# Patient Record
Sex: Female | Born: 1960 | Race: White | Hispanic: No | Marital: Married | Smoking: Former smoker
Health system: Southern US, Community
[De-identification: ages and names within clinical notes are randomized; demographics above are authoritative.]

## PROBLEM LIST (undated history)

## (undated) DIAGNOSIS — F419 Anxiety disorder, unspecified: Secondary | ICD-10-CM

## (undated) DIAGNOSIS — I639 Cerebral infarction, unspecified: Secondary | ICD-10-CM

## (undated) DIAGNOSIS — J449 Chronic obstructive pulmonary disease, unspecified: Secondary | ICD-10-CM

## (undated) DIAGNOSIS — C801 Malignant (primary) neoplasm, unspecified: Secondary | ICD-10-CM

## (undated) DIAGNOSIS — F329 Major depressive disorder, single episode, unspecified: Secondary | ICD-10-CM

## (undated) DIAGNOSIS — R112 Nausea with vomiting, unspecified: Secondary | ICD-10-CM

## (undated) DIAGNOSIS — I219 Acute myocardial infarction, unspecified: Secondary | ICD-10-CM

## (undated) DIAGNOSIS — I739 Peripheral vascular disease, unspecified: Secondary | ICD-10-CM

## (undated) DIAGNOSIS — D649 Anemia, unspecified: Secondary | ICD-10-CM

## (undated) DIAGNOSIS — R519 Headache, unspecified: Secondary | ICD-10-CM

## (undated) DIAGNOSIS — F32A Depression, unspecified: Secondary | ICD-10-CM

## (undated) DIAGNOSIS — K559 Vascular disorder of intestine, unspecified: Secondary | ICD-10-CM

## (undated) DIAGNOSIS — I6529 Occlusion and stenosis of unspecified carotid artery: Secondary | ICD-10-CM

## (undated) DIAGNOSIS — I2510 Atherosclerotic heart disease of native coronary artery without angina pectoris: Secondary | ICD-10-CM

## (undated) DIAGNOSIS — I209 Angina pectoris, unspecified: Secondary | ICD-10-CM

## (undated) DIAGNOSIS — K2970 Gastritis, unspecified, without bleeding: Secondary | ICD-10-CM

## (undated) DIAGNOSIS — I25810 Atherosclerosis of coronary artery bypass graft(s) without angina pectoris: Secondary | ICD-10-CM

## (undated) DIAGNOSIS — G939 Disorder of brain, unspecified: Secondary | ICD-10-CM

## (undated) DIAGNOSIS — M359 Systemic involvement of connective tissue, unspecified: Secondary | ICD-10-CM

## (undated) DIAGNOSIS — K219 Gastro-esophageal reflux disease without esophagitis: Secondary | ICD-10-CM

## (undated) DIAGNOSIS — R51 Headache: Secondary | ICD-10-CM

## (undated) DIAGNOSIS — B3781 Candidal esophagitis: Secondary | ICD-10-CM

## (undated) DIAGNOSIS — N189 Chronic kidney disease, unspecified: Secondary | ICD-10-CM

## (undated) DIAGNOSIS — K922 Gastrointestinal hemorrhage, unspecified: Secondary | ICD-10-CM

## (undated) DIAGNOSIS — I10 Essential (primary) hypertension: Secondary | ICD-10-CM

## (undated) DIAGNOSIS — IMO0001 Reserved for inherently not codable concepts without codable children: Secondary | ICD-10-CM

## (undated) DIAGNOSIS — K529 Noninfective gastroenteritis and colitis, unspecified: Secondary | ICD-10-CM

## (undated) HISTORY — DX: Atherosclerosis of coronary artery bypass graft(s) without angina pectoris: I25.810

## (undated) HISTORY — DX: Disorder of brain, unspecified: G93.9

## (undated) HISTORY — DX: Candidal esophagitis: B37.81

## (undated) HISTORY — PX: NOSE SURGERY: SHX723

## (undated) HISTORY — PX: ABDOMINAL HYSTERECTOMY: SHX81

## (undated) HISTORY — DX: Occlusion and stenosis of unspecified carotid artery: I65.29

## (undated) HISTORY — PX: CORONARY ARTERY BYPASS GRAFT: SHX141

## (undated) HISTORY — DX: Peripheral vascular disease, unspecified: I73.9

## (undated) HISTORY — PX: STENT PLACEMENT VASCULAR (ARMC HX): HXRAD1737

## (undated) HISTORY — DX: Vascular disorder of intestine, unspecified: K55.9

## (undated) HISTORY — PX: CORONARY ANGIOPLASTY: SHX604

## (undated) HISTORY — DX: Essential (primary) hypertension: I10

## (undated) HISTORY — DX: Gastritis, unspecified, without bleeding: K29.70

## (undated) HISTORY — DX: Nausea with vomiting, unspecified: R11.2

## (undated) HISTORY — DX: Gastrointestinal hemorrhage, unspecified: K92.2

## (undated) HISTORY — PX: BACK SURGERY: SHX140

## (undated) HISTORY — DX: Noninfective gastroenteritis and colitis, unspecified: K52.9

## (undated) LAB — HM MAMMOGRAPHY: HM Mammogram: NEGATIVE

## (undated) SURGERY — CAROTID PTA/STENT INTERVENTION
Anesthesia: Moderate Sedation | Laterality: Left

---

## 2005-10-10 NOTE — Unmapped (Signed)
Signed by Harless Litten DO on 10/10/2005 at 00:00:00  Urinalysis      Imported By: Evette Doffing 03/31/2009 10:55:29    _____________________________________________________________________    External Attachment:    Please see [employer] EMR for this document.

## 2006-06-15 NOTE — Unmapped (Signed)
Signed by Antonieta Loveless MD on 06/15/2006 at 00:00:00  [facility]      Imported By: Coletta Memos 08/01/2006 10:54:56    _____________________________________________________________________    External Attachment:    Please see [employer] EMR for this document.

## 2006-06-18 NOTE — Unmapped (Signed)
Signed by Harless Litten DO on 06/18/2006 at 00:00:00  EEG LABS AND EPILEPSY MONITORING UNIT       Imported By: Evette Doffing 04/12/2009 10:30:02    _____________________________________________________________________    External Attachment:    Please see [employer] EMR for this document.
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
## 2006-07-18 NOTE — Unmapped (Signed)
Signed by   LinkLogic on 07/25/2006 at 16:46:37  Patient: Tracie Romero  Note: All result statuses are Final unless otherwise noted.    Tests: (1)  (MR)    Order Note:                                      THE [facility]     PATIENT NAME:   Tracie Romero, Tracie Romero                MR #:  [id number]  DATE OF BIRTH:  January 22, 1961                         ACCOUNT #:  [id number]  VISIT DATE:     07/18/2006                         ROOM #:  5430  PRIMARY:        Helane Rima, D.O.            NURSING UNIT:  [id number]  REFERRING:      Jeani Hawking, M.D.               [facility]:  G  DICTATED BY:    Amy B. Renato Gails, M.D.                  ADMIT DATE:  07/17/2006  TECH INITIALS:  JR                                 DISCHARGE DATE:  07/20/2006  READ BY:        Amy B. Renato Gails, M.D.                                  CAROTID DUPLEX STUDY     INDICATIONS:  This is a 45 year old female with a history of CVA.     FINDINGS:  Brachial systolic pressure was 145 mmHg bilaterally.  Color duplex imaging of  the vertebral arteries reveals normal antegrade flow bilaterally.  B-mode  imaging of the carotid bifurcations reveals a moderate amount of plaque  formation bilaterally.  Spectral waveform analysis reveals severe spectral  broadening bilaterally.  Doppler velocimetric data analysis reveals a peak  systolic velocity of 153 cm/sec on the right and 269 cm/sec on the left with  end-diastolic velocities of 80 cm/sec on the right and 145 cm/sec on the left.     IMPRESSION:  1. Normal antegrade flow in the vertebral arteries bilaterally.  2. Low-end severe (50-79%) right internal carotid artery stenosis.  3. Critical (greater than 80-99%) left internal carotid artery stenosis based     on velocity criteria.  However, plaque formation on the left is moderate     in nature when the photos are reviewed.  4. Critical result called to Dr. Dorian Furnace on 07/18/06 at 8:24 p.m., read     back and verified.         ________________________________________  ABR/kbc                               ____  D:  07/24/2006 09:27  Amy B. Renato Gails, M.D.  T:  07/25/2006 16:32  Job #:  [id number]                                     CAROTID DUPLEX STUDY                                        COPY                   PAGE    1 of 1    Note: An exclamation mark (!) indicates a result that was not dispersed into   the flowsheet.  Document Creation Date: 07/25/2006 4:46 PM  _______________________________________________________________________    (1) Order result status: Final  Collection or observation date-time: 07/18/2006 00:00  Requested date-time:   Receipt date-time:   Reported date-time:   Referring Physician: Jeani Hawking  Ordering Physician:  Reviewed In Hospital [facility])  Specimen Source:   Source: DBS  Filler Order Number: [id number]  Lab site:
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
## 2006-07-20 NOTE — Unmapped (Signed)
Signed by Antonieta Loveless MD on 07/20/2006 at 00:00:00  Neurology      Imported By: Coletta Memos 08/01/2006 10:16:10    _____________________________________________________________________    External Attachment:    Please see [employer] EMR for this document.
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
## 2006-07-24 NOTE — Unmapped (Signed)
Signed by Tawanna Sat MA on 07/24/2006 at 16:11:40    Neurology Preload      Preload Clinical Lists   Medications added:   PLAVIX 75 MG TABS (CLOPIDOGREL BISULFATE) 1 qd  ASPIRIN 81 MG TBEC (ASPIRIN) 1 qd  EFFEXOR XR 75 MG CP24 (VENLAFAXINE HCL) 1 qd  PRINIVIL 10 MG TABS (LISINOPRIL) 1 qd  FOLIC ACID 1 MG TABS (FOLIC ACID) 1 qd  NEXIUM 40 MG CPDR (ESOMEPRAZOLE MAGNESIUM) 1 qd  ISOSORBIDE MONONITRATE CR 60 MG TB24 (ISOSORBIDE MONONITRATE) 1 qd  AMLODIPINE BESYLATE 5 MG TABS (AMLODIPINE BESYLATE) 1 qd  ROXICODONE 5 MG TABS (OXYCODONE HCL) prn  XANAX 1 MG TABS (ALPRAZOLAM) 1 qd        Coordinating Care Providers   PCP Name: Ian Bushman, MD    Past History  Social History: Marital Status: married,   Employment Status: disabled,   Patient Lives at: home,   Support System: excellent  Caffeine per Day: 4+  Seatbelt Use: 100 % of the time  Alcohol Use: none  Drug Use: none  Tobacco Usage:smoker  Cigarettes-Packs per Day- .5,             ]

## 2006-07-25 NOTE — Unmapped (Signed)
THE [facility]     PATIENT NAME:   Tracie Romero, Tracie Romero                MR #:  [id number]   DATE OF BIRTH:  07-17-1961                         ACCOUNT #:  [id number]   VISIT DATE:     07/18/2006                         ROOM #:  [id number]   PRIMARY:        Helane Rima, D.O.            NURSING UNIT:  U5NW   REFERRING:      Jeani Hawking, M.D.               [facility]:  G   DICTATED BY:    Dakhari Zuver B. Renato Gails, M.D.                  ADMIT DATE:  07/17/2006   TECH INITIALS:  JR                                 DISCHARGE DATE:    07/20/2006   READ BY:        Mandrell Vangilder B. Renato Gails, M.D.                                  CAROTID DUPLEX STUDY     INDICATIONS:   This is a 45 year old female with a history of CVA.     FINDINGS:   Brachial systolic pressure was 145 mmHg bilaterally.  Color duplex imaging of   the vertebral arteries reveals normal antegrade flow bilaterally.  B-mode   imaging of the carotid bifurcations reveals a moderate amount of plaque   formation bilaterally.  Spectral waveform analysis reveals severe spectral   broadening bilaterally.  Doppler velocimetric data analysis reveals a peak   systolic velocity of 153 cm/sec on the right and 269 cm/sec on the left with   end-diastolic velocities of 80 cm/sec on the right and 145 cm/sec on the   left.     IMPRESSION:   1. Normal antegrade flow in the vertebral arteries bilaterally.   2. Low-end severe (50-79%) right internal carotid artery stenosis.   3. Critical (greater than 80-99%) left internal carotid artery stenosis based      on velocity criteria.  However, plaque formation on the left is moderate      in nature when the photos are reviewed.   4. Critical result called to Dr. Dorian Furnace on 07/18/06 at 8:24 p.m., read      back and verified.                                               ________________________________________   ABR/kbc                               ____   D:  07/24/2006 09:27  Khai Arrona B. Renato Gails, M.D.   T:  07/25/2006  16:32   Job #:  [id number]                                    CAROTID DUPLEX STUDY                                         COPY                   PAGE    1 of 1   D:  07/24/2006 09:27                  Edenilson Austad B. Renato Gails, M.D.   T:  07/25/2006 16:32   Job #:  [id number]                                    CAROTID DUPLEX STUDY                                         COPY                   PAGE    1 of 1
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
## 2006-07-27 ENCOUNTER — Inpatient Hospital Stay: Attending: Surgical Critical Care

## 2006-07-31 NOTE — Unmapped (Signed)
Signed by Antonieta Loveless MD on 07/31/2006 at 00:00:00  Transferred Records       Imported By: Coletta Memos 07/31/2006 14:41:01    _____________________________________________________________________    External Attachment:    Please see [employer] EMR for this document.
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
## 2006-08-10 NOTE — Unmapped (Signed)
Signed by Colbert Ewing MD on 08/10/2006 at 00:00:00  [facility]      Imported By: Maryellen Pile 09/13/2006 11:55:11    _____________________________________________________________________    External Attachment:    Please see [facility] EMR for this document.

## 2006-08-17 ENCOUNTER — Inpatient Hospital Stay: Attending: Vascular Surgery

## 2006-08-17 NOTE — Unmapped (Signed)
Signed by   LinkLogic on 08/18/2006 at 12:30:24  Patient: Fiza Swantek  Note: All result statuses are Final unless otherwise noted.    Tests: (1)  (MR)    Order Note:                                      THE [facility]     PATIENT NAME:   ARDENA, GANGL                MR #:  [id number]  DATE OF BIRTH:  1961-03-09                         ACCOUNT #:  [id number]  VISIT DATE:     08/17/2006                         ROOM #:  PRIMARY:        Helane Rima, D.O.            NURSING UNIT:  REFERRING:      Amy B. Renato Gails, M.D.                  [name]  DICTATED BY:    Denny Peon, M.D.             ADMIT DATE:  08/17/2006  TECH INITIALS:  DA/CP                              DISCHARGE DATE:  READ BY:        Denny Peon, M.D.                                  CAROTID DUPLEX STUDY     HISTORY:  This is a 45 year old woman with bilateral carotid stenosis.     FINDINGS:  Brachial pressure is 122 mmHg on the right and 119 mmHg on the  left.     B-mode ultrasound reveals a large amount of nonechogenic plaque in both  carotid bulbs and proximal internal carotid arteries.  Doppler flow analysis  demonstrates a peak systolic velocity of 103 cm/second with an end-diastolic  velocity of 46 cm/second and severe spectral broadening in the right internal  carotid artery.  In the left internal carotid artery the peak systolic  velocity is 175 cm/second with an end-diastolic velocity of 86 cm/second and  severe spectral broadening. There is antegrade flow in both vertebral  arteries.     IMPRESSION:     1. Moderate right internal carotid artery stenosis in the 16-49% range.  2. Severe left internal carotid artery stenosis in the 50-79% range.                                              ________________________________________  JSG/kll                               ____  D:  08/17/2006 15:32  Denny Peon, M.D.  T:  08/18/2006 12:27  Job #:  [id number]                                     CAROTID DUPLEX  STUDY                                        COPY                   PAGE    1 of 1    Note: An exclamation mark (!) indicates a result that was not dispersed into   the flowsheet.  Document Creation Date: 08/18/2006 12:30 PM  _______________________________________________________________________    (1) Order result status: Final  Collection or observation date-time: 08/17/2006 00:00  Requested date-time:   Receipt date-time:   Reported date-time:   Referring Physician: Colbert Ewing  Ordering Physician:  Reviewed In Hospital [facility])  Specimen Source:   Source: DBS  Filler Order Number: [id number]  Lab site:

## 2006-08-18 NOTE — Unmapped (Signed)
THE [facility]     PATIENT NAME:   Tracie Romero, Tracie Romero                MR #:  [id number]   DATE OF BIRTH:  June 26, 1961                         ACCOUNT #:  [id number]   VISIT DATE:     08/17/2006                         ROOM #:   PRIMARY:        Helane Rima, D.O.            NURSING UNIT:   REFERRING:      Amy B. Renato Gails, M.D.                  [name]   DICTATED BY:    Denny Peon, M.D.             ADMIT DATE:  08/17/2006   TECH INITIALS:  DA/CP                              DISCHARGE DATE:   READ BY:        Denny Peon, M.D.                                  CAROTID DUPLEX STUDY     HISTORY:  This is a 45 year old woman with bilateral carotid stenosis.     FINDINGS:  Brachial pressure is 122 mmHg on the right and 119 mmHg on the   left.     B-mode ultrasound reveals a large amount of nonechogenic plaque in both   carotid bulbs and proximal internal carotid arteries.  Doppler flow analysis   demonstrates a peak systolic velocity of 103 cm/second with an end-diastolic   velocity of 46 cm/second and severe spectral broadening in the right internal   carotid artery.  In the left internal carotid artery the peak systolic   velocity is 175 cm/second with an end-diastolic velocity of 86 cm/second and   severe spectral broadening. There is antegrade flow in both vertebral   arteries.     IMPRESSION:     1. Moderate right internal carotid artery stenosis in the 16-49% range.   2. Severe left internal carotid artery stenosis in the 50-79% range.                                               ________________________________________   JSG/kll                               ____   D:  08/17/2006 15:32                  Denny Peon, M.D.   T:  08/18/2006 12:27   Job #:  [id number]                                    CAROTID  DUPLEX STUDY                                         COPY                   PAGE    1 of 1                                         COPY                   PAGE    1 of  1

## 2006-08-30 NOTE — Unmapped (Signed)
Signed by Wende Bushy LPN on 16/07/9603 at 16:30:48    General Preload      Preload Clinical Lists   Problems added:   HYPERLIPIDEMIA (ICD-272.4)  CAROTID ARTERY DISEASE (ICD-433.10)  HYPERTENSION (ICD-401.9)  DEPRESSION (ICD-311)  CAD (ICD-414.00)  HEADACHE (ICD-784.0)  SPELLS (ICD-780.39)    Medications added:   PLAVIX 75 MG TABS (CLOPIDOGREL BISULFATE) 1 qd  ASPIRIN 81 MG TBEC (ASPIRIN) 1 qd  EFFEXOR XR 75 MG CP24 (VENLAFAXINE HCL) 1 qd  PRINIVIL 10 MG TABS (LISINOPRIL) 1 qd  FOLIC ACID 1 MG TABS (FOLIC ACID) 1 qd  NEXIUM 40 MG CPDR (ESOMEPRAZOLE MAGNESIUM) 1 qd  ISOSORBIDE MONONITRATE CR 60 MG TB24 (ISOSORBIDE MONONITRATE) 1 qd  AMLODIPINE BESYLATE 5 MG TABS (AMLODIPINE BESYLATE) 1 qd  XANAX 1 MG TABS (ALPRAZOLAM) 1 qd  LIPITOR 80 MG TABS (ATORVASTATIN CALCIUM) daily  TRICOR 145 MG TABS (FENOFIBRATE) qd  ZETIA 10 MG TABS (EZETIMIBE) daily  NITROGLYCERIN SUBL (NITROGLYCERIN SUBL) prn      Observations Recorded during this update  Added new observation of SOCIAL HX: Marital Status: married,   Children: 2,   Employment Status: disabled,   Patient Lives at: home,   Support System: excellent  Caffeine per Day: 4+  Seatbelt Use: 100 % of the time  Alcohol Use: none  Drug Use: none  Tobacco Usage:smoker  Cigarettes-Packs per Day- .5,    (08/30/2006 16:21)  Added new observation of # CHILDREN: 2  (08/30/2006 16:21)  Added new observation of PAST SURG HX:  Coronary Stent: x6, 1 stent in left arm      (08/30/2006 16:21)  Added new observation of CARDSRGOTHER: 1 stent in left arm  (08/30/2006 16:21)  Added new observation of INTRCORSTENT: x6  (08/30/2006 16:21)  Added new observation of PAST MED HX:  Coronary Artery Disease, Hyperlipidemia, Hypertension, Depression    (08/30/2006 16:21)  Added new observation of PSYCH HX: Depression  (08/30/2006 16:21)  Added new observation of PMH CARD: Coronary Artery Disease, Hyperlipidemia, Hypertension  (08/30/2006 16:21)  Added new observation of NKA: T  (08/30/2006  16:21)  Past History  Past Medical History:  Coronary Artery Disease, Hyperlipidemia, Hypertension, Depression  Surgical History:  Coronary Stent: x6, 1 stent in left arm    Social History: Marital Status: married,   Children: 2,   Employment Status: disabled,   Patient Lives at: home,   Support System: excellent  Caffeine per Day: 4+  Seatbelt Use: 100 % of the time  Alcohol Use: none  Drug Use: none  Tobacco Usage:smoker  Cigarettes-Packs per Day- .5,       OTHER PHYSICIANS INVOLVED IN PATIENT CARE   PCP Name: Ian Bushman, MD  Cardiologist: Dr. Matthias Hughs      [facility] Rectal Coordinating Care Providers:   PCP: Ian Bushman, MD

## 2006-09-07 NOTE — Unmapped (Signed)
Signed by Colbert Ewing MD on 09/07/2006 at 23:58:03    VASCULAR SURGERY FOLLOW-UP VISIT    Chief Complaint: F/U Carotid Stenosis    History of Present Illness   Stable from traumatic splenic injury. Had CTA and carotid duplex for questionable high grade carotid stenosis on the left. Denies stroke, amaurosis, TIA. Has constant numbness of R arm and headaches. Legs ache at times with sitting long periods of time.      Past History  Social History: Marital Status: married,   Children: 2,   Employment Status: disabled,   Patient Lives at: home,   Support System: excellent  Caffeine per Day: 4+  Seatbelt Use: 100 % of the time  Alcohol Use: none  Drug Use: none  Tobacco Usage:smoker  Cigarettes-Packs per Day- .5,     Review of Systems   General: Complains of daytime somnolence, not resting well, sweats. Denies anorexia, chills, fatigue, fevers, malaise, morning fatigue, weight loss, weight gain. insomnia  Cardiac: Complains of dizziness, dyspnea on exertion, dyspnea at rest, palpitations, presyncope. Denies chest pains, peripheral edema, PND, orthopnea.     Medications   PLAVIX 75 MG TABS (CLOPIDOGREL BISULFATE) 1 qd  ASPIRIN 81 MG TBEC (ASPIRIN) 1 qd  EFFEXOR XR 75 MG CP24 (VENLAFAXINE HCL) 1 qd  PRINIVIL 10 MG TABS (LISINOPRIL) 1 qd  FOLIC ACID 1 MG TABS (FOLIC ACID) 1 qd  NEXIUM 40 MG CPDR (ESOMEPRAZOLE MAGNESIUM) 1 qd  ISOSORBIDE MONONITRATE CR 60 MG TB24 (ISOSORBIDE MONONITRATE) 1 qd  AMLODIPINE BESYLATE 5 MG TABS (AMLODIPINE BESYLATE) 1 qd  XANAX 0.5 MG TABS (ALPRAZOLAM) one by mouth at bedtime  LIPITOR 80 MG TABS (ATORVASTATIN CALCIUM) daily  TRICOR 145 MG TABS (FENOFIBRATE) qd  ZETIA 10 MG TABS (EZETIMIBE) daily  NITROGLYCERIN SUBL (NITROGLYCERIN SUBL) 0.4mg  as needed    Allergies  No Known Allergies    Dominant Hand: Right    Vital Signs   Height: 61 in.  Weight: 164 lbs.   Blood Pressure   BP #1: 122 / 80mm Hg  Cuff Size: Std     Intake recorded by: Earlie Counts LPN  September 07, 2006 11:13 AM      Physical  Examination  Neck: supple, no adenopathy, no masses, normal carotid pulses, left carotid bruit.   Carotid Pulse- Right: normal.   Carotid Pulse- Left: normal.   Radial Pulse- Left: normal.   Brachial Pulse- Right: normal.   Femoral Pulse- Right: diminished.   Femoral Pulse- Left: diminished.   Posterior Tibial Pulse- Right: normal.   Posterior Tibial Pulse- Left: normal.   Dorsalis Pedis Pulse- Right: normal.   Dorsalis Pedis Pulse- Left: normal.   Neuro: normal, cranial nerves II-XII intact, sensation intact, motor intact.   Psych: affect and mood appropriate, normal interaction.    Doppler flow analysis  demonstrates a peak systolic velocity of 103 cm/second with an end-diastolic  velocity of 46 cm/second and severe spectral broadening in the right internal  carotid artery.  In the left internal carotid artery the peak systolic  velocity is 175 cm/second with an end-diastolic velocity of 86 cm/second and  severe spectral broadening. There is antegrade flow in both vertebral  arteries.    Assessment and Plan   45 y/o female with carotid stenosis of 50% on L and 30% on R. Continue ECASA and statin. Given her premature atherosclerosis and strong family history of CAD, I have emphasized complete tobacco cessation. Will check carotid duplex and exercise ABIs in 6 months.  Plan  Orders for today's visit  1. Ordered 99214 - Ofc Vst, Est Level IV [CPT-99214]    Additional Plan  exercise LEA in 6 months  Carotid in 6 months    DISPOSITION:    Return to clinic in 6 month(s)     cc:   Ian Bushman, MD  Jac[facility] Loge  Dr. Marda Stalker

## 2006-09-18 NOTE — Unmapped (Signed)
Signed by Colbert Ewing MD on 09/18/2006 at 00:00:00  Outside Medical Records      Imported By: Maryellen Pile 09/18/2006 09:15:20    _____________________________________________________________________    External Attachment:    Please see [employer] EMR for this document.

## 2006-10-02 NOTE — Unmapped (Addendum)
Signed by Tracie Romero on 10/03/2006 at 13:44:29        [facility]      ,     [phone]  Fax: [phone]             September 07, 2006                RE: Tracie Romero   DOB:  1961-03-09      Dear Tracie Romero, M.D.,    I had the pleasure of seeing your patient, Tracie Romero, at your request for surgical evaluation in our Vascular Surgery office. Please see my Assessment and Plan below as well as the attached office note.    46 y/o female with carotid stenosis of 50% on L and 30% on R. Continue ECASA and statin. Given her premature atherosclerosis and strong family history of CAD, I have emphasized complete tobacco cessation. Will check carotid duplex and exercise ABIs in 6 months.    Thank you for allowing me to see this patient.  I will keep you informed of their progress.    Best personal regards,        Tracie Bath B. Renato Gails, MD, FACS  Assistant Professor of Surgery  Division of Vascular Surgery  [email or web address]        CC  Tracie Bushman, MD  Tracie Romero  Tracie Romero      Signed by Tracie Romero on 10/03/2006 at 13:46:59    Faxed letter to Dr. Boone Master, Dr. Matthias Hughs, and Dr. Orlin Hilding

## 2006-10-02 NOTE — Unmapped (Signed)
Signed by Gershon Crane on 10/03/2006 at 13:44:29        [facility]      ,     [phone]  Fax: [phone]           September 07, 2006                RE: Tracie Romero   DOB:  October 21, 1960      Dear Sherilyn Dacosta, MD,    I had the pleasure of seeing your patient, Tracie Romero, at your request for surgical evaluation in our Vascular Surgery office. Please see my Assessment and Plan below as well as the attached office note.    46 y/o female with carotid stenosis of 50% on L and 30% on R. Continue ECASA and statin. Given her premature atherosclerosis and strong family history of CAD, I have emphasized complete tobacco cessation. Will check carotid duplex and exercise ABIs in 6 months.    Thank you for allowing me to see this patient.  I will keep you informed of their progress.    Best personal regards,        Weronika Birch B. Renato Gails, MD, FACS  Assistant Professor of Surgery  Division of Vascular Surgery  [email or web address]        CC  Ian Bushman, MD  [facility]  Dr. Marda Stalker

## 2006-10-08 NOTE — Unmapped (Signed)
Signed by Colbert Ewing MD on 10/08/2006 at 00:00:00  [facility]      Imported By: Maryellen Pile 11/08/2006 16:37:25    _____________________________________________________________________    External Attachment:    Please see [employer] EMR for this document.

## 2006-10-24 NOTE — Unmapped (Signed)
Signed by Harless Litten DO on 10/24/2006 at 00:00:00  [facility]       Imported By: Evette Doffing 06/11/2009 13:01:22    _____________________________________________________________________    External Attachment:    Please see [employer] EMR for this document.

## 2006-11-07 NOTE — Unmapped (Signed)
Signed by Colbert Ewing MD on 11/07/2006 at 00:00:00  [facility]      Imported By: Coletta Memos 12/26/2006 09:51:33    _____________________________________________________________________    External Attachment:    Please see [employer] EMR for this document.

## 2006-12-24 NOTE — Unmapped (Signed)
Signed by   [employer]Log[employer] on 12/25/2006 at 13:24:10  Patient: Tracie Romero  Note: All result statuses are Final unless otherwise noted.    Tests: (1) CT-HEAD W/O CONTRAST ([id number])    Order NotePricilla Handler Order Number: [id number]    Non-EMR Ordering Provider: Darol Destine     Order Note:     *** VERIFIED ***  [facility]  Reason:  SLURRED SPEECH, CHEST PAIN, BACK PAIN, R/O DISSECTION  Dict.Staff: [address] [place] [id number]    Verified By: Candis Shine D        Ver: 12/25/06   1:23 pm  Exams:  CT-HEAD W/O CONTRAST  CT-CHEST W/WO CONTRAST      Dictated 12/24/06 at 13:30    12/24/06  CT OF THE BRAIN WITHOUT CONTRAST:    HISTORY:  Slurred speech.    COMPARISON:  10/26/06 question intracranial hemorrhage.    Images obtained through the brain without intravenous contrast.  The ventricles and basilar cisterns are within normal limits.  There is no acute intraaxial or extraaxial hemorrhage.    IMPRESSION:    NO ACUTE  INTRAAXIAL OR EXTRAAXIAL HEMORRHAGE.      CT OF THE CHEST:    HISTORY:  Back pain, question dissection.    COMPARISON:  CTPA chest 10/27/06.    Images obtained of the chest without and with intravenous  contrast, 100 mL of Isovue-370 was administered intravenously.    There is a stent within the left subclavian artery.    There is no intramural hematoma on the noncontrasted images.    Post administration of contrast demonstrates no aneurysm of the  thoracic aorta.  There is no evidence for dissection.  There is  some mild calcification of the thoracic aorta.  The left  subclavian artery is grossly patent.    There is severe hilar or mediastinal lymphadenopathy.  There are  no pericardial or pleural effusions.    Thyroid gland is unremarkable.    There is dependent bibasilar atelectasis.    There are some mild emphysematous changes most pronounced in the  upper lungs.    There is no focal consolidation.    There is retained content which is radiodense within the  stomach, indeterminate and is new since the CT  examination  10/27/06.    IMPRESSION:    1.  NO EVIDENCE FOR ACUTE AORTIC SYNDROME.    2.  NO FOCAL CONSOLIDATION.      /pab  **** end of result ****    Note: An exclamation mark (!) indicates a result that was not dispersed into   the flowsheet.  Document Creation Date: 12/25/2006 1:24 PM  _______________________________________________________________________    (1) Order result status: Final  Collection or observation date-time: 12/24/2006 13:18:05  Requested date-time: 12/24/2006 12:49:00  Receipt date-time:   Reported date-time: 12/25/2006 13:23:47  Referring Physician: Sharmon Leyden  Ordering Physician:  Non-EMR Physician [facility])  Specimen Source:   Source: QRS  Filler Order Number: [id number]  Lab site: [facility]

## 2006-12-24 NOTE — Unmapped (Signed)
Signed by   LinkLogic on 12/25/2006 at 07:02:45  Patient: Tracie Romero  Note: All result statuses are Final unless otherwise noted.    Tests: (1) DIAG-CHEST PA OR AP [phone])    Order NotePricilla Handler Order Number: [id number]    Non-EMR Ordering Provider: Darol Destine     Order Note:     *** VERIFIED ***  [facility]  Reason:  CP  Dict.Staff: Tammi Sou [phone]    Verified By: Tammi Sou       Ver: 12/25/06   7:02 am  Exams:  DIAG-CHEST PA OR AP        Dictated 12/24/06 at 11:59    12/24/06  PORTABLE AP CHEST AT 1140 HOURS:    CLINICAL HISTORY:  Shortness of breath and chest pain.    Comparison study is 10/26/06.    No pneumothorax.  Heart, hila, and mediastinum nonremarkable.  Lungs are clear.  No CHF.    IMPRESSION:    NORMAL EXAM.    /ldm  **** end of result ****    Note: An exclamation mark (!) indicates a result that was not dispersed into   the flowsheet.  Document Creation Date: 12/25/2006 7:02 AM  _______________________________________________________________________    (1) Order result status: Final  Collection or observation date-time: 12/24/2006 11:48:00  Requested date-time: 12/24/2006 11:41:00  Receipt date-time:   Reported date-time: 12/25/2006 07:02:33  Referring Physician: Sharmon Leyden  Ordering Physician:  Non-EMR Physician [facility])  Specimen Source:   Source: QRS  Filler Order Number: [id number]  Lab site: [facility]

## 2006-12-24 NOTE — Unmapped (Signed)
Signed by   LinkLogic on 12/24/2006 at 15:07:48  Patient: Tracie Romero  Note: All result statuses are Final unless otherwise noted.    Tests: (1)  (MR)    Order Note:                                     [facility]     PATIENT NAME:   LEZLEE, GILLS               MR #:  [id number]  DATE OF BIRTH:  09-23-1961                        ACCOUNT #:  [id number]  ED PHYSICIAN:   Samuel Bouche, M.D.               ROOM #:  [id number]  PRIMARY:        Helane Rima, D.O.           NURSING UNIT:  [facility]  REFERRING:      Narda Amber, M.D.   [facility]:  C  DICTATED BY:    Samuel Bouche, M.D.               ADMIT DATE:  12/24/2006  VISIT DATE:                                       DISCHARGE DATE:                           EMERGENCY DEPARTMENT ADMISSION NOTE     CHIEF COMPLAINT:  Chest pain, slurred speech.     HISTORY OF PRESENT ILLNESS:  This is a 46 year old female with known history  of cardiac disease as well as known history of carotid stenosis and a history  of TIAs who presents with the above complaints.  The patient reports chest  pain, dull, substernal, radiating to her back, 10/10, only relieved with  morphine, not associated with fevers or chills.  The patient has also had  intermittent and slurred speech.  She reports she is not sure what brought  that on.  She has been off her medications for some period of time.  She has  not followed up with the doctor since last discharge as advised.     PAST MEDICAL HISTORY:     1. History of cardiac disease.  2. TIAs.  3. Carotid stenosis.  4. Hypertension.  5. Seizure.     ALLERGIES:  None.     MEDICATIONS:  Supposed to include:     1. Aspirin.  2. Plavix.  3. Isosorbide.     SOCIAL HISTORY:  The patient denies tobacco, alcohol, or drugs acutely.     REVIEW OF SYSTEMS:  CONSTITUTIONAL:  No fevers.  CARDIOVASCULAR:  Positive  for chest pain.  PULMONARY:  No shortness of breath.  GI:  No abdominal pain.  NEUROLOGIC:  Positive for slurred speech.  All other systems  per HPI and  otherwise negative.     PHYSICAL EXAMINATION:     VITAL SIGNS:  Blood pressure 164/91, pulse 100, respiratory rate 22,  temperature 98.9.  O2 sat 99%.  GENERAL:  The patient appeared in pain but in no distress.  HEENT:  Head:  No  signs of trauma.  Eyes:  Pupils 4:2.  TMs are clear.  Oropharynx clear.  NECK:  Nontender to palpation.  PULMONARY:  Lungs clear to auscultation bilaterally.  CARDIOVASCULAR:  Heart S1, S2, regular rate and rhythm.  ABDOMEN:  Soft, nontender, positive bowel sounds.  No rebound or guarding.  NEUROLOGIC:  Cranial nerves intact.  Motor was intact.  Gait was normal.  SKIN:  No acute lesions.  EXTREMITIES:  No edema.  VASCULAR:  Brisk pulses.  PSYCHIATRIC:  Normal mood and affect.     DIAGNOSTIC TESTING:     EKG:  Indication was chest pain.  Read by me.  Showed sinus tachycardia, rate  of 102, PR interval 135, QRS of 90, QTC of 340, axis of -10, normal R-wave  progression.  No acute ST or T-wave abnormalities appreciated.  As compared  to previous EKG it is unchanged.  Interpretation is nondiagnostic.     LABORATORY DATA:  Significant for negative cardiac biomarkers.     RADIOLOGY:  CT of the head and chest showed no signs of acute stroke and no  signs of dissection.     EMERGENCY DEPARTMENT COURSE:  The patient received morphine for pain, aspirin  for stroke and heart prophylaxis after treatments were back.  Patient  received potassium for low potassium.  The patient had imaging studies done  as indicated by her history to rule out dissection and acute stroke.     MEDICAL DECISION MAKING:  This is a 46 year old female who presents with a  variety of symptoms including slurred speech, chest pain and pain radiating  to the back, all of which could be consistent with an aortic dissection.  As  such, a dissection protocol was initiated.  That has been ruled out and the  patient persists with chest pain and neurologic complaints.  The patient has  a history of cardiac disease and will  need serial rule out and subspecialty  consultation.  She also has a history of stroke and may just need to be  restarted on aspirin, given the fact that she is having these symptoms while  off therapy, but she also has a history of carotid stenosis, so if her  symptoms persist while on therapy endarterectomy should be considered.  At  this time is noncompliant.  The patient presents to the ED with multiple  complaints.  She does not appear to have anything acute that requires  surgical or interventional and can be deferred to inpatient consultation.     IMPRESSION:     1. Chest pain.  2. TIA.     DISPOSITION:  Admit to hospital.     CRITICAL CARE:  Critical care time to rule out dissection and acute stroke in  this patient was 35 minutes.                                                             ______________________________________  AB/trw                                ______  D:  12/24/2006 13:56                  Samuel Bouche, M.D.  T:  12/24/2006 14:30  Job #:  [id number]                                 EMERGENCY DEPARTMENT ADMISSION NOTE                                       COPY                    PAGE    1 of   1    Note: An exclamation mark (!) indicates a result that was not dispersed into   the flowsheet.  Document Creation Date: 12/24/2006 3:07 PM  _______________________________________________________________________    (1) Order result status: Final  Collection or observation date-time: 12/24/2006 00:00  Requested date-time:   Receipt date-time:   Reported date-time:   Referring Physician: Sharmon Leyden  Ordering Physician:  Reviewed In Hospital [facility])  Specimen Source:   Source: DBS  Filler Order Number: [id number]  Lab site:

## 2006-12-24 NOTE — Unmapped (Signed)
Signed by   LinkLogic on 12/25/2006 at 13:24:11  Patient: Tracie Romero  Note: All result statuses are Final unless otherwise noted.    Tests: (1) CT-CHEST W/WO CONTRAST [phone])    Order NotePricilla Handler Order Number: [id number]    Non-EMR Ordering Provider: Darol Destine     Order Note:     *** VERIFIED ***  [facility]  Reason:  SLURRED SPEECH, CHEST PAIN, BACK PAIN, R/O DISSECTION  Dict.Staff: [address], [place] [id number]    Verified By: Candis Shine D        Ver: 12/25/06   1:23 pm  Exams:  CT-HEAD W/O CONTRAST  CT-CHEST W/WO CONTRAST      Dictated 12/24/06 at 13:30    12/24/06  CT OF THE BRAIN WITHOUT CONTRAST:    HISTORY:  Slurred speech.    COMPARISON:  10/26/06 question intracranial hemorrhage.    Images obtained through the brain without intravenous contrast.  The ventricles and basilar cisterns are within normal limits.  There is no acute intraaxial or extraaxial hemorrhage.    IMPRESSION:    NO ACUTE  INTRAAXIAL OR EXTRAAXIAL HEMORRHAGE.      CT OF THE CHEST:    HISTORY:  Back pain, question dissection.    COMPARISON:  CTPA chest 10/27/06.    Images obtained of the chest without and with intravenous  contrast, 100 mL of Isovue-370 was administered intravenously.    There is a stent within the left subclavian artery.    There is no intramural hematoma on the noncontrasted images.    Post administration of contrast demonstrates no aneurysm of the  thoracic aorta.  There is no evidence for dissection.  There is  some mild calcification of the thoracic aorta.  The left  subclavian artery is grossly patent.    There is severe hilar or mediastinal lymphadenopathy.  There are  no pericardial or pleural effusions.    Thyroid gland is unremarkable.    There is dependent bibasilar atelectasis.    There are some mild emphysematous changes most pronounced in the  upper lungs.    There is no focal consolidation.    There is retained content which is radiodense within the  stomach, indeterminate and is new since the  CT examination  10/27/06.    IMPRESSION:    1.  NO EVIDENCE FOR ACUTE AORTIC SYNDROME.    2.  NO FOCAL CONSOLIDATION.      /pab  **** end of result ****    Note: An exclamation mark (!) indicates a result that was not dispersed into   the flowsheet.  Document Creation Date: 12/25/2006 1:24 PM  _______________________________________________________________________    (1) Order result status: Final  Collection or observation date-time: 12/24/2006 13:18:11  Requested date-time: 12/24/2006 12:49:00  Receipt date-time:   Reported date-time: 12/25/2006 13:23:47  Referring Physician: Sharmon Leyden  Ordering Physician:  Non-EMR Physician [facility])  Specimen Source:   Source: QRS  Filler Order Number: [id number]  Lab site: [facility]

## 2006-12-25 NOTE — Unmapped (Signed)
Signed by   LinkLogic on 12/27/2006 at 07:05:23  Patient: Tracie Romero  Note: All result statuses are Final unless otherwise noted.    Tests: (1) NM-MYO PERF WALL MOTION ADD [phone])    Order NotePricilla Handler Order Number: [id number]    Non-EMR Ordering Provider: Ann Held, JEFFREY D     Order Note:     *** VERIFIED ***  [facility]  Reason:  C/P  Dict.Staff: Dierdre Highman [phone]    Verified By: Dierdre Highman      Ver: 12/27/06   7:05 am  Exams:  NM-MYOCARDIAL PERF MULT SPECT  NM-MYO PERF WALL MOTION ADD      Dictated 12/25/06 at 16:23    12/25/06  MYOCARDIAL STRESS AND REST IMAGING:    COMPARISON:  None.    INDICATION:  Chest pain.    Bolus of 42.1 mg of adenosine was administered for stress  imaging.  Maximum heart rate was 109 beats per minute.  Maximum  blood pressure is 140/90.  [employer] included 26.4  mCi of technetium Myoview for stress imaging and 4.12 mCi of  thallium-201 at rest.    Stress and rest images are mildly inhomogeneous.  Mild anterior  soft tissue attenuation is evident.  No definite stress-induced  ischemia is identified.  No wall motion abnormality is evident.  Ejection fraction is estimated at 71%.    IMPRESSION:    NO EVIDENCE OF STRESS-INDUCED REVERSIBLE MYOCARDIAL ISCHEMIA.    /pab  **** end of result ****    Note: An exclamation mark (!) indicates a result that was not dispersed into   the flowsheet.  Document Creation Date: 12/27/2006 7:05 AM  _______________________________________________________________________    (1) Order result status: Final  Collection or observation date-time: 12/25/2006 15:00:00  Requested date-time: 12/25/2006 14:15:00  Receipt date-time:   Reported date-time: 12/27/2006 07:05:08  Referring Physician: Sharmon Leyden  Ordering Physician:  Non-EMR Physician [facility])  Specimen Source:   Source: QRS  Filler Order Number: [id number]  Lab site: [facility]
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
## 2006-12-25 NOTE — Unmapped (Signed)
Signed by   LinkLogic on 12/27/2006 at 07:05:22  Patient: Tracie Romero  Note: All result statuses are Final unless otherwise noted.    Tests: (1) NM-MYOCARDIAL PERF MULT SPECT ([id number])    Order NotePricilla Handler Order Number: [id number]    Non-EMR Ordering Provider: Ann Held, JEFFREY D     Order Note:     *** VERIFIED ***  [facility]  Reason:  C/P  Dict.Staff: Dierdre Highman [phone]    Verified By: Dierdre Highman      Ver: 12/27/06   7:05 am  Exams:  NM-MYOCARDIAL PERF MULT SPECT  NM-MYO PERF WALL MOTION ADD      Dictated 12/25/06 at 16:23    12/25/06  MYOCARDIAL STRESS AND REST IMAGING:    COMPARISON:  None.    INDICATION:  Chest pain.    Bolus of 42.1 mg of adenosine was administered for stress  imaging.  Maximum heart rate was 109 beats per minute.  Maximum  blood pressure is 140/90.  [employer] included 26.4  mCi of technetium Myoview for stress imaging and 4.12 mCi of  thallium-201 at rest.    Stress and rest images are mildly inhomogeneous.  Mild anterior  soft tissue attenuation is evident.  No definite stress-induced  ischemia is identified.  No wall motion abnormality is evident.  Ejection fraction is estimated at 71%.    IMPRESSION:    NO EVIDENCE OF STRESS-INDUCED REVERSIBLE MYOCARDIAL ISCHEMIA.    /pab  **** end of result ****    Note: An exclamation mark (!) indicates a result that was not dispersed into   the flowsheet.  Document Creation Date: 12/27/2006 7:05 AM  _______________________________________________________________________    (1) Order result status: Final  Collection or observation date-time: 12/25/2006 15:00:00  Requested date-time: 12/25/2006 14:15:00  Receipt date-time:   Reported date-time: 12/27/2006 07:05:08  Referring Physician: Sharmon Leyden  Ordering Physician:  Non-EMR Physician [facility])  Specimen Source:   Source: QRS  Filler Order Number: [id number]  Lab site: [facility]

## 2006-12-25 NOTE — Unmapped (Signed)
Signed by   LinkLogic on 12/27/2006 at 07:32:36  Patient: Tracie Romero  Note: All result statuses are Final unless otherwise noted.    Tests: (1) VASCI-CAROTID DUPLEX COMP BIL ([id number])    Order NotePricilla Handler Order Number: [id number]    Order Note:     *** VERIFIED [facility]  Reason:  tia  Dict.Staff: Harley Alto [id number]    Verified By: Harley Alto          Ver: 12/27/06   7:32 am  Exams:  VASCI-CAROTID DUPLEX COMP BIL      Dictated 12/26/06 at 08:23    12/25/06  CAROTID DUPLEX:    INDICATION:  Carotid stenosis, 433.10.    FINDINGS:    1.  Spectral analysis of the right carotid bifurcation reveals  normal velocities in the common, internal and external carotid  arteries.  On the left side, normal velocities are present in  the common and external carotid artery with an elevated velocity  of 152 and an end-diastolic velocity of 55 cm per second noted  in the proximal internal carotid.    2.  B-mode and color-flow duplex imaging of the right carotid  bifurcation reveals the presence of moderate heterogeneous  plaque present in the proximal internal carotid.  On the left  side, again moderate plaque is identified in the proximal  internal carotid.    3.  Both vertebral arteries are patent with antegrade flow.    4.  Both subclavian arteries are patent with multiphasic flow.    IMPRESSION:    1.  ESTIMATED DIAMETER REDUCTION OF THE RIGHT INTERNAL CAROTID  ARTERY IS 20-49%.    2.  ESTIMATED DIAMETER REDUCTION OF THE LEFT INTERNAL CAROTID  ARTERY IS AT THE LOWER LIMITS OF 50-69%.    3.  BOTH VERTEBRAL ARTERIES ARE PATENT WITH ANTEGRADE FLOW.    THIS REPRESENTS NO SIGNIFICANT CHANGE COMPARED TO THE PREVIOUS  STUDY DATED 05/18/06.    Shelle Iron  **** end of result ****    Note: An exclamation mark (!) indicates a result that was not dispersed into   the flowsheet.  Document Creation Date: 12/27/2006 7:32 AM  _______________________________________________________________________    (1) Order result status:  Final  Collection or observation date-time: 12/25/2006 16:33:00  Requested date-time: 12/25/2006 09:20:00  Receipt date-time:   Reported date-time: 12/27/2006 07:32:27  Referring Physician: Cristal Deer BERNHEISEL  Ordering Physician: Esau Grew ([id number])  Specimen Source:   Source: QRS  Filler Order Number: [id number]  Lab site: [facility]
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
## 2006-12-25 NOTE — Unmapped (Signed)
Signed by   LinkLogic on 12/27/2006 at 07:05:24  Patient: Tracie Romero  Note: All result statuses are Final unless otherwise noted.    Tests: (1) NM-MYO PERF EF ADDL ([id number])    Order NotePricilla Handler Order Number: [id number]    Non-EMR Ordering Provider: Ann Held, JEFFREY D     Order Note:     *** VERIFIED ***  [facility]  Reason:  C/P  Dict.Staff: Dierdre Highman [phone]    Verified By: Dierdre Highman      Ver: 12/27/06   7:05 am  Exams:  NM-MYOCARDIAL PERF MULT SPECT  NM-MYO PERF WALL MOTION ADD      Dictated 12/25/06 at 16:23    12/25/06  MYOCARDIAL STRESS AND REST IMAGING:    COMPARISON:  None.    INDICATION:  Chest pain.    Bolus of 42.1 mg of adenosine was administered for stress  imaging.  Maximum heart rate was 109 beats per minute.  Maximum  blood pressure is 140/90.  [employer] included 26.4  mCi of technetium Myoview for stress imaging and 4.12 mCi of  thallium-201 at rest.    Stress and rest images are mildly inhomogeneous.  Mild anterior  soft tissue attenuation is evident.  No definite stress-induced  ischemia is identified.  No wall motion abnormality is evident.  Ejection fraction is estimated at 71%.    IMPRESSION:    NO EVIDENCE OF STRESS-INDUCED REVERSIBLE MYOCARDIAL ISCHEMIA.    /pab  **** end of result ****    Note: An exclamation mark (!) indicates a result that was not dispersed into   the flowsheet.  Document Creation Date: 12/27/2006 7:05 AM  _______________________________________________________________________    (1) Order result status: Final  Collection or observation date-time: 12/25/2006 15:00:00  Requested date-time: 12/25/2006 14:15:00  Receipt date-time:   Reported date-time: 12/27/2006 07:05:08  Referring Physician: Sharmon Leyden  Ordering Physician:  Non-EMR Physician [facility])  Specimen Source:   Source: QRS  Filler Order Number: [id number]  Lab site: [facility]

## 2006-12-26 NOTE — Unmapped (Signed)
Signed by   LinkLogic on 12/26/2006 at 13:15:23  Patient: Tracie Romero  Note: All result statuses are Final unless otherwise noted.    Tests: (1) MRI-HEAD W/WO CONTRAST [phone])    Order NotePricilla Handler Order Number: [id number]    Order Note:     *** VERIFIED [facility]  Reason:  CVA-Possible Bleed  Dict.Staff: Tammi Sou [phone]    Verified By: Tammi Sou       Ver: 12/26/06   1:14 pm  Exams:  MRI-HEAD W/WO CONTRAST  MRI-ANGIO HEAD W/O CONTRAST      Dictated 12/26/06 at 08:56    12/26/06  MRI HEAD WITH AND WITHOUT IV CONTRAST:    CLINICAL HISTORY:  Possible CVA, slurred speech.    Type of contrast used was Magnevist, 20 mL.    The sella turcica region and craniocervical junction regions  appear normal.  Bilateral middle ears and left mastoid clear.  Mild right mastoiditis present.  Visualized portions of  paranasal sinuses appear clear.  In the brain, I see no midline  shift, mass effect, or extraaxial fluid collection.  Ventricular  size normal.  I see no intracranial bleed.  No atrophy.  Good  flow voids are noted in the major vessels at the base the brain.    No abnormal brain signal seen.  After IV contrast  administration, we see no abnormal enhancement.    A diffusion-weighted sequence is performed.  This demonstrates  no restrictive diffusion,  thereby ruling out an ischemic  infarct to the brain less than 56-56 days of age.    IMPRESSION:    1.  MILD RIGHT MASTOIDITIS.    2.  NO MIDLINE SHIFT, BLEED, ACUTE MACROINFARCT, OR MASS EFFECT.    3.  NO ABNORMAL BRAIN SIGNAL OR ABNORMAL CONTRAST ENHANCEMENT.    MRA OF THE BASE OF THE BRAIN WITHOUT IV CONTRAST:    CLINICAL HISTORY:  As above.    Utilized 3-D time-of-flight imaging.  The vertebral arteries are  coequal in size and patent and normal in appearance.  No  evidence for dissection.  Both vertebral arteries contribute to  a normal-appearing basilar artery.  I see no major vessel  stenosis or occlusion.  The anterior circle of Willis is  patent.    No fibromuscular disease is seen.  I see no aneurysm 2 mm or  greater in diameter.  Findings compatible with a normal-variant  junctional dilatation noted in region of origin of left  posterior communicating artery.  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  Therefore, the circle of Willis is intact except for the right  posterior communicating artery.    IMPRESSION:    1.  FINDINGS COMPATIBLE WITH A NORMAL-VARIANT JUNCTIONAL  DILATATION AT THE ORIGIN OF THE LEFT POSTERIOR COMMUNICATING  ARTERY.    2.  NO MAJOR VESSEL OCCLUSION, STENOSIS, OR DISSECTION NOTED.    3.  ANTERIOR CIRCLE OF WILLIS INTACT AND NO ANEURYSM SEEN 2 MM  OR GREATER IN DIAMETER.    /ldm  **** end of result ****    Note: An exclamation mark (!) indicates a result that was not dispersed into   the flowsheet.  Document Creation Date: 12/26/2006 1:15 PM  _______________________________________________________________________    (1) Order result status: Final  Collection or observation date-time: 12/26/2006 08:37:00  Requested date-time: 12/26/2006 07:00:00  Receipt date-time:   Reported date-time: 12/26/2006 13:14:39  Referring Physician: Cristal Deer BERNHEISEL  Ordering Physician: Victorino Dike Maegan Buller (MACHUCJL)  Specimen Source:   Source: QRS  Filler Order Number: [id number]  Lab site: [facility]

## 2006-12-26 NOTE — Unmapped (Signed)
Signed by   LinkLogic on 12/26/2006 at 13:15:24  Patient: Tracie Romero  Note: All result statuses are Final unless otherwise noted.    Tests: (1) MRI-ANGIO HEAD W/O CONTRAST ([id number])    Order NotePricilla Handler Order Number: [id number]    Order Note:     *** VERIFIED [facility]  Reason:  CVA-Possible Bleed  Dict.Staff: Tammi Sou [phone]    Verified By: Tammi Sou       Ver: 12/26/06   1:14 pm  Exams:  MRI-HEAD W/WO CONTRAST  MRI-ANGIO HEAD W/O CONTRAST      Dictated 12/26/06 at 08:56    12/26/06  MRI HEAD WITH AND WITHOUT IV CONTRAST:    CLINICAL HISTORY:  Possible CVA, slurred speech.    Type of contrast used was Magnevist, 20 mL.    The sella turcica region and craniocervical junction regions  appear normal.  Bilateral middle ears and left mastoid clear.  Mild right mastoiditis present.  Visualized portions of  paranasal sinuses appear clear.  In the brain, I see no midline  shift, mass effect, or extraaxial fluid collection.  Ventricular  size normal.  I see no intracranial bleed.  No atrophy.  Good  flow voids are noted in the major vessels at the base the brain.    No abnormal brain signal seen.  After IV contrast  administration, we see no abnormal enhancement.    A diffusion-weighted sequence is performed.  This demonstrates  no restrictive diffusion,  thereby ruling out an ischemic  infarct to the brain less than 49-20 days of age.    IMPRESSION:    1.  MILD RIGHT MASTOIDITIS.    2.  NO MIDLINE SHIFT, BLEED, ACUTE MACROINFARCT, OR MASS EFFECT.    3.  NO ABNORMAL BRAIN SIGNAL OR ABNORMAL CONTRAST ENHANCEMENT.    MRA OF THE BASE OF THE BRAIN WITHOUT IV CONTRAST:    CLINICAL HISTORY:  As above.    Utilized 3-D time-of-flight imaging.  The vertebral arteries are  coequal in size and patent and normal in appearance.  No  evidence for dissection.  Both vertebral arteries contribute to  a normal-appearing basilar artery.  I see no major vessel  stenosis or occlusion.  The anterior circle of Willis  is patent.    No fibromuscular disease is seen.  I see no aneurysm 2 mm or  greater in diameter.  Findings compatible with a normal-variant  junctional dilatation noted in region of origin of left  posterior communicating artery.  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  Therefore, the circle of Willis is intact except for the right  posterior communicating artery.    IMPRESSION:    1.  FINDINGS COMPATIBLE WITH A NORMAL-VARIANT JUNCTIONAL  DILATATION AT THE ORIGIN OF THE LEFT POSTERIOR COMMUNICATING  ARTERY.    2.  NO MAJOR VESSEL OCCLUSION, STENOSIS, OR DISSECTION NOTED.    3.  ANTERIOR CIRCLE OF WILLIS INTACT AND NO ANEURYSM SEEN 2 MM  OR GREATER IN DIAMETER.    /ldm  **** end of result ****    Note: An exclamation mark (!) indicates a result that was not dispersed into   the flowsheet.  Document Creation Date: 12/26/2006 1:15 PM  _______________________________________________________________________    (1) Order result status: Final  Collection or observation date-time: 12/26/2006 08:37:00  Requested date-time: 12/26/2006 07:00:00  Receipt date-time:   Reported date-time: 12/26/2006 13:14:39  Referring Physician: Cristal Deer BERNHEISEL  Ordering Physician: Esau Grew (  MACHUCJL)  Specimen Source:   Source: QRS  Filler Order Number: [id number]  Lab site: [facility]

## 2007-01-22 NOTE — Unmapped (Signed)
Signed by   LinkLogic on 01/23/2007 at 08:58:37  Patient: Tanyla Mcpartland  Note: All result statuses are Final unless otherwise noted.    Tests: (1)  (MR)    Order Note:                                     [facility]     PATIENT NAME:   ARRIONA, PREST               MR #:  [id number]  DATE OF BIRTH:  17-Mar-1961                        ACCOUNT #:  [id number]  ADMITTING:      Arlyss Gandy, M.D.           ROOM #:  [phone]  ATTENDING:      Arlyss Gandy, M.D.           NURSING UNIT:  C4S  CONSULTANT:     Eula Flax, M.D.              [facility]:  C  SERVICE:        Cardiology                        ADMIT DATE:  12/24/2006  PRIMARY:        Helane Rima, D.O.           CONSULT DATE:  REFERRING:      Narda Amber, M.D.   DISCHARGE DATE:  12/26/2006  DICTATED BY:    Eula Flax, M.D.                                      CONSULTATION     **** PLEASE DISREGARD, DO NOT USE FOR PATIENT CARE, CORRECTIONS MADE TO  ORIGINAL DOCUMENT - 01/23/07 - J[facility] ***        ***REVISION TO REPORT TO FOLLOW--01/22/07--nlb***      CHIEF COMPLAINT:  The first one.     HISTORY OF PRESENT ILLNESS:  Pg curious pulseless I was asked by to evaluate  and Brett Canales for complaints of chest pain.  Remainder with this she  will remain     a 46 year old white female bubble block 70 just had a first line knee.  The  remainder of the dictation.  Dr. Eula Listen tachycardic.                                                          _______________________________________  GC/nb                                 _____  D:  01/22/2007 17:29                   Eula Flax, M.D.  T:  01/22/2007 21:26  R:  01/23/2007 08:58 - ths  Job #:  [id number]  CONSULTATION                                       COPY                    PAGE    1 of   1    Note: An exclamation mark (!) indicates a result that was not dispersed into   the flowsheet.  Document Creation Date: 01/23/2007 8:58  AM  _______________________________________________________________________    (1) Order result status: Corrected  Collection or observation date-time: 01/22/2007 00:00  Requested date-time:   Receipt date-time:   Reported date-time:   Referring Physician: Sharmon Leyden  Ordering Physician:  Reviewed [facility])  Specimen Source:   Source: DBS  Filler Order Number: [id number]  Lab site:

## 2007-02-26 NOTE — Unmapped (Signed)
Signed by   LinkLogic on 02/26/2007 at 16:05:31    Appointment status changed to no show by  LinkLogic on 02/26/2007 4:05 PM.    No Show Comments  ----------------  (MOF) FOLLOW UP    Appointment Information  -----------------------  Appt Type:         Date:  Tuesday, Feb 26, 2007       Time:  10:00 AM for 30 min    Urgency:  Routine    Made By:  Leonor Liv   To Visit:  [facility]-VASCULAR ULTRASOUND     Reason:  (MOF) FOLLOW UP    Appt Comments  -------------  -- 02/26/07 16:05: (HALLKE) NO SHOW --  (MOF) FOLLOW UP  6 MO FUP --[facility]  PT HAS APT 03-04-07    -- 09/07/06 12:12: (GRACEAE) BOOKED --  Routine  at 02/26/2007 10:00 AM for 30 min  (MOF) FOLLOW UP  6 MO FUP --[facility]  PT HAS APT 03-04-07

## 2007-02-26 NOTE — Unmapped (Signed)
Signed by   LinkLogic on 02/26/2007 at 16:05:30    Appointment status changed to no show by  LinkLogic on 02/26/2007 4:05 PM.    No Show Comments  ----------------  (MOF) FOLLOW UP    Appointment Information  -----------------------  Appt Type:         Date:  Tuesday, Feb 26, 2007       Time:  9:00 AM for 60 min    Urgency:  Routine    Made By:  Leonor Liv   To Visit:  [facility]-VASCULAR ULTRASOUND     Reason:  (MOF) FOLLOW UP    Appt Comments  -------------  -- 02/26/07 16:05: (HALLKE) NO SHOW --  (MOF) FOLLOW UP  6 MO FUP-EXERCISE---[facility]  PT HAS APT 03-04-07    -- 09/07/06 12:12: (GRACEAE) BOOKED --  Routine  at 02/26/2007 9:00 AM for 60 min  (MOF) FOLLOW UP  6 MO FUP-EXERCISE---[facility]  PT HAS APT 6-2-

## 2007-05-30 NOTE — Unmapped (Signed)
Signed by Earleen Newport on 05/30/2007 at 11:18:14    Phone Note   Other Incoming Call    Call From: [facility]  Caller Phone #: [phone]  Call For: [facility]  Reason for Call: talk with provider, talk with nurse  Summary of call: [facility] CALLING. THEY ARE NEEDING TO SPEAK WITH SOMEONE TO GIVE LAB REPORTS TO AS SOON AS YOU CAN PLEASE GIVE THEM A CALL. THANKS!    [facility]- [phone]    This department is not currently using the EMR system. Please see the paper chart for this patient for the response to this phone message.  Initial call taken by: Earleen Newport,  May 30, 2007 11:17 AM

## 2007-06-06 NOTE — Unmapped (Signed)
Signed by Denny Peon MD, RPVI, FACS on 06/06/2007 at 00:00:00  Vascular Physiological Waveforms      Imported By: Coletta Memos 06/07/2007 09:51:58    _____________________________________________________________________    External Attachment:    Please see [facility] EMR for this document.

## 2007-06-06 NOTE — Unmapped (Signed)
Signed by Denny Peon MD, RPVI, FACS on 06/06/2007 at 00:00:00  Vascular Ultrasound Images      Imported By: Coletta Memos 06/07/2007 09:50:01    _____________________________________________________________________    External Attachment:    Please see [facility] EMR for this document.

## 2007-06-06 NOTE — Unmapped (Signed)
Signed by Sammuel Bailiff on 06/06/2007 at 11:30:43      Spectral Doppler Flow Analysis:     RICA:   Proximal PSV: 105  Proximal EDV: 42  Mid PSV: 98  Mid EDV: 34  Distal PSV: 63  Distal EDV: 28    RCCA:   Proximal PSV: 85  Proximal EDV: 30  Mid-Distal PSV: 64  Mid-Distal EDV: 27    RECA:   PSV: 103  EDV: 21    RVert:   PSV: 67  EDV: 21  Antegrade    RSubclavian:   PSV: 151  EDV: 0    Ratio:   RICA: 105/ RCCA: 64  RICA/RCCA Ratio: 1.64    LICA:   Proximal PSV: 170  Proximal EDV: 75  Mid PSV: 163  Mid EDV: 53  Distal PSV: 87  Distal EDV: 27    LCCA:   Proximal PSV: 124  Proximal EDV: 33  Mid-Distal PSV: 101  Mid-Distal EDV: 39    LECA:   PSV: 140  EDV: 35    LVert:   PSV: 52  EDV: 30    LSubclavian:   PSV: 89  EDV: 0    Ratio:   LICA: 170/ LCCA: 101  LICA/LCCA Ratio: 1.68

## 2007-06-06 NOTE — Unmapped (Signed)
Signed by Denny Peon MD, RPVI, FACS on 06/06/2007 at 14:40:10    [facility]  VASCULAR LAB    CAROTID ARTERY DUPLEX      Carotid Artery Duplex     Ordering Physician: Colbert Ewing, MD  Technologist: Sabino Niemann, RVT    Indications: 37 yof presents with Hx of CAS.  Pt has Hx of Lt Subclavian A stent. Previous exam on 12/25/06 revealed R ICA 20-49% and L ICA 50-69% low end.    History: Carotid Artery Stenosis/Occlusion-w/ stroke.  Right Brachial BP: 141/89 mmHg  Left Brachial BP: 130/90 mmHg    Technical Overview:  Real time gray-scale imaging along with spectral and color Doppler were used to evaluate the bilateral extracranial carotid arteries including the common carotid artery starting at the base of the neck, extending to the bifurcation, the proximal external carotid artery and the internal carotid artery, beginning at the origin and extending as far as it can be visualized.  Imaging was performed in longitudinal and transverse planes.  Spectral Doppler information was obtained in a longitdudinal orientation with a 60 degree or less angle throughout the extracranial carotids allowing analysis of the Doppler velocities to quantify severity of disease.  The proximal subclavian and mid vertebral arteries are routinely included in this evaluation.      SPECTRAL DOPPLER FLOW ANALYSIS  RIGHT PSV EDV LEFT PSV EDV   ICA: P 105 cm/s 42 cm/s ICA: P 170 cm/s 75 cm/s   ICA: M 98 cm/s 34 cm/s ICA: M 163 cm/s 53 cm/s   ICA: D 63 cm/s 28 cm/s ICA: D 87 cm/s 27 cm/s   CCA: P 85 cm/s 30 cm/s CCA: P 124 cm/s 33 cm/s   CCA: M-D 64 cm/s 27 cm/s CCA: M-D 101 cm/s 39 cm/s   ECA: 103 cm/s 21 cm/s ECA: 140 cm/s 35 cm/s   VERT P:  VERT M: 67 cm/s  114 cm/s 21 cm/s  37 cm/s VERT: 52 cm/s 30 cm/s   VERT Antegrade VERT: Bidirectional   SUBCL: 151  cm/s 0 cm/s SUBCL: 89 cm/s 0 cm/s   SUBCL:  SUBCL:    ICA/CCA RATIO: 1.64  ICA/CCA RATIO: 1.68            Right:   Plaque identified in the right bifurcation  appears heterogenous  Plaque identified in the right internal carotid appears heterogenous  Comments: The vertebral artery proximally is 67 cm/s, mid the velocity elevates to 115 cm/s.    Left:   Plaque identified in the left bifurcation appears heterogenous  Plaque identified in the left internal carotid appears heterogenous    Impression:   Right ICA disease category is 20-49%   Left ICA disease category is 50-69%   Right vertebral flow direction is antegrade.  Left vertebral flow direction is bidirectional.      Thank you for referring your patient for diagnostic imaging.  If formal vascular surgery consultation is needed, please call [phone].    r  Test Interpreted by:     Leda Roys, MD, RPVI, FACS     Associate Professor of Surgery              Division of Vascular Surgery

## 2007-06-06 NOTE — Unmapped (Signed)
Signed by Denny Peon MD, RPVI, FACS on 06/06/2007 at 14:40:40    [facility]  VASCULAR LAB    Lower Extremity Arterial Study       Ordering Physician: Colbert Ewing, MD  Technologist: Sabino Niemann, RVT    Indications: 65 yof presents with bilateral calf claudication after walking 1/2 blocks.  Pt rests to relieve pain.    History: Intermittent Claudication.    Technical Overview:  Non-invasive lower extremity arterial examination was performed with the patient in a supine position.  Appropriately sized pneumatic cuffs for the segment were applied.   Multilevel segmental pressures were obtained using continuous wave Doppler and sphygmomanometer to monitor return of arterial flow after cuff occlusion.     Findings - Pressures and Waveforms:     Brachial:                Rt:  147 mmHg  Triphasic Lt:  144 mmHg Triphasic    Upper Thigh:          Triphasic Triphasic    Lower Thigh:          Biphasic Triphasic    Calf:                        Biphasic Triphasic    Posterior Tibial:     Rt:  144 mmHg  Biphasic Lt:  167 mmHg Triphasic    Dorsalis Pedis:      Rt:  142 mmHg  Triphasic Lt:  154 mmHg Triphasic    Toes:            Rt:  105 mmHg  TBI 0.71 Lt:  116 mmHg TBI 0.79    Right Ankle Brachial Index: 0.98  Left Ankle Brachial Index: 1.14    Exercise Protocol:   1.5 mph at 10% grade for five minutes.  Pt began feeling pain in bilateral calves after 2 minutes 39 seconds.    Post Exercise - Pressures and Waveforms:     Brachial:                Rt:  152 mmHg      Upper Thigh:         Triphasic Triphasic    Posterior Tibial:     Rt:  121 mmHg  Lt:  162 mmHg     Dorsalis Pedis:      Rt:  137 mmHg      Right Ankle Brachial Index: 0.90  Left Ankle Brachial Index: 1.07    ABI Comment:   Both left and right ABI normal at rest.  There was a reduction in bilateral ABI's after exercise.  There was an 8% drop in the RT and 7% in the LT.  After exercise the RT ABI falls to the mild claudication  range.      Thank you for referring your patient for diagnostic imaging.  If formal vascular surgery consultation is needed, please call [phone].      Test Interpreted by:   Leda Roys, MD, RPVI, FACS     Associate Professor of Surgery              Division of Vascular Surgery

## 2007-06-13 ENCOUNTER — Inpatient Hospital Stay

## 2007-06-13 NOTE — Unmapped (Signed)
Signed by   LinkLogic on 06/13/2007 at 15:10:47  Patient: Tracie Romero  Note: All result statuses are Final unless otherwise noted.    Tests: (1) CT-ANGIOGRAPHY HEAD W &/OR WO [phone])    Order Note: Pricilla Handler Order Number: [id number]    Order Note:     *** VERIFIED ***  [facility]  Reason:  CTA HEAD AND NECK FOR LEFT ICA STENOSIS AND TIA'S  Dict.Staff: Drake Leach [phone]  Dict.Res: Orma Render [id number]  Verified By: Drake Leach          Ver: 06/13/07   3:10 pm  Exams:  CT-ANGIOGRAPHY HEAD W &/OR WO  CT-ANGIOGRAPHY NECK W &/OR WO    CT angiography of the head and neck dated 06/13/2007 at 10:46  a.m..    Indication: CTA Head and Neck, left ICA stenosis , TIAs.    Comparison: Correlation is made with CTA neck performed on  02/20/2007 at [facility]. Direct images were not available  for comparison.    Technique: A total of 100 cc of Isovue 370 was given  intravenously.  Helically acquired axial images were obtained  from the aortic arch through the frontal sinuses at 2.5 mm slice  thickness. Sagital and coronal reconstructed images were  generated. Subsequent 3 dimensional images were rendered using a  separate 3-D workstation.    CERVICAL CTA FINDINGS:    There is atherosclerotic thickening of the walls of the aortic  arch. There is a proximal left subclavian arterial stent.    On image 23, series 9 of the coronal images there is mild  narrowing and wall thickening at the proximal shared common  origin of the right innominate and left common carotids.    There is a long 1.2 cm segment of approximately 40-50% stenosis  involving the right internal carotid artery beginning at the  origin/right carotid bulb with associated atherosclerotic  calcification.    There is a long 8-9 mm segment of approximately 40-50% stenosis  beginning at the origin of the left internal carotid artery/  left carotid bulb also with associated atherosclerotic  calcification.    These findings are similar to the prior report  dated 02/20/2007.    On image 9., Series 11 there is minimal irregularity of the left  distal vertebral artery at the level of the C1 arch with no  significant stenosis. The remainder of the cervical portions of  the vertebral arteries are normal in course, contour, and  caliber.    CTA NECK IMPRESSION:    1. Long segments of approximately 40-50% stenosis involving the  bilateral proximal internal carotid arteries as detailed above.  Although the prior images are not available for direct  comparison, these findings were reported on 02/20/2007  examination.  2. Minimal irregularity the distal left vertebral artery at the  level of the C1 arch with no significant stenosis.      CTA HEAD FINDINGS:    There is atherosclerotic calcification of the parasellar carotid  arteries.    The distal vertebral arteries and the basilar trunk demonstrate  normal course, contour, and caliber.  The posterior cerebral  arteries demonstrate no flow limitation.      The intracranial right internal carotid artery is normal in  course, contour, and caliber.    The intracranial left internal carotid artery is normal in  course, contour, and caliber.    There is normal flow signal in the A1 segments bilaterally.  The  M1 and M2 segments also have normal  flow signal bilaterally.  There is no focal stenosis, occlusion, or aneurysm demonstrated.    CTA HEAD IMPRESSION:    1. Minimal atherosclerotic calcification the parasellar carotid  arteries.  2. Otherwise normal CTA Head.  **** end of result ****    Note: An exclamation mark (!) indicates a result that was not dispersed into   the flowsheet.  Document Creation Date: 06/13/2007 3:10 PM  _______________________________________________________________________    (1) Order result status: Final  Collection or observation date-time: 06/13/2007 10:30:00  Requested date-time: 06/13/2007 10:30:00  Receipt date-time:   Reported date-time: 06/13/2007 15:10:40  Referring Physician: Cindi Carbon  NON-STAFF  Ordering Physician: Ellanor Feuerstein (REEDA)  Specimen Source:   Source: QRS  Filler Order Number: [id number]  Lab site: [facility]

## 2007-06-13 NOTE — Unmapped (Signed)
Signed by   LinkLogic on 06/13/2007 at 15:10:48  Patient: Tracie Romero  Note: All result statuses are Final unless otherwise noted.    Tests: (1) CT-ANGIOGRAPHY NECK W &/OR WO [phone])    Order Note: Pricilla Handler Order Number: [id number]    Order Note:     *** VERIFIED ***  [facility]  Reason:  CTA HEAD AND NECK FOR LEFT ICA STENOSIS AND TIA'S  Dict.Staff: Drake Leach [phone]  Dict.Res: Orma Render [id number]  Verified By: Drake Leach          Ver: 06/13/07   3:10 pm  Exams:  CT-ANGIOGRAPHY HEAD W &/OR WO  CT-ANGIOGRAPHY NECK W &/OR WO    CT angiography of the head and neck dated 06/13/2007 at 10:46  a.m..    Indication: CTA Head and Neck, left ICA stenosis , TIAs.    Comparison: Correlation is made with CTA neck performed on  02/20/2007 at [facility]. Direct images were not available  for comparison.    Technique: A total of 100 cc of Isovue 370 was given  intravenously.  Helically acquired axial images were obtained  from the aortic arch through the frontal sinuses at 2.5 mm slice  thickness. Sagital and coronal reconstructed images were  generated. Subsequent 3 dimensional images were rendered using a  separate 3-D workstation.    CERVICAL CTA FINDINGS:    There is atherosclerotic thickening of the walls of the aortic  arch. There is a proximal left subclavian arterial stent.    On image 23, series 9 of the coronal images there is mild  narrowing and wall thickening at the proximal shared common  origin of the right innominate and left common carotids.    There is a long 1.2 cm segment of approximately 40-50% stenosis  involving the right internal carotid artery beginning at the  origin/right carotid bulb with associated atherosclerotic  calcification.    There is a long 8-9 mm segment of approximately 40-50% stenosis  beginning at the origin of the left internal carotid artery/  left carotid bulb also with associated atherosclerotic  calcification.    These findings are similar to the prior report  dated 02/20/2007.    On image 9., Series 11 there is minimal irregularity of the left  distal vertebral artery at the level of the C1 arch with no  significant stenosis. The remainder of the cervical portions of  the vertebral arteries are normal in course, contour, and  caliber.    CTA NECK IMPRESSION:    1. Long segments of approximately 40-50% stenosis involving the  bilateral proximal internal carotid arteries as detailed above.  Although the prior images are not available for direct  comparison, these findings were reported on 02/20/2007  examination.  2. Minimal irregularity the distal left vertebral artery at the  level of the C1 arch with no significant stenosis.      CTA HEAD FINDINGS:    There is atherosclerotic calcification of the parasellar carotid  arteries.    The distal vertebral arteries and the basilar trunk demonstrate  normal course, contour, and caliber.  The posterior cerebral  arteries demonstrate no flow limitation.      The intracranial right internal carotid artery is normal in  course, contour, and caliber.    The intracranial left internal carotid artery is normal in  course, contour, and caliber.    There is normal flow signal in the A1 segments bilaterally.  The  M1 and M2 segments also have normal  flow signal bilaterally.  There is no focal stenosis, occlusion, or aneurysm demonstrated.    CTA HEAD IMPRESSION:    1. Minimal atherosclerotic calcification the parasellar carotid  arteries.  2. Otherwise normal CTA Head.  **** end of result ****    Note: An exclamation mark (!) indicates a result that was not dispersed into   the flowsheet.  Document Creation Date: 06/13/2007 3:10 PM  _______________________________________________________________________    (1) Order result status: Final  Collection or observation date-time: 06/13/2007 10:30:00  Requested date-time: 06/13/2007 10:30:00  Receipt date-time:   Reported date-time: 06/13/2007 15:10:40  Referring Physician: Cindi Carbon  NON-STAFF  Ordering Physician: Yazleen Molock (REEDA)  Specimen Source:   Source: QRS  Filler Order Number: [id number]  Lab site: [facility]
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
## 2007-06-13 NOTE — Unmapped (Addendum)
Signed by Leonor Liv on 06/13/2007 at 10:26:08    Clinical Lists Changes    Orders:  Added new Test order of CT, Angiography Head, w/ & w/o contrast, w/3D Recon ([id number]) - Signed  Added new Test order of CT, Angiography Neck, w/ & w/o contrast, w/3D Recon ([id number]) - Signed    Signed by Leonor Liv on 06/13/2007 at 10:26:38    PT HAVING DONE TODAY @ [facility]  PT SET UP TO COME BACK 07-04-07  ORDER FAXED TO [facility]  ORDER ROUTED TO PRE-CERT

## 2007-06-13 NOTE — Unmapped (Signed)
Signed by Colbert Ewing MD on 06/13/2007 at 00:00:00  CT Angiography Head/Neck      Imported By: Maryellen Pile 06/17/2007 12:59:02    _____________________________________________________________________    External Attachment:    Please see [facility] EMR for this document.
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
## 2007-06-26 NOTE — Unmapped (Signed)
Signed by Rudolpho Sevin on 06/26/2007 at 12:58:43    Phone Note   Patient Call  Department: Family Medicine  Call for: DR. Phoebe Sharps    Reason for Call: speak with provider  Summary of Call: PT. WOULD LIKE TO SPEAK W/DR. Marquiz Sotelo ASAP, SHE WANTS TO BE DISCHARGED TODAY FROM [facility], PT. CAN BE REACHED AT [phone], ROOM 568.     Initial call taken by: Rudolpho Sevin,  June 26, 2007 12:58 PM    This department is not currently using the EMR system. Please see the paper chart for this patient for the response to this phone message.

## 2007-06-27 NOTE — Unmapped (Signed)
Signed by   LinkLogic on 06/27/2007 at 09:53:24    Appointment status changed to no show by  LinkLogic on 06/27/2007 9:53 AM.    No Show Comments  ----------------  Tracie Romero) CHECK UP    Appointment Information  -----------------------  Appt Type:         Date:  Thursday, June 27, 2007       Time:  8:45 AM for 15 min    Urgency:  Routine    Made By:  Marshell Garfinkel   To Visit:  [name]     Reason:  Tracie Romero) CHECK UP    Appt Comments  -------------  -- 06/27/07 9:53: (GORDONTR) NO SHOW --  (GWO) CHECK UP  CHECK UP ON STROKE    -- 06/25/07 13:24: (LAIBKM) BOOKED --  Routine  at 06/27/2007 8:45 AM for 15 min  (GWO) CHECK UP  CHECK UP ON STROKE

## 2007-06-28 NOTE — Unmapped (Signed)
Signed by   LinkLogic on 07/15/2007 at 13:59:32  Patient: Tracie Romero  Note: All result statuses are Final unless otherwise noted.    Tests: (1)  (MR)    Order Note:                                   [facility]     PATIENT NAME:   Tracie Romero, Tracie Romero               MR #:  [id number]  DATE OF BIRTH:  08-Nov-1960                        ACCOUNT #:  [id number]  PHYSICIAN:      Collier Flowers, M.D.                  ROOM #:  [id number]  SERVICE:        Internal Medicine/Cardiology      NURSING UNIT:  [facility]  PRIMARY:        Helane Rima, D.O.           FC:  C  REFERRING:      Selected Referral Pt              ADMIT DATE:  06/28/2007  DICTATED BY:    Harvest Forest, M.D.            PROCEDURE DATE:                                                    DISCHARGE DATE:  06/30/2007                                     PROCEDURE/OTHER     ***DEMOGRAPHIC REVISION/ATTENDING - 07/15/07 -[facility]***     PROCEDURE:     1. Left heart catheterization.     ATTENDING PHYSICIAN:     1. Dr. Collier Flowers.  2. Dr. Shawn Route.     PROCEDURE PERFORMED     1. Left heart catheterization.  2. Selective cholangiography.  3. Left ventriculogram.  4. Selective right femoral arteriography.  5. Ascending aorta arteriography.     INDICATION OF PROCEDURE:  A 46 year old female with chest pain and ST  elevation MI in lateral lead.     DETAILS OF PROCEDURE:  The patient was brought to the [facility] via  catheterization laboratory in an urgent situation and prepped and draped in  usual sterile fashion for intended access to the right femoral artery.  The  risks, benefits and complications were explained to the patient and all  questions were answered and informed consent was obtained.  Lidocaine 2% was  injected subcutaneously to achieve local anesthesia.  The right femoral  artery was accessed via modified Seldinger technique without difficulty.  The  6 French arterial sheath was advanced over the wire into the artery and  aspirated and  flushed without difficulty.  A JL4 catheter was advanced over  the wire and visualized under fluoroscopy and used to selectively engage left  coronary ostia.  Multiple images of left coronary tree were obtained.     Next, The JL4 catheter was  removed and exchanged over the wire with a JR4  catheter.  The JR4 catheter was used selectively to engage the right coronary  ostia.  Images of the right coronary tree were obtained.  Then the JR4  catheter was removed, and it was exchanged with a wire with a pigtail  catheter.  The pigtail catheter was advanced over the guide wire, visualized  under fluoroscopy, and used to cross the aortic valve and entered the left  ventricular cavity.  Left ventricular end diastolic pressure was measured.  A  left ventriculogram was performed using a power injector.  The pigtail  catheter was pulled back across the aortic valve demonstrating no gradient.  The pigtail catheter was placed in ascending aorta and ascending aorta  arteriography was performed using power injector.  The right femoral  arteriography was done using arterial sheath.  At the end of the procedure,  the patient remained stable and no complication was noted.  The patient was  sent to the CCU with sheath removal protocol.     PROCEDURE FINDINGS:     1. Left main coronary artery is a large caliber artery and is  angiographically normal.  Left main bifurcates to left circumflex artery and  left anterior descending artery.  2. Left anterior descending artery is a large caliber vessel.  LAD is  diffusely diseased.  There is atherosclerotic lesion in mid LAD with  approximately 60% stenosis.  Of note, aFFR has been performed on this mid LAD  lesion at [facility] in April, 2008, which showed FFR of 0.8 which was  not clinically significant.  LAD gives [place] two diagonal branches.  There is a  stent in first diagonal branch, which is widely patent and has TIMI-3 flow.  LAD is diffusely diseased.  3. Left circumflex artery is  large caliber vessel, gives of two obtuse  marginal branches.  First OM has stent, which is likely patent and had TIMI-3  flow.  At the bifurcation of the OM, there is a 40 to 50% stenotic lesion.  There is a post stent stenosis 40 percent.  Left circumflex is diffusely  diseased.  4. Right coronary artery is a dominant large vessel artery, which gives of  PLV and PDA branch, it is patent in its mid segment with TIMI-3 flow.  5. Left ventriculogram findings, ejection fraction estimated at 65%.  No  regional wall motion abnormality was noted.  6. Ventricular end diastolic pressure was 15.  7. Gradient across the aortic valve was none.  8. Ascending aorta arteriography showed no signs of aortic dissection.  9. Right common femoral arteriography showed severe diffuse disease in iliac  and common femoral artery.  There is diffuse disease in the right iliac and  common femoral artery.  Common femoral artery has severe stenosis.     FINAL DIAGNOSIS:     1. Diffuse non-occlusive coronary artery disease.  Patent stent and obtuse  marginal branch and diagonal branch and mid RCA with TIMI-3 flow.     PLAN:     1. We will transfer the patient to CCU and will treat her with optimum  medical therapy and aggressive risk factor modification.     Dr. Richardine Service was present during the procedure and supervised the whole procedure.  _______________________________________  RM/am                                  _____  D:  06/28/2007 22:43                  Collier Flowers, M.D.  T:  06/29/2007 02:22                  Dictated by:  Harvest Forest, M.D.  R:  07/15/2007 13:59 - ths  Job #:  [id number]                                           PROCEDURE/OTHER                                       COPY                    PAGE    1 of   1    Note: An exclamation mark (!) indicates a result that was not dispersed into   the flowsheet.  Document Creation Date: 07/15/2007 1:59  PM  _______________________________________________________________________    (1) Order result status: Final  Collection or observation date-time: 06/28/2007 00:00  Requested date-time:   Receipt date-time:   Reported date-time:   Referring Physician: Selected Pt  Ordering Physician:  Reviewed In [facility])  Specimen Source:   Source: DBS  Filler Order Number: [id number]  Lab site:

## 2007-06-28 NOTE — Unmapped (Signed)
Signed by   LinkLogic on 06/29/2007 at 07:10:37  Patient: Tracie Romero  Note: All result statuses are Final unless otherwise noted.    Tests: (1) CT-HEAD W/O CONTRAST ([id number])    Order NotePricilla Handler Order Number: [id number]    Non-EMR Ordering Provider: Carlota Raspberry      Order Note:     *** VERIFIED ***         ADDENDED REPORT     ADD[facility]UM# 1  [facility]  Reason:  RT SIDED  WEAKNESS  Dict.Staff: Biagio Borg [phone]  Dict.Res: Lana Fish [id number]  Verified By: Biagio Borg   Ver: 06/29/07   7:10 am  Exams:  CT-HEAD W/O CONTRAST      I have reviewed the resident preliminary findings and agree. No  discrepancies are noted.    Vascular calcifications are noted in the vertebral arteries.  *** VERIFIED ***         ADDENDED REPORT     ADDENDUM# 0  [facility]  Reason:  RT SIDED  WEAKNESS  Dict.Staff: PRELIMINARY, TUH TUHPLIM  Dict.Res: Lana Fish [id number]  Verified By: PRELIMINARY, Margaretmary Dys       Ver: 06/29/07   6:05 am  Exams:  CT-HEAD W/O CONTRAST      Noncontrast CT scan of the head dated  Sept 26, 2008 8:41:11 PM    Indication:  RT SIDED  WEAKNESS    Comparison:  None available.    Technique:  Helically acquired 5mm axial images were obtained  from the skull base to the vertex. No contrast was administered.    Findings:  The ventricles, sulci, and cisterns are normal in size and  configuration.  The brain parenchyma is normal in attenuation.  There is no evidence of intracranial hemorrhage or mass effect.  No extra-axial fluid is seen.  The paranasal sinuses are clear.  The globes and orbits are normal.  No abnormalities of the  calvarium are identified. There is atherosclerotic calcification  of basilar and parasellar carotid arteries.    Impression:  1. No evidence of intracranial hemorrhage or mass lesion .    The Initial report of this examination was provided by a  ***Resident Radiologist***    The report becomes the final report only after image review and  report signature  by the Staff Radiologist.  **** end of result ****    Note: An exclamation mark (!) indicates a result that was not dispersed into   the flowsheet.  Document Creation Date: 06/29/2007 7:10 AM  _______________________________________________________________________    (1) Order result status: Final  Collection or observation date-time: 06/28/2007 20:41:11  Requested date-time: 06/28/2007 20:27:00  Receipt date-time:   Reported date-time: 06/29/2007 07:10:32  Referring Physician: Nelida Gores REFERRAL PT  Ordering Physician:  Non-EMR Physician [facility])  Specimen Source:   Source: QRS  Filler Order Number: [id number]  Lab site: [facility]

## 2007-06-28 NOTE — Unmapped (Signed)
Signed by   LinkLogic on 06/28/2007 at 23:40:52  Patient: Tracie Romero  Note: All result statuses are Final unless otherwise noted.    Tests: (1) DIAG-CHEST PA OR AP [phone])    Order NotePricilla Handler Order Number: [id number]    Non-EMR Ordering Provider: Carlota Raspberry      Order Note:     *** VERIFIED [facility]  Reason:  ACUTE MI  Dict.Staff: Lebron Conners [phone]  Dict.Res: Alba Cory [id number]  Verified By: Norberta Keens A         Ver: 06/28/07  11:40 pm  Exams:  DIAG-CHEST PA OR AP      Exam: Portable view of the chest dated June 28, 2007.    Clinical Indication: 46 year old female with acute MI.    Comparison: Portable views of the chest dated October 26, 2006,  and October 16 and 17, 2007.    Findings: The lungs are low volume. There are no areas of focal  consolidation. The pulmonary vasculature is normal. The  cardiomediastinal silhouette is of normal size and contour. The  costophrenic angles are sharp. There are no osseous or soft  tissue abnormalities identified.    Impression:    No evidence of acute cardiopulmonary disease.  **** end of result ****    Note: An exclamation mark (!) indicates a result that was not dispersed into   the flowsheet.  Document Creation Date: 06/28/2007 11:40 PM  _______________________________________________________________________    (1) Order result status: Final  Collection or observation date-time: 06/28/2007 20:44:05  Requested date-time: 06/28/2007 20:05:00  Receipt date-time:   Reported date-time: 06/28/2007 23:40:48  Referring Physician: Nelida Gores REFERRAL PT  Ordering Physician:  Non-EMR Physician [facility])  Specimen Source:   Source: QRS  Filler Order Number: [id number]  Lab site: [facility]
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
## 2007-07-02 NOTE — Unmapped (Signed)
Signed by Arva Chafe on 07/02/2007 at 11:29:28    Phone Note   Patient Call  [facility] Cell Phone #: [phone]  Caller: patient  Department: Family Medicine  Call for: [facility]    Summary of Call: PT SAID SHE DROPPED OFF FORMS YESTERDAY 9/29 FOR THE DR. TO FILL OUT WAS TRYING TO SE IF THEY WERE READY. PLS CALL HER AT [phone].THANKS     Initial call taken by: Arva Chafe,  July 02, 2007 11:28 AM    This department is not currently using the EMR system. Please see the paper chart for this patient for the response to this phone message.
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
## 2007-07-02 NOTE — Unmapped (Signed)
Signed by   LinkLog[employer] on 07/02/2007 at 20:50:36  Patient: Tracie Romero  Note: All result statuses are Final unless otherwise noted.    Tests: (1) DIAG-CHEST PA OR AP [id number])    Order NotePricilla Handler Order Number: [id number]    Non-EMR Ordering Provider: Jannett Celestine     Order Note:     *** VERIFIED [facility]  Reason:  CP  Dict.Staff: Gregary Signs [phone]    Verified By: Rick Duff T       Ver: 07/02/07   8:50 pm  Exams:  DIAG-CHEST PA OR AP      Portable chest  Jul 02, 2007 8:45:55 PM    Indication: chest pain    The heart size is normal, similar to June 28, 2007. The  lungs are clear. A vascular stent overlies the upper left  mediastinum, similar to the prior study.    Impression:  1. Negative portable chest.  **** end of result ****    Note: An exclamation mark (!) indicates a result that was not dispersed into   the flowsheet.  Document Creation Date: 07/02/2007 8:50 PM  _______________________________________________________________________    (1) Order result status: Final  Collection or observation date-time: 07/02/2007 20:45:55  Requested date-time: 07/02/2007 20:13:00  Receipt date-time:   Reported date-time: 07/02/2007 20:50:33  Referring Physician: Nelida Gores REFERRAL PT  Ordering Physician:  Non-EMR Physician [facility])  Specimen Source:   Source: QRS  Filler Order Number: [id number]  Lab site: [facility]
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
## 2007-07-02 NOTE — Unmapped (Signed)
Signed by Roland Earl RN on 07/02/2007 at 11:50:09    Phone Note   Patient Call      Cardiovascular Nurse Coordinator Note:     Chest Pain:   Intensity: 6  Duration: started this am  Comments: Patient states that she was having chest pain and SOB and then fell down steps. She states her son is in bed and he doesn't hear her call out. She called her daughter out of state and told her to call us. She is unable to get up. The symtoms she has are warranted for her to call 911. She agrees. She is calling 911 and I have paged to notified Dr Jorene Minors.  Physician Notified by: pager

## 2007-07-04 NOTE — Unmapped (Signed)
Signed by Colbert Ewing MD on 07/11/2007 at 12:23:06    VASCULAR SURGERY FOLLOW-UP VISIT    Chief Complaint: 2 week follow up    History of Present Illness   Admitted multiple times with both chest pain and symptoms of right-sided numbness. Husband states BP has been low each time she goes in.      Past History  Past Medical History (reviewed - no changes required):  Coronary Artery Disease, Hyperlipidemia, Hypertension, Depression  Surgical History (reviewed - no changes required):  Coronary Stent: x7, 1 stent in left arm, TIA    Family History (reviewed - no changes required): HTN, hyperlipidemia, heart disease, diabetes, cancer  Social History (reviewed - no changes required): Marital Status: married,   Children: 2,   Employment Status: disabled,   Patient Lives at: home,   Support System: excellent  Caffeine per Day: 4+  Seatbelt Use: 100 % of the time  Alcohol Use: none  Drug Use: none  Tobacco Usage:smoker  Cigarettes-Packs per Day- .5,     Review of Systems   General: Complains of anorexia, fatigue, not resting well. Denies chills, fevers, weight loss, weight gain, insomnia.   Cardiac: Complains of chest pains, dizziness, dyspnea on exertion, dyspnea at rest. Denies palpitations, peripheral edema, PND,  presyncope, orthopnea, syncope.   Vascular: Complains of lower extremity heaviness. Denies see HPI, amaurosis fugax, lower extremity claudication, lower extremity swelling, lower extremity rest pain, lower extremity ulceration (venous), lower extremity ulceration (arterial), stroke, transient ischemic attacks (TIA), upper extremity claudication, upper extremity weakness.     Medications   PLAVIX 75 MG TABS (CLOPIDOGREL BISULFATE) 1 qd  ASPIRIN 81 MG TBEC (ASPIRIN) 1 qd  EFFEXOR XR 75 MG CP24 (VENLAFAXINE HCL) 1 qd  PRINIVIL 10 MG TABS (LISINOPRIL) 1 qd  FOLIC ACID 1 MG TABS (FOLIC ACID) 1 qd  ISOSORBIDE MONONITRATE CR 60 MG TB24 (ISOSORBIDE MONONITRATE) 1 qd  AMLODIPINE BESYLATE 5 MG TABS (AMLODIPINE BESYLATE) 1  qd  XANAX 0.5 MG TABS (ALPRAZOLAM) one by mouth at bedtime  LIPITOR 80 MG TABS (ATORVASTATIN CALCIUM) daily  TRICOR 145 MG TABS (FENOFIBRATE) qd  ZETIA 10 MG TABS (EZETIMIBE) daily  NITROGLYCERIN SUBL (NITROGLYCERIN SUBL) 0.4mg  as needed    Allergies  No Known Allergies    Dominant Hand: Right    Vital Signs   Height: 61 in.  Weight: 163 lbs. BMI (in-lb): 30.91  Temperature: 98  degrees  F (oral)  Pulse rate: 78 Pulse rhythm: regular Respirations: 19    Pain Level: 0  Note: Pain level is assessed on a 10 point scale. 0 being pain free, 1 being barely noticeable pain and 10 being the worst pain patient has ever felt.    Blood Pressure   BP #1: 112 / 82mm Hg  Cuff Size: Std #1: left arm     Intake recorded by: Theodoro Grist Shivers LPN  July 04, 2007 10:03 AM      Physical Examination  Constitutional: alert, no acute distress, well hydrated, well developed, well nourished.   Neck: supple, no adenopathy, no masses, left carotid bruit.   Cardiac: regular rate and rhythm, no gallop or rub or click.   Murmur:  none.   Abdomen: nondistended, nontender, normal BS, no hepatosplenomegaly, no hernias.   Wounds (RLE):  none.   Wounds (LLE):  none.   Radial Pulse- Right: normal.   Radial Pulse- Left: normal.   Dorsalis Pedis Pulse- Right: normal.   Dorsalis Pedis Pulse- Left: normal.  Neuro: normal, cranial nerves II-XII intact, sensation intact, motor intact.   Psych: affect and mood appropriate, normal interaction.   9/11 CTA CTA NECK IMPRESSION:    1. Long segments of approximately 40-50% stenosis involving the  bilateral proximal internal carotid arteries as detailed above.  Although the prior images are not available for direct  comparison, these findings were reported on 02/20/2007  examination.  2. Minimal irregularity the distal left vertebral artery at the  level of the C1 arch with no significant stenosis.      CTA HEAD FINDINGS:    There is atherosclerotic calcification of the parasellar carotid  arteries.    The  distal vertebral arteries and the basilar trunk demonstrate  normal course, contour, and caliber.  The posterior cerebral  arteries demonstrate no flow limitation.      The intracranial right internal carotid artery is normal in  course, contour, and caliber.    The intracranial left internal carotid artery is normal in  course, contour, and caliber.    There is normal flow signal in the A1 segments bilaterally.  The  M1 and M2 segments also have normal flow signal bilaterally.  There is no focal stenosis, occlusion, or aneurysm demonstrated.    CTA HEAD IMPRESSION:    1. Minimal atherosclerotic calcification the parasellar carotid  arteries.  2. Otherwise normal CTA Head.        Lower Extremity Arterial Study       Ordering Physician: Colbert Ewing, MD  Technologist: Sabino Niemann, RVT    Indications: 57 yof presents with bilateral calf claudication after walking 1/2 blocks.  Pt rests to relieve pain.    History: Intermittent Claudication.    Technical Overview:  Non-invasive lower extremity arterial examination was performed with the patient in a supine position.  Appropriately sized pneumatic cuffs for the segment were applied.   Multilevel segmental pressures were obtained using continuous wave Doppler and sphygmomanometer to monitor return of arterial flow after cuff occlusion.     Findings - Pressures and Waveforms:     Brachial:                Rt:  147 mmHg  Triphasic Lt:  144 mmHg Triphasic    Upper Thigh:          Triphasic Triphasic    Lower Thigh:          Biphasic Triphasic    Calf:                        Biphasic Triphasic    Posterior Tibial:     Rt:  144 mmHg  Biphasic Lt:  167 mmHg Triphasic    Dorsalis Pedis:      Rt:  142 mmHg  Triphasic Lt:  154 mmHg Triphasic    Toes:            Rt:  105 mmHg  TBI 0.71 Lt:  116 mmHg TBI 0.79    Right Ankle Brachial Index: 0.98  Left Ankle Brachial Index: 1.14    Exercise Protocol:   1.5 mph at 10% grade for five minutes.  Pt began feeling pain in bilateral calves  after 2 minutes 39 seconds.    Post Exercise - Pressures and Waveforms:     Brachial:                Rt:  152 mmHg      Upper Thigh:  Triphasic Triphasic    Posterior Tibial:     Rt:  121 mmHg  Lt:  162 mmHg     Dorsalis Pedis:      Rt:  137 mmHg      Right Ankle Brachial Index: 0.90  Left Ankle Brachial Index: 1.07    Assessment and Plan   46 y/o female with recurrent episodes of left hemispheric TIA-like symptoms that appear to be occurring in the setting of low blood pressure. Her CTA does not demonstrate significant change when compared to her previous CTA and reveals  40-50% stenosis which would be treated medically at this time. She does have headache whenever these episodes occur - perhaps there is a migraine aura as an etiology. I have asked her to seek assistance with her Neurologist. In addition, consideration should be given to keeping her blood pressure higher to see if this might improve her symptoms.     MIld right lower extremity claudication. Her ABI drops to .90 from 0.98 with exercise - remaining in the mild claudication range. The left leg is normal at rest and with exercise. I would not recommend intervention at this time. Suggested to the patient that smoking cessation is in her best interest given her coronary and carotid disease.    Plan  Orders for today's visit  1. Ordered 99214 - Ofc Vst, Est Level IV [CPT-99214]    Additional Plan  Carotid Duplex in 6 months    DISPOSITION:    Return to clinic in 6 month(s)     cc:   Ian Bushman, MD  Jackey Loge  Dr. Marda Stalker            ]

## 2007-07-08 NOTE — Unmapped (Signed)
Signed by Henreitta Cea on 07/08/2007 at 13:34:24    Phone Note   Other Incoming Call    Call From: [facility]  Caller's Name: Evlyn Kanner  Caller Phone #: [phone]  Call For: DR Phoebe Sharps  Reason for Call: need patient information  Summary of call: BARB A CASE MANAGER WITH [facility] IS REQUESTING THE LAST OFFICE NOTES FOR PATIENT. PLEASE FAX THIS TO: [phone]. ATT: BARB EVANS    This department is not currently using the EMR system. Please see the paper chart for this patient for the response to this phone message.  Initial call taken by: Henreitta Cea,  July 08, 2007 1:32 PM

## 2007-07-15 NOTE — Unmapped (Signed)
Signed by Richard Miu MA on 07/15/2007 at 19:33:24    Prescriptions:  ISOSORBIDE MONONITRATE CR 60 MG TB24 (ISOSORBIDE MONONITRATE) 1 qd  #30 x 1   Entered by: Richard Miu MA   Authorized by: Kandra Nicolas MD   Signed by: Richard Miu MA on 07/15/2007   Method used: Telephoned to ...     [facility]     [address]     [place], [place]  [address]     Ph: [phone]     Fax: [phone]   RxID: [id number]      rx faxed ..................................................................Marland KitchenRichard Miu MA  July 15, 2007 7:32 PM

## 2007-07-15 NOTE — Unmapped (Signed)
Signed by Bunnie Pion RMA on 07/16/2007 at 09:56:21    [facility] - PRELOAD      Preload Clinical Lists   Problems added:   STROKE (ICD-434.91)  TIA (ICD-435.9)  CLAUDICATION, INTERMITTENT (ICD-443.9)  HYPERLIPIDEMIA (ICD-272.4)  CAROTID ARTERY DISEASE (ICD-433.10)  HYPERTENSION (ICD-401.9)  DEPRESSION (ICD-311)  CAD (ICD-414.00)  HEADACHE (ICD-784.0)  SPELLS (ICD-780.39)    Medications added:   PLAVIX 75 MG TABS (CLOPIDOGREL BISULFATE) 1 qd  ASPIRIN 81 MG TBEC (ASPIRIN) 1 qd  EFFEXOR XR 75 MG CP24 (VENLAFAXINE HCL) 1 qd  PRINIVIL 10 MG TABS (LISINOPRIL) 1 qd  FOLIC ACID 1 MG TABS (FOLIC ACID) 1 qd  ISOSORBIDE MONONITRATE CR 60 MG TB24 (ISOSORBIDE MONONITRATE) 1 qd  AMLODIPINE BESYLATE 5 MG TABS (AMLODIPINE BESYLATE) 1 qd  XANAX 0.5 MG TABS (ALPRAZOLAM) one by mouth at bedtime  LIPITOR 80 MG TABS (ATORVASTATIN CALCIUM) daily  TRICOR 145 MG TABS (FENOFIBRATE) qd  ZETIA 10 MG TABS (EZETIMIBE) daily  NITROGLYCERIN SUBL (NITROGLYCERIN SUBL) 0.4mg  as needed      Past History  Past Medical History:  Coronary Artery Disease, Hyperlipidemia, Hypertension, Depression  Surgical History:  Coronary Stent: x7, 1 stent in left arm, TIA    Family History: HTN, hyperlipidemia, heart disease, diabetes, cancer  Social History: Marital Status: married,   Children: 2,   Employment Status: disabled,   Patient Lives at: home,   Support System: excellent  Caffeine per Day: 4+  Seatbelt Use: 100 % of the time  Alcohol Use: none  Drug Use: none  Tobacco Usage:smoker  Cigarettes-Packs per Day- .5,     Previous Echocardiogram:  12/25/06-ECHO   SUMMARY   -  Overall left ventricular systolic function was normal. Left         ventricular ejection fraction was estimated to be 60 % in the         range of 55 % to 60 %. There were no left ventricular         regional wall motion abnormalities. There was an increased         relative contribution of atrial contraction to left         ventricular filling, consistent with diastolic  dysfunction.      Previous Heart Catheterization/PCI:  06/28/07-  1. Left heart catheterization.   2. Selective cholangiography.   3. Left ventriculogram.   4. Selective right femoral arteriography.   5. Ascending aorta arteriography.    FINAL DIAGNOSIS:     1. Diffuse non-occlusive coronary artery disease.  Patent stent and obtuse     marginal branch and diagonal branch and mid RCA with TIMI-3 flow.    04.18.08:  POSTPROCEDURE DIAGNOSES:     1. Moderate mid left anterior descending stenosis of 50-60% with known     critical fractional flow reserve 0.83.   2. Widely patent left circumflex and first diagonal stents.   3. Preserved left ventricular function with ejection fraction of 60-65% with     normal wall motion and normal filling pressures   4. Normal central aortic pressure 120/70.   5. Moderate left subclavian stenosis approximately 60% with 20 mm     translesional gradient.   6. Diffuse moderate aortoiliac disease without obvious renal artery stenosis.  CONCLUSIONS:     1. Moderate mid left anterior descending stenosis of 50-60% with known     critical fractional flow reserve 0.83.   2. Widely patent left circumflex and first diagonal stents.   3.  Preserved left ventricular function with ejection fraction of 60-65% with     normal wall motion and normal filling pressures   4. Normal central aortic pressure 120/70.   5. Moderate left subclavian stenosis approximately 60% with 20 mm     translesional gradient.   6. Diffuse moderate aortoiliac disease without obvious renal artery stenosis.    04.16.08  PROCEDURES PERFORMED:     1.  Aortogram with runoff.   2.  Selective renal angiography.   3.  Selective iliac artery injections.     IMPRESSION:     1.  The mild aortoiliac atherosclerosis with 30% distal aortic obstruction.   2.  A 30% obstruction of the left common iliac.   3.  A 70% obstruction at the ostium of the left renal artery.    04.09.07:  POSTOPERATIVE DIAGNOSIS(ES):     1.  Coronary arteries are free  of significant fixed disease.   2.  Diffuse left circumflex and LAD spasm without chest pain relieved with        nitroglycerin.   3.  Normal left ventricular function by angiography and by end diastolic   pressure.    10.30.06:     POSTOPERATIVE DIAGNOSIS(ES):     1.  Two-vessel coronary artery disease, manifested as moderate disease of the   LAD and severe in-stent restenosis of the circumflex.   2.  Normal left ventricular systolic function.   3.  No mitral regurgitation.   4.  Successful PTCA and stent of the circumflex OM in-stent restenosis with 2   separate TAXUS drug-eluting stents.    01.09.06:  ANGIOGRAPHIC RESULTS:    1.        The circumflex is nondominant, distributing as a large OM branch and            modest posterolateral branch.  There was a mid stent distal to the            takeoff of the PL branch, which had a focal mid in-stent 60%            obstruction that was reduced to 0% with angioplasty and stenting.            There was noted to be spasm intraprocedurally completed with            nitroglycerin.      IMPRESSION:    1.        Successful revascularization to the mid circumflex artery at a site of            previous in-stent restenosis.      Previous Nuclear Study:  07/03/07-Stress Test  FINAL INTERPRETATION There is normal myocardial perfusion.  Overall study quality is excellent.   Scan significance indicates low cardiac risk.    PERFUSION FINDINGS There is normal left ventricular perfusion.   There is no evidence of visual transient dilation with a normal stress/rest left ventricular volume ratio of 1.10.    The right ventricle is normal.  EKG DATA: Rest EKG: Sinus rhythm/Normal conduction/No arrhythmias/Normal repolarizationStress EKG: Sinus rhythm/No arrhythmias/Normal pattern/Nondiagnostic low heart rate EKG findings (only): Nondiagnostic EKG due to inadequate heart rate    12/25/06-Stress Test  IMPRESSION:    NO EVIDENCE OF STRESS-INDUCED REVERSIBLE MYOCARDIAL  ISCHEMIA.      Previous EKG/Holter/Event Monitor:  07/03/07-EKG  Ventricular Rate  67 BPM   Atrial Rate  67 BPM   P-R Interval  98 ms   QRS Duration  94 ms  QT  386 ms   QTc  407 ms   P Axis  11 degrees   R Axis  11 degrees   T Axis  1 degrees    Previous US/Vascular:  12/25/06-Venous Duplex  MPRESSION:    1.  ESTIMATED DIAMETER REDUCTION OF THE RIGHT INTERNAL CAROTID ARTERY IS 20-49%.    2.  ESTIMATED DIAMETER REDUCTION OF THE LEFT INTERNAL CAROTID ARTERY IS AT THE LOWER LIMITS OF 50-69%.    3.  BOTH VERTEBRAL ARTERIES ARE PATENT WITH ANTEGRADE FLOW.    THIS REPRESENTS NO SIGNIFICANT CHANGE COMPARED TO THE PREVIOUS STUDY DATED 05/18/06.    11.16.08  CAROTID DUPLEX STUDY     FINDINGS:  Brachial pressure is 122 mmHg on the right and 119 mmHg on the   left.     B-mode ultrasound reveals a large amount of nonechogenic plaque in both   carotid bulbs and proximal internal carotid arteries.  Doppler flow analysis   demonstrates a peak systolic velocity of 103 cm/second with an end-diastolic   velocity of 46 cm/second and severe spectral broadening in the right internal   carotid artery.  In the left internal carotid artery the peak systolic   velocity is 175 cm/second with an end-diastolic velocity of 86 cm/second and   severe spectral broadening. There is antegrade flow in both vertebral   arteries.     IMPRESSION:     1. Moderate right internal carotid artery stenosis in the 16-49% range.   2. Severe left internal carotid artery stenosis in the 50-79% range.        Previous Radiology:  06/28/07-Chest Xray  Impression:    No evidence of acute cardiopulmonary disease    New Problems:  STROKE (ICD-434.91)  TIA (ICD-435.9)    Coronary Risk Factor Assessment   Cigarette Smoker: smoker     Primary Care Physician:   Primary Care Provider: Ian Bushman, MD

## 2007-07-16 NOTE — Unmapped (Signed)
Signed by Collier Flowers MD on 07/16/2007 at 11:16:05    [facility]  CARDIOLOGY NEW PATIENT VISIT    Chief Complaint: Follow up hospitalization    History of Present Illness   Christinia M. Lauter is a 46 Years Old Female who was seen at the request of [employer].  16 y white female, with history of CAD, multiple PCIs/ Stents, Peripheral arterial disease, Carotid disease, Subclavian disease, hx of CVA, and continued Tobbaco abuse/ Nicotine addiction.    She is following up after her recent Hospital admission at [facility] for symptoms of chest pain. She was diagnosed with possible Coronary spasm. She has Intermediate stenosis in Mid LAD but rather diffuse disease. Her FFR accross that lesion in March at [facility] [place] was 0.83. Her meds were changed from atenolol to ccb/ Norvasc. Overall, she is doing fairly well. Denies any active angina. She is still smoking but wanting to quit.    She saw Dr Renato Gails, for Carotid disease and PAD. Her rest and exercise ABI were mildly abnl for right sided disease. She was advised to quit smoking, modify her risk factors.      She will f/u with Neurology at the end of this month.     Previous Echocardiogram:  12/25/06-ECHO   SUMMARY   -  Overall left ventricular systolic function was normal. Left         ventricular ejection fraction was estimated to be 60 % in the         range of 55 % to 60 %. There were no left ventricular         regional wall motion abnormalities. There was an increased         relative contribution of atrial contraction to left         ventricular filling, consistent with diastolic dysfunction.      Previous Heart Catheterization/PCI:  06/28/07-  1. Left heart catheterization.   2. Selective cholangiography.   3. Left ventriculogram.   4. Selective right femoral arteriography.   5. Ascending aorta arteriography.    FINAL DIAGNOSIS:     1. Diffuse non-occlusive coronary artery disease.  Patent stent and obtuse     marginal branch and diagonal branch and mid RCA  with TIMI-3 flow.    04.18.08:  POSTPROCEDURE DIAGNOSES:     1. Moderate mid left anterior descending stenosis of 50-60% with known     critical fractional flow reserve 0.83.   2. Widely patent left circumflex and first diagonal stents.   3. Preserved left ventricular function with ejection fraction of 60-65% with     normal wall motion and normal filling pressures   4. Normal central aortic pressure 120/70.   5. Moderate left subclavian stenosis approximately 60% with 20 mm     translesional gradient.   6. Diffuse moderate aortoiliac disease without obvious renal artery stenosis.  CONCLUSIONS:     1. Moderate mid left anterior descending stenosis of 50-60% with known     critical fractional flow reserve 0.83.   2. Widely patent left circumflex and first diagonal stents.   3. Preserved left ventricular function with ejection fraction of 60-65% with     normal wall motion and normal filling pressures   4. Normal central aortic pressure 120/70.   5. Moderate left subclavian stenosis approximately 60% with 20 mm     translesional gradient.   6. Diffuse moderate aortoiliac disease without obvious renal artery stenosis.    04.16.08  PROCEDURES PERFORMED:     1.  Aortogram with runoff.   2.  Selective renal angiography.   3.  Selective iliac artery injections.     IMPRESSION:     1.  The mild aortoiliac atherosclerosis with 30% distal aortic obstruction.   2.  A 30% obstruction of the left common iliac.   3.  A 70% obstruction at the ostium of the left renal artery.    04.09.07:  POSTOPERATIVE DIAGNOSIS(ES):     1.  Coronary arteries are free of significant fixed disease.   2.  Diffuse left circumflex and LAD spasm without chest pain relieved with        nitroglycerin.   3.  Normal left ventricular function by angiography and by end diastolic   pressure.    10.30.06:     POSTOPERATIVE DIAGNOSIS(ES):     1.  Two-vessel coronary artery disease, manifested as moderate disease of the   LAD and severe in-stent restenosis of  the circumflex.   2.  Normal left ventricular systolic function.   3.  No mitral regurgitation.   4.  Successful PTCA and stent of the circumflex OM in-stent restenosis with 2   separate TAXUS drug-eluting stents.    01.09.06:  ANGIOGRAPHIC RESULTS:    1.        The circumflex is nondominant, distributing as a large OM branch and            modest posterolateral branch.  There was a mid stent distal to the            takeoff of the PL branch, which had a focal mid in-stent 60%            obstruction that was reduced to 0% with angioplasty and stenting.            There was noted to be spasm intraprocedurally completed with            nitroglycerin.      IMPRESSION:    1.        Successful revascularization to the mid circumflex artery at a site of            previous in-stent restenosis.      Previous Nuclear Study:  07/03/07-Stress Test  FINAL INTERPRETATION There is normal myocardial perfusion.  Overall study quality is excellent.   Scan significance indicates low cardiac risk.    PERFUSION FINDINGS There is normal left ventricular perfusion.   There is no evidence of visual transient dilation with a normal stress/rest left ventricular volume ratio of 1.10.    The right ventricle is normal.  EKG DATA: Rest EKG: Sinus rhythm/Normal conduction/No arrhythmias/Normal repolarizationStress EKG: Sinus rhythm/No arrhythmias/Normal pattern/Nondiagnostic low heart rate EKG findings (only): Nondiagnostic EKG due to inadequate heart rate    12/25/06-Stress Test  IMPRESSION:    NO EVIDENCE OF STRESS-INDUCED REVERSIBLE MYOCARDIAL ISCHEMIA.      Previous EKG/Holter/Event Monitor:  07/03/07-EKG  Ventricular Rate  67 BPM   Atrial Rate  67 BPM   P-R Interval  98 ms   QRS Duration  94 ms   QT  386 ms   QTc  407 ms   P Axis  11 degrees   R Axis  11 degrees   T Axis  1 degrees    Previous US/Vascular:  12/25/06-Venous Duplex  MPRESSION:    1.  ESTIMATED DIAMETER REDUCTION OF THE RIGHT INTERNAL CAROTID ARTERY IS 20-49%.    2.  ESTIMATED  DIAMETER  REDUCTION OF THE LEFT INTERNAL CAROTID ARTERY IS AT THE LOWER LIMITS OF 50-69%.    3.  BOTH VERTEBRAL ARTERIES ARE PATENT WITH ANTEGRADE FLOW.    THIS REPRESENTS NO SIGNIFICANT CHANGE COMPARED TO THE PREVIOUS STUDY DATED 05/18/06.    11.16.08  CAROTID DUPLEX STUDY     FINDINGS:  Brachial pressure is 122 mmHg on the right and 119 mmHg on the   left.     B-mode ultrasound reveals a large amount of nonechogenic plaque in both   carotid bulbs and proximal internal carotid arteries.  Doppler flow analysis   demonstrates a peak systolic velocity of 103 cm/second with an end-diastolic   velocity of 46 cm/second and severe spectral broadening in the right internal   carotid artery.  In the left internal carotid artery the peak systolic   velocity is 175 cm/second with an end-diastolic velocity of 86 cm/second and   severe spectral broadening. There is antegrade flow in both vertebral   arteries.     IMPRESSION:     1. Moderate right internal carotid artery stenosis in the 16-49% range.   2. Severe left internal carotid artery stenosis in the 50-79% range.        Previous Radiology:  06/28/07-Chest Xray  Impression:    No evidence of acute cardiopulmonary disease    Past History  Past Medical History (reviewed - no changes required):  Coronary Artery Disease, Hyperlipidemia, Hypertension, Depression, Stroke, Transcient Ischemic Attack (TIA)  Surgical History (reviewed - no changes required):  Coronary Stent: x7, 1 stent in left arm, TIA    Family History (reviewed - no changes required): HTN, hyperlipidemia, heart disease, diabetes, cancer  Social History (reviewed - no changes required): Marital Status: married,   Children: 2,   Employment Status: disabled,   Patient Lives at: home,   Support System: excellent  Caffeine per Day: 4+  Seatbelt Use: 100 % of the time  Alcohol Use: none  Drug Use: none  Tobacco Usage:smoker  Cigarettes-Packs per Day- currently <.5 ppd,     Coronary Risk Factor Assessment   Cigarette  Smoker: smoker   HBP: no   Hyperlipidemia: yes   Goals:     LDL Goal: <70  Diabetes: no   Family History of CAD: yes    Peripheral Vascular Disease: yes   Physical Activity: inactive     Labs Reviewed    Review of Systems  See scanned document dated July 16, 2007.      Intake   Intake Comments: Follow up hospitalization    Problems  PRINZMETAL'S ANGINA (ICD-413.1)  STATUS, PTCA (ICD-V45.82)  TOBACCO ABUSE (ICD-305.1)  STROKE (ICD-434.91)  TIA (ICD-435.9)  CLAUDICATION, INTERMITTENT (ICD-443.9)  HYPERLIPIDEMIA (ICD-272.4)  CAROTID ARTERY DISEASE (ICD-433.10)  HYPERTENSION (ICD-401.9)  DEPRESSION (ICD-311)  CAD (ICD-414.00)  HEADACHE (ICD-784.0)  SPELLS (ICD-780.39)    Medications   PLAVIX 75 MG TABS (CLOPIDOGREL BISULFATE) 1 qd  ASPIRIN 81 MG TBEC (ASPIRIN) 1 qd  EFFEXOR XR 75 MG CP24 (VENLAFAXINE HCL) 1 qd  PRINIVIL 10 MG TABS (LISINOPRIL) 1 qd  FOLIC ACID 1 MG TABS (FOLIC ACID) 1 qd  ISOSORBIDE MONONITRATE CR 60 MG TB24 (ISOSORBIDE MONONITRATE) 1 qd  XANAX 1 MG TABS (ALPRAZOLAM) 1 by mouth twice a day as needed  LIPITOR 80 MG TABS (ATORVASTATIN CALCIUM) daily  TRICOR 145 MG TABS (FENOFIBRATE) qd  NITROGLYCERIN SUBL (NITROGLYCERIN SUBL) 0.4mg  as needed  ATENOLOL 25 MG TABS (ATENOLOL) one by mouth daily    Allergies  No Known Allergies  Vital Signs   Height: 61 in.  Weight: 163.4 lbs. Prev Weight: 163 lbs. Weight Change Since Last Visit: 0.40 lbs.  BMI (in-lb): 30.99  Pulse rate: 64   Blood Pressure   BP #1: 136 / 78mm Hg  Cuff Size: Std #1: right arm   BP #2: 130 / 72 mm Hg Cuff Size: Std site: left arm     Pulse Oximetry   O2 Saturation: 98% room air at rest     Intake recorded by: Jewel Baize RMA  July 16, 2007 10:13 AM      PHYSICAL EXAMINATION  Constitutional: alert, no acute distress, well hydrated, well developed, well nourished.   Skin: normal color, no rashes, no unusual bruising, warm to touch.   Head: atraumatic, normocephalic.   Eyes: pupils equal, no injection, no icterus.   Ears/Nose/Throat:  external ears normal, external nose normal, hearing normal.   Neck: no thyromegaly. right sided bruit > left  Chest: no deformities, no apparent respiratory distress, normal percussion, no chest wall tenderness, normal breath sounds.        Right Lower: diminished breath sounds.        Left Lower: diminished breath sounds.   Neck Veins: not distended.   Carotids: normal upstroke, no bruits.   Palpation: normal.   S1 S2: normal S1 S2.   Extra Sounds: no gallop or rub or click.   Rhythm: regular rate and rhythm.   Ectopy: without ectopy.   Bilateral Lower Extremity Edema: none.   Murmur: yes.     Grade: 1/6.     Type: systolic murmur.     Location: fourth intercostal space.   Radial - Right: diminished.   Radial - Left: diminished.   Femoral - Right: normal.   Femoral - Left: normal.   Popliteal - Right: diminished.   Popliteal - Left: diminished.   Abdomen: nondistended, nontender, normal BS, no organomegaly, no prominent aortic pulsation.   Spine: normal mobility, no deformities.   Extremities: full joint motion, no deformities.   Neuro: cranial nerves grossly intact, non focal.   Psych: affect and mood appropriate, normal interaction.     EKG Interpretation:   No EKG done at this visit.      Assessment and Plan     Problems Assessed Today:   1. CAD (ICD-414.00) with hx of STATUS PTCA (ICD-V45.82) and recent admission with possible Coronary spasm/ PRINZMETAL'S ANGINA (ICD-413.1)---- Continue asa, nitrates, ccb, statins. Quit smoking. Pt will stop atenolol and switch to ccb/ Norvasc.  2. TOBACCO ABUSE (ICD-305.1) ---------- Most of her problems are related to smoking/ tobbaco abuse. I counselled her to quit smoking and gave her materials about the programs at [facility] to help her quit it.  3. CAROTID ARTERY DISEASE (ICD-433.10) and STROKE (ICD-434.91) --- Willl see neurology at the end of the month. Also follows Vascular Clinic.  4. CLAUDICATION INTERMITTENT (ICD-443.9) ------- sees Vascular  5.HYPERLIPIDEMIA  (ICD-272.4) --------- currently she is taking Lipitor 80, Tricor, and Zetia.  Triple therapy puts her at high risk of myopathy or liver problems. I told her to discuss with PC and possibly stop Zetia.  Repeat Lipids, LFTs, CPK in 4-6 weeks after that.   All non-cardiac problems referred to primary care.    Medications   New Prescriptions/Refills:  NITROGLYCERIN SUBL (NITROGLYCERIN SUBL) 0.4mg  as needed  #20 x 2, 07/16/2007, Barbra Sarks RN      Today's Orders   Lipid Profile   (FATS) (7600) [CPT-80061]  Hepatic Function    (  LIVP) (10256) [CPT-80076]    DISPOSITION:    Return to clinic in 6 month(s)        CC:    Ian Bushman, MD  Helane Rima                Prescriptions:  NITROGLYCERIN SUBL (NITROGLYCERIN SUBL) 0.4mg  as needed  #20 x 2   Entered by: Barbra Sarks RN   Authorized by: Collier Flowers MD   Signed by: Barbra Sarks RN on 07/16/2007   Method used: Print then Give to Patient   RxID: [id number]    ]

## 2007-07-16 NOTE — Unmapped (Signed)
Signed by Collier Flowers MD on 07/16/2007 at 00:00:00  ROS      Imported By: Betsey Amen 07/29/2007 13:33:35    _____________________________________________________________________    External Attachment:    Please see [employer] EMR for this document.

## 2007-07-18 NOTE — Unmapped (Signed)
THE [facility]     PATIENT NAME:   Tracie Romero, Tracie Romero               MR #:  [id number]   DATE OF BIRTH:  1960-10-27                        ACCOUNT #:  [id number]   PHYSICIAN:      Collier Flowers, M.D.                  ROOM #:  [id number]   SERVICE:        Internal Medicine/Cardiology      NURSING UNIT:  [facility]   PRIMARY:        Helane Rima, D.O.           FC:  C   REFERRING:      Selected Referral Pt              ADMIT DATE:  06/28/2007   DICTATED BY:    Harvest Forest, M.D.            PROCEDURE DATE:                                                     DISCHARGE DATE:  06/30/2007                                     PROCEDURE/OTHER     ***DEMOGRAPHIC REVISION/ATTENDING - 07/15/07 -[facility]***     PROCEDURE:     1. Left heart catheterization.     ATTENDING PHYSICIAN:     1. Dr. Collier Flowers.   2. Dr. Shawn Route.     PROCEDURE PERFORMED     1. Left heart catheterization.   2. Selective cholangiography.   3. Left ventriculogram.   4. Selective right femoral arteriography.   5. Ascending aorta arteriography.     INDICATION OF PROCEDURE:  A 46 year old female with chest pain and ST   elevation MI in lateral lead.     DETAILS OF PROCEDURE:  The patient was brought to the [facility] via   catheterization laboratory in an urgent situation and prepped and draped in   usual sterile fashion for intended access to the right femoral artery.  The   risks, benefits and complications were explained to the patient and all   questions were answered and informed consent was obtained.  Lidocaine 2% was   injected subcutaneously to achieve local anesthesia.  The right femoral   artery was accessed via modified Seldinger technique without difficulty.  The   6 French arterial sheath was advanced over the wire into the artery and   aspirated and flushed without difficulty.  A JL4 catheter was advanced over   the wire and visualized under fluoroscopy and used to selectively engage left   coronary ostia.   Multiple images of left coronary tree were obtained.     Next, The JL4 catheter was removed and exchanged over the wire with a JR4   catheter.  The JR4 catheter was used selectively to engage the right coronary   ostia.  Images of the right coronary tree were obtained.  Then the JR4   catheter was removed, and  it was exchanged with a wire with a pigtail   catheter.  The pigtail catheter was advanced over the guide wire, visualized   under fluoroscopy, and used to cross the aortic valve and entered the left   ventricular cavity.  Left ventricular end diastolic pressure was measured.  A   left ventriculogram was performed using a power injector.  The pigtail   catheter was pulled back across the aortic valve demonstrating no gradient.   The pigtail catheter was placed in ascending aorta and ascending aorta   arteriography was performed using power injector.  The right femoral   arteriography was done using arterial sheath.  At the end of the procedure,   the patient remained stable and no complication was noted.  The patient was   sent to the CCU with sheath removal protocol.     PROCEDURE FINDINGS:     1. Left main coronary artery is a large caliber artery and is   angiographically normal.  Left main bifurcates to left circumflex artery and   left anterior descending artery.   2. Left anterior descending artery is a large caliber vessel.  LAD is   diffusely diseased.  There is atherosclerotic lesion in mid LAD with   approximately 60% stenosis.  Of note, aFFR has been performed on this mid LAD   lesion at [facility] in April, 2008, which showed FFR of 0.8 which was   not clinically significant.  LAD gives [place] two diagonal branches.  There is a   stent in first diagonal branch, which is widely patent and has TIMI-3 flow.   LAD is diffusely diseased.   3. Left circumflex artery is large caliber vessel, gives of two obtuse   marginal branches.  First OM has stent, which is likely patent and had TIMI-3   flow.  At  the bifurcation of the OM, there is a 40 to 50% stenotic lesion.   There is a post stent stenosis 40 percent.  Left circumflex is diffusely   diseased.   4. Right coronary artery is a dominant large vessel artery, which gives of   PLV and PDA branch, it is patent in its mid segment with TIMI-3 flow.   5. Left ventriculogram findings, ejection fraction estimated at 65%.  No   regional wall motion abnormality was noted.   6. Ventricular end diastolic pressure was 15.   7. Gradient across the aortic valve was none.   8. Ascending aorta arteriography showed no signs of aortic dissection.   9. Right common femoral arteriography showed severe diffuse disease in iliac   and common femoral artery.  There is diffuse disease in the right iliac and   common femoral artery.  Common femoral artery has severe stenosis.     FINAL DIAGNOSIS:     1. Diffuse non-occlusive coronary artery disease.  Patent stent and obtuse   marginal branch and diagonal branch and mid RCA with TIMI-3 flow.     PLAN:     1. We will transfer the patient to CCU and will treat her with optimum   medical therapy and aggressive risk factor modification.     Dr. Richardine Service was present during the procedure and supervised the whole procedure.  _______________________________________   RM/am                                  _____   D:  06/28/2007 22:43                  Collier Flowers, M.D.   T:  06/29/2007 02:22                  Dictated by:  Harvest Forest, M.D.   R:  07/15/2007 13:59 - ths   Job #:  [id number]                                         PROCEDURE/OTHER                                        COPY                    PAGE    1 of   1   R:  07/15/2007 13:59 - ths   Job #:  [id number]                                         PROCEDURE/OTHER                                        COPY                    PAGE    1 of   1

## 2007-07-22 NOTE — Unmapped (Addendum)
Signed by Justice Rocher MA on 07/22/2007 at 14:42:54        [facility]   [address]  [place], [place] [address]   [phone]  Fax: [phone]             July 04, 2007          RE: Tracie Romero   DOB:  [birth year]        Dear Ian Bushman, M.D.,    I had the pleasure of seeing your patient, Tracie Romero, at your request for surgical evaluation in our Vascular Surgery office. Please see my Assessment and Plan below as well as the attached office note.    46 y/o female with recurrent episodes of left hemispheric TIA-like symptoms that appear to be occurring in the setting of low blood pressure. Her CTA does not demonstrate significant change when compared to her previous CTA and reveals  40-50% stenosis which would be treated medically at this time. She does have headache whenever these episodes occur - perhaps there is a migraine aura as an etiology. I have asked her to seek assistance with her Neurologist. In addition, consideration should be given to keeping her blood pressure higher to see if this might improve her symptoms.     MIld right lower extremity claudication. Her ABI drops to .90 from 0.98 with exercise - remaining in the mild claudication range. The left leg is normal at rest and with exercise. I would not recommend intervention at this time. Suggested to the patient that smoking cessation is in her best interest given her coronary and carotid disease.    Thank you for allowing me to see this patient.  I will keep you informed of their progress.    Best personal regards,        Cyerra Yim B. Renato Gails, MD, FACS  Associate Professor of Surgery  Division of Vascular Surgery  [email or web address]        CC  Sherilyn Dacosta, M.D.  Marda Stalker, M.D.      Signed by Justice Rocher MA on 07/22/2007 at 14:42:54    Routed to Dr. Orlin Hilding. Other two physicians faxed 07/05/07

## 2007-07-31 NOTE — Unmapped (Signed)
Signed by Antonieta Loveless MD on 07/31/2007 at 00:00:00  Neurology      Imported By: Heath Gold 08/20/2007 15:13:20    _____________________________________________________________________    External Attachment:    Please see [facility] EMR for this document.

## 2007-08-15 NOTE — Unmapped (Signed)
Signed by Verna Czech on 08/15/2007 at 15:16:47    Phone Note   Other Incoming Call    Call From: DR Clydia Llano Name: Tracie Romero Phone #: [phone]  Call For: [name]  Reason for Call: talk with nurse  Summary of call: DR FLAHERTY'S OFFICE CALLING FOR LABS POSSIBLY DONE LAST OCT.  DR HAD SUGGESTED THAT DR Phoebe Sharps DO THE LABS ON THIS MUTUAL PT. FAX TO [phone]. CALL AND LET THEM KNOW IF YOU HAVE THEM    This department is not currently using the EMR system. Please see the paper chart for this patient for the response to this phone message.  Initial call taken by: Verna Czech,  August 15, 2007 3:16 PM

## 2007-08-15 NOTE — Unmapped (Signed)
Signed by Chipper Oman on 08/24/2007 at 20:50:44    [facility]            [facility]    Healing ??? Teaching ??? Leading     [address], [address]  [place], [place] [address]  Ph:   [phone]  Fax: [phone]            August 15, 2007        Ian Bushman, M.D.  Dr. Jennette Kettle, [facility]  Sherilyn Dacosta, M.D.  Colbert Ewing, M.D.    RE: [name]   DOB:  02-06-1961    I had the pleasure of seeing your patient, Tracie Romero, in follow-up in the [facility] on 08/15/2007.     History of Present Illness:  Tracie Romero is a 46 year old woman here for spells and headaches.  I saw her as a consultation in October 2007 and her history is presented in detail there.  In short, my conclusion was that Tracie Romero had difficult-to-define headaches, but probably migraine, medication rebound headache, and nonorganic neurologic findings on examination.  I recommended a number of tests which I do not think have been performed.  I also suggested psychiatric referral, but she was not interested.  I discussed a trial of a prophylactic medication for headache, but she was not interested.    Today, she returns because of recurring symptoms.  She is having fairly stereotyped spells which involve right-sided numbness, sometimes slurred speech, and sometimes loss of train of thought.  Her husband says they happen every other month.  She has had several hospital admissions.  She describes them as TIA's, but my previous evaluation disclosed no evidence that she has cerebrovascular disease or prior stroke.    She also continues to have chronic daily headache.  Previously, she described them as right sided.  Today, she describes them as occipital radiating forward.  She takes large amounts of over-the-counter analgesics each day.    She has known heart disease and has been in and out of the hospital for chest pain.  She also has asymptomatic carotid stenosis and is  being followed by Dr. Renato Gails.      Past Medical History:  1. Coronary artery disease.  2. Peripheral vascular disease.  3. Hypertension.  4. Hyperlipidemia.  5. Depression.  6. Chronic daily headache.  7. Other symptoms as above.    Review of Systems:  I reviewed the patient data form.      Examination:  She appeared generally well, but somewhat flat.  Her gait was slow, but otherwise not diagnostic.  Visual fields were full.  Pupils were equal and reactive.  I saw no papilledema.  Extraocular movements were full.  Face was symmetric.  Strength was normal on the left.  On the right, she had give away weakness.  Toes were downgoing.    Radiographs:  She had another MRI of the brain in March 2008 with and without contrast which I reviewed.  I saw no intracranial disease.  She had a head CT on June 29, 2007, which I reviewed.  It was normal.      She had a CTA in September 2008 interpreted as showing long segment 40-50% stenoses of both proximal ICA's.    Assessment:    1. 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  Low clinical suspicion for an ominous cause such as vasculitis.  2. Give away weakness on  the right.  3. Spells.  I find no evidence she has ever had a stroke and I would not characterize her current spells as TIAs.  There is some functional overlay to her presentation.      Plan:  1. We will pursue the tests suggested previously including ESR, ANA, TPO antibodies, B12, TSH, RPR, HIV, anticardiolipin antibodies, lupus anticoagulant, and EEG, if not already done.  2. I suggest she visit with a psychotherapist.  3. She wanted to try a prophylactic medication for headache.  We decided upon topiramate.  Prescription and instruction sheet provided.  Potential side effects discussed.    4. I advised her to cut back significantly on her analgesic use.  5. Return to clinic in two months.    Thank you for allowing me to participate in the care of this pleasant patient.  If  you would like a copy of my office note, please contact our Medical Records department at [phone].  Please feel free to contact me should you have any concerns or questions.        Burnett Harry, M.D.    cc: [facility]   [address]   [place], [place]  [address]

## 2007-08-15 NOTE — Unmapped (Signed)
Signed by Antonieta Loveless MD on 08/21/2007 at 12:37:04    NEUROLOGY GENERAL VISIT    HPI General   Chief Complaint: Spells and headaches  Patient's medications reviewed    History of Present Illness:   Tracie Romero is a 46 year old woman here for spells and headaches.  I saw her as a consultation in October 2007 and her history is presented in detail there.  In short, my conclusion was that Tracie Romero had difficult-to-define headaches, but probably migraine, medication rebound headache, and nonorganic neurologic findings on examination.  I recommended a number of tests which I do not think have been performed.  I also suggested psychiatric referral, but she was not interested.  I discussed a trial of a prophylactic medication for headache, but she was not interested.    Today, she returns because of recurring symptoms.  She is having fairly stereotyped spells which involve right-sided numbness, sometimes slurred speech, and sometimes loss of train of thought.  Her husband says they happen every other month.  She has had several hospital admissions.  She describes them as TIA's, but my previous evaluation disclosed no evidence that she has cerebrovascular disease or prior stroke.    She also continues to have chronic daily headache.  Previously, she described them as right sided.  Today, she describes them as occipital radiating forward.  She takes large amounts of over-the-counter analgesics each day.    She has known heart disease and has been in and out of the hospital for chest pain.  She also has asymptomatic carotid stenosis and is being followed by Dr. Renato Gails.      Past Medical History:  1. Coronary artery disease.  2. Peripheral vascular disease.  3. Hypertension.  4. Hyperlipidemia.  5. Depression.  6. Chronic daily headache.  7. Other symptoms as above.    Review of Systems:  I reviewed the patient data form.      Examination:  She appeared generally well, but somewhat flat.  Her gait was slow, but  otherwise not diagnostic.  Visual fields were full.  Pupils were equal and reactive.  I saw no papilledema.  Extraocular movements were full.  Face was symmetric.  Strength was normal on the left.  On the right,  she had give away weakness.  Toes were downgoing.    Radiographs:  She had another MRI of the brain in March 2008 with and without contrast which I reviewed.  I saw no intracranial disease.  She had a head CT on June 29, 2007, which I reviewed.  It was normal.      She had a CTA in September 2008 interpreted as showing long segment 40-50% stenoses of both proximal ICA's.      Past History  Family History (reviewed - no changes required): HTN, hyperlipidemia, heart disease, diabetes, cancer  Social History (reviewed - no changes required): Marital Status: married,   Children: 2,   Employment Status: disabled,   Patient Lives at: home,   Support System: excellent  Caffeine per Day: 4+  Seatbelt Use: 100 % of the time  Alcohol Use: none  Drug Use: none  Tobacco Usage:smoker  Cigarettes-Packs per Day- currently <.5 ppd,     Review of Systems       Intake-Neurology   Chief Complaint: Spells and headaches  Handedness: right    Vital Signs   Height: 61 inches  Weight: 165 pounds  Pulse rate: 80 Pulse rhythm: regular Respirations: 16  Blood Pressure: Standard  BP #1: 146 / 86mm Hg   BMI: 31.29  BSA: 1.74  Wt chg: 1.60      Visual Exam:   Corrective Lenses: none    Allergies  No Known Allergies  New Medication:  TOPAMAX 25 MG  TABS (TOPIRAMATE) One by mouth daily.  Increase as directed.    Intake recorded by: Tawanna Sat MA  August 15, 2007 2:40 PM            Medications   New Prescriptions/Refills:  TOPAMAX 25 MG  TABS (TOPIRAMATE) One by mouth daily.  Increase as directed.  #120 x 3, 08/15/2007, Antonieta Loveless MD    New medications:  TOPAMAX 25 MG  TABS -- One by mouth daily.  Increase as directed.  Start date: 08/15/2007    Assessment    1. 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  Low clinical suspicion for an ominous cause such as vasculitis.  2. Give away weakness on the right.  3. Spells.  I find no evidence she has ever had a stroke and I would not characterize her current spells as TIAs.  There is some functional overlay to her presentation.    Plan    1. We will pursue the tests suggested previously including ESR, ANA, TPO antibodies, B12, TSH, RPR, HIV, anticardiolipin antibodies, lupus anticoagulant, and EEG, if not already done.  2. I suggest she visit with a psychotherapist.  3. She wanted to try a prophylactic medication for headache.  We decided upon topiramate.  Prescription and instruction sheet provided.  Potential side effects discussed.    4. I advised her to cut back significantly on her analgesic use.  5. Return to clinic in two months.    Today's Orders   Referred by:   [[id number]]  74259 - Ofc Vst, Est Level V [CPT-99215]  EEG evaluation [CPT-95950]  ANA     (ANA) (249) [CPT-86038]  Anti - Cardiolipin Antibody (ANTICA) (56387) [FIE-33295]  B-12  (B12)  (927) [CPT-82607]  HIV 1/2 EIA Antibody Screen with Reflexes (18841) [CPT-86703]  Sedimentation Rate   (ESR) (809) [CPT-85651]  Syphilis  (SYPHILIS) (66063) [CPT-86592]  Thyroid Peroxidase  (TPO) (5081) [CPT-86376]  Lupus Anticoagulant Evaluation with Reflex (7079) [CPT-85613/85730]  TSH   (TSH) (899) [KZS-01093]    Disposition/Follow Up:   Return to clinic in 2 months  Suggested date of next visit: 10/15/2007  Comments: High complexity, high risk case (patient presented with diagnosis of recurrent TIA).  Multiple outside records reviewed.    Risks & Benefits:   * Risks, benefits and treatment options discussed with patient.    Counseling: The following items were discussed with the patient:     Epilepsy/Pregnancy: The possible effects of antiepileptic medications on pregnancy include but not limited to neural tube defects, heart defects, or bone growth problems.  Antiepileptic medications, for  example valproic acid, or carbamazepine may increase the chance of neural tube defects as high as 5 to 10%; this chance may be even higher if multiple medications are prescribed at the same time.  There are risks of having seizures during pregnancy including but not limited to pregnancy loss, trauma, or intrauterine growth retardation due to hypoxia associated with grand mal seizures. The patient was counseled on the overall risk benefit of the medication regimen during pregnancy       Common Side Effects: Antiepileptic medications may cause somnolence, fatigue, dizziness, and cognitive side effects.  Some of the medications can rarely cause bone  marrow failure and liver failure.  Specific side effects of the antiepileptic medications that the patient is currently taking or that the patient will start taking were also discussed with the patient.      CC:   Ian Bushman, MD  Dr. Jennette Kettle, [facility] (not Dr. Lysle Morales of [facility])  Patient  Dr. Matthias Hughs  Dr. Colbert Ewing    Prescriptions:  TOPAMAX 25 MG  TABS (TOPIRAMATE) One by mouth daily.  Increase as directed.  #120 x 3   Entered and Authorized by: Antonieta Loveless MD   Signed by: Antonieta Loveless MD on 08/15/2007   Method used: Print then Give to Patient   RxID: [id number]

## 2007-08-17 NOTE — Unmapped (Signed)
Signed by Collier Flowers MD on 08/17/2007 at 00:00:00  Lab Report      Imported By: Betsey Amen 02/28/2008 15:51:59    _____________________________________________________________________    External Attachment:    Please see [facility] EMR for this document.

## 2007-08-17 NOTE — Unmapped (Signed)
Signed by Collier Flowers MD on 08/17/2007 at 00:00:00  Lab Report      Imported By: Maryellen Pile 09/11/2007 10:11:16    _____________________________________________________________________    External Attachment:    Please see [facility] EMR for this document.

## 2007-08-21 NOTE — Unmapped (Signed)
Signed by   LinkLogic on 08/23/2007 at 16:59:08  Patient: Tracie Romero  Note: All result statuses are Final unless otherwise noted.    Tests: (1) EMERGENCY DEPARTMENT REPORT (ER)    Order Note: PATIENT NAME                        PA #                 MR #  [name], [name]                  [id number]           [id number]  EMERGENCY ROOM PHYSICIAN                          ADM DATE  Amalia Greenhouse, MD                                 08/21/2007  DATE OF BIRTH        AGE               PATIENT TYPE          RM #  03-Nov-1960           46                ERQ    ADDENDUM    DIAGNOSTIC STUDIES:  Includes an EKG which shows normal sinus rhythm, heart  rate 74.  There is normal axis, normal intervals.  There were no ST  changes.  Her head CT was unremarkable.  CBC was unremarkable.  Chemistries  were unremarkable except for a CO2 of 20.  Liver profile was unremarkable.  Drug screen shows positive benzodiazepines.  Her coagulation studies were  normal.  Troponin was normal.  Urinalysis was normal.  Chest x-ray was  unremarkable.  No acute infiltrates, no widened mediastinum, no  pneumothorax.    EMERGENCY DEPARTMENT COURSE:  While in the emergency department, the  patient did gradually become more awake.  She did not respond to Narcan.  Her finger sticks were normal.  At this point, she presents with  unresponsiveness.  I suggest that this is most likely sedation secondary to  benzodiazepines.  She does take alprazolam, and the family seems to feel  there may be an additional sleeping pill that she is taking at home that  she did not bring with her.  I asked the patient if she had any suicidal or  homicidal ideation.  She said no.  There was no intention of drug overdose.  The husband actually claimed that she has lethargic like this for several  days.  They are going to watch her medication intake.  She will be  discharged.  I did speak with Dr. _____ who recommended the patient  followup tomorrow with Dr. Phoebe Sharps.  At  this point, the patient has  family that will watch her very closely.  The patient did not DICTATION  ENDS HERE.                                                   Amalia Greenhouse, MD    TC/sst  D:  08/21/2007  7:41 P  T:  08/22/2007 10:59 P     Job # [id number]  CS # [id number]  cc:    Amalia Greenhouse, MD         Harless Litten, DO    Confidential Patient Information: Accompanied are [facility] results that   are being delivered by [facility].  If you receive a clinical result for a patient that is not yours please   contact [facility] at [phone].    Order Note: **SIGNED LEGAL DOCUMENT IS LOCATED IN DOCVIEW**    Note: An exclamation mark (!) indicates a result that was not dispersed into   the flowsheet.  Document Creation Date: 08/23/2007 4:59 PM  _______________________________________________________________________    (1) Order result status: Final  Collection or observation date-time: 08/21/2007  Requested date-time:   Receipt date-time:   Reported date-time:   Referring Physician: Amalia Greenhouse  Ordering Physician:  Reviewed In Hospital [facility])  Specimen Source:   Source: Francesco Sor Order Number: [id number] TRANSCRIPTION  Lab site:

## 2007-08-21 NOTE — Unmapped (Signed)
Signed by   LinkLogic on 08/22/2007 at 15:28:16  Patient: Tracie Romero  Note: All result statuses are Final unless otherwise noted.    Tests: (1) CT HEAD W/O CON (54098)    Order Note:     Order Note: Confidential Patient Information: Accompanied are [facility]   results that are being delivered by [facility].   If you receive a clinical result for a patient that is not yours please   contact [facility] at [phone].    Order Note:      CT SCAN OF THE BRAIN WITHOUT CONTRAST- 08/21/07.     REASON FOR STUDY- Patient unresponsive.     COMPARISON- April 10, 2007.     FINDINGS- Axial images of the brain were obtained from the skull  base through the vertex. No contrast was administered.     There are no extra axial fluid collections. There is no acute  intracranial bleeding. There are no masses or mass effect. The skull  is intact. The paranasal sinuses are normally aerated and  pneumatized. There has been no significant interval change when  compared with the previous study.     IMPRESSION- STABLE CT SCAN OF THE BRAIN. NO ACUTE BRAIN  ABNORMALITIES ARE IDENTIFIED.     The emergency room was notified of the above findings as per  protocol.     The final impression agrees with the preliminary report.     GM/bd  D-  08/22/07  T-  08/22/07                      Read ByAnnie Main  M.D.       Released ByAnnie Main  M.D.       Released Date Time- 08/22/07 1514          Transcriptionist- Zoe Lan     ------------------------------------------------------------------------------      ! CT HEAD W/O CON           Result Below...        RESULT: See Report for Impression  (R)      Non-EMR Ordering Provider: Inda Castle,   MD    Note: An exclamation mark (!) indicates a result that was not dispersed into   the flowsheet.  Document Creation Date: 08/22/2007 3:28 PM  _______________________________________________________________________    (1) Order result status: Final  Collection or observation date-time:  08/21/2007 18:02  Requested date-time:   Receipt date-time:   Reported date-time: 08/22/2007 07:23  Referring Physician:    Ordering Physician:  Non-EMR Physician [facility])  Specimen Source:   Source: [name] Order Number: [id number] RADIOLOGY  Lab site:

## 2007-08-21 NOTE — Unmapped (Signed)
Signed by   LinkLogic on 08/26/2007 at 17:26:23  Patient: Tracie Romero  Note: All result statuses are Final unless otherwise noted.    Tests: (1) EMERGENCY DEPARTMENT REPORT (ER)    Order Note: PATIENT NAME                        PA #                 MR #  Tracie Romero, Tracie Romero                  [id number]           [id number]  EMERGENCY ROOM PHYSICIAN                          ADM DATE  Amalia Greenhouse, MD                                 08/21/2007  DATE OF BIRTH        AGE               PATIENT TYPE          RM #  October 23, 1960           46                ERQ    CHIEF COMPLAINT:  Unobtainable. The patient was found unresponsive by the  paramedics.    HISTORY OF PRESENT ILLNESS:  The patient is a 46 year old white female who  presents with unresponsiveness. The patient is unresponsive and I did not  get any history whatsoever.    PAST MEDICAL HISTORY: Significant for hypertension, hyperlipidemia. She has  had possible TIA versus CVA. 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 Carotid  stenosis, peripheral vascular disease, depression. This is per an old  dictation by Dr. Hortencia Conradi found in [facility].    MEDICATIONS:  Pending. Her medication sheet from [facility] has been  reviewed. We are authenticating this to see if it is current.    ALLERGIES:  No known drug allergies.    SOCIAL HISTORY:  She is a nonsmoker. She does drink alcohol. She is  married. Apparently does not use illicit drugs. This is again from a former  dictation in [facility].    FAMILY HISTORY:   Coronary artery disease, hypertension.    REVIEW OF SYSTEMS: Unobtainable. The patient is unresponsive, cannot get a  review of systems.    PHYSICAL EXAMINATION:  GENERAL APPEARANCE:  The patient was unresponsive to sternal rub.  VITAL SIGNS:  Documented, the chart has been reviewed. She was breathing on  her own.  HEENT:  Pupils are equally round. Extraocular movements cannot be assessed.  Coronal _____ were intact. Oropharynx, gag reflex was intact. Scalp  revealed no signs of  trauma.  NECK:   Supple with minimal rigidity.  LUNGS:  Clear with symmetrical breath sounds. Good breath sounds  bilaterally. No wheezing or rhonchi. Respirations ___________.  HEART: Normal S1, S2, __________.  ABDOMEN:   Soft, nontender. No masses, rebound or splenomegaly. She was  nondistended.    MEDICAL DECISION MAKING:  She did not respond to sternal rub. She did not  respond to ammonia capsule. She does not withdraw to pain. Patellar  reflexes were 0. Toes were downgoing.    DICTATION ENDS HERE.  Amalia Greenhouse, MD    TC/sst  D:  08/21/2007  4:42 P     T:  08/22/2007  3:43 P     Job # [id number]  CS # [id number]  cc:    Amalia Greenhouse, MD         Harless Litten, DO    Confidential Patient Information: Accompanied are [facility] results that   are being delivered by [facility].  If you receive a clinical result for a patient that is not yours please   contact [facility] at [phone].    Order Note: **SIGNED LEGAL DOCUMENT IS LOCATED IN DOCVIEW**    Note: An exclamation mark (!) indicates a result that was not dispersed into   the flowsheet.  Document Creation Date: 08/26/2007 5:26 PM  _______________________________________________________________________    (1) Order result status: Corrected  Collection or observation date-time: 08/21/2007  Requested date-time:   Receipt date-time:   Reported date-time:   Referring Physician: Amalia Greenhouse  Ordering Physician:  Reviewed In Hospital [facility])  Specimen Source:   Source: Francesco Sor Order Number: [id number] TRANSCRIPTION  Lab site:

## 2007-08-21 NOTE — Unmapped (Signed)
Signed by   LinkLogic on 08/22/2007 at 16:37:56  Patient: Tracie Romero  Note: All result statuses are Final unless otherwise noted.    Tests: (1) DX CHEST PORTABLE PA, 1V (10240)    Order Note:     Order Note: Confidential Patient Information: Accompanied are [facility]   results that are being delivered by [facility].   If you receive a clinical result for a patient that is not yours please   contact [facility] at [phone].    Order Note:         PORTABLE AP CHEST-  08-21-07 at 1717 hours.     HISTORY-  Unresponsive.     FINDINGS-  Comparison with the study dating 06-25-07.  Lungs are free  of an active process.  Cardiac silhouette and pulmonary vessels are  normal.     IMPRESSION-  NO ACTIVE DISEASE.              GDH/jh  D- 08/22/2007  T- 08/22/2007          Read By- Eusebio Me M.D.       Released ByEusebio Me M.D.       Released Date Time- 08/22/07 1549          Transcriptionist- JONATHAN CEPHAS     ------------------------------------------------------------------------------      ! DX CHEST PORTABLE PA, 1V                              Result Below...        RESULT: See Report for Impression  (R)      Non-EMR Ordering Provider: Inda Castle,   MD    Note: An exclamation mark (!) indicates a result that was not dispersed into   the flowsheet.  Document Creation Date: 08/22/2007 4:37 PM  _______________________________________________________________________    (1) Order result status: Final  Collection or observation date-time: 08/21/2007 17:23  Requested date-time:   Receipt date-time:   Reported date-time: 08/22/2007 10:05  Referring Physician:    Ordering Physician:  Non-EMR Physician [facility])  Specimen Source:   Source: [name] Order Number: [id number] RADIOLOGY  Lab site:

## 2007-08-22 NOTE — Unmapped (Signed)
Signed by Tawanna Sat MA on 08/22/2007 at 15:52:00    Phone Note   Patient Call    Call back at Home Phone: [phone]  Caller: patient    Reason for call: needs to discuss orders, said she has to retake them and needs new ones      Initial call taken by: Kennedy Bucker,  August 22, 2007 3:18 PM      Follow-up for Phone Call   Had to leave a message.  need med and strength  Phone Call Completed, Called Patient  Follow-up by: Tawanna Sat MA,  August 22, 2007 3:51 PM

## 2007-08-22 NOTE — Unmapped (Signed)
Signed by Ronelle Nigh on 08/22/2007 at 09:01:01    Phone Note   Patient Call  Call back at Home Phone: [phone]  Caller: patient  Call for: BRITNEY    Summary of Call: PT WANTS TO TALK TO TALK TO DR [facility].   IT IS IS VERY IMPORTANT. SHE ONLY WANTS TO DISCUSS THIS WITH BRITNEY.     Initial call taken by: Ronelle Nigh,  August 22, 2007 9:00 AM    This department is not currently using the EMR system. Please see the paper chart for this patient for the response to this phone message.

## 2007-08-23 NOTE — Unmapped (Signed)
Signed by Georgiann Hahn Diebel MA on 08/23/2007 at 10:52:17    Phone Note   Patient Call  Call back at Home Phone: [phone]  Caller: patient  Department: IM - General  Call for: Jansen Goodpasture    Summary of Call: patient would like to call her to tell her if everything has been faxed to Dr.Flaherty office for her appt on 08/27/2007. you can reach her at home number [phone] Thank you   Current Allergies:   NKA      Initial call taken by: Myles Rosenthal MA,  August 23, 2007 10:51 AM

## 2007-08-23 NOTE — Unmapped (Signed)
Signed by Tawanna Sat MA on 08/23/2007 at 10:28:41    Phone Note   Patient Call    Call back at Home Phone: [phone]  Caller: patient  Call for: Abelina Bachelor    Follow-up for Phone Call   Patient returned your call.  States she has to have blood work done by the 25th of Nov.    Wants to have this done at Dr Aquilla Hacker office.  Need blood work order for this to be done.  Dr Aquilla Hacker phone is  [phone].  She did not have the fax number.  Follow-up by: Severiano Gilbert,  August 23, 2007 10:26 AM        Summary of Call:  Pt calling stating she spoke with Dr. Phoebe Sharps and one of her test didn't go through  and needs another order faxed over for it to be redone. Pt is scheduled 11.25.08 in the morning for an EEG.  Ph: [phone].     Initial call taken by: Marinell Blight,  August 23, 2007 8:42 AM      Follow-up for Phone Call   tried to call pt 2 times finally left message on VM.  gave office number with message I was refaxing orders to [phone]  Phone Call Completed, Prescription Completed, Called Patient, Information sent  Follow-up by: Tawanna Sat MA,  August 23, 2007 10:22 AM

## 2007-08-24 LAB — TSH: TSH: 0.29 u[IU]/mL — ABNORMAL LOW (ref 0.400–4.000)

## 2007-08-24 NOTE — Unmapped (Signed)
Signed by Collier Flowers MD on 08/24/2007 at 00:00:00  Lab Report      Imported By: Maryellen Pile 09/11/2007 10:11:06    _____________________________________________________________________    External Attachment:    Please see [facility] EMR for this document.

## 2007-08-24 NOTE — Unmapped (Signed)
Signed by   LinkLogic on 08/26/2007 at 11:40:34  Patient: Tracie Romero  Note: All result statuses are Final unless otherwise noted.    Tests: (1) TSH (2050)    Order Note: FASTING STATUS:  UNKNOWN    Order Note: Comments:   Anti-Cardiolipin Antibody IGG IGM  Confidential Patient Information: Accompanied are [facility] results that are being   delivered by [facility].  If you receive a clinical result for a patient that is not yours please   contact [facility] at [phone].    TSH                  [L]  0.290 uIU/mL                0.400-4.000    Note: An exclamation mark (!) indicates a result that was not dispersed into   the flowsheet.  Document Creation Date: 08/26/2007 11:40 AM  _______________________________________________________________________    (1) Order result status: Final  Collection or observation date-time: 08/24/2007 09:30  Requested date-time:   Receipt date-time: 08/24/2007 15:15  Reported date-time:   Referring Physician:    Ordering Physician: Helane Rima D.O. Juliane Poot)  Specimen Source:   Source: Weyman Croon Order Number: [id number]  Lab site:

## 2007-08-26 LAB — HIV 1+2 ANTIBODY/ANTIGEN WITH REFLEX: HIV-1/HIV-2 Ab: NONREACTIVE

## 2007-08-26 NOTE — Unmapped (Signed)
Signed by Collier Flowers MD on 08/27/2007 at 10:00:18    Clinical Lists Changes    Observations:  Added new observation of HIV AB: Non-Reactive (08/17/2007 11:12)

## 2007-08-26 NOTE — Unmapped (Signed)
Signed by Antonieta Loveless MD on 08/26/2007 at 00:00:00  Outside Medical Records      Imported By: Heath Gold 08/26/2007 13:44:44    _____________________________________________________________________    External Attachment:    Please see [facility] EMR for this document.

## 2007-08-27 NOTE — Unmapped (Signed)
Signed by Erasmo Score. Page Pucciarelli MD, PHD on 08/28/2007 at 10:16:32    EEG REPORT      Electroencephalography Report   Requesting Physician: Burnett Harry MD  Date of Service: 08/27/2007  EEG Number: [id number]    Patient History:   Patient History: Pt. is a 62yr.o rt.h f who c/o chronic daily h/a.  She c/o spells with right-sided numbness, sometimes slurred speech, and losing her train of thought.  She states that 1 mo. ago she had an episode of dizziness with LOC  and confusion.  Has hx. of CAD, hypertension, and depression.    Current Medications: Plavix, Tricor, Folic Acid, ASA, Atenolol, Isobride, Lisinopril, Xanax, Effexor.  Technical Summary:   20 channels of EEG are recorded in a digital format on a patient who is reported to be awake and drowsy during the recording.  Anterior temporal leads are used for this recording. While awake, the background rhythm consisted of  a well developed and well regulated 9-10 hertz alpha activity over the posterior head region which is reactive to eye opening and closure.  Photic Simulation: produced no abnormalities  Hyperventilation:  produced no abnormalities  During the recording, the patient became drowsy but did not fall asleep.   EKG:  No significant cardiac rhythm abnormalities are seen in the EKG monitoring channel.    Interpretation:   This EEG is within normal limits during the awake and drowsy states. No focal, lateralizing or epileptiform abnormalities were seen.  Clinical correlation is recommended.      Interpreting Physician: Erasmo Score. Kelso Bibby MD, PHD

## 2007-09-03 LAB — OFFICE VISIT LAB RESULTS
ALT: 19 U/L
ANA Pattern: POSITIVE
AST: 24 U/L
Albumin: 5 g/dL
Alkaline Phosphatase: 92 U/L
Bilirubin, Direct: 0.1 mg/dL
Bilirubin, Indirect: 0.1 mg/dL
Cholesterol, Total: 161 mg/dL
Free T4: 1.26 ng/dL
HDL: 43 mg/dL
LDL Cholesterol: 96 mg/dL
Sed Rate: 29 mm/h
T3, Total: 1.49 ng/dL
TSH: 0.31 u[IU]/mL
Total Bilirubin: 0.2 mg/dL
Total Protein: 7.9 g/dL
Triglycerides: 104 mg/dL
Vitamin B-12: 528 pg/mL

## 2007-09-03 NOTE — Unmapped (Signed)
Signed by Collier Flowers MD on 09/17/2007 at 08:58:37    Clinical Lists Changes    Observations:  Added new observation of ANA: Positive at 1-40; homogenous pattern; ANA Titer: 1-80 (08/24/2007 5:42)  Added new observation of B12: 528 pg/mL (08/17/2007 11:12)  Added new observation of ESR: 29 mm/hr (08/17/2007 11:12)  Added new observation of TRIGLYCERIDE: 104 mg/dL (57/84/6962 95:28)  Added new observation of HDL: 43 mg/dL (41/32/4401 02:72)  Added new observation of LDL: 96 mg/dL (53/66/4403 47:42)  Added new observation of CHOLESTEROL: 161 mg/dL (59/56/3875 64:33)  Added new observation of PROTEIN, TOT: 7.9 g/dL (29/51/8841 66:06)  Added new observation of ALK PHOS: 92 units/L (08/17/2007 11:12)  Added new observation of SGPT (ALT): 19 units/L (08/17/2007 11:12)  Added new observation of SGOT (AST): 24 units/L (08/17/2007 11:12)  Added new observation of BILI INDIREC: 0.1 mg/dL (30/16/0109 32:35)  Added new observation of BILI DIRECT: 0.10 mg/dL (57/32/2025 42:70)  Added new observation of BILI TOTAL: 0.20 mg/dL (62/37/6283 15:17)  Added new observation of ALBUMIN: 5.0 g/dL (61/60/7371 06:26)  Added new observation of T4, FREE: 1.26 ng/dL (94/85/4627 03:50)  Added new observation of T3, TOTAL: 1.49 ng/dL (09/38/1829 93:71)  Added new observation of TSH: 0.31 microintl units/mL (08/17/2007 11:12)

## 2007-09-19 NOTE — Unmapped (Signed)
Signed by   LinkLogic on 09/23/2007 at 13:06:53  Patient: Tracie Romero  Note: All result statuses are Final unless otherwise noted.    Tests: (1) CT ABD/PELVIS W/O CONTRAST* (28906)    Order Note:     Order Note: Non-EMR Ordering Provider: Merri Brunette,   MD    Order Note: Confidential Patient Information: Accompanied are [facility]   results that are being delivered by [facility].   If you receive a clinical result for a patient that is not yours please   contact [facility] at [phone].    Order Note:      CT SCAN OF THE ABDOMEN AND PELVIS WITHOUT CONTRAST 09/19/07     REASON FOR STUDY- Vomiting, right sided pain extending to the back,  kidney stone.     A comparison study is from 02/11/2007.     TECHNICAL FACTORS- Images were acquired from the lung bases through  the pelvic floor. No oral or intravenous contrast was administered.  Renal stone protocol was used.     FINDINGS- The lung bases are clear. The liver, spleen, pancreas and  gallbladder are normal. There is extensive calcified plaque in the  abdominal aorta. No aneurysm is seen. No findings of  nephrolithiasis. The ureters are normal in caliber. There is no  hydronephrosis. No ureteral calculi are seen. There is a 1.7 x 2.4  cm right ovarian cyst, and there is no free pelvic fluid. The  urinary bladder is normal. The appendix is not clear seen.     IMPRESSION-     1. EXAMINATION IS NEGATIVE FOR NEPHROLITHIASIS OR URETEROLITHIASIS.     2. 2.4 CM RIGHT OVARIAN CYST. NO OTHER DIAGNOSTIC ABNORMALITIES ARE  VISUALIZED.     Final impression agrees with preliminary report. The results were  communicated to the emergency department per protocol.     GM/sk  D- 09/20/07  T- 09/20/07          Read ByAnnie Main  M.D.       Released ByAnnie Main  M.D.       Released Date Time- 09/20/07 1838          Transcriptionist- SHERRY KNOTTS     ------------------------------------------------------------------------------      ! CT ABD/PELVIS W/O CONTRAST*                               Result Below...        RESULT: See Report for Impression  (R)    Note: An exclamation mark (!) indicates a result that was not dispersed into   the flowsheet.  Document Creation Date: 09/23/2007 1:06 PM  _______________________________________________________________________    (1) Order result status: Final  Collection or observation date-time: 09/19/2007 19:49  Requested date-time:   Receipt date-time:   Reported date-time: 09/20/2007 07:45  Referring Physician:    Ordering Physician:  Non-EMR Physician [facility])  Specimen Source:   Source: [name] Order Number: [id number] RADIOLOGY  Lab site:

## 2007-09-19 NOTE — Unmapped (Signed)
Signed by   LinkLogic on 09/19/2007 at 11:16:18    Appointment status changed to no show by  LinkLogic on 09/19/2007 11:16 AM.    No Show Comments  ----------------  (MOF) SEIZURE    Appointment Information  -----------------------  Appt Type:         Date:  Thursday, September 19, 2007       Time:  11:00 AM for 30 min    Urgency:  Routine    Made By:  Jory Sims MA   To Visit:  Tracie Beske APRN Tracie Romero     Reason:  (MOF) SEIZURE    Appt Comments  -------------  -- 09/19/07 11:16: Art Buff) NO SHOW --  (MOF) SEIZURE  RET    -- 06/20/07 13:48: (DODDSL) BOOKED --  Routine  at 09/19/2007 11:00 AM for 30 min  (MOF) SEIZURE  RET    -- 06/20/07 13:21: Art Buff) BOOKED --  Routine  at 09/19/2007 11:00 AM for 30 min  (MOF)

## 2007-09-19 NOTE — Unmapped (Signed)
Signed by   [employer]Log[employer] on 09/23/2007 at 13:04:47  Patient: Tracie Romero  Note: All result statuses are Final unless otherwise noted.    Tests: (1) EMERGENCY DEPARTMENT REPORT (ER)    Order Note: PATIENT NAME                        PA #                 MR #  Tracie, Romero                  [id number]           [id number]  EMERGENCY ROOM PHYSICIAN                          ADM DATE  Harriett Sine, MD                                09/19/2007  DATE OF BIRTH        AGE               PATIENT TYPE          RM #  28-Dec-1960           46                ERQ    ATTENDING:  Dr. _____.    HISTORY:  The patient is a 46 year old female with a 6-day history of right  flank pain, right lower quadrant pain, left lower quadrant pain.  Hurts all  over.  She hurts so bad she vomited up blood earlier today.  The pain has  been going on for 6 days moving all about.    PAST MEDICAL HISTORY:  She has a history of having stents in her heart,  high blood pressure.    MEDICATIONS:  She takes aspirin.    PHYSICAL EXAMINATION:  VITAL SIGNS:  Temperature 98.3, pulse 66, respiratory rate 18, blood  pressure 150/74.  HEENT:  _____ pharynx negative.  NECK:  Supple.  CHEST:  Clear.  HEART:  Regular rate and rhythm.  ABDOMEN:  Flat, soft, bilateral pelvic tenderness.  No guarding, rebound,  rigidity.  BACK:   No CVA tenderness.    EMERGENCY ROOM COURSE:  In the emergency room, we do a workup, which  includes a urine pregnancy test, which is negative, a urinalysis, which  shows no blood, no infection.  We had a CAT scan read by the radiologist,  Dr. Alfonse Ras, as no signs of appendicitis, no obstructive uropathy, no  nephrolithiasis but she does have bilateral ovarian cysts.    DIAGNOSIS:  Bilateral ovarian cyst.    DISCHARGE PLAN:  Percocet #20.  Follow up with her own OB/GYN doctor.                                                 Harriett Sine, MD    HB/sst  D:  09/20/2007  3:58 A     T:  09/20/2007  6:25 P     Job # [id number]  CS #  [id number]  cc:    Harriett Sine, MD         Harless Litten, DO  Confidential Patient Information: Accompanied are [facility] results that   are being delivered by [facility].  If you receive a clinical result for a patient that is not yours please   contact [facility] at [phone].    Order Note: **SIGNED LEGAL DOCUMENT IS LOCATED IN DOCVIEW**    Note: An exclamation mark (!) indicates a result that was not dispersed into   the flowsheet.  Document Creation Date: 09/23/2007 1:04 PM  _______________________________________________________________________    (1) Order result status: Final  Collection or observation date-time: 09/19/2007  Requested date-time:   Receipt date-time:   Reported date-time:   Referring Physician: Harriett Sine  Ordering Physician:  Reviewed In Hospital [facility])  Specimen Source:   Source: Francesco Sor Order Number: [id number] TRANSCRIPTION  Lab site:

## 2007-09-24 NOTE — Unmapped (Signed)
Signed by Gilford Rile Humphrey on 09/24/2007 at 15:27:17    Phone Note   Patient Call  Call back at Home Phone: [phone]  Caller: patient  Call for: DR. Phoebe Sharps    Summary of Call: PATIENT HAS A CYST ON BOTH ON OVARIES. PATIENT IS DOUBLED OVER WITH PAIN.  PATIENT WENT TO THE HOSPITAL AND THEY GAVE HER PERCOCET BUT PATIENT IS OUT OF MEDICATION.  PATIENT IS REQUESTING DR. Phoebe Sharps TO GIVE HER A SCRIPT FOR MORE PERCOCET.  PATIENT HAS AN APPOINTMENT WITH DR. Phoebe Sharps ON Caleen Essex 09/27/07. PATIENT CAN BE REACHED AT [phone].  THANK YOU       Initial call taken by: Willodean Rosenthal,  September 24, 2007 3:22 PM    This department is not currently using the EMR system. Please see the paper chart for this patient for the response to this phone message.

## 2007-09-25 NOTE — Unmapped (Signed)
Signed by Waynetta Pean MA on 09/25/2007 at 09:48:11    Prescriptions:  ISOSORBIDE MONONITRATE CR 60 MG TB24 (ISOSORBIDE MONONITRATE) 1 qd  #30 x 3   Entered by: Waynetta Pean MA   Authorized by: Shayla Toombs-Withers DO   Signed by: Waynetta Pean MA on 09/25/2007   Method used: Telephoned to ...     [facility]     [address]     [place], [place]  [address]     Ph: [phone]     Fax: [phone]   RxID: [id number]

## 2007-10-07 NOTE — Unmapped (Signed)
Signed by Henreitta Cea on 10/07/2007 at 09:42:02    Phone Note   Other Incoming Call    Call From: [facility]  Caller's Name: Evlyn Kanner  Caller Phone #: [phone]  Call For: DR Phoebe Sharps  Reason for Call: need patient information  Summary of call: BARB REQUESTING THE LATEST OFFICE NOTES BE FAXED TO:  [phone]. ATT: BARB EVANS    This department is not currently using the EMR system. Please see the paper chart for this patient for the response to this phone message.  Initial call taken by: Henreitta Cea,  October 07, 2007 9:42 AM

## 2007-10-08 NOTE — Unmapped (Signed)
Signed by Antonieta Loveless MD on 10/08/2007 at 00:00:00  [facility]      Imported By: Betsey Amen 10/10/2007 13:52:59    _____________________________________________________________________    External Attachment:    Please see [facility] EMR for this document.

## 2007-10-11 NOTE — Unmapped (Signed)
Signed by   [employer] on 10/16/2007 at 12:04:01  Patient: Tracie Romero  Note: All result statuses are Final unless otherwise noted.    Tests: (1) Mammogram Screening [id number] ([facility])    Order Note:     Order Note:     SCREENING MAMMOGRAM WITH CAD    COMPARISON:   02-16-05.    TISSUE DENSITY:  There are scattered fibroglandular densities that could   obscure a lesion on mammography.    No significant new findings since 02-16-05.    CAD detected no significant findings.    IMPRESSION:  Negative.    RECOMMENDATION:  Annual screening mammogram.    ACR 1 - Negative              Order Note: Non-EMR Ordering Provider: Beatriz Stallion CNP     Order Note: Site: [facility] RADIOLOGY  [phone]  Rad #: [id number]  Unit #: [id number]  Location: [facility]  Account #: [id number]  Req #: [id number]  Order #: [id number]  Primary Insurance: [employer] PREFERRED  Procedure: Mammogram Screening [id number]\\.brxam Date/Time: 10/11/07 0001  Admitting Diagnosis: V76.12 SCREEN MAMMO  Reason for exam: ROUTINE            Order Note: Dictated by:  Lanora Manis M.D. Magdalene Patricia byLanora Manis M.D. Alexander    10/16/07 1146      RH  D:  10/14/07 1239  T:  10/15/07 0731    This document is confidential medical information.  Unauthorized disclosure or   use of this information is prohibited by law.  If you are not the intended   recipient of this document, please advise [place] by calling immediately   [phone].   Mammogram Screening [id number]                              Negative (R)    Note: An exclamation mark (!) indicates a result that was not dispersed into   the flowsheet.  Document Creation Date: 10/16/2007 12:04 PM  _______________________________________________________________________    (1) Order result status: Final  Collection or observation date-time: 10/11/2007 00:01  Requested date-time: 10/16/2007 11:46  Receipt date-time: 10/11/2007 00:01  Reported date-time:   Referring Physician:    Ordering Physician:  Non-EMR Physician  [facility])  Specimen Source:   Source: [name] Order Number: [id number] RADIOLOGY  Lab site: [facility]

## 2007-10-18 NOTE — Unmapped (Signed)
Signed by Everitt Amber RN on 10/18/2007 at 11:00:46    Phone Note   Patient Call  Call back at Home Phone: [phone]  Caller: patient  Department: IM - Cardiology  Call for: lovenox bridging    Reason for Call: medication issues  Reason for call: pending surgery  Summary of Call: pt called concerned over taking plavix and asa as blood thinners and needing to bridge with lovenox if and when she was having surgery.  Explained at length with pt bridging is done for coumadin.  Instructed pt to give surgeon list of medications and to discuss plavix and asa with surgeon.  Instructed pt to return call if more information was needed or cardiac clearance was necessary.  Pt verbalized good understanding of instructions.      Initial call taken by: Everitt Amber RN,  October 18, 2007 10:59 AM

## 2007-11-12 NOTE — Unmapped (Signed)
Signed by Everitt Amber RN on 11/12/2007 at 16:09:52    ---- Converted from flag ----  ---- 11/12/2007 3:10 PM, Geni Bers wrote:  PT WOULD LIKE TO SPEAK W/ NURSE, WAS ADVISED BY VASCULAR DOCTOR TO CALL DR Rizwan Kuyper DUE TO PAIN IN HER ARM. PT CONTACT [phone]  ------------------------------  returned call to pt.  Appt made with Dr. Richardine Service.  Instructed pt that is symptoms became worse.  Go to ER.  Pt verbalized good understanding of instructions.

## 2007-11-13 NOTE — Unmapped (Signed)
Signed by Allena Katz on 11/13/2007 at 06:58:43      Preload Clinical Lists   Problems added:   OTHER DISEASES OF LUNG NOT ELSEWHERE CLASSIFIED (ICD-518.89)  EMPHYSEMA (ICD-492.8)  PRINZMETAL'S ANGINA (ICD-413.1)  STATUS, PTCA (ICD-V45.82)  TOBACCO ABUSE (ICD-305.1)  STROKE (ICD-434.91)  TIA (ICD-435.9)  CLAUDICATION, INTERMITTENT (ICD-443.9)  HYPERLIPIDEMIA (ICD-272.4)  CAROTID ARTERY DISEASE (ICD-433.10)  HYPERTENSION (ICD-401.9)  DEPRESSION (ICD-311)  CAD (ICD-414.00)  HEADACHE (ICD-784.0)  SPELLS (ICD-780.39)    Medications added:   PLAVIX 75 MG TABS (CLOPIDOGREL BISULFATE) 1 qd  ASPIRIN 81 MG TBEC (ASPIRIN) 1 qd  EFFEXOR XR 75 MG CP24 (VENLAFAXINE HCL) 1 qd  PRINIVIL 10 MG TABS (LISINOPRIL) 1 qd  FOLIC ACID 1 MG TABS (FOLIC ACID) 1 qd  ISOSORBIDE MONONITRATE CR 60 MG TB24 (ISOSORBIDE MONONITRATE) 1 qd  XANAX 1 MG TABS (ALPRAZOLAM) 1 by mouth twice a day as needed  LIPITOR 80 MG TABS (ATORVASTATIN CALCIUM) daily  TRICOR 145 MG TABS (FENOFIBRATE) qd  NITROGLYCERIN SUBL (NITROGLYCERIN SUBL) 0.4mg  as needed  ATENOLOL 25 MG TABS (ATENOLOL) one by mouth daily  TOPAMAX 25 MG  TABS (TOPIRAMATE) One by mouth daily.  Increase as directed.  ZETIA 10 MG TABS (EZETIMIBE) one by mouth daily  DILTIAZEM HCL CR 120 MG  CP12 (DILTIAZEM HCL) daily      Past History  Past Medical History:  Coronary Artery Disease, Hyperlipidemia, Hypertension, Depression, Stroke, Transcient Ischemic Attack (TIA), hemorrhoids  anxiety  Carotid bruits  Marijuana use  Surgical History:  Coronary Stent: x7, 1 stent in left arm, Arthroscopic Knee Surgery: Right, TIA  Angioplasty + stent 1/03  Right hand mass excision 5/08    Family History: HTN, hyperlipidemia, heart disease, diabetes, cancer  GF- Lung Ca  Sister - Epilepsy  Brother - MI  Social History: Marital Status: married,   Children: 2,   Employment Status: disabled,   Patient Lives at: home,   Support System: excellent  Caffeine per Day: 4+  Seatbelt Use: 100 % of the time  Alcohol Use:  light  Drug Use: marijuana  Tobacco Usage:smoker  Cigarettes-Packs per Day- currently <.5 ppd,       Preventive Maintenance       Coordinating Care Providers   PCP Name: Ian Bushman, MD  Cardiologist: Dr. Matthias Hughs  Vascular: Dr. Colbert Ewing

## 2007-11-15 NOTE — Unmapped (Signed)
Signed by Bunnie Pion RMA on 11/15/2007 at 12:05:46    [facility] - PRELOAD      Preload Clinical Lists   Problems added:   OTHER DISEASES OF LUNG NOT ELSEWHERE CLASSIFIED (ICD-518.89)  EMPHYSEMA (ICD-492.8)  PRINZMETAL'S ANGINA (ICD-413.1)  STATUS, PTCA (ICD-V45.82)  TOBACCO ABUSE (ICD-305.1)  STROKE (ICD-434.91)  TIA (ICD-435.9)  CLAUDICATION, INTERMITTENT (ICD-443.9)  HYPERLIPIDEMIA (ICD-272.4)  CAROTID ARTERY DISEASE (ICD-433.10)  HYPERTENSION (ICD-401.9)  DEPRESSION (ICD-311)  CAD (ICD-414.00)  HEADACHE (ICD-784.0)  SPELLS (ICD-780.39)    Medications added:   PLAVIX 75 MG TABS (CLOPIDOGREL BISULFATE) 1 qd  ASPIRIN 81 MG TBEC (ASPIRIN) 1 qd  EFFEXOR XR 75 MG CP24 (VENLAFAXINE HCL) 1 qd  PRINIVIL 10 MG TABS (LISINOPRIL) 1 qd  FOLIC ACID 1 MG TABS (FOLIC ACID) 1 qd  ISOSORBIDE MONONITRATE CR 60 MG TB24 (ISOSORBIDE MONONITRATE) 1 qd  XANAX 1 MG TABS (ALPRAZOLAM) 1 by mouth twice a day as needed  LIPITOR 80 MG TABS (ATORVASTATIN CALCIUM) daily  TRICOR 145 MG TABS (FENOFIBRATE) qd  NITROGLYCERIN SUBL (NITROGLYCERIN SUBL) 0.4mg  as needed  ATENOLOL 25 MG TABS (ATENOLOL) one by mouth daily  TOPAMAX 25 MG  TABS (TOPIRAMATE) One by mouth daily.  Increase as directed.  ZETIA 10 MG TABS (EZETIMIBE) one by mouth daily  DILTIAZEM HCL CR 120 MG  CP12 (DILTIAZEM HCL) daily      Past History  Past Medical History (reviewed - no changes required):  Coronary Artery Disease, Hyperlipidemia, Hypertension, Depression, Stroke, Transcient Ischemic Attack (TIA), hemorrhoids  anxiety  Carotid bruits  Marijuana use  Surgical History (reviewed - no changes required):  Coronary Stent: x7, 1 stent in left arm, Arthroscopic Knee Surgery: Right, TIA  Angioplasty + stent 1/03  Right hand mass excision 5/08    Family History (reviewed - no changes required): HTN, hyperlipidemia, heart disease, diabetes, cancer  GF- Lung Ca  Sister - Epilepsy  Brother - MI  Social History (reviewed - no changes required): Marital Status: married,    Children: 2,   Employment Status: disabled,   Patient Lives at: home,   Support System: excellent  Caffeine per Day: 4+  Seatbelt Use: 100 % of the time  Alcohol Use: light  Drug Use: marijuana  Tobacco Usage:smoker  Cigarettes-Packs per Day- currently <.5 ppd,     Previous Echocardiogram:  12/25/06-ECHO   SUMMARY   -  Overall left ventricular systolic function was normal. Left         ventricular ejection fraction was estimated to be 60 % in the         range of 55 % to 60 %. There were no left ventricular         regional wall motion abnormalities. There was an increased         relative contribution of atrial contraction to left         ventricular filling, consistent with diastolic dysfunction.      Previous Heart Catheterization/PCI:  06/28/07-  1. Left heart catheterization.   2. Selective cholangiography.   3. Left ventriculogram.   4. Selective right femoral arteriography.   5. Ascending aorta arteriography.    FINAL DIAGNOSIS:     1. Diffuse non-occlusive coronary artery disease.  Patent stent and obtuse     marginal branch and diagonal branch and mid RCA with TIMI-3 flow.    04.18.08:  POSTPROCEDURE DIAGNOSES:     1. Moderate mid left anterior descending stenosis of 50-60% with known  critical fractional flow reserve 0.83.   2. Widely patent left circumflex and first diagonal stents.   3. Preserved left ventricular function with ejection fraction of 60-65% with     normal wall motion and normal filling pressures   4. Normal central aortic pressure 120/70.   5. Moderate left subclavian stenosis approximately 60% with 20 mm     translesional gradient.   6. Diffuse moderate aortoiliac disease without obvious renal artery stenosis.  CONCLUSIONS:     1. Moderate mid left anterior descending stenosis of 50-60% with known     critical fractional flow reserve 0.83.   2. Widely patent left circumflex and first diagonal stents.   3. Preserved left ventricular function with ejection fraction of 60-65% with      normal wall motion and normal filling pressures   4. Normal central aortic pressure 120/70.   5. Moderate left subclavian stenosis approximately 60% with 20 mm     translesional gradient.   6. Diffuse moderate aortoiliac disease without obvious renal artery stenosis.    04.16.08  PROCEDURES PERFORMED:     1.  Aortogram with runoff.   2.  Selective renal angiography.   3.  Selective iliac artery injections.     IMPRESSION:     1.  The mild aortoiliac atherosclerosis with 30% distal aortic obstruction.   2.  A 30% obstruction of the left common iliac.   3.  A 70% obstruction at the ostium of the left renal artery.    04.09.07:  POSTOPERATIVE DIAGNOSIS(ES):     1.  Coronary arteries are free of significant fixed disease.   2.  Diffuse left circumflex and LAD spasm without chest pain relieved with        nitroglycerin.   3.  Normal left ventricular function by angiography and by end diastolic   pressure.    10.30.06:     POSTOPERATIVE DIAGNOSIS(ES):     1.  Two-vessel coronary artery disease, manifested as moderate disease of the   LAD and severe in-stent restenosis of the circumflex.   2.  Normal left ventricular systolic function.   3.  No mitral regurgitation.   4.  Successful PTCA and stent of the circumflex OM in-stent restenosis with 2   separate TAXUS drug-eluting stents.    01.09.06:  ANGIOGRAPHIC RESULTS:    1.        The circumflex is nondominant, distributing as a large OM branch and            modest posterolateral branch.  There was a mid stent distal to the            takeoff of the PL branch, which had a focal mid in-stent 60%            obstruction that was reduced to 0% with angioplasty and stenting.            There was noted to be spasm intraprocedurally completed with            nitroglycerin.      IMPRESSION:    1.        Successful revascularization to the mid circumflex artery at a site of            previous in-stent restenosis.      Previous Nuclear Study:  07/03/07-Stress Test  FINAL  INTERPRETATION There is normal myocardial perfusion.  Overall study quality is excellent.   Scan significance indicates low cardiac risk.    PERFUSION FINDINGS  There is normal left ventricular perfusion.   There is no evidence of visual transient dilation with a normal stress/rest left ventricular volume ratio of 1.10.    The right ventricle is normal.  EKG DATA: Rest EKG: Sinus rhythm/Normal conduction/No arrhythmias/Normal repolarizationStress EKG: Sinus rhythm/No arrhythmias/Normal pattern/Nondiagnostic low heart rate EKG findings (only): Nondiagnostic EKG due to inadequate heart rate    12/25/06-Stress Test  IMPRESSION:    NO EVIDENCE OF STRESS-INDUCED REVERSIBLE MYOCARDIAL ISCHEMIA.      Previous EKG/Holter/Event Monitor:  07/03/07-EKG  Ventricular Rate  67 BPM   Atrial Rate  67 BPM   P-R Interval  98 ms   QRS Duration  94 ms   QT  386 ms   QTc  407 ms   P Axis  11 degrees   R Axis  11 degrees   T Axis  1 degrees    Previous US/Vascular:  12/25/06-Venous Duplex  MPRESSION:    1.  ESTIMATED DIAMETER REDUCTION OF THE RIGHT INTERNAL CAROTID ARTERY IS 20-49%.    2.  ESTIMATED DIAMETER REDUCTION OF THE LEFT INTERNAL CAROTID ARTERY IS AT THE LOWER LIMITS OF 50-69%.    3.  BOTH VERTEBRAL ARTERIES ARE PATENT WITH ANTEGRADE FLOW.    THIS REPRESENTS NO SIGNIFICANT CHANGE COMPARED TO THE PREVIOUS STUDY DATED 05/18/06.    11.16.08  CAROTID DUPLEX STUDY     FINDINGS:  Brachial pressure is 122 mmHg on the right and 119 mmHg on the   left.     B-mode ultrasound reveals a large amount of nonechogenic plaque in both   carotid bulbs and proximal internal carotid arteries.  Doppler flow analysis   demonstrates a peak systolic velocity of 103 cm/second with an end-diastolic   velocity of 46 cm/second and severe spectral broadening in the right internal   carotid artery.  In the left internal carotid artery the peak systolic   velocity is 175 cm/second with an end-diastolic velocity of 86 cm/second and   severe spectral broadening.  There is antegrade flow in both vertebral   arteries.     IMPRESSION:     1. Moderate right internal carotid artery stenosis in the 16-49% range.   2. Severe left internal carotid artery stenosis in the 50-79% range.        Previous Radiology:  06/28/07-Chest Xray  Impression:    No evidence of acute cardiopulmonary disease    Coronary Risk Factor Assessment   Cigarette Smoker: smoker   HBP: no   Hyperlipidemia: yes     Labs:   Previous Cholesterol: 161 (08/17/2007 11:12:00 AM)  Previous LDL: 96 (08/17/2007 11:12:00 AM)  Previous HDL: 43 (08/17/2007 11:12:00 AM)  Previous Triglyceride: 104 (08/17/2007 11:12:00 AM)    Goals:     LDL Goal: <70  Diabetes: no   Family History of CAD: yes    Peripheral Vascular Disease: yes     Primary Care Physician:   Primary Care Provider: Ian Bushman, MD    Other Coordinating Care Providers   Consult Requested By: Helane Rima      Cardiology HPI:   Tracie Romero is a 47 Years Old Female who was seen at the request of [employer].  14 y white female, with history of CAD, multiple PCIs/ Stents, Peripheral arterial disease, Carotid disease, Subclavian disease, hx of CVA, and continued Tobbaco abuse/ Nicotine addiction.    She is following up after her recent Hospital admission at [facility] for symptoms of chest pain. She was diagnosed with possible Coronary  spasm. She has Intermediate stenosis in Mid LAD but rather diffuse disease. Her FFR accross that lesion in March at [facility] was 0.83. Her meds were changed from atenolol to ccb/ Norvasc. Overall, she is doing fairly well. Denies any active angina. She is still smoking but wanting to quit.  She saw Dr Renato Gails, for Carotid disease and PAD. Her rest and exercise ABI were mildly abnl for right sided disease. She was advised to quit smoking, modify her risk factors.      She will f/u with Neurology at the end of this month.       Cardiology Problem List:   PRINZMETAL'S ANGINA (ICD-413.1)  STATUS, PTCA  (ICD-V45.82)  TOBACCO ABUSE (ICD-305.1)  STROKE (ICD-434.91)  TIA (ICD-435.9)  CLAUDICATION, INTERMITTENT (ICD-443.9)  HYPERLIPIDEMIA (ICD-272.4)  CAROTID ARTERY DISEASE (ICD-433.10)  HYPERTENSION (ICD-401.9)  DEPRESSION (ICD-311)  CAD (ICD-414.00)  HEADACHE (ICD-784.0)  SPELLS (ICD-780.39)      Cardiology Assessment:   1. CAD (ICD-414.00) with hx of STATUS PTCA (ICD-V45.82) and recent admission with possible Coronary spasm/ PRINZMETAL'S ANGINA (ICD-413.1)  2. TOBACCO ABUSE (ICD-305.1)   3. CAROTID ARTERY DISEASE (ICD-433.10) and STROKE (ICD-434.91)  4. CLAUDICATION INTERMITTENT (ICD-443.9)   5.HYPERLIPIDEMIA (ICD-272.4)       Cardiology Plan:   1. CAD (ICD-414.00) with hx of STATUS PTCA (ICD-V45.82) and recent admission with possible Coronary spasm/ PRINZMETAL'S ANGINA (ICD-413.1)---- Continue asa, nitrates, ccb, statins. Quit smoking. Pt will stop atenolol and switch to ccb/ Norvasc.  2. TOBACCO ABUSE (ICD-305.1) ---------- Most of her problems are related to smoking/ tobbaco abuse. I counselled her to quit smoking and gave her materials about the programs at [facility] to help her quit it.  3. CAROTID ARTERY DISEASE (ICD-433.10) and STROKE (ICD-434.91) --- Willl see neurology at the end of the month. Also follows Vascular Clinic.  4. CLAUDICATION INTERMITTENT (ICD-443.9) ------- sees Vascular  5.HYPERLIPIDEMIA (ICD-272.4) --------- currently she is taking Lipitor 80, Tricor, and Zetia.  Triple therapy puts her at high risk of myopathy or liver problems. I told her to discuss with PC and possibly stop Zetia.  Repeat Lipids, LFTs, CPK in 4-6 weeks after that.   All non-cardiac problems referred to primary care.            ]

## 2007-11-18 NOTE — Unmapped (Signed)
Signed by Hillery Hunter on 11/18/2007 at 10:15:50    Phone Note   Patient Call  Call back at Home Phone: [phone]  Caller: patient  Call for: [facility]    Pharmacy Information: Rushie Chestnut [phone]   Summary of Call: PT SAID SHE WAS PULLED OVER ON SATURDAY FOR WEAVING. SHE HAD PRESCRIPTION DRUGS IN HER CAR. PT HAS TO APPEAR IN COURT TOMORROW. THEY NEED A LIST OF HER MEDS AND AN EXPLANATION OF WHY THE PT NEEDS THE MEDS ALONG WITH THE QUANITIES. PT NEEDS THIS BEFORE HER COURT APPERANCE TOMORROW. PT LOST HER DRIVING PRIVELAGES UNTIL THE JUDGE DECIDES HER SENTANCE SO SHE IS NOT ABLE TO MAKE IT TO THE OFFICE TODAY. SHE WOULD LIKE TO KNOW IF THIS INFO CAN BE FAXED TO HER DAUGHTERS WORK. FAX #[phone] ATTN: LAURA Tristan. (HER DAUGHTER ONLY WORKS TILL 3:30PM TODAY.)  PT IS ALSO REQUESTING REFILLS ON ALL HER MEDICATION. I ASKED PT TO LIST HER MEDS FOR ME AND SHE WAS UNABLE TO FIND HER MED CARD AND SHE DID NOT HAVE THE BOTTLES. I WAS ASKING PT OFF HER MED LIST WHAT SHE NEEDED REFILLS ON.   -XANAX 1 MG 1 TAB 2X A DAY AS NEEDED  -PLAVIX 75MG  1X A DAY  -LIPITOR 80MG  1X A DAY  -TRICOR 145MG  1X A DAY  -FOLIC ACID 1MG  1X A DAY  -ATENOLOL 25MG  1X A DAY  -NITROGLYCERIN SUBL 0.4MG  AS NEEDED  -ISOSORBIDE MONITRATE CR 60MG  TB24  -OXYCODONE (NOT ON MED LIST)  -ASPRIN 81MG  1X A DAY  ***PLS CALL PT WITH ANY QUESTIONS/PROBLEMS [phone]. THANKS     Initial call taken by: Elenora Gamma,  November 18, 2007 10:13 AM    This department is not currently using the EMR system. Please see the paper chart for this patient for the response to this phone message.

## 2007-12-03 NOTE — Unmapped (Signed)
Signed by Everitt Amber RN on 12/03/2007 at 09:35:25    Printed Handout:  - Smoking: Ways to Quit

## 2007-12-03 NOTE — Unmapped (Signed)
Signed by Bunnie Pion RMA on 12/03/2007 at 09:36:20      Work Excuse   This patient was seen in the office 12/03/2007.  Comments: It is necessary for Mr. Blackley to drive Mrs. Deford to her appointments. Please contact us if you have additional questions at [phone] or [phone].

## 2007-12-03 NOTE — Unmapped (Signed)
Signed by Collier Flowers MD on 12/03/2007 at 11:15:43    [facility]  CARDIOLOGY FOLLOW-UP VISIT    Chief Complaint: f/u CAD, Chest pains    History of Present Illness   Tracie Romero is a 47 Years Old Female who was seen at the request of [employer].  Tracie Romero is a 47 Years Old Female who was seen at the request of Helane Rima.      3 y white female, with history of CAD, multiple PCIs/ Stents, Peripheral arterial disease, Carotid disease, Subclavian disease, hx of CVA, and continued [employer] abuse/ Nicotine addiction.    She has stable symptoms of chest pain/ chronic stable angina. She is taking good medical therapy and antianginals. She has Intermediate stenosis in Mid LAD but rather diffuse disease. Her FFR accross that lesion in March at [facility] was 0.83.    Unfortunately she is still smoking. She want s to quit and will try Chantix    She sees Dr Renato Gails, for Carotid disease and PAD. Her rest and exercise ABI were mildly abnl for right sided disease. She also has mild to moderate b/l carotid disease and previous stent in left subclavian. She was advised to quit smoking, modify her risk factors.  Lately she has symptoms of left arm numbness, and mild swelling. Her BP in left arm is 120, in right arm is 130 systolic.    She also f/u with Neurology for symptoms of headaches, and carotid disease.     Cardiology Problem List:   PRINZMETAL'S ANGINA (ICD-413.1)  STATUS, PTCA (ICD-V45.82)  TOBACCO ABUSE (ICD-305.1)  STROKE (ICD-434.91)  TIA (ICD-435.9)  CLAUDICATION, INTERMITTENT (ICD-443.9)  HYPERLIPIDEMIA (ICD-272.4)  CAROTID ARTERY DISEASE (ICD-433.10)  HYPERTENSION (ICD-401.9)  DEPRESSION (ICD-311)  CAD (ICD-414.00)  HEADACHE (ICD-784.0)  SPELLS (ICD-780.39)         Previous Echocardiogram:  12/25/06-ECHO   SUMMARY   -  Overall left ventricular systolic function was normal. Left         ventricular ejection fraction was estimated to be 60 % in the         range of 55 % to 60 %.  There were no left ventricular         regional wall motion abnormalities. There was an increased         relative contribution of atrial contraction to left         ventricular filling, consistent with diastolic dysfunction.      Previous Heart Catheterization/PCI:  06/28/07-  1. Left heart catheterization.   2. Selective cholangiography.   3. Left ventriculogram.   4. Selective right femoral arteriography.   5. Ascending aorta arteriography.    FINAL DIAGNOSIS:     1. Diffuse non-occlusive coronary artery disease.  Patent stent and obtuse     marginal branch and diagonal branch and mid RCA with TIMI-3 flow.    04.18.08:  POSTPROCEDURE DIAGNOSES:     1. Moderate mid left anterior descending stenosis of 50-60% with known     critical fractional flow reserve 0.83.   2. Widely patent left circumflex and first diagonal stents.   3. Preserved left ventricular function with ejection fraction of 60-65% with     normal wall motion and normal filling pressures   4. Normal central aortic pressure 120/70.   5. Moderate left subclavian stenosis approximately 60% with 20 mm     translesional gradient.   6. Diffuse moderate aortoiliac disease without obvious renal artery stenosis.  CONCLUSIONS:     1. Moderate mid left anterior descending stenosis of 50-60% with known     critical fractional flow reserve 0.83.   2. Widely patent left circumflex and first diagonal stents.   3. Preserved left ventricular function with ejection fraction of 60-65% with     normal wall motion and normal filling pressures   4. Normal central aortic pressure 120/70.   5. Moderate left subclavian stenosis approximately 60% with 20 mm     translesional gradient.   6. Diffuse moderate aortoiliac disease without obvious renal artery stenosis.    04.16.08  PROCEDURES PERFORMED:     1.  Aortogram with runoff.   2.  Selective renal angiography.   3.  Selective iliac artery injections.     IMPRESSION:     1.  The mild aortoiliac atherosclerosis with 30% distal  aortic obstruction.   2.  A 30% obstruction of the left common iliac.   3.  A 70% obstruction at the ostium of the left renal artery.    04.09.07:  POSTOPERATIVE DIAGNOSIS(ES):     1.  Coronary arteries are free of significant fixed disease.   2.  Diffuse left circumflex and LAD spasm without chest pain relieved with        nitroglycerin.   3.  Normal left ventricular function by angiography and by end diastolic   pressure.    10.30.06:     POSTOPERATIVE DIAGNOSIS(ES):     1.  Two-vessel coronary artery disease, manifested as moderate disease of the   LAD and severe in-stent restenosis of the circumflex.   2.  Normal left ventricular systolic function.   3.  No mitral regurgitation.   4.  Successful PTCA and stent of the circumflex OM in-stent restenosis with 2   separate TAXUS drug-eluting stents.    01.09.06:  ANGIOGRAPHIC RESULTS:    1.        The circumflex is nondominant, distributing as a large OM branch and            modest posterolateral branch.  There was a mid stent distal to the            takeoff of the PL branch, which had a focal mid in-stent 60%            obstruction that was reduced to 0% with angioplasty and stenting.            There was noted to be spasm intraprocedurally completed with            nitroglycerin.      IMPRESSION:    1.        Successful revascularization to the mid circumflex artery at a site of            previous in-stent restenosis.      Previous Nuclear Study:  07/03/07-Stress Test  FINAL INTERPRETATION There is normal myocardial perfusion.  Overall study quality is excellent.   Scan significance indicates low cardiac risk.    PERFUSION FINDINGS There is normal left ventricular perfusion.   There is no evidence of visual transient dilation with a normal stress/rest left ventricular volume ratio of 1.10.    The right ventricle is normal.  EKG DATA: Rest EKG: Sinus rhythm/Normal conduction/No arrhythmias/Normal repolarizationStress EKG: Sinus rhythm/No arrhythmias/Normal  pattern/Nondiagnostic low heart rate EKG findings (only): Nondiagnostic EKG due to inadequate heart rate    12/25/06-Stress Test  IMPRESSION:    NO EVIDENCE OF STRESS-INDUCED REVERSIBLE MYOCARDIAL ISCHEMIA.  Previous EKG/Holter/Event Monitor:  07/03/07-EKG  Ventricular Rate  67 BPM   Atrial Rate  67 BPM   P-R Interval  98 ms   QRS Duration  94 ms   QT  386 ms   QTc  407 ms   P Axis  11 degrees   R Axis  11 degrees   T Axis  1 degrees    Previous US/Vascular:  12/25/06-Venous Duplex  MPRESSION:    1.  ESTIMATED DIAMETER REDUCTION OF THE RIGHT INTERNAL CAROTID ARTERY IS 20-49%.    2.  ESTIMATED DIAMETER REDUCTION OF THE LEFT INTERNAL CAROTID ARTERY IS AT THE LOWER LIMITS OF 50-69%.    3.  BOTH VERTEBRAL ARTERIES ARE PATENT WITH ANTEGRADE FLOW.    THIS REPRESENTS NO SIGNIFICANT CHANGE COMPARED TO THE PREVIOUS STUDY DATED 05/18/06.    11.16.08  CAROTID DUPLEX STUDY     FINDINGS:  Brachial pressure is 122 mmHg on the right and 119 mmHg on the   left.     B-mode ultrasound reveals a large amount of nonechogenic plaque in both   carotid bulbs and proximal internal carotid arteries.  Doppler flow analysis   demonstrates a peak systolic velocity of 103 cm/second with an end-diastolic   velocity of 46 cm/second and severe spectral broadening in the right internal   carotid artery.  In the left internal carotid artery the peak systolic   velocity is 175 cm/second with an end-diastolic velocity of 86 cm/second and   severe spectral broadening. There is antegrade flow in both vertebral   arteries.     IMPRESSION:     1. Moderate right internal carotid artery stenosis in the 16-49% range.   2. Severe left internal carotid artery stenosis in the 50-79% range.        Previous Radiology:  06/28/07-Chest Xray  Impression:    No evidence of acute cardiopulmonary disease    Past History  Past Medical History (reviewed - no changes required):  Coronary Artery Disease, Hyperlipidemia, Hypertension, Depression, Stroke, Transcient  Ischemic Attack (TIA), hemorrhoids  anxiety  Carotid bruits  Marijuana use  Surgical History (reviewed - no changes required):  Coronary Stent: x7, 1 stent in left arm, Arthroscopic Knee Surgery: Right, TIA  Angioplasty + stent 1/03  Right hand mass excision 5/08    Family History (reviewed - no changes required): HTN, hyperlipidemia, heart disease, diabetes, cancer  GF- Lung Ca  Sister - Epilepsy  Brother - MI  Social History (reviewed - no changes required): Marital Status: married,   Children: 2,   Employment Status: disabled,   Patient Lives at: home,   Support System: excellent  Caffeine per Day: 4+  Seatbelt Use: 100 % of the time  Alcohol Use: light  Drug Use: marijuana  Tobacco Usage:smoker  Cigarettes-Packs per Day- currently <.5 ppd,     Coronary Risk Factor Assessment   Cigarette Smoker: smoker   HBP: no   Hyperlipidemia: yes     Labs:   Previous Cholesterol: 161 (08/17/2007 11:12:00 AM)  Previous LDL: 96 (08/17/2007 11:12:00 AM)  Previous HDL: 43 (08/17/2007 11:12:00 AM)  Previous Triglyceride: 104 (08/17/2007 11:12:00 AM)    Goals:     LDL Goal: <70  Diabetes: no   Family History of CAD: yes    Peripheral Vascular Disease: yes     Labs Reviewed    Review of Systems  See scanned document dated December 03, 2007.  The Review of Systems has been reviewed and the patient advised to  follow up with PCP for non-cardiac problems.      Intake   Intake Comments: Follow up     Problems  OTHER DISEASES OF LUNG NOT ELSEWHERE CLASSIFIED (ICD-518.89)  EMPHYSEMA (ICD-492.8)  PRINZMETAL'S ANGINA (ICD-413.1)  STATUS, PTCA (ICD-V45.82)  TOBACCO ABUSE (ICD-305.1)  STROKE (ICD-434.91)  TIA (ICD-435.9)  CLAUDICATION, INTERMITTENT (ICD-443.9)  HYPERLIPIDEMIA (ICD-272.4)  CAROTID ARTERY DISEASE (ICD-433.10)  HYPERTENSION (ICD-401.9)  DEPRESSION (ICD-311)  CAD (ICD-414.00)  HEADACHE (ICD-784.0)  SPELLS (ICD-780.39)    Medications   PLAVIX 75 MG TABS (CLOPIDOGREL BISULFATE) 1 qd  ASPIRIN 81 MG TBEC (ASPIRIN) 1 qd  PRINIVIL 10 MG  TABS (LISINOPRIL) 1 qd  FOLIC ACID 1 MG TABS (FOLIC ACID) 1 qd  ISOSORBIDE MONONITRATE CR 60 MG TB24 (ISOSORBIDE MONONITRATE) 1 qd  XANAX 1 MG TABS (ALPRAZOLAM) 1 by mouth twice a day as needed  LIPITOR 80 MG TABS (ATORVASTATIN CALCIUM) daily  TRICOR 145 MG TABS (FENOFIBRATE) qd  NITROGLYCERIN SUBL (NITROGLYCERIN SUBL) 0.4mg  as needed  ATENOLOL 25 MG TABS (ATENOLOL) one by mouth daily  TOPAMAX 25 MG  TABS (TOPIRAMATE) One by mouth daily.  Increase as directed.  DILTIAZEM HCL CR 120 MG  CP12 (DILTIAZEM HCL) daily  CHANTIX STARTING MONTH PAK 0.5 MG X 11 & 1 MG X 42  MISC (VARENICLINE TARTRATE) as directed    Allergies  No Known Allergies    Vital Signs   Height: 61 in.  Weight: 166.0 lbs. BMI (in-lb): 31.48  BSA (m2): 1.75  Temperature: 97.0 degrees  F (oral)  Pulse rate: 89   Blood Pressure   BP #1: 118 / 80 mm Hg  Cuff Size: Std     Pulse Oximetry   O2 Saturation: 98% room air at rest     Intake recorded by: Bunnie Pion RMA  December 03, 2007 8:55 AM      PHYSICAL EXAMINATION  Constitutional: alert, no acute distress, well hydrated, well developed, well nourished.   Skin: normal color, no rashes, no unusual bruising, warm to touch.   Head: atraumatic, normocephalic.   Eyes: pupils equal, no injection, no icterus.   Ears/Nose/Throat: external ears normal, external nose normal, hearing normal.   Neck: no carotid bruits, no thyromegaly.   Chest: no deformities, no apparent respiratory distress, normal percussion, no chest wall tenderness, clear to auscultation, normal breath sounds.        Right Lower: diminished breath sounds.        Left Lower: diminished breath sounds.   Neck Veins: not distended.   Carotids: normal upstroke, bruits.   Palpation: normal.   S1 S2: normal S1 S2.   Extra Sounds: no gallop or rub or click.   Rhythm: regular rate and rhythm.   Ectopy: without ectopy.   Bilateral Lower Extremity Edema: none.   Murmur: none.   Radial - Right: normal.   Radial - Left: diminished.   Abdomen:  nondistended, nontender, normal BS, no organomegaly, no prominent aortic pulsation.   Spine: normal mobility, no deformities.   Extremities: full joint motion, no deformities.   Neuro: cranial nerves grossly intact, non focal.   Psych: affect and mood appropriate, normal interaction.     EKG Interpretation:   No EKG done at this visit.      Assessment and Plan     Problems Assessed Today:   1. CAD (ICD-414.00) with hx of STATUS PTCA (ICD-V45.82) and sx of CHRONIC STABLE ANGINA---- Continue asa, nitrates, bb, statins. Quit smoking.     2. TOBACCO ABUSE (  ICD-305.1) ---------- Most of her problems are related to smoking/ tobbaco abuse. I counselled her to quit smoking and gave her precription of chantix to help her quit it.    3. CAROTID ARTERY DISEASE (ICD-433.10) and STROKE (ICD-434.91) --- Willl see neurology /  Vascular Clinic.    4. CLAUDICATION INTERMITTENT (ICD-443.9) ------- sees Vascular    5.HYPERLIPIDEMIA (ICD-272.4) --------- currently she is taking Lipitor 80, Tricor 145 mg. Check FLP.    6. Left Subclavian Stent :  with her symptoms of left arm numbness, would do a duplex USG on left subclavian; she will f/u with Dr Renato Gails at the end of month.    7.HYPERTENSION (ICD-401.9) -- continue lisinopril 10 mg daily   All non-cardiac problems referred to primary care.    Medications   New Prescriptions/Refills:  PRINIVIL 10 MG TABS (LISINOPRIL) 1 qd  #90 x 1, 12/03/2007, Beverly R McArthur RN  CHANTIX STARTING MONTH PAK 0.5 MG X 11 & 1 MG X 42  MISC (VARENICLINE TARTRATE) as directed  #0 x 0, 12/03/2007, Everitt Amber RN      Today's Orders   Arterial Duplex    Upper  /  Left [[id number]]  Lipid Profile   (FATS) (7600) [[id number]]    DISPOSITION:    Return to clinic in 4 month(s)      Counseling:   Aspirin Therapy  Blood Pressure Management  Diet  Exercise  Lipid Management  Smoking Cessation    CC:    Ian Bushman, MD  Helane Rima                Prescriptions:  PRINIVIL 10 MG TABS (LISINOPRIL) 1 qd   #90 x 1   Entered by: Everitt Amber RN   Authorized by: Collier Flowers MD   Signed by: Everitt Amber RN on 12/03/2007   Method used: Print then Give to Patient   RxID: [id number]  CHANTIX STARTING MONTH PAK 0.5 MG X 11 & 1 MG X 42  MISC (VARENICLINE TARTRATE) as directed  #0 x 0   Entered by: Everitt Amber RN   Authorized by: Collier Flowers MD   Signed by: Everitt Amber RN on 12/03/2007   Method used: Print then Give to Patient   RxID: [id number]    ]

## 2007-12-03 NOTE — Unmapped (Signed)
Signed by Bunnie Pion RMA on 12/03/2007 at 09:32:50      Work Excuse   This patient was seen in the office 12/03/2007.  Comments: It is necessary for Mr. Tracie Romero to driver her to her appointments.  If you have additional questions, please contact Cathy at [phone], or Beverly at [phone]

## 2007-12-03 NOTE — Unmapped (Signed)
Signed by Collier Flowers MD on 12/03/2007 at 00:00:00  Cardiology Review of Systems      Imported By: Coletta Memos 12/10/2007 10:32:51    _____________________________________________________________________    External Attachment:    Please see [employer] EMR for this document.

## 2007-12-05 LAB — LIPID PANEL
Cholesterol, Total: 133 mg/dL (ref 100–199)
HDL: 35 mg/dL — ABNORMAL LOW (ref >39)
LDL Cholesterol: 77 mg/dL (ref 0–99)
Triglycerides: 103 mg/dL (ref 0–149)

## 2007-12-05 NOTE — Unmapped (Signed)
Signed by Generic  UCP Provider on 12/05/2007 at 00:00:00  Vascular Physiological Waveforms      Imported By: Coletta Memos 12/06/2007 14:09:58    _____________________________________________________________________    External Attachment:    Please see [facility] EMR for this document.

## 2007-12-05 NOTE — Unmapped (Signed)
Signed by Denny Peon MD, RPVI, FACS on 12/05/2007 at 00:00:00  Vascular Ultrasound Images      Imported By: Coletta Memos 12/06/2007 14:10:30    _____________________________________________________________________    External Attachment:    Please see [employer] EMR for this document.

## 2007-12-05 NOTE — Unmapped (Signed)
Signed by Collier Flowers MD on 12/11/2007 at 00:38:40  Patient: Tracie Romero  Note: All result statuses are Final unless otherwise noted.  Patient Note: PATIENT WAS FASTING    Tests: (1) Lipid Panel (161096)    Cholesterol, Total        133 mg/dL                   045-409    Triglycerides             103 mg/dL                   8-119    HDL Cholesterol      [L]  35 mg/dL                    >14        According to ATP-III Guidelines, HDL-C >59 mg/dL is considered a  negative risk factor for CHD.  ! VLDL Cholesterol Cal      21 mg/dL                    7-82    LDL Cholesterol Calc      77 mg/dL                    9-56    Note: An exclamation mark (!) indicates a result that was not dispersed into   the flowsheet.  Document Creation Date: 12/06/2007 8:41 AM  _______________________________________________________________________    (1) Order result status: Final  Collection or observation date-time: 12/05/2007 08:48  Requested date-time:   Receipt date-time: 12/05/2007 16:43  Reported date-time: 12/06/2007 08:41  Referring Physician:    Ordering Physician: I Amaka Gluth (ARIFI)  Specimen Source:   Source: [name] Order Number: [id number]  Lab site: Performed At:  [facility] [address]  [place], [place]   [id number] Kalman Jewels MD     Phone:  [phone]

## 2007-12-05 NOTE — Unmapped (Addendum)
Signed by Karenann Cai MD on 12/06/2007 at 17:13:44    [facility]  VASCULAR LAB    Test Performed: Left Subclavian Artery Stent Duplex    Indications:  72 YOF presents with left arm pain. History of left subclavian artery stent 2006.    Ordering Physician: Dr. Collier Flowers    Technologist: Marcy Siren, RVT RDCS RDMS    Findings:         PV (cm/sec)  Right Subclavian Artery     87  Left Subclavian Artery Stent (Origin)  216- 219  Left Subclavian Artery Prox   71  Left Subclavian Artery Mid   66  Left Subclavian Artery Dist   62  Left Axillary Artery     40  Left Brachial Artery Prox   56  Left Brachial Artery Mid   42  Left Brachial Artery Dist   32    IMPRESSION:    Slightly increased flow velocity in the subclavian artery stent, the remaining left subclavian - brachial arteries reveal normal velocities. Normal multiphasic waveforms throughout these segments.    Thank you for referring your patient for diagnostic imaging.  If formal vascular surgery consultation is needed, please call [phone].    Test Interpreted by:        Marrion Coy, MD, RVT, FACS     Professor of Surgery     Director, Division of Vascular Surgery  Signed by Posey Boyer on 12/10/2007 at 09:17:50    ROUTED TO DR.[facility] ARIF

## 2007-12-05 NOTE — Unmapped (Signed)
Signed by Karenann Cai MD on 12/06/2007 at 17:13:44    [facility]  VASCULAR LAB    Upper Extremity Arterial Study       Ordering Physician: Dr. Collier Flowers  Technologist: Marcy Siren, RVT RDCS RDMS    Indications: 39 YOF presents with left arm pain. Hx of left subclavian stent 2006. Hx of coronary stents 2004-2007, HTN, hyperlipidemia, stroke and 44 pack year smoker.    Technical Overview:  Non-invasive upper extremity arterial examination was performed with the patient in a supine position.  Appropriately sized pneumatic cuffs for the segment were applied.  Multilevel segmental pressures were obtained using a continuous wave Doppler and sphygmomanometer to monitor return of arterial flow after cuff occlusion.    Findings- Pressures and Waveforms:     Subclavian:       Triphasic Triphasic    Brachial:               Right:  126 mmHg Triphasic Left:  117 mmHg Triphasic    Radial:               Right:  139 mmHg Triphasic Left:  122 mmHg Triphasic    Ulnar:                Right:  142 mmHg Triphasic Left:  117 mmHg Triphasic    Digit 1:    Normal Normal    Digit 2:    Right:  138 mmHg Normal Left:  123 mmHg Normal    Digit 3:    Normal Normal    Digit 4:    Normal Normal    Digit 5:    Normal Normal    Right Wrist Brachial Index: 1.13  Left Wrist Brachial Index: 0.97    Impression:   Normal right wrist brachial index.   Normal left wrist brachial index.       Thank you for referring your patient for diagnostic imaging.  If formal vascular surgery consultation is needed, please call [phone].      Test Interpreted by:        Marrion Coy, MD, RVT, FACS     Professor of Surgery     Director, Division of Vascular Surgery

## 2007-12-11 NOTE — Unmapped (Addendum)
Signed by Everitt Amber RN on 12/11/2007 at 13:33:31    [facility]  [address]  [place], [place] [address]      December 11, 2007      [facility]  [address]    [place], [place] [address]                                                                                           RE:  TEST RESULTS   (Tracie Romero--Dec 01, 1960)         Dear Ms. Hackel:      The following is an interpretation of your most recent tests.  Please take note of any instructions provided.   Kidney function studies:  Normal    Lipid panel:  Normal          Triglyceride: 103   Cholesterol: 133   LDL: 77   HDL: 35        Health professionals look at cholesterol as more involved than just the total cholesterol. We consider the level of LDL (bad) cholesterol, HDL (good), cholesterol, and Triglycerides in the blood.    1. Your LDL should be under 100, and the HDL should be over 45, if you have any vascular disease such as heart attack, angina, stroke, TIA (mini stroke), claudication (pain in the legs when you walk due to poor circulation),  Abdominal Aortic Aneurysm (AAA), diabetes or prediabetes.    2. Your LDL should be under 130 if you have any two of the following:      a. Smoke or chew tobacco,      b. High blood pressure (if you are on medication or over 140/90 without medication),      c. Female gender,     d. HDL below 40,     e. A female relative (father, brother, or son), who have had any vascular event           as described in #1. above under the age of 60, or a female relative (mother,        sister, or daughter) who had an event as described above under age 31.  (An HDL over 60 will subtract one risk factor from the total, so if you have two items in # 2 above, but an HDL over 60, you then fall into category # 3 below).    3. Your LDL should be under 160 if you have any one of the above.    Triglycerides should be under 200 with the ideal being under 150.   For diabetes or pre-diabetes, the ideal HgbA1C  should be under 6.0%.    If you fall into any of the above categories, you should make a follow up appointment to discuss this with your physician.         Additional Comments: continue to stop smoking       Sincerely,        Everitt Amber RN        ??   Signed by Everitt Amber RN on 12/11/2007 at 13:33:31    mailed to  pt home

## 2007-12-25 NOTE — Unmapped (Addendum)
Signed by Marijean Heath on 12/25/2007 at 14:15:23        [facility]   [address]  [place], [place] [address]   [phone]  Fax: [phone]                 December 03, 2007                    RE: SAMYIA MOTTER   DOB:  July 13, 1961      Dear Jerrilyn Cairo,    I had the pleasure of seeing your patient, TRYNITI LAATSCH, at your request in our Cardiology office.  Attached, please find my office note and recommendations regarding this patient.    Thank you for allowing me to see this patient.  I will keep you informed of their progress.    Best personal regards.    Sincerely,          Collier Flowers, MD, [facility]  Assistant Professor of Clinical Medicine  Section of Interventional Cardiology  Division of Cardiovascular Services      CC  Ian Bushman, MD            Signed by Marijean Heath on 12/25/2007 at 14:15:53    routed to dr. Phoebe Sharps

## 2007-12-26 NOTE — Unmapped (Signed)
Signed by Karenann Cai MD on 12/27/2007 at 18:12:11    [facility]  VASCULAR LAB    CAROTID ARTERY DUPLEX      Carotid Artery Duplex     Ordering Physician: Dr. Colbert Ewing  Technologist: Joanne Gavel, RVT    Indications: Carotid stenosis  Left Subclavian stent  Right Brachial BP: 157 mmHg  Left Brachial BP: 140 mmHg    Technical Overview:  Real time gray-scale imaging along with spectral and color Doppler were used to evaluate the bilateral extracranial carotid arteries including the common carotid artery starting at the base of the neck, extending to the bifurcation, the proximal external carotid artery and the internal carotid artery, beginning at the origin and extending as far as it can be visualized.  Imaging was performed in longitudinal and transverse planes.  Spectral Doppler information was obtained in a longitdudinal orientation with a 60 degree or less angle throughout the extracranial carotids allowing analysis of the Doppler velocities to quantify severity of disease.  The proximal subclavian and mid vertebral arteries are routinely included in this evaluation.      SPECTRAL DOPPLER FLOW ANALYSIS  RIGHT PSV EDV LEFT PSV EDV   ICA: P 109 cm/s 45 cm/s ICA: P 209 cm/s 89 cm/s   ICA: M 97 cm/s 40 cm/s ICA: M 167 cm/s 61 cm/s   ICA: D 96 cm/s 36 cm/s ICA: D 108 cm/s 35 cm/s   CCA: P 102 cm/s 31 cm/s CCA: P 117 cm/s 24 cm/s   CCA: M-D 76 cm/s 27 cm/s CCA: M-D 92 cm/s 39 cm/s   ECA: 105 cm/s 29 cm/s ECA: 135 cm/s 39 cm/s   VERT: 80 cm/s 29 cm/s VERT: 66 cm/s 34 cm/s   VERT Antegrade VERT: Interrupted systolic peak   SUBCL: 303  cm/s 0 cm/s SUBCL: 277 cm/s 0 cm/s   SUBCL: Turbulent, biphasic SUBCL: Turbulent, biphasic   ICA/CCA RATIO: 1.43  ICA/CCA RATIO: 2.27            Right:   Plaque identified in the right bifurcation appears heterogenous, smooth  Plaque identified in the right internal carotid appears heterogenous, irregular    Left:   Plaque identified in the left bifurcation  appears heterogenous, irregular  Plaque identified in the left internal carotid appears heterogenous, irregular    ICA Diameter Measurements:   Diameter measurements made in the proximal right ICA show minimum diameter of .28 cm compared to distal ICA of .35 cm with diameter reduction of 20%.   Diameter measurements made in the proximal left ICA show minimum diameter of .21 cm compared to distal ICA of .39 cm with diameter reduction of 46%.     Impression:   Right ICA disease category is 20-49%     Left ICA disease category is 50-69%     Right vertebral flow direction is antegrade.    Left vertebral flow direction is bidirectional.    Bilateral subclavian stenosis identified along with elevated velocities and turbulence noted in the Brachiocephalic artery on the right.  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    No change in carotid disease category compared to prior exam 9-08.      Thank you for referring your patient for diagnostic imaging.  If formal vascular surgery consultation is needed, please call [phone].      Test Interpreted by:        Marrion Coy, MD, RVT, FACS     Professor  of Surgery     Director, Division of Vascular Surgery

## 2007-12-26 NOTE — Unmapped (Signed)
Signed by Karenann Cai MD on 12/27/2007 at 18:12:41    [facility]  VASCULAR LAB    Lower Extremity Arterial Study       Ordering Physician: Dr. Colbert Ewing  Technologist: Joanne Gavel, RVT    Indications: Peripheral Vascular Disease    Technical Overview:  Non-invasive lower extremity arterial examination was performed with the patient in a supine position.  Appropriately sized pneumatic cuffs for the segment were applied.   Pressures were obtained using continuous wave Doppler or PPG and sphygmomanometer to monitor return of arterial flow after cuff occlusion.     Findings - Pressures and Waveforms:     Common Femoral:          Rt:  Biphasic    Lt:  Triphasic    Superficial Femoral:          Rt:  Biphasic    Lt:  Triphasic    Popliteal:                        Rt:  Biphasic    Lt:  Triphasic    Brachial:                Rt:  157 mmHg  Triphasic  Lt:  140 mmHg Triphasic    Posterior Tibial:     Rt:  115 mmHg  Monophas.  Lt:  156 mmHg Triphasic    Dorsalis Pedis:      Rt:  103 mmHg  Monophas.  Lt:  135 mmHg Biphasic    Toes:            Rt:  81 mmHg  abnormal  Lt:  105 mmHg normal    Right Ankle Brachial Index: .73 Left Ankle Brachial Index: .99    Right Toe Brachial Index: .52 Left Toe Brachial Index: .67      Post Exercise - Pressures and Waveforms:     Comments:   Patient reported having significant chest pain yesterday and so no exercise testing was performed with today's exam.    Comparison to Previous Exam:   ABI's reported 9-08 were: right .98 and left 1.14  Significant decrease on today's exam: right ABI now .73   Left ABI of .99 has decreased from pior exam, but remains in the normal range    Patient reprted having significant chest pain yesterday and so no exercise testing was performed with today's exam.    Impression:   Right Iliac Arterial Occlusive Disease.  Left Tibial Arterial Occlusive Disease.    ABI Comment:   Left ABI normal at rest.  Right ABI in moderate claudication  range.      Thank you for referring your patient for diagnostic imaging.  If formal vascular surgery consultation is needed, please call [phone].    Test Interpreted by:        Marrion Coy, MD, RVT, FACS     Professor of Surgery     Director, Division of Vascular Surgery

## 2007-12-26 NOTE — Unmapped (Signed)
Signed by Karenann Cai MD on 12/26/2007 at 00:00:00  Vascular Ultrasound Images      Imported By: Kathlynn Grate 12/30/2007 13:54:59    _____________________________________________________________________    External Attachment:    Please see [employer] EMR for this document.

## 2007-12-26 NOTE — Unmapped (Signed)
Signed by Annalee Genta on 12/26/2007 at 14:14:51      Spectral Doppler Flow Analysis:     RICA:   Proximal PSV: 109  Proximal EDV: 45  Mid PSV: 97  Mid EDV: 40  Distal PSV: 96  Distal EDV: 36    RCCA:   Proximal PSV: 102  Proximal EDV: 31  Mid-Distal PSV: 76  Mid-Distal EDV: 27    RECA:   PSV: 105  EDV: 29    RVert:   PSV: 80  EDV: 29  Antegrade    RSubclavian:   PSV: 303  Multiphasic    Ratio:   RICA: 109/ RCCA: 76  RICA/RCCA Ratio: 1.43    LICA:   Proximal PSV: 209  Proximal EDV: 89  Mid PSV: 167  Mid EDV: 61  Distal PSV: 108  Distal EDV: 35    LCCA:   Proximal PSV: 117  Proximal EDV: 24  Mid-Distal PSV: 92  Mid-Distal EDV: 39    LECA:   PSV: 135  EDV: 39    LVert:   PSV: 66  EDV: 34  Comments: Bidirectional    LSubclavian:   PSV: 277  Multiphasic    Ratio:   LICA: 209/ LCCA: 92  LICA/LCCA Ratio: 2.27

## 2007-12-26 NOTE — Unmapped (Signed)
Signed by Colbert Ewing MD on 12/26/2007 at 00:00:00  Vascular Physiological Waveforms      Imported By: Kathlynn Grate 12/30/2007 13:52:46    _____________________________________________________________________    External Attachment:    Please see [facility] EMR for this document.

## 2007-12-27 NOTE — Unmapped (Signed)
Signed by Everitt Amber RN on 12/27/2007 at 14:12:31    Prescriptions:  CHANTIX STARTING MONTH PAK 0.5 MG X 11 & 1 MG X 42  MISC (VARENICLINE TARTRATE) as directed  #0 x 2   Entered by: Everitt Amber RN   Authorized by: Collier Flowers MD   Signed by: Everitt Amber RN on 12/27/2007   Method used: Telephoned to ...        RxID: [id number]    Clinical Lists Changes    Medications:  Rx of CHANTIX STARTING MONTH PAK 0.5 MG X 11 & 1 MG X 42  MISC (VARENICLINE TARTRATE) as directed;  #0 x 2;  Signed;  Entered by: Everitt Amber RN;  Authorized by: Collier Flowers MD;  Method used: Telephoned to   Called to [facility]   [phone]

## 2008-01-09 NOTE — Unmapped (Signed)
Signed by Everitt Amber RN on 01/09/2008 at 09:47:02    Phone Note   Patient Call  Call back at Home Phone: [phone]  Caller: patient  Department: IM - Cardiology    Reason for Call: speak with nurse  Reason for call: pt requesting 'nerve pills'  Summary of Call: pt called asking for 'nerve pills' due to trying to quit smoking.  Pt referred to PCP.  Instructed pt to make follow up appt with Dr. Richardine Service.  Phone number to front desk given to pt.      Initial call taken by: Everitt Amber RN,  January 09, 2008 9:46 AM

## 2008-01-10 NOTE — Unmapped (Signed)
Signed by Harless Litten DO on 01/10/2008 at 17:23:21      Reason for Visit   Chief Complaint: check up refill on meds       Allergies  No Known Allergies    Medications   PLAVIX 75 MG TABS (CLOPIDOGREL BISULFATE) 1 qd  ASPIRIN 81 MG TBEC (ASPIRIN) 1 qd  PRINIVIL 10 MG TABS (LISINOPRIL) 1 qd  FOLIC ACID 1 MG TABS (FOLIC ACID) 1 qd  ISOSORBIDE MONONITRATE CR 60 MG TB24 (ISOSORBIDE MONONITRATE) 1 qd  XANAX 1 MG TABS (ALPRAZOLAM) 1 by mouth twice a day as needed  LIPITOR 80 MG TABS (ATORVASTATIN CALCIUM) daily  TRICOR 145 MG TABS (FENOFIBRATE) qd  NITROGLYCERIN SUBL (NITROGLYCERIN SUBL) 0.4mg  as needed  ATENOLOL 25 MG TABS (ATENOLOL) one by mouth daily  TOPAMAX 25 MG  TABS (TOPIRAMATE) One by mouth daily.  Increase as directed.  DILTIAZEM HCL CR 120 MG  CP12 (DILTIAZEM HCL) daily  CHANTIX STARTING MONTH PAK 0.5 MG X 11 & 1 MG X 42  MISC (VARENICLINE TARTRATE) as directed       Vital Signs:   Ht: 62 in.  Wt: 172 lbs.      BMI: 31.57  BSA: 1.79  Wt chg (lbs): 6  BP: 140/90    Intake recorded by: Letta Pate MA on January 10, 2008 3:34 PM      HPI Multiple Chronic   Anxiety/Depression        Current Status: Worsening       Compliance with Treatment: Fair       Medication Tolerance Fair       Comments: Pt forgets that she took Xanax and has on occasion taken it again and ended up getting a DUI.  Husband now will not let her take Xanax any longer.  Pt still very anxious.  Hyperlipidemia       Current Status: Stable       Non Pharmacologic treatment: Exercise, Low fat/Low cholesterol diet       Compliance of treatment: Good       Compliance with diet: Fair       Compliance with exercise: Fair       Medication tolerance: Good  Hypertension       Current Status: Unchanged       Home BP Monitoring: No       Non Pharmacologic treatment: Exercise       Compliance with treatment: Good       Medication Tolerance: Good    Other Diagnosis:    Quit smoking over 1 month ago with Chantix.  Pt having alot of trouble.      Review  of Systems  General: Denies fevers, chills, sweats.       Physical Examination:   BP: 140/  90    Additional Vitals:   BP: 154 / 94    Physical Exam- Detail:   General Appearance: well-developed, well-nourished and in no acute distress.  Ears: No lesions.  Tympanic membranes translucent, non-bulging.  Canal walls pink, without discharge.  Hearing grossly intact.  Oropharynx: Normal appearance.  No erythema, exudate or mass. No tonsillar swelling.  Oral Cavity: Gums pink, good dentition.  Oral mucosa and tongue without lesions.  Respiratory: Respiration un-labored.  Lung fields clear to auscultation.  No wheezing, rales, rhonchi or pleural rub.  Neck: Bilateral mild carotic bruit  Lymphatic: Areas palpated not enlarged:  cervical, supraclavicular.  Breast: Breasts symmetrical.  No lumps, masses, discharge, tenderness or dimpling.  Vascular: No carotid bruits.  No edema or varicosities.           New Problems:  ANXIETY DISORDER, GENERALIZED (ICD-300.02)  New Medications:  TENORMIN 50 MG  TABS (ATENOLOL) 1 by mouth daily  BUSPAR 15 MG  TABS (BUSPIRONE HCL) 1 by mouth twice a day      Preventive Maintenance       Coordinating Care Providers   PCP Name: Ian Bushman, MD  Cardiologist: Dr. Matthias Hughs  Vascular: Dr. Colbert Ewing            Prescriptions:  TENORMIN 50 MG  TABS (ATENOLOL) 1 by mouth daily  #30 x 5   Entered and Authorized by: Harless Litten DO   Signed by: Harless Litten DO on 01/10/2008   Method used: Print then Give to Patient   RxID: [id number]  BUSPAR 15 MG  TABS (BUSPIRONE HCL) 1 by mouth twice a day  #60 x 5   Entered and Authorized by: Harless Litten DO   Signed by: Harless Litten DO on 01/10/2008   Method used: Print then Give to Patient   RxID: [id number]      Assessment and Plan  Status of Existing Problems:  Assessed HYPERTENSION as deteriorated - Increase the atenolol to 50mg  daily - Harless Litten DO - Signed  Assessed ANXIETY DISORDER, GENERALIZED as new -  Stop the Xanax.  Start Buspar 15mg  1/2 twice a day x 1 week then 1 twice a day  - Harless Litten DO - Signed  Assessed HYPERLIPIDEMIA as unchanged - Continue medications - Harless Litten DO - Signed  Assessed TOBACCO ABUSE as improved - Continue Chantix and cessation - Harless Litten DO - Signed  New Problems:  Dx of ANXIETY DISORDER, GENERALIZED (ICD-300.02)     Medications   New Prescriptions/Refills:  TENORMIN 50 MG  TABS (ATENOLOL) 1 by mouth daily  #30 x 5, 01/10/2008, Earl Lites A Arval Brandstetter DO  BUSPAR 15 MG  TABS (BUSPIRONE HCL) 1 by mouth twice a day  #60 x 5, 01/10/2008, Harless Litten DO    New medications:  TENORMIN 50 MG  TABS -- 1 by mouth daily  Start date: 01/10/2008  BUSPAR 15 MG  TABS -- 1 by mouth twice a day  Start date: 01/10/2008    Patient Instructions   Stop Xanax.  Start Buspar 15mg  1/2 by mouth twice a day for 1 week then 1 by mouth twice a day .  Increase the atenolol to 50mg  daily.  Continue Chantix and avoid smoking.  Continue your other medications.  Recheck in 6 months.      Today's Orders   99214 - Ofc Vst, Est Level IV [CPT-99214]    Disposition:   Return to clinic for Doctor Visit in 6 month(s)

## 2008-01-16 NOTE — Unmapped (Signed)
Signed by Colbert Ewing MD on 01/20/2008 at 12:16:10    VASCULAR SURGERY FOLLOW-UP VISIT    Chief Complaint: Carotid Stenosis    History of Present Illness     Carotid/Upper Extremity Arterial:   She has carotid stenosis.  It has affected the right side, left side.  It has caused weakness of the right leg, weakness of the  left leg, pain right arm, pain left arm.  The patient describes it as heaviness in character.  It occurs daily.  The exacerbating factors are exercise, activity.  It is often associated with shortness of breath.      Venous:     She has had bilateral lower extremity pain.  It is located in the right leg, left leg, right foot, left foot.  She describes it as aching, heaviness in character.  It is severe in severity.  The patient states that prolonged standing/sitting, menstrual cycle exacerbates the pain/swelling.  The pain occurs daily.  This patient is not using compression therapy.      Past History  Social History: Marital Status: married,   Children: 2,   Employment Status: disabled,   Patient Lives at: home,   Support System: excellent  Caffeine per Day: 4+  Seatbelt Use: 100 % of the time  Alcohol Use: none  Drug Use: none  Tobacco Usage:smoker  Cigarettes-Year Started: 1969,   Cigarettes-Years smoked- 40,   Cigarettes-Packs per Day- 1 cig per week,   Pack/Years- 0    Review of Systems    General: Complains of night sweats. Denies chills, fatigue, fevers, not resting well, poor appetite, weight loss, weight gain.   Cardiac: Complains of dizziness, dyspnea on exertion, dyspnea at rest, palpitations. Denies chest pain, peripheral edema, orthopnea, fainting.   Vascular: Complains of lower extremity pain. Denies blindness -one eye, lower extremity claudication, lower extremity swelling and heaviness, lower extremity pain at night, lower extremity ulcer, stroke in last 6 months, TIA in past 6 months, upper extremity claudication, upper extremity weakness, upper extremity swelling, varicose veins.    Medications    PLAVIX 75 MG TABS (CLOPIDOGREL BISULFATE) 1 qd  ASPIRIN 81 MG TBEC (ASPIRIN) 1 qd  FOLIC ACID 1 MG TABS (FOLIC ACID) 1 qd  ISOSORBIDE MONONITRATE CR 60 MG TB24 (ISOSORBIDE MONONITRATE) 1 qd  LIPITOR 80 MG TABS (ATORVASTATIN CALCIUM) daily  TRICOR 145 MG TABS (FENOFIBRATE) qd  NITROGLYCERIN SUBL (NITROGLYCERIN SUBL) 0.4mg  as needed  ATENOLOL 25 MG TABS (ATENOLOL) one by mouth daily  XANAX 1 MG TABS (ALPRAZOLAM)   * CHANTIX - CONTINUING MONTH as directed    Allergies  No Known Allergies    Dominant Hand: Right      Vital Signs   Height: 62 in.  Weight: 153 lbs. BMI (in-lb): 28.09  BSA (m2): 1.71  Temperature: 97.2 degrees  F (oral)  Pulse rate: 62 Pulse rhythm: regular Respirations: 12    Pain Level: 0  Note: Pain level is assessed on a 10 point scale. 0 being pain free, 1 being barely noticeable pain and 10 being the worst pain patient has ever felt.    Blood Pressure   BP #1: 120 / 76mm Hg  Cuff Size: Std #1: left arm     Intake recorded by: Servando Salina MA  January 16, 2008 9:14 AM      Physical Examination  General: alert, no acute distress, well hydrated.   Eyes: no injection, no nystagmus, no icterus, vision grossly normal.   Neck: supple, normal carotid  pulses, right carotid bruit, left carotid bruit.   Chest/Respiratory: clear to auscultation, no deformities, no rales, no rhonchi, no wheezing, no respiratory distress.   Cardiac: regular rate and rhythm, no gallop or rub or click.   Murmur:  none.   Wounds (RLE):  none.   Wounds (LLE):  none.   Posterior Tibial Pulse- Right: diminished.   Posterior Tibial Pulse- Left: normal.   Dorsalis Pedis Pulse- Right: diminished.   Dorsalis Pedis Pulse- Left: normal.   [facility]  VASCULAR LAB    CAROTID ARTERY DUPLEX      Carotid Artery Duplex     Ordering Physician: Dr. Colbert Ewing  Technologist: Joanne Gavel, RVT    Indications: Carotid stenosis  Left Subclavian stent  Right Brachial BP: 157 mmHg  Left Brachial BP: 140  mmHg    Technical Overview:  Real time gray-scale imaging along with spectral and color Doppler were used to evaluate the bilateral extracranial carotid arteries including the common carotid artery starting at the base of the neck, extending to the bifurcation, the proximal external carotid artery and the internal carotid artery, beginning at the origin and extending as far as it can be visualized.  Imaging was performed in longitudinal and transverse planes.  Spectral Doppler information was obtained in a longitdudinal orientation with a 60 degree or less angle throughout the extracranial carotids allowing analysis of the Doppler velocities to quantify severity of disease.  The proximal subclavian and mid vertebral arteries are routinely included in this evaluation.      SPECTRAL DOPPLER FLOW ANALYSIS  RIGHT PSV EDV LEFT PSV EDV   ICA: P 109 cm/s 45 cm/s ICA: P 209 cm/s 89 cm/s   ICA: M 97 cm/s 40 cm/s ICA: M 167 cm/s 61 cm/s   ICA: D 96 cm/s 36 cm/s ICA: D 108 cm/s 35 cm/s   CCA: P 102 cm/s 31 cm/s CCA: P 117 cm/s 24 cm/s   CCA: M-D 76 cm/s 27 cm/s CCA: M-D 92 cm/s 39 cm/s   ECA: 105 cm/s 29 cm/s ECA: 135 cm/s 39 cm/s   VERT: 80 cm/s 29 cm/s VERT: 66 cm/s 34 cm/s   VERT Antegrade VERT: Interrupted systolic peak   SUBCL: 303  cm/s 0 cm/s SUBCL: 277 cm/s 0 cm/s   SUBCL: Turbulent, biphasic SUBCL: Turbulent, biphasic   ICA/CCA RATIO: 1.43  ICA/CCA RATIO: 2.27            Right:   Plaque identified in the right bifurcation appears heterogenous, smooth  Plaque identified in the right internal carotid appears heterogenous, irregular    Left:   Plaque identified in the left bifurcation appears heterogenous, irregular  Plaque identified in the left internal carotid appears heterogenous, irregular    ICA Diameter Measurements:   Diameter measurements made in the proximal right ICA show minimum diameter of .28 cm compared to distal ICA of .35 cm with diameter reduction of 20%.   Diameter measurements made in the proximal  left ICA show minimum diameter of .21 cm compared to distal ICA of .39 cm with diameter reduction of 46%.     Impression:   Right ICA disease category is 20-49%     Left ICA disease category is 50-69%     Right vertebral flow direction is antegrade.    Left vertebral flow direction is bidirectional.    Bilateral subclavian stenosis identified along with elevated velocities and turbulence noted in the Brachiocephalic artery on the right.  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    No change in carotid disease category compared to prior exam 9-08.      Thank you for referring your patient for diagnostic imaging.  If formal vascular surgery consultation is needed, please call [phone].      Test Interpreted by:        Marrion Coy, MD, RVT, FACS     Professor of Surgery     Director, Division of Vascular Surgery      Signed by Karenann Cai MD on 12/27/2007 at 6:12 PM    ________________________________________________________________________    [facility]  VASCULAR LAB    Lower Extremity Arterial Study       Ordering Physician: Dr. Colbert Ewing  Technologist: Joanne Gavel, RVT    Indications: Peripheral Vascular Disease    Technical Overview:  Non-invasive lower extremity arterial examination was performed with the patient in a supine position.  Appropriately sized pneumatic cuffs for the segment were applied.   Pressures were obtained using continuous wave Doppler or PPG and sphygmomanometer to monitor return of arterial flow after cuff occlusion.     Findings - Pressures and Waveforms:     Common Femoral:          Rt:  Biphasic    Lt:  Triphasic    Superficial Femoral:          Rt:  Biphasic    Lt:  Triphasic    Popliteal:                        Rt:  Biphasic    Lt:  Triphasic    Brachial:                Rt:  157 mmHg  Triphasic  Lt:  140 mmHg Triphasic    Posterior Tibial:     Rt:  115 mmHg  Monophas.  Lt:  156 mmHg Triphasic    Dorsalis Pedis:      Rt:  103  mmHg  Monophas.  Lt:  135 mmHg Biphasic    Toes:            Rt:  81 mmHg  abnormal  Lt:  105 mmHg normal    Right Ankle Brachial Index: .73 Left Ankle Brachial Index: .99    Right Toe Brachial Index: .52 Left Toe Brachial Index: .67      Post Exercise - Pressures and Waveforms:     Comments:   Patient reported having significant chest pain yesterday and so no exercise testing was performed with today's exam.    Comparison to Previous Exam:   ABI's reported 9-08 were: right .98 and left 1.14  Significant decrease on today's exam: right ABI now .73   Left ABI of .99 has decreased from pior exam, but remains in the normal range    Patient reprted having significant chest pain yesterday and so no exercise testing was performed with today's exam.    Impression:   Right Iliac Arterial Occlusive Disease.  Left Tibial Arterial Occlusive Disease.    ABI Comment:   Left ABI normal at rest.  Right ABI in moderate claudication range.        Assessment and Plan   Stable carotid and peripheral arterial exam with L internal carotid artery stenosis unchanged by Duplex. Continue ECASA. Encouraged her to call her PCP or Cardiologist regarding the chest pressure and pain she ihas been experiencing over the  past few weeks.     DISPOSITION:    Return to office in 1 year(s)     Vascular Orders     Cerebrovascular Duplex:   Indication 1: Known disease w/o stroke 433.10  Carotid Duplex- Complete    Arterial Testing   Indication 1: Claudication 440.21  ABI- LE    cc:   Ian Bushman, MD  [facility]  Dr. Marda Stalker            ]

## 2008-01-17 NOTE — Unmapped (Signed)
Signed by April A Napier MA on 01/17/2008 at 15:18:58    [facility] - PRELOAD      Preload Clinical Lists   Problems added:   ANXIETY DISORDER, GENERALIZED (ICD-300.02)  ARM PAIN, LEFT (ICD-729.5)  OTHER DISEASES OF LUNG NOT ELSEWHERE CLASSIFIED (ICD-518.89)  EMPHYSEMA (ICD-492.8)  PRINZMETAL'S ANGINA (ICD-413.1)  STATUS, PTCA (ICD-V45.82)  TOBACCO ABUSE (ICD-305.1)  STROKE (ICD-434.91)  TIA (ICD-435.9)  CLAUDICATION, INTERMITTENT (ICD-443.9)  HYPERLIPIDEMIA (ICD-272.4)  CAROTID ARTERY DISEASE (ICD-433.10)  HYPERTENSION (ICD-401.9)  DEPRESSION (ICD-311)  CAD (ICD-414.00)  HEADACHE (ICD-784.0)  SPELLS (ICD-780.39)    Medications added:   PLAVIX 75 MG TABS (CLOPIDOGREL BISULFATE) 1 qd  ASPIRIN 81 MG TBEC (ASPIRIN) 1 qd  PRINIVIL 10 MG TABS (LISINOPRIL) 1 qd  FOLIC ACID 1 MG TABS (FOLIC ACID) 1 qd  ISOSORBIDE MONONITRATE CR 60 MG TB24 (ISOSORBIDE MONONITRATE) 1 qd  LIPITOR 80 MG TABS (ATORVASTATIN CALCIUM) daily  TRICOR 145 MG TABS (FENOFIBRATE) qd  NITROGLYCERIN SUBL (NITROGLYCERIN SUBL) 0.4mg  as needed  TENORMIN 50 MG  TABS (ATENOLOL) 1 by mouth daily  TOPAMAX 25 MG  TABS (TOPIRAMATE) One by mouth daily.  Increase as directed.  DILTIAZEM HCL CR 120 MG  CP12 (DILTIAZEM HCL) daily  CHANTIX STARTING MONTH PAK 0.5 MG X 11 & 1 MG X 42  MISC (VARENICLINE TARTRATE) as directed  BUSPAR 15 MG  TABS (BUSPIRONE HCL) 1 by mouth twice a day        Previous Echocardiogram:  12/25/06-ECHO   SUMMARY   -  Overall left ventricular systolic function was normal. Left         ventricular ejection fraction was estimated to be 60 % in the         range of 55 % to 60 %. There were no left ventricular         regional wall motion abnormalities. There was an increased         relative contribution of atrial contraction to left         ventricular filling, consistent with diastolic dysfunction.      Previous Heart Catheterization/PCI:  06/28/07-  1. Left heart catheterization.   2. Selective cholangiography.   3. Left ventriculogram.   4. Selective right femoral arteriography.   5. Ascending aorta arteriography.    FINAL DIAGNOSIS:     1. Diffuse non-occlusive coronary artery disease.  Patent stent and obtuse     marginal branch and diagonal branch and mid RCA with TIMI-3 flow.    04.18.08:  POSTPROCEDURE DIAGNOSES:     1. Moderate mid left anterior descending stenosis of 50-60% with known     critical fractional flow reserve 0.83.   2. Widely patent left circumflex and first diagonal stents.   3. Preserved left ventricular function with ejection fraction of 60-65% with     normal wall motion and normal filling pressures   4. Normal central aortic pressure 120/70.   5. Moderate left subclavian stenosis approximately 60% with 20 mm     translesional gradient.   6. Diffuse moderate aortoiliac disease without obvious renal artery stenosis.  CONCLUSIONS:     1. Moderate mid left anterior descending stenosis of 50-60% with known     critical fractional flow reserve 0.83.   2. Widely patent left circumflex and first diagonal stents.   3. Preserved left ventricular function with ejection fraction of 60-65% with     normal wall motion and normal filling pressures   4. Normal central aortic pressure  120/70.   5. Moderate left subclavian stenosis approximately 60% with 20 mm     translesional gradient.   6. Diffuse moderate aortoiliac disease without obvious renal artery stenosis.    04.16.08  PROCEDURES PERFORMED:     1.  Aortogram with runoff.   2.  Selective renal angiography.   3.  Selective iliac artery injections.     IMPRESSION:     1.  The mild aortoiliac atherosclerosis with 30% distal aortic obstruction.   2.  A 30% obstruction of the left common iliac.   3.  A 70% obstruction at the ostium of the left renal artery.    04.09.07:  POSTOPERATIVE DIAGNOSIS(ES):     1.  Coronary arteries are free of significant fixed disease.   2.  Diffuse left circumflex and LAD spasm without chest pain relieved with        nitroglycerin.   3.  Normal left  ventricular function by angiography and by end diastolic   pressure.    10.30.06:     POSTOPERATIVE DIAGNOSIS(ES):     1.  Two-vessel coronary artery disease, manifested as moderate disease of the   LAD and severe in-stent restenosis of the circumflex.   2.  Normal left ventricular systolic function.   3.  No mitral regurgitation.   4.  Successful PTCA and stent of the circumflex OM in-stent restenosis with 2   separate TAXUS drug-eluting stents.    01.09.06:  ANGIOGRAPHIC RESULTS:    1.        The circumflex is nondominant, distributing as a large OM branch and            modest posterolateral branch.  There was a mid stent distal to the            takeoff of the PL branch, which had a focal mid in-stent 60%            obstruction that was reduced to 0% with angioplasty and stenting.            There was noted to be spasm intraprocedurally completed with            nitroglycerin.      IMPRESSION:    1.        Successful revascularization to the mid circumflex artery at a site of            previous in-stent restenosis.      Previous Nuclear Study:  07/03/07-Stress Test  FINAL INTERPRETATION There is normal myocardial perfusion.  Overall study quality is excellent.   Scan significance indicates low cardiac risk.    PERFUSION FINDINGS There is normal left ventricular perfusion.   There is no evidence of visual transient dilation with a normal stress/rest left ventricular volume ratio of 1.10.    The right ventricle is normal.  EKG DATA: Rest EKG: Sinus rhythm/Normal conduction/No arrhythmias/Normal repolarizationStress EKG: Sinus rhythm/No arrhythmias/Normal pattern/Nondiagnostic low heart rate EKG findings (only): Nondiagnostic EKG due to inadequate heart rate    12/25/06-Stress Test  IMPRESSION:    NO EVIDENCE OF STRESS-INDUCED REVERSIBLE MYOCARDIAL ISCHEMIA.      Previous EKG/Holter/Event Monitor:  07/03/07-EKG  Ventricular Rate  67 BPM   Atrial Rate  67 BPM   P-R Interval  98 ms   QRS Duration  94 ms   QT  386  ms   QTc  407 ms   P Axis  11 degrees   R Axis  11 degrees   T Axis  1 degrees    Previous US/Vascular:  12/05/07 Test Performed: Left Subclavian Artery Stent Duplex  IMPRESSION:  Slightly increased flow velocity in the subclavian artery stent, the remaining left subclavian - brachial arteries reveal normal velocities. Normal multiphasic waveforms throughout these segments.      12/26/07 Lower Extremity Arterial Study  Impression:   Right Iliac Arterial Occlusive Disease. Left Tibial Arterial Occlusive Disease.  ABI Comment:   Left ABI normal at rest.  Right ABI in moderate claudication range.           12/25/06-Venous Duplex  MPRESSION:    1.  ESTIMATED DIAMETER REDUCTION OF THE RIGHT INTERNAL CAROTID ARTERY IS 20-49%.    2.  ESTIMATED DIAMETER REDUCTION OF THE LEFT INTERNAL CAROTID ARTERY IS AT THE LOWER LIMITS OF 50-69%.    3.  BOTH VERTEBRAL ARTERIES ARE PATENT WITH ANTEGRADE FLOW.    THIS REPRESENTS NO SIGNIFICANT CHANGE COMPARED TO THE PREVIOUS STUDY DATED 05/18/06.    11.16.08  CAROTID DUPLEX STUDY     FINDINGS:  Brachial pressure is 122 mmHg on the right and 119 mmHg on the   left.     B-mode ultrasound reveals a large amount of nonechogenic plaque in both   carotid bulbs and proximal internal carotid arteries.  Doppler flow analysis   demonstrates a peak systolic velocity of 103 cm/second with an end-diastolic   velocity of 46 cm/second and severe spectral broadening in the right internal   carotid artery.  In the left internal carotid artery the peak systolic   velocity is 175 cm/second with an end-diastolic velocity of 86 cm/second and   severe spectral broadening. There is antegrade flow in both vertebral   arteries.     IMPRESSION:     1. Moderate right internal carotid artery stenosis in the 16-49% range.   2. Severe left internal carotid artery stenosis in the 50-79% range.        Previous Radiology:  06/28/07-Chest Xray  Impression:    No evidence of acute cardiopulmonary disease    Coronary Risk Factor  Assessment   Cigarette Smoker: smoker   HBP: no   Hyperlipidemia: yes     Labs:   Previous Cholesterol: 133 (12/05/2007 8:48:00 AM)  Previous LDL: 77 (12/05/2007 8:48:00 AM)  Previous HDL: 35 (12/05/2007 8:48:00 AM)  Previous Triglyceride: 103 (12/05/2007 8:48:00 AM)    Goals:     LDL Goal: <70  Diabetes: no   Family History of CAD: yes    Peripheral Vascular Disease: yes     Primary Care Physician:   Primary Care Provider: Ian Bushman, MD    Other Coordinating Care Providers   Consult Requested By: Helane Rima      Cardiology HPI:   12/03/07  Tracie Romero is a 47 Years Old Female who was seen at the request of [employer].  Tracie Romero is a 47 Years Old Female who was seen at the request of Helane Rima.      29 y white female, with history of CAD, multiple PCIs/ Stents, Peripheral arterial disease, Carotid disease, Subclavian disease, hx of CVA, and continued [employer] abuse/ Nicotine addiction.    She has stable symptoms of chest pain/ chronic stable angina. She is taking good medical therapy and antianginals. She has Intermediate stenosis in Mid LAD but rather diffuse disease. Her FFR accross that lesion in March at [facility] was 0.83.    Unfortunately she is still smoking. She want s to quit and will try  Chantix    She sees Dr Renato Gails, for Carotid disease and PAD. Her rest and exercise ABI were mildly abnl for right sided disease. She also has mild to moderate b/l carotid disease and previous stent in left subclavian. She was advised to quit smoking, modify her risk factors.  Lately she has symptoms of left arm numbness, and mild swelling. Her BP in left arm is 120, in right arm is 130 systolic.    She also f/u with Neurology for symptoms of headaches, and carotid disease.       Cardiology Problem List:   OTHER DISEASES OF LUNG NOT ELSEWHERE CLASSIFIED (ICD-518.89)  EMPHYSEMA (ICD-492.8)  PRINZMETAL'S ANGINA (ICD-413.1)  STATUS, PTCA (ICD-V45.82)  TOBACCO ABUSE  (ICD-305.1)  STROKE (ICD-434.91)  TIA (ICD-435.9)  CLAUDICATION, INTERMITTENT (ICD-443.9)  HYPERLIPIDEMIA (ICD-272.4)  CAROTID ARTERY DISEASE (ICD-433.10)  HYPERTENSION (ICD-401.9)  DEPRESSION (ICD-311)  CAD (ICD-414.00)  HEADACHE (ICD-784.0)  SPELLS (ICD-780.39)      Cardiology Assessment:   Allergies  No Known Allergies    Vital Signs   Height: 61 in.  Weight: 166.0 lbs. BMI (in-lb): 31.48  BSA (m2): 1.75  Temperature: 97.0 degrees  F (oral)  Pulse rate: 89   Blood Pressure   BP #1: 118 / 80 mm Hg  Cuff Size: Std     Pulse Oximetry   O2 Saturation: 98% room air at rest    Problems Assessed Today:   1. CAD (ICD-414.00) with hx of STATUS PTCA (ICD-V45.82) and sx of CHRONIC STABLE ANGINA---- Continue asa, nitrates, bb, statins. Quit smoking.     2. TOBACCO ABUSE (ICD-305.1) ---------- Most of her problems are related to smoking/ tobbaco abuse. I counselled her to quit smoking and gave her precription of chantix to help her quit it.    3. CAROTID ARTERY DISEASE (ICD-433.10) and STROKE (ICD-434.91) --- Willl see neurology /  Vascular Clinic.    4. CLAUDICATION INTERMITTENT (ICD-443.9) ------- sees Vascular    5.HYPERLIPIDEMIA (ICD-272.4) --------- currently she is taking Lipitor 80, Tricor 145 mg. Check FLP.    6. Left Subclavian Stent :  with her symptoms of left arm numbness, would do a duplex USG on left subclavian; she will f/u with Dr Renato Gails at the end of month.    7.HYPERTENSION (ICD-401.9) -- continue lisinopril 10 mg daily   All non-cardiac problems referred to primary care.      Cardiology Plan:   Return to clinic in 4 month(s)           ]

## 2008-01-20 NOTE — Unmapped (Signed)
Signed by Letta Pate MA on 01/20/2008 at 15:11:46    Prescriptions:  ISOSORBIDE MONONITRATE CR 60 MG TB24 (ISOSORBIDE MONONITRATE) 1 qd  #30 x 5   Entered by: Letta Pate MA   Authorized by: Harless Litten DO   Signed by: Letta Pate MA on 01/20/2008   Method used: Telephoned to ...        RxID: [id number]  TRICOR 145 MG TABS (FENOFIBRATE) qd  #30 x 6   Entered by: Letta Pate MA   Authorized by: Harless Litten DO   Signed by: Letta Pate MA on 01/20/2008   Method used: Telephoned to ...        RxID: [id number]  FOLIC ACID 1 MG TABS (FOLIC ACID) 1 qd  #30 x 5   Entered by: Letta Pate MA   Authorized by: Harless Litten DO   Signed by: Letta Pate MA on 01/20/2008   Method used: Telephoned to ...        RxID: [id number]

## 2008-01-21 NOTE — Unmapped (Addendum)
Signed by Gershon Crane on 01/21/2008 at 14:13:17        [facility]   [address]  [place], [place] [address]   [phone]  Fax: [phone]               January 16, 2008                RE: Tracie Romero   DOB:  Jul 31, 1961      Dear Dr. Sherilyn Dacosta,    I had the pleasure of seeing your patient, Tracie Romero, at your request for surgical evaluation in our Vascular Surgery office. Please see my Assessment and Plan below as well as the attached office note.    Stable carotid and peripheral arterial exam with L internal carotid artery stenosis unchanged by Duplex. Continue ECASA. Encouraged her to call her PCP or Cardiologist regarding the chest pressure and pain she ihas been experiencing over the past few weeks.     Thank you for allowing me to see this patient.  I will keep you informed of their progress.    Best personal regards,        Yudith Norlander B. Renato Gails, MD, FACS  Associate Professor of Surgery  Division of Vascular Surgery  [email or web address]        CC  Ian Bushman, MD  Dr. Marda Stalker      Signed by Gershon Crane on 01/21/2008 at 14:16:47    faxed through EMR to Dr. Matthias Hughs

## 2008-01-21 NOTE — Unmapped (Addendum)
Signed by Gershon Crane on 01/21/2008 at 14:12:47        [facility]   [address]  [place], [place] [address]   [phone]  Fax: [phone]               January 16, 2008                RE: [name]   DOB:  1961-05-09      Dear Dr. Ian Bushman,    I had the pleasure of seeing your patient, Tracie Romero, at your request for surgical evaluation in our Vascular Surgery office. Please see my Assessment and Plan below as well as the attached office note.    Stable carotid and peripheral arterial exam with L internal carotid artery stenosis unchanged by Duplex. Continue ECASA. Encouraged her to call her PCP or Cardiologist regarding the chest pressure and pain she ihas been experiencing over the past few weeks.     Thank you for allowing me to see this patient.  I will keep you informed of their progress.    Best personal regards,        Tacora Athanas B. Renato Gails, MD, FACS  Associate Professor of Surgery  Division of Vascular Surgery  [email or web address]        CC    Jackey Loge  Dr. Marda Stalker      Signed by Gershon Crane on 01/21/2008 at 14:17:17    routed to Dr. Boone Master

## 2008-01-21 NOTE — Unmapped (Addendum)
Signed by Gershon Crane on 01/21/2008 at 14:13:47        [facility]   [address]  [place], [place] [address]   [phone]  Fax: [phone]               January 16, 2008                RE: Tracie Romero   DOB:  22-Nov-1960      Dear Dr. Marda Stalker,    I had the pleasure of seeing your patient, Tracie Romero, at your request for surgical evaluation in our Vascular Surgery office. Please see my Assessment and Plan below as well as the attached office note.    Stable carotid and peripheral arterial exam with L internal carotid artery stenosis unchanged by Duplex. Continue ECASA. Encouraged her to call her PCP or Cardiologist regarding the chest pressure and pain she ihas been experiencing over the past few weeks.     Thank you for allowing me to see this patient.  I will keep you informed of their progress.    Best personal regards,        Murna Backer B. Renato Gails, MD, FACS  Associate Professor of Surgery  Division of Vascular Surgery  [email or web address]        CC  Ian Bushman, MD  Jackey Loge        Signed by Gershon Crane on 01/21/2008 at 14:17:17    routed to Dr. Orlin Hilding

## 2008-01-21 NOTE — Unmapped (Signed)
Signed by Collier Flowers MD on 01/21/2008 at 10:10:32    [facility]  CARDIOLOGY FOLLOW-UP VISIT    Chief Complaint: CAD    History of Present Illness   Arlana M. Murfin is a 47 Years Old Female who was seen at the request of [employer].    [name] is a 47 Years Old Female who was seen at the request of Helane Rima.      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 I saw her 1 month ago, with stable symptoms of angina. She was referred back for quick f/u due to recent symptoms of dypsnea at night. She is actively walking in the stores, without any worsening of anginal symptoms. She does have claudication. She is down to 3-4 cig/ day. Denies unstable angina symptoms. Dr Phoebe Sharps increased atenolol to 50 mg daily.    She has stable symptoms of chest pain/ chronic stable angina. She is taking good medical therapy and antianginals. She has Intermediate stenosis in Mid LAD but rather diffuse disease. Her FFR accross that lesion in March at [facility] was 0.83. Her stress test in 10/08 did not show any ischemia in anterior wall    She sees Dr Renato Gails, for Carotid disease and PAD. Her rest and exercise ABI were mildly abnl for right sided disease. She also has mild to moderate b/l carotid disease and previous stent in left subclavian. She was advised to quit smoking, modify her risk factors.       Cardiology Problem List:   OTHER DISEASES OF LUNG NOT ELSEWHERE CLASSIFIED (ICD-518.89)  EMPHYSEMA (ICD-492.8)  PRINZMETAL'S ANGINA (ICD-413.1)  STATUS, PTCA (ICD-V45.82)  TOBACCO ABUSE (ICD-305.1)  STROKE (ICD-434.91)  TIA (ICD-435.9)  CLAUDICATION, INTERMITTENT (ICD-443.9)  HYPERLIPIDEMIA (ICD-272.4)  CAROTID ARTERY DISEASE (ICD-433.10)  HYPERTENSION (ICD-401.9)  DEPRESSION (ICD-311)  CAD (ICD-414.00)  HEADACHE (ICD-784.0)  SPELLS (ICD-780.39)              Previous Echocardiogram:  12/25/06-ECHO   SUMMARY   -  Overall left ventricular systolic function was normal. Left         ventricular ejection fraction was estimated to be 60 % in the         range of 55 % to 60 %. There were no left ventricular         regional wall motion abnormalities. There was an increased         relative contribution of atrial contraction to left         ventricular filling, consistent with diastolic dysfunction.      Previous Heart Catheterization/PCI:  06/28/07-  1. Left heart catheterization.   2. Selective cholangiography.   3. Left ventriculogram.   4. Selective right femoral arteriography.   5. Ascending aorta arteriography.    FINAL DIAGNOSIS:     1. Diffuse non-occlusive coronary artery disease.  Patent stent and obtuse     marginal branch and diagonal branch and mid RCA with TIMI-3 flow.    04.18.08:  POSTPROCEDURE DIAGNOSES:     1. Moderate mid left anterior descending stenosis of 50-60% with known     critical fractional flow reserve 0.83.   2. Widely patent left circumflex and first diagonal stents.   3. Preserved left ventricular function with ejection fraction of 60-65% with     normal wall motion and normal filling pressures   4.  Normal central aortic pressure 120/70.   5. Moderate left subclavian stenosis approximately 60% with 20 mm     translesional gradient.   6. Diffuse moderate aortoiliac disease without obvious renal artery stenosis.  CONCLUSIONS:     1. Moderate mid left anterior descending stenosis of 50-60% with known     critical fractional flow reserve 0.83.   2. Widely patent left circumflex and first diagonal stents.   3. Preserved left ventricular function with ejection fraction of 60-65% with     normal wall motion and normal filling pressures   4. Normal central aortic pressure 120/70.   5. Moderate left subclavian stenosis approximately 60% with 20 mm     translesional gradient.   6. Diffuse moderate aortoiliac disease without obvious renal artery  stenosis.    04.16.08  PROCEDURES PERFORMED:     1.  Aortogram with runoff.   2.  Selective renal angiography.   3.  Selective iliac artery injections.     IMPRESSION:     1.  The mild aortoiliac atherosclerosis with 30% distal aortic obstruction.   2.  A 30% obstruction of the left common iliac.   3.  A 70% obstruction at the ostium of the left renal artery.    04.09.07:  POSTOPERATIVE DIAGNOSIS(ES):     1.  Coronary arteries are free of significant fixed disease.   2.  Diffuse left circumflex and LAD spasm without chest pain relieved with        nitroglycerin.   3.  Normal left ventricular function by angiography and by end diastolic   pressure.    10.30.06:     POSTOPERATIVE DIAGNOSIS(ES):     1.  Two-vessel coronary artery disease, manifested as moderate disease of the   LAD and severe in-stent restenosis of the circumflex.   2.  Normal left ventricular systolic function.   3.  No mitral regurgitation.   4.  Successful PTCA and stent of the circumflex OM in-stent restenosis with 2   separate TAXUS drug-eluting stents.    01.09.06:  ANGIOGRAPHIC RESULTS:    1.        The circumflex is nondominant, distributing as a large OM branch and            modest posterolateral branch.  There was a mid stent distal to the            takeoff of the PL branch, which had a focal mid in-stent 60%            obstruction that was reduced to 0% with angioplasty and stenting.            There was noted to be spasm intraprocedurally completed with            nitroglycerin.      IMPRESSION:    1.        Successful revascularization to the mid circumflex artery at a site of            previous in-stent restenosis.      Previous Nuclear Study:  07/03/07-Stress Test  FINAL INTERPRETATION There is normal myocardial perfusion.  Overall study quality is excellent.   Scan significance indicates low cardiac risk.    PERFUSION FINDINGS There is normal left ventricular perfusion.   There is no evidence of visual transient dilation with a  normal stress/rest left ventricular volume ratio of 1.10.    The right ventricle is normal.  EKG DATA: Rest EKG: Sinus rhythm/Normal conduction/No arrhythmias/Normal  repolarizationStress EKG: Sinus rhythm/No arrhythmias/Normal pattern/Nondiagnostic low heart rate EKG findings (only): Nondiagnostic EKG due to inadequate heart rate    12/25/06-Stress Test  IMPRESSION:    NO EVIDENCE OF STRESS-INDUCED REVERSIBLE MYOCARDIAL ISCHEMIA.      Previous EKG/Holter/Event Monitor:  07/03/07-EKG  Ventricular Rate  67 BPM   Atrial Rate  67 BPM   P-R Interval  98 ms   QRS Duration  94 ms   QT  386 ms   QTc  407 ms   P Axis  11 degrees   R Axis  11 degrees   T Axis  1 degrees    Previous US/Vascular:  12/05/07 Test Performed: Left Subclavian Artery Stent Duplex  IMPRESSION:  Slightly increased flow velocity in the subclavian artery stent, the remaining left subclavian - brachial arteries reveal normal velocities. Normal multiphasic waveforms throughout these segments.      12/26/07 Lower Extremity Arterial Study  Impression:   Right Iliac Arterial Occlusive Disease. Left Tibial Arterial Occlusive Disease.  ABI Comment:   Left ABI normal at rest.  Right ABI in moderate claudication range.           12/25/06-Venous Duplex  MPRESSION:    1.  ESTIMATED DIAMETER REDUCTION OF THE RIGHT INTERNAL CAROTID ARTERY IS 20-49%.    2.  ESTIMATED DIAMETER REDUCTION OF THE LEFT INTERNAL CAROTID ARTERY IS AT THE LOWER LIMITS OF 50-69%.    3.  BOTH VERTEBRAL ARTERIES ARE PATENT WITH ANTEGRADE FLOW.    THIS REPRESENTS NO SIGNIFICANT CHANGE COMPARED TO THE PREVIOUS STUDY DATED 05/18/06.    11.16.08  CAROTID DUPLEX STUDY     FINDINGS:  Brachial pressure is 122 mmHg on the right and 119 mmHg on the   left.     B-mode ultrasound reveals a large amount of nonechogenic plaque in both   carotid bulbs and proximal internal carotid arteries.  Doppler flow analysis   demonstrates a peak systolic velocity of 103 cm/second with an end-diastolic   velocity of 46  cm/second and severe spectral broadening in the right internal   carotid artery.  In the left internal carotid artery the peak systolic   velocity is 175 cm/second with an end-diastolic velocity of 86 cm/second and   severe spectral broadening. There is antegrade flow in both vertebral   arteries.     IMPRESSION:     1. Moderate right internal carotid artery stenosis in the 16-49% range.   2. Severe left internal carotid artery stenosis in the 50-79% range.        Previous Radiology:  06/28/07-Chest Xray  Impression:    No evidence of acute cardiopulmonary disease    Past History  Past Medical History (reviewed - no changes required):  Coronary Artery Disease, Hyperlipidemia, Hypertension, Depression, Stroke, Transcient Ischemic Attack (TIA), hemorrhoids  anxiety  Carotid bruits  Marijuana use  Surgical History (reviewed - no changes required):  Coronary Stent: x7, 1 stent in left arm, Arthroscopic Knee Surgery: Right, TIA  Angioplasty + stent 1/03  Right hand mass excision 5/08    Family History (reviewed - no changes required): HTN, hyperlipidemia, heart disease, diabetes, cancer  GF- Lung Ca  Sister - Epilepsy  Brother - MI  Social History (reviewed - no changes required): Marital Status: married,   Children: 2,   Employment Status: disabled,   Patient Lives at: home,   Support System: excellent  Caffeine per Day: 4+  Seatbelt Use: 100 % of the time  Alcohol Use: none  Drug Use:  none  Tobacco Usage:smoker  Cigarettes-Year Started: 1969,   Cigarettes-Years smoked- 40,   Cigarettes-Packs per Day- 1 cig per week,   Pack/Years- 0    Coronary Risk Factor Assessment   Cigarette Smoker: smoker   HBP: no   Hyperlipidemia: yes     Labs:   Previous Cholesterol: 133 (12/05/2007 8:48:00 AM)  Previous LDL: 77 (12/05/2007 8:48:00 AM)  Previous HDL: 35 (12/05/2007 8:48:00 AM)  Previous Triglyceride: 103 (12/05/2007 8:48:00 AM)    Goals:     LDL Goal: <70  Diabetes: no   Family History of CAD: yes    Peripheral Vascular Disease:  yes     Labs Reviewed    Review of Systems  See scanned document dated January 21, 2008.  The Review of Systems has been reviewed and the patient advised to follow up with PCP for non-cardiac problems.      Intake   Intake Comments: Follow up    Problems  ANXIETY DISORDER, GENERALIZED (ICD-300.02)  ARM PAIN, LEFT (ICD-729.5)  OTHER DISEASES OF LUNG NOT ELSEWHERE CLASSIFIED (ICD-518.89)  EMPHYSEMA (ICD-492.8)  PRINZMETAL'S ANGINA (ICD-413.1)  STATUS, PTCA (ICD-V45.82)  TOBACCO ABUSE (ICD-305.1)  STROKE (ICD-434.91)  TIA (ICD-435.9)  CLAUDICATION, INTERMITTENT (ICD-443.9)  HYPERLIPIDEMIA (ICD-272.4)  CAROTID ARTERY DISEASE (ICD-433.10)  HYPERTENSION (ICD-401.9)  DEPRESSION (ICD-311)  CAD (ICD-414.00)  HEADACHE (ICD-784.0)  SPELLS (ICD-780.39)    Medications   PLAVIX 75 MG TABS (CLOPIDOGREL BISULFATE) 1 qd  ASPIRIN 81 MG TBEC (ASPIRIN) 1 qd  FOLIC ACID 1 MG TABS (FOLIC ACID) 1 qd  ISOSORBIDE MONONITRATE CR 60 MG TB24 (ISOSORBIDE MONONITRATE) 1 qd  LIPITOR 80 MG TABS (ATORVASTATIN CALCIUM) daily  TRICOR 145 MG TABS (FENOFIBRATE) qd  NITROGLYCERIN SUBL (NITROGLYCERIN SUBL) 0.4mg  as needed  ATENOLOL 25 MG TABS (ATENOLOL) one by mouth daily  XANAX 1 MG TABS (ALPRAZOLAM)   * CHANTIX - CONTINUING MONTH as directed    Allergies  No Known Allergies    Vital Signs   Height: 62 in.  Weight: 173.8 lbs. BMI (in-lb): 31.90  BSA (m2): 1.80  Temperature: 97.0 degrees  F (oral)  Pulse rate: 84   Blood Pressure   BP #1: 130 / 78 mm Hg  Cuff Size: Std     Pulse Oximetry   O2 Saturation: 97% room air at rest     Intake recorded by: Bunnie Pion RMA  January 21, 2008 9:38 AM      PHYSICAL EXAMINATION  Constitutional: alert, no acute distress, well hydrated, well developed, well nourished.   Skin: normal color, no rashes, no unusual bruising, warm to touch.   Head: atraumatic, normocephalic.   Eyes: pupils equal, no injection, no icterus.   Ears/Nose/Throat: external ears normal, external nose normal, hearing normal.   Neck: no  thyromegaly. b/l carotid bruits, left subclavian bruit  Chest: no deformities, no apparent respiratory distress, normal percussion, no chest wall tenderness, clear to auscultation, normal breath sounds.   Neck Veins: not distended.   Carotids: normal upstroke, no bruits.   Palpation: normal.   S1 S2: normal S1 S2.   Extra Sounds: no gallop or rub or click.   Rhythm: regular rate and rhythm.   Ectopy: without ectopy.   Bilateral Lower Extremity Edema: none.   Murmur: none.   Abdomen: nondistended, nontender, normal BS, no organomegaly, no prominent aortic pulsation.   Spine: normal mobility, no deformities.   Extremities: full joint motion, no deformities.   Neuro: cranial nerves grossly intact, non focal.   Psych: affect and mood  appropriate, normal interaction.     EKG Interpretation:   No EKG done at this visit.      Assessment and Plan     Problems Assessed Today:   1. CAD (ICD-414.00) with hx of STATUS PTCA (ICD-V45.82) and sx of CHRONIC STABLE ANGINA---- Continue asa, nitrates, bb, statins. Quit smoking. Atenolol increased by PCP. Her symptoms of angina are stable. She has mild dyspnea. Her limitation is claudication symptoms. She has had angios done last summer, and stress test in 10/08. Will continue medical therapy for now. If symptoms worsen, may consider stress test. Continue asa, plavix, ace, bb, statins.    2. TOBACCO ABUSE (ICD-305.1) ---------- Most of her problems are related to smoking/ [employer]bbaco abuse. I counselled her to quit smoking and gave her precription of chantix to help her quit it. She has finished 3 months therapy, and is still smoking 3-4 cig a day. I told her to try to quit completely, and try Chantix for another 3 months to help with it.    3. CAROTID ARTERY DISEASE (ICD-433.10)  --- Frankey Shown see neurology /  Vascular Clinic.    4. CLAUDICATION INTERMITTENT (ICD-443.9) ------- sees Vascular    5.HYPERLIPIDEMIA (ICD-272.4) --------- currently she is taking Lipitor 80, Tricor 145 mg.  LDL  acceptable, HDL low. Needs to quit smoking    6. Left Subclavian Stent :   she will f/u with Dr Renato Gails .    7.HYPERTENSION (ICD-401.9) -- continue lisinopril 10 mg daily   All non-cardiac problems referred to primary care.    DISPOSITION:    Return to clinic in 3 month(s)        CC:    Ian Bushman, MD  Helane Rima                ]

## 2008-01-21 NOTE — Unmapped (Signed)
Signed by Everitt Amber RN on 01/21/2008 at 10:05:19    Prescriptions:  CHANTIX - CONTINUING MONTH as directed  #1 x 2   Entered by: Everitt Amber RN   Authorized by: Collier Flowers MD   Signed by: Everitt Amber RN on 01/21/2008   Method used: Print then Give to Patient   RxID: [id number]    Clinical Lists Changes    Medications:  Rx of CHANTIX - CONTINUING MONTH as directed;  #1 x 2;  Signed;  Entered by: Everitt Amber RN;  Authorized by: Collier Flowers MD;  Method used: Print then Give to Patient

## 2008-01-21 NOTE — Unmapped (Signed)
Signed by Collier Flowers MD on 01/21/2008 at 00:00:00  ROS      Imported By: Betsey Amen 02/07/2008 22:05:30    _____________________________________________________________________    External Attachment:    Please see [employer] EMR for this document.

## 2008-01-22 NOTE — Unmapped (Addendum)
Signed by Marijean Heath on 01/22/2008 at 14:36:22        [facility]   [address]  [place], [place] [address]   [phone]  Fax: [phone]                 January 21, 2008                    RE: [name]   DOB:  [birth year]      Dear Dr. Helane Rima,    I had the pleasure of seeing your patient, Tracie Romero, at your request in our Cardiology office.  Attached, please find my office note and recommendations regarding this patient.    Thank you for allowing me to see this patient.  I will keep you informed of their progress.    Best personal regards.    Sincerely,          Collier Flowers, MD, [facility]  Assistant Professor of Clinical Medicine  Section of Interventional Cardiology  Division of Cardiovascular Services      CC  Ian Bushman, MD            Signed by Marijean Heath on 01/22/2008 at 14:36:52    routed to dr. Phoebe Sharps

## 2008-01-27 NOTE — Unmapped (Signed)
Signed by Harless Litten DO on 01/27/2008 at 22:54:09                January 27, 2008      Re:     Tracie Romero   DOB:  02-25-1961      To Whom It May Concern:         Tracie Romero has been a patient at our office for over 2 years.  She has been treated for anxiety and depression and has required the use of Xanax (alprazolam) as a tranquilizer due to severe anxiety.  This was last filled by the patient on 11-13-07 per my records.  As far as I know, she continues to take it three times a day as needed.  Among possible side-effects of xanax are: impaired memory, sedation, impaired coordination, poor attention, trouble speaking, trouble with balance, shortness of breath, joint aches and tremor.  If I can be of further assistance, please let me know.     Sincerely,         Tracie Romero, D.O.

## 2008-01-27 NOTE — Unmapped (Signed)
Signed by Letta Pate MA on 01/28/2008 at 08:06:05    Phone Note   Patient Call  Call back at Home Phone: [phone]  Caller: patient  Call for: dr Phoebe Sharps     Reason for Call: speak with provider  Summary of Call: pt called in and she would like to speak to the nurse. pt said that she was personal and she does not want to tell me. pls call her back at [phone]     Initial call taken by: Garnet Koyanagi,  January 27, 2008 10:33 AM      Follow-up for Phone Call   Please return her call  Follow-up by: Harless Litten DO,  January 27, 2008 11:11 AM    Additional Follow-up for Phone Call   c/p lmom waiting on call back.    Additional Follow-up by: Letta Pate MA,  January 27, 2008 1:05 PM    Additional Follow-up for Phone Call   spoke with pt states she need a letter written stating how long she was on xanax and when did you stop her on it. also stating the side affects because she have to go to court for DUI and needs documentation.   also pt. states once you took her off xanax and you perscribe something different she does not think it's helping and want a different medication.  Additional Follow-up by: Letta Pate MA,  January 27, 2008 1:16 PM    Additional Follow-up for Phone Call   Notify pt that I prepared a letter.  According to my chart she is still on Xanax.  I am not sure what other med she is referring to.  Have her make an appointment to discuss.  You can print the letter to fax or pt can pick up.  Additional Follow-up by: Harless Litten DO,  January 27, 2008 10:43 PM    spoke with pt. notified that letter ready and to make a appt. to discuss medication. ..................................................................Marland KitchenLetta Pate MA  January 28, 2008 8:06 AM

## 2008-02-03 NOTE — Unmapped (Signed)
Signed by Harless Litten DO on 02/03/2008 at 00:00:00  Privacy Notice      Imported By: Evette Doffing 02/05/2008 14:06:36    _____________________________________________________________________    External Attachment:    Please see [employer] EMR for this document.

## 2008-02-03 NOTE — Unmapped (Signed)
Signed by Harless Litten DO on 02/03/2008 at 00:00:00  Disclosure      Imported By: Evette Doffing 02/05/2008 14:06:28    _____________________________________________________________________    External Attachment:    Please see [employer] EMR for this document.

## 2008-02-10 NOTE — Unmapped (Signed)
Signed by Letta Pate MA on 02/10/2008 at 11:23:52    Prescriptions:  PLAVIX 75 MG TABS (CLOPIDOGREL BISULFATE) 1 qd  #30 x 1   Entered by: Letta Pate MA   Authorized by: Harless Litten DO   Signed by: Letta Pate MA on 02/10/2008   Method used: Telephoned to ...        RxID: [id number]

## 2008-02-19 NOTE — Unmapped (Signed)
Signed by   LinkLogic on 02/19/2008 at 09:43:36  Patient: Tracie Romero  Note: All result statuses are Final unless otherwise noted.    Tests: (1) CONSULTATIVE REPORT (CO)    Order Note: PATIENT NAME                        PA #                 MR #  Tracie Romero, Tracie Romero                  [id number]           [id number]  ATTENDING PHYSICIAN                               CONSULTATION DATE  Lynwood Dawley, DO                                02/19/2008  CONSULTING PHYSICIAN                               DISCHARGE DATE  Georgann Housekeeper, MD  DATE OF BIRTH        AGE               PATIENT TYPE          RM #  October 24, 1960           46                IPK                   [id number]    CARDIOLOGY CONSULTATION    CHIEF COMPLAINT:  Chest pain.    HISTORY OF PRESENT ILLNESS:  The patient is a 47 year old woman with a  history of known coronary disease.  She is followed at [facility].  She has a history of previous interventions with multiple stents.  She said  she has had six stents placed.  She thinks her last catheterization was one  year ago.  Yesterday she developed chest discomfort.  She felt a tightness  in her chest that radiated down her left arm.  It occurred after she had  gotten in an argument with a family member.  She took two nitroglycerin  tablets with no improvement so she came to the hospital.  She still  continues to have a low level of chest discomfort.  The pain feels very  similar to what she had had with the previous coronary disease.  She has  had some shortness of breath when she first had the pain.  She denies any  orthopnea.  She denies any lower extremity edema.    She has not had any recent chest pain.  She says the nitroglycerin did not  provide significant relief but the pain is now better.    PAST MEDICAL HISTORY:  Significant for:  1.  Coronary disease.  She has had six stents placed.  2.  History of hypertension.  3.  Hyperlipidemia.    She does not have a history of diabetes.  She has had no  other surgeries.      FAMILY HISTORY:  Positive for coronary disease.    SOCIAL HISTORY:  She says she smokes one cigarette per  day.    MEDICATIONS:  Per medication reconciliation sheet.  Specifically she is  on:  Aspirin.  Atenolol.  Chantix.  Folic acid.  Isosorbide.  Lipitor.  Lisinopril.  Plavix.  TriCor.    ALLERGIES:  NO KNOWN DRUG ALLERGIES.    REVIEW OF SYSTEMS: CONSTITUTIONAL:  No fever or chills.  CARDIOVASCULAR:  Chest pain as noted. PULMONARY:  The patient has shortness of breath with  chest pain and now it is improved. NEUROLOGICAL:  The patient has had no  syncope or dizziness.    PHYSICAL EXAMINATION:  GENERAL:  The patient is in bed.  She described a low level of chest  discomfort which she describes as a 1/10, rates as a 1/10.  VITAL SIGNS:  Blood pressure 120/70, pulse 71, temperature 97.8.  HEENT:  PERRLA.  NECK:  Supple.  There is no JVD.  There is a left carotid bruit.  CARDIOVASCULAR:  Regular rhythm, normal S1 and S2, no S3 is appreciated.  LUNGS:  Clear.  Breathing is nonlabored.  ABDOMEN:  Benign.  EXTREMITIES:  No significant edema.  SKIN:  No rashes, clubbing or cyanosis.  PSYCHIATRIC:  Affect is normal.  The patient is alert and oriented.    LABORATORY DATA:  Troponin of 0.009, potassium of 3.9, creatinine 0.8, INR  0.99, white count of 8.0, hemoglobin of 13.6.  EKG this morning shows  normal sinus rhythm, nonspecific T wave abnormalities.    IMPRESSION:  1.  Unstable angina.  2.  Chronic ischemic heart disease.  3.  Hypertension.  4.  Hyperlipidemia.  5.  Tobacco abuse.    PLAN:  1.  Heparin.  2.  Aspirin, nitrates, beta blocker.  3.  The patient should have a catheterization.  4.  Make arrangements.  5.  The patient is agreeable.                                                   Georgann Housekeeper, MD    MDA/era  D:   02/19/2008  8:14 A    T:  02/19/2008  9:28 A     Job # [id number]  CS # [id number]  cc:    Georgann Housekeeper, MD         Lynwood Dawley, DO    Confidential Patient Information:  Accompanied are [facility] results that   are being delivered by [facility].  If you receive a clinical result for a patient that is not yours please   contact [facility] at [phone].    Order Note: **SIGNED LEGAL DOCUMENT IS LOCATED IN DOCVIEW**    Note: An exclamation mark (!) indicates a result that was not dispersed into   the flowsheet.  Document Creation Date: 02/19/2008 9:43 AM  _______________________________________________________________________    (1) Order result status: Final  Collection or observation date-time: 02/19/2008  Requested date-time:   Receipt date-time:   Reported date-time:   Referring Physician: Geoffry Paradise  Ordering Physician:  Reviewed In Hospital [facility])  Specimen Source:   Source: Francesco Sor Order Number: [id number] TRANSCRIPTION  Lab site:

## 2008-02-19 NOTE — Unmapped (Signed)
Signed by   LinkLogic on 03/10/2008 at 13:15:04  Patient: Tracie Romero  Note: All result statuses are Final unless otherwise noted.    Tests: (1) DISCHARGE SUMMARY (DS)    Order Note: PATIENT NAME                        PA #                 MR #  Katlynn, Naser                  [id number]           [id number]  ATTENDING PHYSICIAN                           ADM DATE       DIS DATE  Lynwood Dawley, DO                            02/19/2008     02/19/2008  DATE OF BIRTH        AGE               PATIENT TYPE  November 09, 1960           46                IPK    DISCHARGE DIAGNOSES:  1. Unstable angina.  2. Coronary artery disease.  3. Hypertension.  4. Anxiety.  5. Carotid artery disease.  6. Hyperlipidemia.  7. Tobacco use disorder.    OPERATIONS/PROCEDURES:  None.    CONSULTATIONS:  Cardiology.    DISCHARGE DISPOSITION:  The patient was transferred to [facility] per cardiology for angiogram and further treatment.    HOSPITAL COURSE:  This 47 year old white female presented to the ER with  chest pain with radiation to the left arm after verbal confrontation with  her son.  The patient had nausea and dyspnea, but denied any diaphoresis.  She took a series of 3 nitroglycerin without relief.  She was given  morphine in the ER, which relieved the pain and the patient denies pain  since.  The patient has coronary artery disease with stents, per  cardiologist at [facility].  Her last angiogram was more than 1  year ago.  She was admitted for rule out MI protocol.  Cardiology  associates were consulted.  It was felt that the patient needed angiogram  and possibly further angioplasty and stents.  Therefore, she was  transferred to [facility] per cardiology.    LABORATORY:  Sodium 139, potassium 3.9, chloride 106, CO2 of 25, glucose  99, BUN 10, creatinine 0.8.  Liver function tests were normal.  Troponins  were negative x3.  Calcium level 9.7.  CBC showed a white count of 8,  hemoglobin  13.6, hematocrit 40.5, platelet count 362.  PTT 27.2.  Chest  x-ray showed no acute pulmonary process.                                                 Harless Litten, DO    GAN/sst  D:  03/05/2008  8:05 A     T:  03/10/2008 12:54 P     Job #  [id number]  CS # [id number]  cc:    Harless Litten, DO         Lynwood Dawley, DO    Confidential Patient Information: Accompanied are [facility] results that   are being delivered by [facility].  If you receive a clinical result for a patient that is not yours please   contact [facility] at [phone].    Order Note: **SIGNED LEGAL DOCUMENT IS LOCATED IN DOCVIEW**    Note: An exclamation mark (!) indicates a result that was not dispersed into   the flowsheet.  Document Creation Date: 03/10/2008 1:15 PM  _______________________________________________________________________    (1) Order result status: Final  Collection or observation date-time: 02/19/2008  Requested date-time:   Receipt date-time:   Reported date-time:   Referring Physician: Geoffry Paradise  Ordering Physician:  Reviewed In Hospital [facility])  Specimen Source:   Source: Francesco Sor Order Number: [id number] TRANSCRIPTION  Lab site:

## 2008-02-19 NOTE — Unmapped (Signed)
Signed by   LinkLogic on 02/20/2008 at 16:02:33  Patient: Tracie Romero  Note: All result statuses are Final unless otherwise noted.    Tests: (1) 81025 Pregnancy Test UrinePoct [id number] ... more (PREGU)    Order Note:     Order Note: Site: [facility] CARDIAC CATH LAB  [phone]  Unit #: [id number]  Location: GS11AB  Account #: [id number]  Req #: [id number]; [id number]  Order #: Gwynn Burly; [id number];   [id number]; [id number]  Procedure: [id number] Pregnancy Test UrinePoct C413750; Diagnostic Cath Generic   Order [id number]; [id number] Pre/Post Proc Care/Hr [id number]; Newt.Amor Iliac Art w/Cath   [id number]; 70350 Abdominal Aortogram S    Order Note:         INDICATION:  Unstable angina, severe intermittent claudication.        OPERATOR:  Marcelline Mates, M.D.        PROCEDURE:  Left heart catheterization, left ventriculography, coronary   angiography, abdominal aortography.        The patient was prepped and draped supine on the angiography table.    Fentanyl and Versed were administered for conscious sedation.  2% Lidocaine   was instilled into the right common femoral artery  region for local   anesthesia.  Pulse is very faint in the right lower extremity.  With much   difficulty I was able to get a Wholey wire started in the right common femoral   artery.  The wire buckled  immediately.  I was able to place the tip of a 4   [place] dilator into the right common femoral artery, withdrew the [facility] wire   and I was able to aspirate a little bit of blood.  I did an injection and    showed that iliac artery was occluded.  I was able to advance the [facility] wire   back into the dilator which made progression within the iliac distribution.    At this point I decided to do a sheathless  catheterization with the right   coronary artery catheter which was a 3DRC.  I was able to advance it up into   the aorta, advance the [facility] wire up through the catheter and confirmed that   we were indeed   intraluminal.  We did an injection and checked pressures at   that time.  We then advanced a 4 French sheath into the right common femoral   artery.  We then did our standard catheterization with a JL4,  AL2 catheter   for the RCA and a pigtail catheter.  We then brought the pigtail catheter down   into the abdominal aorta and we did abdominal aortography and iliac   angiography which revealed that the  right external iliac artery was totally   occluded.  My sheath was at the distal portion of the lesion, therefore, I   felt that I could not adequately dilate the vessel.  Therefore, I withdrew all    equipment from the body.  The patient was brought to the post cath holding   area in stable condition for sheath removal per protocol.        FINDING:  This is a right dominant coronary arterial system.        LEFT MAIN CORONARY ARTERY:  The LMCA has minimal luminal irregularities.        LEFT ANTERIOR DESCENDING:  The LAD has a mid 50-70% lesion. The first   diagonal artery has a patent stent that has approximately 10-20% in-stent   restenosis.  LEFT CIRCUMFLEX:  The left circumflex coronary artery has minimal luminal   irregularities throughout.  There is an OM3 stent which was widely patent.  At   the distal end of the OM3 stent there is a  50-70% lesion.        RIGHT CORONARY ARTERY:  The RCA has a widely patent mid stent.  It does   have an anterior and downward take-off.  There are minimal luminal   irregularities throughout its course.  It is the  dominant vessel.        LEFT VENTRICULOGRAM:  Reveals normal LV systolic function, estimated   ejection fraction of 60-65%.  No regional wall motion abnormalities are noted.        PRESSURES:  LVEDP is 15 mmHg.  The aortic pressure was 100/60 mmHg.        ABDOMINAL AORTOGRAM:  The abdominal aorta reveals luminal irregularities   throughout.  There is approximately a 20-30% distal aortic lesion.  There are   single patent bilateral renal arteries.  The  left common  iliac artery has   approximately a 70% proximal lesion.  The right common iliac artery has   approximately a 50% proximal lesion.  The right external iliac artery is   occluded and reconstitutes  at the level of the common femoral artery.  The   left external iliac artery and left common femoral artery are widely patent.        IMPRESSION:     Three vessel coronary artery disease.     Patent D1 stent.     Patent OM3 stent.     Patent RCA stent.     Normal LV function.     Normal LVEDP.     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     Occluded right external iliac artery.        PLAN:  We will check a SPECT cardiac scan to assess the LAD and circumflex   distribution to see if either of these show physiologic ischemia.  We will   check the ABIs.  We will bring her back most  likely on Friday and access from   the left common femoral area to see the full extent of disease in the right   external iliac artery since a sheath will not be present at that location   during that  angiography to see if it is amenable to intervention.  In the   interim we will continue her medication regimen.              Dictated by:  Judieth Keens M.D.  D:  02/19/08 0000  T:  02/20/08 1425   KK    This document is confidential medical information.  Unauthorized disclosure or   use of this information is prohibited by law.  If you are not the intended   recipient of this document, please advise [place] by calling immediately   [phone].    Note: An exclamation mark (!) indicates a result that was not dispersed into   the flowsheet.  Document Creation Date: 02/20/2008 4:02 PM  _______________________________________________________________________    (1) Order result status: Final  Collection or observation date-time: 02/19/2008  Requested date-time: 02/20/2008 14:25  Receipt date-time: 02/19/2008 00:01  Reported date-time:   Referring Physician:    Ordering  Physician:   ([facility] Reviewed In Hospital)  Specimen Source:   Source:  HBTXN  Filler Order Number: [id number] TRANSCRIPTION  Lab site: TCSV

## 2008-02-19 NOTE — Unmapped (Signed)
Signed by   LinkLogic on 02/19/2008 at 14:29:26  Patient: Tracie Romero  Note: All result statuses are Final unless otherwise noted.    Tests: (1) ER ADMIT (EA)    Order Note:     Order Note: **SIGNED LEGAL DOCUMENT IS LOCATED IN DOCVIEW**    Order Note: PATIENT NAME                        PA #                 MR #  Tracie Romero, Tracie Romero                  [id number]           [id number]  ATTENDING PHYSICIAN                   ADMIT DATE          DIS DATE  Lynwood Dawley, DO                    02/19/2008          02/19/2008  DATE OF BIRTH        AGE               PATIENT TYPE          RM #  [birth year]           46                IPK                   [id number]    REASON FOR VISIT:  Chest pain.    HISTORY OF PRESENT ILLNESS: The patient is a 47 year old female who  presents to the emergency department with chest pain.  It has been going on  since about 9:30. It has been intermittent, midsternal.  It does go to her  left arm and to the left side of her neck.  She states she was short of  breath.  That has resolved.  No diaphoresis, nausea.  No vomiting, no  headaches, no back pain, no abdominal pain.  No leg pain or swelling.  No  rashes.  She has a history of coronary artery disease with a stent placed  last in 2007.    PAST MEDICAL HISTORY:  Coronary artery disease, hypertension, depression.    MEDICATIONS:  Plavix.  Lisinopril.  Lipitor.  Tricor.  Folic acid.  Aspirin.    ALLERGIES:  NO KNOWN DRUG ALLERGIES.    SOCIAL HISTORY:  The patient does smoke, does not drink.    FAMILY HISTORY:  Noncontributory.    REVIEW OF SYSTEMS:  As above.  CONSTITUTIONAL:  No fevers or chills.  ENT:  No headaches, ear, nose, or throat pain.  Denies vision complaints.  NECK:  No meninges.  CHEST:  There is some chest pain that is a 10/10.  LUNGS:  No  cough or congestion, but does have shortness of breath.  ABDOMEN:  No  anorexia.  Has nausea, no vomiting.  No incontinence of bowel or bladder.  EXTREMITIES:  No myalgias, no DVTs.   HEMATOLOGY/ONCOLOGY:  No history of  easy bleeding or bruising or cancer.  NEUROLOGIC:  No seizures or TIAs.  ENDOCRINE:  No increased thirst, ____                .  SKIN:  No  rashes.    PHYSICAL EXAMINATION:  VITAL SIGNS:  Temperature is 98.5, pulse 93, respirations 22, blood  pressure 192/100.  GENERAL:  She is awake and alert and appears in no obvious distress.  HEENT:  Normocephalic, atraumatic.  Ears, nose, and throat are clear.  Pupils are equal and reactive.  NECK:  Supple.  HEART:  Regular.  LUNGS:  Clear.  ABDOMEN:  Soft, nontender, nondistended.  Positive bowel sounds.  EXTREMITIES:  No clubbing or cyanosis.  SKIN:  No rashes.    LABORATORY DATA:  Chest x-ray with no acute disease.  EKG was normal sinus  rhythm.  No acute abnormalities appreciated. Hemoglobin is 13.6 and  hematocrit 44.5.  Platelets are 362.  Electrolytes are unremarkable.  INR  is 0.99.  Troponin 0.009.    ASSESSMENT/PLAN:  This is a 47 year old with chest pain. She already took 3  nitro prior to arrival with no relief.  I gave her 4 more ____  .  I did give her 4 baby aspirin and discussed with Dr. Kandice Hams.  We are going  to bring her into the hospital.  She had a stress test, she believes, and  followed up at [facility] and she is fairly for her symptoms here.  We are going to bring her in for further workup.  She has an atypical  presentation.                                                    Burr Medico, MD    FF/mg  D:  02/19/2008  2:37 A     T:  02/19/2008  6:13 A     Job # [id number]  CS # [id number]  cc:    Lynwood Dawley, DO    Confidential Patient Information: Accompanied are [facility] results that   are being delivered by [facility].   If you receive a clinical result for a patient that is not yours please   contact [facility] at [phone].    Note: An exclamation mark (!) indicates a result that was not dispersed into   the flowsheet.  Document Creation Date: 02/19/2008 2:29  PM  _______________________________________________________________________    (1) Order result status: Corrected  Collection or observation date-time: 02/19/2008  Requested date-time:   Receipt date-time:   Reported date-time:   Referring Physician: Geoffry Paradise  Ordering Physician:  Reviewed In Hospital [facility])  Specimen Source:   Source: Francesco Sor Order Number: [id number] TRANSCRIPTION  Lab site:

## 2008-02-20 NOTE — Unmapped (Signed)
Signed by   LinkLogic on 02/20/2008 at 08:18:49  Patient: Tracie Romero  Note: All result statuses are Final unless otherwise noted.    Tests: (1) DISCHARGE MEDICATION REPORT (DM)    Order Note: Discharge Medications Report  [facility]  Discharged on 02/19/2008    ALLERGIES:  NKA,     DISCHARGE MEDICATIONS:  Aspirin (Aspirin), 325 mg Oral Daily,   Atenolol (Atenolol), 25 mg Oral Daily,   Chantix (Varenicline), 1 mg Oral 2 times per day,   Folic Acid (Folic Acid), 1 mg Oral Daily,   Isosorbide Mononitrate (Isosorbide Mononitrate), 30 mg Oral Daily,   Lipitor (Atorvastatin), 80 mg Oral Daily,   Lisinopril (Lisinopril), 40 mg Oral Daily,   Plavix (Clopidogrel), 75 mg Oral Daily,   Tricor (Fenofibrate Nanocrystallized), 145 mg Oral Daily,       Confidential Patient Information: Accompanied are [facility] results that   are being delivered by [facility].  If you receive a clinical result for a patient that is not yours please   contact [facility] at [phone].    Order Note: **SIGNED LEGAL DOCUMENT IS LOCATED IN DOCVIEW**   **THIS MAY NOT BE THE FINAL REPORT**    Note: An exclamation mark (!) indicates a result that was not dispersed into   the flowsheet.  Document Creation Date: 02/20/2008 8:18 AM  _______________________________________________________________________    (1) Order result status: Final  Collection or observation date-time:   Requested date-time:   Receipt date-time:   Reported date-time:   Referring Physician:    Ordering Physician:  Reviewed In Hospital [facility])  Specimen Source:   Source: Francesco Sor Order Number: [id number] TRANSCRIPTION  Lab site:

## 2008-02-20 NOTE — Unmapped (Signed)
Signed by   LinkLogic on 02/21/2008 at 13:38:16  Patient: Tracie Romero  Note: All result statuses are Final unless otherwise noted.    Tests: (1) Stress Card/Pharm Card Gen Ord [id number] ... more (SC)    Order Note:     Order Note: Site: [facility] CARDIOLOGY  [phone]  Unit #: [id number]  Location: GS11E  Account #: [id number]  Req #: [id number]; [id number]  Order #: [id number]; [id number]  Procedure: Stress Card/Pharm Card Royston Cowper [id number]; [phone] Pharmacologic   StressTest [id number]  Exam Date/Time: 02/20/08 0001  Admitting Diagnosis: CHEST PAIN, CAD 414.01  Reason for exam:           Order Note:         INDICATION:  Chest pain, coronary disease.        RESTING ELECTROCARDIOGRAM:  Normal sinus rhythm.   Normal EKG.        RESULTS:  The patient received a total of 44 mg of intravenous Adenosine on   a weight adjusted protocol with a rise in heart rate from 81 to 97 and a fall   in blood pressure from 142/78 to 137/63.  There is no change in the   electrocardiogram.  Myoview was injected and scanning begun thereafter.        IMPRESSION:  Nondiagnostic and unremarkable clinical electrocardiographic   response to intravenous Adenosine.  Simultaneous myocardial perfusion imaging   results are reported elsewhere.             Dictated by:  Dineen Kid Reed M.D.  D:  02/21/08   T:  02/21/08 1159   FB    This document is confidential medical information.  Unauthorized disclosure or   use of this information is prohibited by law.  If you are not the intended   recipient of this document, please advise [place] by calling immediately   [phone].    Note: An exclamation mark (!) indicates a result that was not dispersed into   the flowsheet.  Document Creation Date: 02/21/2008 1:38 PM  _______________________________________________________________________    (1) Order result status: Final  Collection or observation date-time: 02/20/2008  Requested date-time: 02/21/2008 11:59  Receipt date-time: 02/20/2008  00:01  Reported date-time:   Referring Physician:    Ordering Physician:   [facility] Reviewed In Hospital)  Specimen Source:   Source: Francesco Sor Order Number: [id number] TRANSCRIPTION  Lab site: TCSV

## 2008-02-21 NOTE — Unmapped (Signed)
Signed by Harless Litten DO on 02/21/2008 at 16:12:48    Past History  Surgical History:  Coronary Stent: x7, 1 stent in left arm, Arthroscopic Knee Surgery: Right, Iliac Stent: Right 02-21-08, TIA  Angioplasty + stent 1/03  Right hand mass excision 5/08

## 2008-02-21 NOTE — Unmapped (Signed)
Signed by   LinkLogic on 02/21/2008 at 15:32:54  Patient: Tracie Romero  Note: All result statuses are Final unless otherwise noted.    Tests: (1) [id number] Lab-ACT [id number] ... more (ACT)    Order Note:     Order Note: Site: [facility] CARDIAC CATH LAB  [phone]  Unit #: [id number]  Location: GS11E  Account #: [id number]  Req #: [id number]; [id number]  Order #: [id number]; [id number]; [id number];   [id number]; [id number]; [id number] Few;   [id number] Dryer; [id number]; [id number];   [id number]; [id number]  Procedure: 24235 Lab-ACT 3614431; 54008 Mod Sedation Ea Addl 37m 6761950;   99144 Mod Sedation Initial 29m [id number]; 36246 Rt Art 2 Init-Abd,Pel,LE   9326712; 75960 Intro Vasc Stent S    Order Note:         INDICATION:  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   It  should be noted that at the time   of the coronary angiography we were actually able to access the right groin. I   was actually able to advance a Terumo glidewire through the occluded segment   into the  aorta, and thus I brought her back in a staged fashion to re-examine   both the iliac vessels as well as both lower extremities in case surgical   correction was the procedure of choice.       OPERATOR:  Marcelline Mates, M.D.        PROCEDURE:  Abdominal aortography, bilateral lower extremity run-off,   selective right iliac angiography, selective right femoral angiography with   additional unilateral lower extremity run-off  with the catheter selectively   in the right femoral artery.  This is all from a contralateral approach via   left common femoral arterial access.  Successful PTA and stenting of the right   external iliac  artery from a contralateral approach with a 9 x 6 and 9 x 3   Zilver self expanding stents  placed in an overlapping fashion.        The patient was prepped and draped supine on the angiography table.   Fentanyl and Versed were administered for conscious sedation. 2% Lidocaine was   instilled into the left common femoral artery  region for local anesthesia.    Via modified Seldinger technique a 5 French short sheath was advanced into the   left common femoral artery.  Next we advanced a 4 [place] Omni-Flush catheter   to the  aortoiliac bifurcation.  Orthogonal views of the iliac arteries as   well as bilateral lower extremity bolus chase run-off was performed.  We were   able to advance a Supra-Core wire through the  Omni-Flush catheter into the   iliac vasculature.  We removed the Omni-Flush catheter and the 5 French short   sheath and bring up a 6 [place] Terumo 45 cm Destination sheath.  We were able   to advance it  to the distal aspect of the right common iliac artery just   above the occluded right external iliac artery.  Next, we gave a total of   7,000 units of Heparin and ACT was verified at greater then 250  seconds.    Using an angled glide catheter and an angled Terumo glidewire I was able to   advance it through the occluded segment into the common femoral artery.  I   then advanced the angled glide  catheter  into the superficial femoral artery   and removed the wire, aspirated blood injected through the catheter and   verified that we were in deed intraluminal.  We then brought down a 300 cm   Supra-Core wire  and removed the angled glide catheter. I then brought down a   5 x 4 Opta Pro balloon and did inflations at up to 7 atmospheres of pressure   throughout the iliac vasculature.  At this point we brought  down a 9 x 6   Zilver self expanding stent and placed this in the proximal portion of the   external iliac artery making sure not to jail the internal iliac artery.    There was a distal dissection plane  noted, so therefore we brought down a 9 x   3 Zilver stent and overlapped it with a  previously placed stent.  We then   performed post stent dilatation with an 8 x 4 Opta Pro balloon at up to 5    atmospheres of pressure throughout the stented segment.  There was a proximal   edge dissection which was non-flow limiting.  We did not address this since   once again it was non-flow limiting.  We  switched out the long 6 [place]   Terumo Destination sheath for a short 6 [place] sheath.  I had walked into the   control room to do my report.  The patient then started complaining of   cramping in the  right lower extremity which she rated as severe.  Therefore,   I attempted to bring the Omni-Flush catheter back up through the short 6   French sheath which was still in place since the patient was still  on the   table.  I was unsuccessful.  Therefore, I switched out to a RIM catheter and I   was able to get the RIM catheter over the arch using a Wholey wire and   verified that the iliac stent was still  widely patent.  We then advanced the   RIM catheter over the [facility] wire down to the SFA and I did run-off all the   way down to the level of the foot.  There was sluggish flow which did improve   with some  NTG, thus I felt that it was due to vasospasm.  I did not see any   evidence of embolization or vascular cut-off.  After administration of NTG and   contrast the patient's symptoms resolved, therefore the  equipment was   removed from the body.  A hematoma was then noted within the left common   femoral arterial region.  At this point the technician who was applying some   manual pressure noted that there was  some pulsatile blood coming from around   the sheath site.  It was uncertain whether it could possibly be a hole in the   sheath or a kink in the sheath.  Thus we were able to switch this out to a new   6  [place] Cordis monitoring sheath.  We did administer a gram of Ancef.  The   patient was asymptomatic at the time of leaving the catheterization table.        FINDINGS:     The left common iliac  artery has a 50-70% lesion in its proximal-mid   portion. There is ulcerated plaque noted.  The right common iliac artery has   30-40% stenosis.    The right external  iliac artery is 100% occluded.  The   left external iliac artery has mild diffuse disease.     The internal iliacs are widely patent bilaterally.     The common femoral arteries are patent bilaterally.     The superficial femoral arteries are patent bilaterally.     The popliteal arteries are patent bilaterally.     There is three vessel run-off bilaterally.        IMPRESSION:     Successful PTA and stenting of the right external iliac artery with a 9 x 6   and 9 x 3 Zilver self-expanding stents placed in overlapping fashion.        PLAN:     Due to the spasm in the lower extremity we will place the patient in ICU   for further observation.     Aspirin and Plavix therapy will be continued.                    Dictated by:  Judieth Keens M.D.  D:  02/21/08 0000  T:  02/21/08 1459   KK    This document is confidential medical information.  Unauthorized disclosure or   use of this information is prohibited by law.  If you are not the intended   recipient of this document, please advise [place] by calling immediately   [phone].    Note: An exclamation mark (!) indicates a result that was not dispersed into   the flowsheet.  Document Creation Date: 02/21/2008 3:32 PM  _______________________________________________________________________    (1) Order result status: Final  Collection or observation date-time: 02/21/2008  Requested date-time: 02/21/2008 14:59  Receipt date-time: 02/21/2008 00:01  Reported date-time:   Referring Physician:    Ordering Physician:   [facility] Reviewed In Hospital)  Specimen Source:   Source: Francesco Sor Order Number: [id number] TRANSCRIPTION  Lab site: [facility]

## 2008-02-25 NOTE — Unmapped (Signed)
Signed by Letta Pate MA on 02/25/2008 at 16:25:16    Phone Note   Patient Call  Call back at Home Phone: [phone]  Caller: patient  Call for: [facility]    Pharmacy Information: Rushie Chestnut [phone]   Summary of Call: PT WOULD LIKE REFILLS ON ALL HER MEDICATIONS EXCEPT FOR HER PLAVIX. PT SAID SHE WAS IN THE HOSP AND WAS JUST RELEASED ON SATURDAY. IN THE HOSP SHE MENTIONED TO DR.Robi Dewolfe THAT BUSPIRONE 15MG  IS NOT WORKING FOR HER. SHE WOULD LIKE TO KNOW IF SHE CAN TRY A DIFFERENT NERVE MEDICATION? PLS CALL PT BACK AT [phone]. THANKS   Current Allergies:   NKA      Initial call taken by: Elenora Gamma,  Feb 25, 2008 3:39 PM      New Medications:  XANAX 1 MG TABS (ALPRAZOLAM) 1 by mouth every 8 hrs as needed nerves.    Follow-up for Phone Call   Rx Xanax  1mg   #42  1 by mouth every 8 hrs as needed nerves.  Follow-up by: Harless Litten DO,  Feb 25, 2008 4:12 PM    Additional Follow-up for Phone Call   faxed rx in emr to [facility] pt. notified.  Additional Follow-up by: Letta Pate MA,  Feb 25, 2008 4:25 PM    Prescriptions:  Prudy Feeler 1 MG TABS (ALPRAZOLAM) 1 by mouth every 8 hrs as needed nerves.  #42 x 0   Entered by: Letta Pate MA   Authorized by: Harless Litten DO   Signed by: Letta Pate MA on 02/25/2008   Method used: Faxed to ...     [facility]     [address]     [place], [place]  [address]     Ph: [phone]     Fax: [phone]   RxID: [id number]  ATENOLOL 25 MG TABS (ATENOLOL) one by mouth daily  #30 x 0   Entered by: Letta Pate MA   Authorized by: Harless Litten DO   Signed by: Letta Pate MA on 02/25/2008   Method used: Faxed to ...     [facility]     [address]     [place], [place]  [address]     Ph: [phone]     Fax: [phone]   RxID: [id number]  NITROGLYCERIN SUBL (NITROGLYCERIN SUBL) 0.4mg  as needed  #20 x 0   Entered by: Letta Pate MA   Authorized by: Harless Litten DO   Signed  by: Letta Pate MA on 02/25/2008   Method used: Faxed to ...     [facility]     [address]     [place], [place]  [address]     Ph: [phone]     Fax: [phone]   RxID: [id number]  TRICOR 145 MG TABS (FENOFIBRATE) qd  #30 x 0   Entered by: Letta Pate MA   Authorized by: Harless Litten DO   Signed by: Letta Pate MA on 02/25/2008   Method used: Faxed to ...     [facility]     [address]     [place], [place]  [address]     Ph: [phone]     Fax: [phone]   RxID: [id number]  LIPITOR 80 MG TABS (ATORVASTATIN CALCIUM) daily  #30 x 0   Entered by: Letta Pate MA   Authorized by: Harless Litten DO  Signed by: Letta Pate MA on 02/25/2008   Method used: Faxed to ...     [facility]     [address]     [place], [place]  [address]     Ph: [phone]     Fax: [phone]   RxID: [id number]  ISOSORBIDE MONONITRATE CR 60 MG TB24 (ISOSORBIDE MONONITRATE) 1 qd  #30 x 0   Entered by: Letta Pate MA   Authorized by: Harless Litten DO   Signed by: Letta Pate MA on 02/25/2008   Method used: Faxed to ...     [facility]     [address]     [place], [place]  [address]     Ph: [phone]     Fax: [phone]   RxID: [id number]  FOLIC ACID 1 MG TABS (FOLIC ACID) 1 qd  #30 x 0   Entered by: Letta Pate MA   Authorized by: Harless Litten DO   Signed by: Letta Pate MA on 02/25/2008   Method used: Faxed to ...     [facility]     [address]     [place], [place]  [address]     Ph: [phone]     Fax: [phone]   RxID: [id number]

## 2008-02-26 NOTE — Unmapped (Signed)
Signed by   LinkLogic on 02/27/2008 at 08:58:51  Patient: Tracie Romero  Note: All result statuses are Final unless otherwise noted.    Tests: (1)  (History and Physical Report)    Order Note:     REASON:  Unstable angina.    HISTORY:  This is a 47 year old woman who has a history of chronic ischemic   heart disease  status post  multivessel PCI and recently iliac stent.  Today she has the   onset of chest  discomfort with nausea, vomiting and shortness of breath.  She presented to   the emergency  department where the pain was gradually relieved and she is uncomfortable.    REVIEW OF SYSTEMS:  She reports lightheadedness, dizziness, chest pain,   shortness of  breath, syncope, presyncope, abdominal pain, nausea , vomiting or edema.    Review of  systems otherwise negative.    PAST MEDICAL HISTORY:   Notable for multivessel coronary stenting, peripheral   vascular  disease recent status post  iliac stenting of the right iliac artery.  There   is also a  history of hyperlipidemia and hypertension with no known diabetes.    SOCIAL HISTORY:  She smokes at the present time.    FAMILY HISTORY:   Noncontributory.    MEDICATIONS:  Per medication reconciliation.    PHYSICAL EXAMINATION:  She is in no apparent distress.    VITAL SIGNS:  Stable.    JVP is 8 cm.  Carotids are without bruits.    LUNGS:  Clear to auscultation.    CARDIAC EXAM:  PMI is not displaced.  Normal S2 and S2.  No murmurs, rubs or   gallops.    ABDOMEN:  Benign and nontender without bruits.    EXTREMITIES:  Without edema.    SKIN:  Warm and dry.    NEUROLOGICAL EXAMINATION:  She is alert and oriented x 3.  Neurological exam   is otherwise  nonfocal.    EKG shows normal sinus rhythm -------, nonspecific ST wave changes.    Laboratory values  are notable for initial CPK and troponin within normal limits.    IMPRESSION:  1.  Unstable angina.  2.  Chronic ischemic heart disease status post coronary stenting.  3.  Peripheral vascular disease status post  iliac stent last week.  4.  Hypertension, hyperlipidemia.  No diabetes.  There is a history of tobacco   use.  5.  EKG with nonspecific ST-T wave changes.  Initial CK and troponin negative   x 1.    PLAN:  1.  Admit to telemetry.  2.  Check CPKs.  3.  Review films. Will consider LAD and will circumference PCI based on review   of the  films and findings above.            Order Note: Site: [facility]  [phone]  Admission Date/Time: 02/26/08   Discharge Date/Time: //   Unit #: [id number]  Location: GS11CD  Account #: [id number]  Primary Insurance: [employer] PREFERRED  Admitting Diagnosis: CHEST PAIN 786.50  Report #: [id number]      Order Note: _________________________________  Signed by:    Philomena Course. Sukin M.D.      Virgel Bouquet  D:  02/26/08 1603  T:  02/27/08 4742    This document is confidential medical information.  Unauthorized disclosure or   use of this information is prohibited by law.  If you are not the intended   recipient of  this document, please advise [place] by calling immediately   [phone].    Note: An exclamation mark (!) indicates a result that was not dispersed into   the flowsheet.  Document Creation Date: 02/27/2008 8:58 AM  _______________________________________________________________________    (1) Order result status: Final  Collection or observation date-time: 02/26/2008 16:03  Requested date-time: 02/27/2008 08:23  Receipt date-time:   Reported date-time:   Referring Physician:    Ordering Physician:   [facility] Reviewed In Hospital)  Specimen Source:   Source: Francesco Sor Order Number: [id number] TRANSCRIPTION  Lab site: [facility]

## 2008-02-26 NOTE — Unmapped (Signed)
Signed by   LinkLogic on 02/26/2008 at 19:39:53  Patient: Tracie Romero  Note: All result statuses are Final unless otherwise noted.    Tests: (1)  (Emergency Room Report)    Order Note:     CHIEF COMPLAINT:  Chest pain.    HISTORY OF THE PRESENT ILLNESS:  This is a 47 year old female who presents   today with  chest pain that began around 8:00 or 8:30 this morning, described as a severe   pain,  indigestion, there was shortness of breath, diaphoresis, nausea and vomiting.    In fact,  she said she kind of collapsed to the floor without totally losing   consciousness and had  a hard time getting up from this.  This is different than her pain that she   has had in  the past.  She has a history of coronary artery disease and has had multiple   stents  placed, including the left circumflex, diagonal, obtuse marginal, left   subclavian and her  right coronary artery.  In fact, she just was admitted to the hospital with   chest pain  about a week ago.  She was found to have a totally occluded right external   iliac artery  which was stented by Dr. Daphine Deutscher.  During that she also had an angiogram done   showing 50  to 70% LAD lesion, 50 to 70% obtuse marginal lesion and 10 to 20% in-stent   stenosis of  the first diag.  She had a normal Myoview during that stay as well.  The   patient presents  now pain-free after taking three nitroglycerin but still is complaining of   nausea.    She has had in the past had stents done by Dr. Orlin Hilding of [facility] and Dr.   Marita Snellen from  [facility] who angiographed her as well.    PAST MEDICAL HISTORY:  Hypertension, hypercholesterolemia, carotid stenosis,   coronary  artery disease, peripheral vascular occlusive disease.    MEDICATIONS:  Plavix, TriCor, Lipitor, folic acid, atenolol, isosorbide,   lisinopril,  Aspirin, BuSpar and nitroglycerin.    ALLERGIES:  No known drug allergies.    SOCIAL HISTORY: Smokes one to two cigarettes a day.  Denies alcohol.    REVIEW OF SYSTEMS:  No fever, no  rash, no rhinorrhea, sore throat or cough.    She has  chest pain and shortness of breath.  No abdominal pain.  She has nausea but no   vomiting,  no diarrhea.  She has been having leg pain for a prolonged period of time.    She is not  diabetic.  The remainder review of systems is negative.    PHYSICAL EXAMINATION:  VITAL SIGNS:  Temperature is 97.8, blood pressure 130/92, pulse 87,   respirations 18,  pulse ox 100% on room air.    GENERAL:  In general is a well-developed and well-nourished 47 year old   female, alert,  oriented to person, place and time, no acute distress.    SKIN: The skin is warm and dry without rashes.    HEENT: The pupils equal, round.  Extraocular muscles were intact.  Nose is   clear.  The  mucous membranes are moist.  No posterior erythema.    NECK: The neck was supple.  There is no adenopathy.    HEART: Regular rate and rhythm without murmurs, rubs or gallops.    LUNGS: Equal breath sounds bilaterally; no wheezes, rhonchi or rales.    ABDOMEN:  The abdomen is soft, nondistended, nontender to the touch.  No flank   tenderness.    EXTREMITIES:  No cyanosis, clubbing and no peripheral edema.  Dorsalis pedis   pulses and  radial pulses are palpable bilaterally.    MEDICAL DECISION MAKING:  The patient presents with chest pain.  She has known   history of  coronary artery disease with moderate blockage currently on a recent   angiogram.  Certainly this could be an ischemic problem and somewhat different, I suppose   could be  gastrointestinal as well.  The patient had already taken Aspirin at home as   well as  nitroglycerin and is now pain free and, therefore, she had only paste applied.    Her EKG showed normal sinus rhythm, rate 81 with no ST elevation or   depressions.  Her  white count is 8.4, hemoglobin 11.8, platelets 474.  BUN 11, creatinine 0.7,   glucose 113.  CPK was 66, CPK MB 1.4, troponin less than 0.01.  Chest x-ray was negative.    Discussed the case with Dr. Rusty Aus.  Will make  arrangement for admission to the   hospital  for a rule out period.      IMPRESSION:  Acute chest pain.    DISPOSITION:  To be admitted.                  Order Note: Site: [facility]  [phone]  Admission Date/Time: 02/26/08   Discharge Date/Time: //   Unit #: [id number]  Location: GS11CD  Account #: [id number]  Primary Insurance: [employer] PREFERRED  Admitting Diagnosis: CHEST PAIN 786.50  Report #: [id number]      Order Note: _________________________________  Signed byMarcelino Duster M.D. Wyline Beady  D:  02/26/08 1738  T:  02/26/08 1828    This document is confidential medical information.  Unauthorized disclosure or   use of this information is prohibited by law.  If you are not the intended   recipient of this document, please advise [place] by calling immediately   [phone].    Note: An exclamation mark (!) indicates a result that was not dispersed into   the flowsheet.  Document Creation Date: 02/26/2008 7:39 PM  _______________________________________________________________________    (1) Order result status: Final  Collection or observation date-time: 02/26/2008 17:38  Requested date-time: 02/26/2008 18:28  Receipt date-time:   Reported date-time:   Referring Physician:    Ordering Physician:   [facility] Reviewed In Hospital)  Specimen Source:   Source: Francesco Sor Order Number: [id number] TRANSCRIPTION  Lab site: [facility]

## 2008-02-27 NOTE — Unmapped (Signed)
Signed by   LinkLogic on 02/27/2008 at 19:34:06  Patient: Tracie Romero  Note: All result statuses are Final unless otherwise noted.    Tests: (1) Interventional CathGeneric Ord [id number] (CI)    Order Note:     Order Note: Site: [facility] CARDIAC CATH LAB  [phone]  Unit #: [id number]  Location: [facility]11[facility]  Account #: [id number]  Req #: [id number]  Order #: Lincoln Brigham  Procedure: Interventional CathGeneric Ord [id number]  Exam Date/Time: 02/27/08 0001  Admitting Diagnosis: CHEST PAIN 786.50  Reason for exam:           Order Note:       cc: Ellwood Sayers. M.D.           EPI #:   [id number]     Accession #:  [id number]     Interventional M.D.:  Philomena Course. Rusty Aus, M.D.        Indication:        ICD-9           Indications        411.1           Unstable/preinfarction        414.01          Coronary artery, native        V45.82          PTCA        Summary Comments:     1. Successful drug-eluting stenting at OM2 beyond the patent,     previously deployed stent, reducing the stenosis from 80% to     0%.     2. Widely patent stent at the mid RCA.     3. Moderate LAD stenosis.     Plan:     Continue Plavix  -  75 mg daily for one year.     Continue ASA daily for one year.     Integrilin drip to infuse.     Consider LAD PCI if symptomatic.     Consider left iliac PCI.     Findings/Summary:      CORONARY ANGIOGRAPHY FINDINGS        DOMINANCE:  Right dominant           LEFT MAIN: Normal           LEFT ANTERIOR DESCENDING:   70% mid           LEFT CIRCUMFLEX:     80% large OM2 beyond patent     stent           RIGHT CORONARY:      40% ostial; widely patent stent     mid     INTERVENTION:  Drug Eluting Stenting of the 2nd Marginal was     performed improving the stenosis from 80% to 0% with TIMI 3     flow     Lesion Findings:     * Circumflex     There is a 80% stenosis in the 2nd Marginal.  The denovo     lesion has a TIMI flow of 3. An intervention was performed on     the 2nd Marginal with a final stenosis of  0%.  There were no     lesion complications. The final TIMI flow was 3.               Interventional Device(s)       Vessel Segment  Name  Diameter   Length       2nd Marginal    Promus DES RX              2.5        18              Diagnostic Technique:     The access site was prepped and draped in a sterile manner.     Local anesthetic was then injected subcutaneously.   Arterial     access was obtained and an introducer sheath was placed in the     right femoral artery.  Right and left coronary angiography was     performed in multiple views by hand injections of non ionic     contrast.        Interventional Technique:     The coronary artery was successfully engaged using a guide     catheter.  The interventional guidewire was advanced down the     coronary artery and across the target lesion.  The stent     balloon was advanced over the guidewire, positioned within the     target lesion, and deployed.  Selective coronary angiography     was performed post angioplasty.        Hemodynamic Data        State:  OXYGEN REST         Hemoglobin:  11.8      Pressures        Site                   Pressures      Mean/LVEDP        AO                    84/40         57              CONSENT:  The risk, benefits and alternatives of the procedure          and possible interventions were discussed with the          patient prior to the procedure.  Informed consent was          obtained.        Fluoro Time:  5.4 minutes        Contrast:       Contrast                  Amount       OptiRay 350                130        Total Contrast:  130        Blood Loss:  No        Procedures:       CPT              Procedures       85347            Activated Clotting Time       93508            Selective Coronary without Left Heart Cath       99144            Moderate Sedation first 30 minutes       92980            DES LCX Stent Placement  33295            Coronary Angiography       93556             Imaging  Cor/Ao/Pul       92977             Thrombolysis IV           Equipment Used      Item                                                Qty      OPTIRAY 350 BTL 1333-11                       1      KIT LT HEART 18841660                               1      STATION PROTECTION 63016010                         1      MANIFOLD HI PRESSURE 93235573                       1      GUIDEWIRE 35-150 3J [id number]                        1      NEEDLE MULTI 18GX2.75 408295                        1      KIT INFL DEV ENCR 26 04527-01                       1      INTRODUCER SHEATH 10F RSS602                         1      CATH GUIDE RUNWAY 10F FL4                            1      INTRODUCER SHEATH 5FR X 10CM                        1      GLIDEWIRE ANG .035 180 CW2376                       1      CATH GUIDE RUNWAY 10F FL3.5                          1      GUIDE WIRE CHOICE Extra Support 1 12135-01          1      STENT PROMUS RX 2.5X18                              1  Procedural Medications:     Medication                 Dose       Units         Route     Narcotics: Valium          5          mg            IVP Sedation     Narcotics: Fentanyl        50         mcg           IVP Sedation     Narcotics: Fentanyl        50         mcg           IVP Sedation     Narcotics: Valium          5          mg            IVP Sedation     Heparin                    4000       Units         IVP     Integrilin Bolus           6.8        ml            IV Push     Integrilin 75mg /117ml      12                       rilin     D5W                                                 75mg /121ml D5W     Integrilin Bolus           6.8        ml            IV Push                    Coronary Diagram             Updated by  Philomena Course Rusty Aus, M.D.  on  02/27/2008          19:13:30                   electronically signed on 02/27/2008 19:13:51 with     status of Final        Dictated by:  Philomena Course. Sukin M.D.  D:  02/27/08 1835  T:  02/27/08 1913    PA    This document is confidential medical information.  Unauthorized disclosure or   use of this information is prohibited by law.  If you are not the intended   recipient of this document, please advise [place] by calling immediately   [phone].    Note: An exclamation mark (!) indicates a result that was not dispersed into   the flowsheet.  Document Creation Date: 02/27/2008 7:34 PM  _______________________________________________________________________    (1) Order result status: Final  Collection or observation date-time: 02/27/2008  Requested date-time: 02/27/2008  19:19  Receipt date-time: 02/27/2008 00:01  Reported date-time:   Referring Physician:    Ordering Physician:   [facility] Reviewed [facility] Hospital)  Specimen Source:   Source: Francesco Sor Order Number: [id number] TRANSCRIPTION  Lab site: [facility]

## 2008-02-28 NOTE — Unmapped (Signed)
Signed by   LinkLogic on 02/28/2008 at 11:37:08  Patient: Tracie Romero  Note: All result statuses are Final unless otherwise noted.    Tests: (1)  (D/C Medication Reconciliation)    Order Note:     PLAVIX                             Take 75 MG  (CLOPIDOGREL BISULFATE)           EVERY DAY By Mouth    TRICOR                             Take 145 MG  (FENOFIBRATE)                     EVERY DAY By Mouth    LIPITOR                            Take 80 MG  (ATORVASTATIN CALCIUM)            EVERY DAY By Mouth    FOLATE                             Take 1 MG  (FOLIC ACID)                      EVERY DAY By Mouth    TENORMIN                           Take 50 MG  (ATENOLOL)                        EVERY DAY By Mouth    PRINIVIL                           Take 10 MG  (LISINOPRIL)                      EVERY DAY By Mouth    BUSPAR                             Take 15 MG  (BUSPIRONE HCL)                   TWO TIMES A DAY By Mouth    NITROSTAT                          Take 0.4 MG  (NITROGLYCERIN)                   AS NEEDED By SUBLINGUAL  As Needed for:    CLARIFY:                           Take 1 EA  (CLARIFY:CHANTIX STRENGTH?)       AS NEEDED By Mouth  As Needed for:    ASA                                Take 325 MG  (ASPIRIN)  EVERY DAY By Mouth      New Orders:  ISOSRBIDE MONONITRATE 30MG  ONCE A DAY    A copy of this report was provided to patient: ARIABELLA, BRIEN            Order Note: [facility]  [phone]  Admission Date/Time: 02/26/08   Discharge Date/Time: //   Unit #: [id number]  Location: [facility]11[facility]  Account #: [id number]  Primary Insurance: [employer] PREFERRED  Admitting Diagnosis: CHEST PAIN 786.50  Report #: [id number]      Order Note: _________________________________  Signed by:            DC  D:  02/28/08 1103  T:  02/28/08 1103    This document is confidential medical information.  Unauthorized disclosure or   use of this information is  prohibited by law.  If you are not the intended   recipient of this document, please advise [place] by calling immediately   [phone].    Note: An exclamation mark (!) indicates a result that was not dispersed into   the flowsheet.  Document Creation Date: 02/28/2008 11:37 AM  _______________________________________________________________________    (1) Order result status: Final  Collection or observation date-time: 02/28/2008 11:03  Requested date-time: 02/28/2008 11:03  Receipt date-time:   Reported date-time:   Referring Physician:    Ordering Physician:  Reviewed In Hospital [facility])  Specimen Source:   Source: Francesco Sor Order Number: [id number] TRANSCRIPTION  Lab site: [facility]

## 2008-03-05 NOTE — Unmapped (Signed)
Signed by Harless Litten DO on 03/05/2008 at 23:03:01

## 2008-03-06 NOTE — Unmapped (Signed)
Signed by Letta Pate MA on 03/06/2008 at 11:57:24    Phone Note   Patient Call  Caller: patient  Call for: [facility]    Summary of Call: PT MAILED [facility] PAPERWORK  TO YOUR OFFICE. WANTS TO KNOW IF YOU RECEIVED THEM & WHEN THEY WILL BE FILLED OUT. CALL PATIENT @ [phone]     Initial call taken by: Ronelle Nigh,  March 06, 2008 9:59 AM      Follow-up for Phone Call   I filled them out last PM and gave them to [facility].  Follow-up by: Harless Litten DO,  March 06, 2008 11:11 AM    Additional Follow-up for Phone Call   forms completed and copied pt. copy at the front desk our copy in the to be scan bin c/p lmom.  Additional Follow-up by: Letta Pate MA,  March 06, 2008 11:57 AM

## 2008-03-09 NOTE — Unmapped (Signed)
Signed by Harless Litten DO on 03/09/2008 at 00:00:00  Cardiac       Imported By: Evette Doffing 03/23/2008 14:44:56    _____________________________________________________________________    External Attachment:    Please see [employer] EMR for this document.

## 2008-03-12 NOTE — Unmapped (Signed)
Signed by   LinkLogic on 03/12/2008 at 17:35:21  Patient: Tracie Romero  Note: All result statuses are Final unless otherwise noted.    Tests: (1) SW Aortogram Generic Order [id number] (SWA)    Order Note:     Order Note: Site: [facility] CARDIAC CATH LAB  [phone]  Unit #: [id number]  Location: [facility]11CD  Account #: [id number]  Req #: [id number]  Order #: [id number]  Procedure: SW Aortogram Generic Order [id number]  Exam Date/Time: 03/12/08 0001  Admitting Diagnosis: CAD 414.01,BLE CLAUDICATION  Reason for exam:           Order Note:         INDICATION:  The patient has severe intermittent claudication who presented   to my office with two episodes of chest pain at rest consistent with unstable   angina.       OPERATOR:  Marcelline Mates, M.D.        PROCEDURE:  Coronary angiography, angioplasty and stenting of the mid LAD   with a drug eluting stent, left common iliac and external iliac arterial   angiography from a left common femoral access  approach, left common iliac   artery angioplasty and stent placement with a self expanding stent.        The patient was prepped and draped supine on the angiography table.    Fentanyl and Versed were administered for conscious sedation.  2% Lidocaine   was instilled into the left common femoral artery  region for local   anesthesia.  Via modified Seldinger technique a 6 French short sheath was   advanced into the left common femoral artery.  Angiography of the iliac system   revealed a dissected hazy  70-80% stenosis in the left common iliac artery.    Therefore, we started Angio-Max bolus and drip.  We switched out to a 6 [place]   Cordis bright tipped 23 cm sheath and advanced this up to just below  the   lesion performing more digital subtraction angiography.  Using a Wholey wire   we were able to advance a 7 x 4 Admiral balloon which we deployed at 6   atmospheres of pressure.  This resulted in  further dissection of the vessel.    Therefore, we had to bring up a 8 x 6  Zilver stent which we placed extending   from the ostium of the left common iliac artery beyond the lesion with an   excellent  angiographic result.  At this we left the [facility] wire in place.  We   reintroduced the dilator of the sheath and advanced the sheath through the   stent as we did not want to drag our coronary equipment  through the stent.    At this point we were able to advance a 6 [place] AR2 catheter and performed   RCA angiography which revealed widely patent right coronary arterial stent.    We then went up with a 6  [place] JL4 guide and performed angiography which   delineated an 80% mid LAD lesion which was eccentric.  At this point I brought   down a marker wire and performed angiography which revealed the lesion  to be   approximately 20 mm in length.  Therefore, we brought up a 2.5 x 23 Cypher   drug eluting stent which we deployed at 12 atmospheres of pressure relieving   the waist.  We then withdrew this balloon  and brought up a 2.5 x 20 Quantum  Maverick balloon which we deployed at 20 atmospheres of pressure to post   dilate the mid portion of the stent.  Final angiography revealed TIMI-III flow   throughout the  LAD system.  The balloon and wire were withdrawn from the   coronary artery and final angiography revealed an excellent angiographic   result.  We then withdrew the catheter from the body and over a  J-wire   replaced the long 6 French sheath for a short 6 [place] sheath which was sown   in place.        FINDING:        There was a hazy dissected 70-80% left common iliac arterial lesion.     The RCA is a dominant vessel and has a widely patent mid stent.     The left circumflex coronary artery has a widely patent mid overlapping   stents.  The first diagonal artery has a widely patent stent.  In the medial   branch just distal to the stent there is  approximately a 30-40% stenosis.     The LAD is widely patent, however, there is a mid 70-80% lesion just beyond   the large first  septal perforator.        IMPRESSION:     Three vessel coronary artery disease.     Status post successful angioplasty and stenting of the mid LAD with a 2.5 x   23 Cypher drug eluting stent.     Widely patent mid left circumflex overlapping stents.     Widely patent mid RCA stents.     Widely patent first diagonal stent.     Successful angioplasty and stenting of a dissected and severely stenosed   left common iliac artery with an 8 x 6 Zilver stent.        PLAN:  Continue aspirin and Plavix in addition to medical therapy for   cardiovascular risk factor reduction.  We will remove the sheath per protocol.    The patient will be observed in the  Angio-Suite overnight.              Dictated by:  Judieth Keens M.D.  D:  03/12/08 0000  T:  03/12/08 1652   KK    This document is confidential medical information.  Unauthorized disclosure or   use of this information is prohibited by law.  If you are not the intended   recipient of this document, please advise [place] by calling immediately   [phone].    Note: An exclamation mark (!) indicates a result that was not dispersed into   the flowsheet.  Document Creation Date: 03/12/2008 5:35 PM  _______________________________________________________________________    (1) Order result status: Final  Collection or observation date-time: 03/12/2008  Requested date-time: 03/12/2008 16:52  Receipt date-time: 03/12/2008 00:01  Reported date-time:   Referring Physician:    Ordering Physician:   [facility] Reviewed In Hospital)  Specimen Source:   Source: Francesco Sor Order Number: [id number] TRANSCRIPTION  Lab site: [facility]

## 2008-03-13 NOTE — Unmapped (Signed)
Signed by   LinkLogic on 03/13/2008 at 10:29:25  Patient: Tracie Romero  Note: All result statuses are Final unless otherwise noted.    Tests: (1)  (D/C Medication Reconciliation)    Order Note:     PLAVIX                             Take 75 MG  (CLOPIDOGREL BISULFATE)           EVERY DAY By Mouth    TRICOR                             Take 145 MG  (FENOFIBRATE)                     EVERY DAY By Mouth    LIPITOR                            Take 80 MG  (ATORVASTATIN CALCIUM)            EVERY DAY By Mouth    FOLATE                             Take 1 MG  (FOLIC ACID)                      EVERY DAY By Mouth    TENORMIN                           Take 50 MG  (ATENOLOL)                        EVERY DAY By Mouth    IMDUR                              Take 60 MG  (ISOSORBIDE MONONITRATE)          EVERY DAY By Mouth    PRINIVIL                           Take 10 MG  (LISINOPRIL)                      EVERY DAY By Mouth    XANAX                              Take 1 MG  (ALPRAZOLAM)                      TWO TIMES A DAY By Mouth    CHEWABLE ASPIRIN                   Take 81 MG  (ASPIRIN)                         EVERY DAY By Mouth    BUSPAR                             Take 15 MG  (BUSPIRONE HCL)  TWO TIMES A DAY By Mouth      New Orders:    A copy of this report was provided to patient: RHODESIA, STANGER            Order Note: Site: [facility]  [phone]  Admission Date/Time: 03/12/08   Discharge Date/Time: //   Unit #: [id number]  Location: [facility]  Account #: [id number]  Primary Insurance: [employer] PREFERRED  Admitting Diagnosis: CAD 414.01,BLE CLAUDICATION  Report #: [id number]      Order Note: _________________________________  Signed by:            DC  D:  03/13/08 1601  T:  03/13/08 0932    This document is confidential medical information.  Unauthorized disclosure or   use of this information is prohibited by law.  If you are not the intended   recipient of this  document, please advise [place] by calling immediately   [phone].    Note: An exclamation mark (!) indicates a result that was not dispersed into   the flowsheet.  Document Creation Date: 03/13/2008 10:29 AM  _______________________________________________________________________    (1) Order result status: Final  Collection or observation date-time: 03/13/2008 09:37  Requested date-time: 03/13/2008 09:37  Receipt date-time:   Reported date-time:   Referring Physician:    Ordering Physician:  Reviewed In Hospital [facility])  Specimen Source:   Source: Francesco Sor Order Number: [id number] TRANSCRIPTION  Lab site: [facility]

## 2008-03-13 NOTE — Unmapped (Signed)
Signed by Harless Litten DO on 03/13/2008 at 00:00:00  Cardiac       Imported By: Evette Doffing 03/30/2008 11:58:43    _____________________________________________________________________    External Attachment:    Please see [employer] EMR for this document.

## 2008-03-18 NOTE — Unmapped (Signed)
Signed by Harless Litten DO on 03/18/2008 at 00:00:00  Insurance Correspondence      Imported By: Evette Doffing 03/30/2008 13:07:32    _____________________________________________________________________    External Attachment:    Please see [employer] EMR for this document.

## 2008-03-25 NOTE — Unmapped (Signed)
Signed by Letta Pate MA on 03/25/2008 at 12:35:04    Prescriptions:  PLAVIX 75 MG TABS (CLOPIDOGREL BISULFATE) 1 qd  #30 x 1   Entered by: Letta Pate MA   Authorized by: Harless Litten DO   Signed by: Letta Pate MA on 03/25/2008   Method used: Telephoned to ...     [facility]     [address]     [place], [place]  [address]     Ph: [phone]     Fax: [phone]   RxID: [id number]  Prudy Feeler 1 MG TABS (ALPRAZOLAM) 1 by mouth every 8 hrs as needed nerves.  #42 x 1   Entered by: Letta Pate MA   Authorized by: Harless Litten DO   Signed by: Letta Pate MA on 03/25/2008   Method used: Telephoned to ...     [facility]     [address]     [place], [place]  [address]     Ph: [phone]     Fax: [phone]   RxID: [id number]

## 2008-03-26 NOTE — Unmapped (Signed)
Signed by Letta Pate MA on 03/26/2008 at 10:25:40    Phone Note   Outgoing Call    Call placed by: Letta Pate MA,  March 26, 2008 10:24 AM  Call placed to: Patient  Summary of call: form completed c/p lmom.

## 2008-03-30 NOTE — Unmapped (Signed)
Signed by Everitt Amber RN on 03/30/2008 at 16:26:31    Phone Note   Patient Call  Call back at Home Phone: [phone]  Caller: patient  Department: IM - Cardiology    Reason for Call: speak with nurse  Summary of Call: call from pt stating that Dr. Marcelline Mates is now her cardiologist and she will no longer be seen by Dr. Richardine Service.  Pt requested note be put in her chart with this information.  Dr. Richardine Service flagged with notification.      Initial call taken by: Everitt Amber RN,  March 30, 2008 4:25 PM

## 2008-04-02 NOTE — Unmapped (Signed)
Signed by Letta Pate MA on 04/02/2008 at 10:22:30    Prescriptions:  LIPITOR 80 MG TABS (ATORVASTATIN CALCIUM) daily  #30 x 1   Entered by: Letta Pate MA   Authorized by: Harless Litten DO   Signed by: Letta Pate MA on 04/02/2008   Method used: Telephoned to ...     [facility]     [address]     [place], [place]  [address]     Ph: [phone]     Fax: [phone]   RxID: [id number]

## 2008-04-02 NOTE — Unmapped (Signed)
Signed by Letta Pate MA on 04/02/2008 at 10:20:01    Prescriptions:  TRICOR 145 MG TABS (FENOFIBRATE) qd  #30 x 1   Entered by: Letta Pate MA   Authorized by: Harless Litten DO   Signed by: Letta Pate MA on 04/02/2008   Method used: Telephoned to ...     [facility]     [address]     [place], [place]  [address]     Ph: [phone]     Fax: [phone]   RxID: [id number]

## 2008-04-14 NOTE — Unmapped (Signed)
Signed by Harless Litten DO on 04/14/2008 at 00:00:00  Cardiac       Imported By: Evette Doffing 04/20/2008 14:11:04    _____________________________________________________________________    External Attachment:    Please see [employer] EMR for this document.

## 2008-04-21 NOTE — Unmapped (Signed)
Signed by   [employer] on 04/22/2008 at 14:46:20  Patient: Tracie Romero  Note: All result statuses are Final unless otherwise noted.    Tests: (1)  (Discharge Summary Report)    Order Note:       ATTENDING PHYSICIAN:  Judieth Keens, M.D.    PRINCIPAL DIAGNOSES:  1.  Chest pain.  2.  Coronary artery disease.  3.  Atherosclerosis and peripheral vascular disease.  4.  Claudication.  5.  Hypertension.  6.  Hyperlipidemia.  7.  Tobacco use disorder.  8.  Status post percutaneous transluminal coronary angioplasty and stenting.  9.  Hypercholesterolemia.  10.  History of transient ischemic attack and cerebral infarction.    PRINCIPAL PROCEDURE:  Coronary angiogram on 02/19/08 and right iliac   angioplasty on 02/21/08.    BRIEF HISTORY:  The patient was transferred from [facility] for coronary angiogram due to chest pain.  She has a history of   hypertension, transient ischemic attacks, stents in the past and   hypercholesterolemia.  She was admitted to the Step-down unit following the   angiogram.    PHYSICAL EXAMINATION:  Showed there is a right carotid bruit.  Lungs were   clear.  Heart sounds were regular without rub, murmur or gallop.  Peripheral   pulses were faint, but intact without any pedal edema noted.    EKG shows sinus rhythm.  Adenosine Myoview showed no ischemia with an ejection   fraction of 62%.    LABORATORY DATA:  Hemoglobin was 11.6, hematocrit was 35%, and white blood   cell was 7.1.  BUN was 8, creatinine was 0.64, potassium 3.8 and cholesterol   226, triglycerides 244, HDL 28, and LDL 129.    HOSPITAL COURSE:  The patient was transferred from [facility] for   coronary angiogram, which showed a 50-70% mid LAD, 50-70% circumflex artery,   patent mid RCA stent and ejection fraction of 60-65%.  The stents to RCA   diagonal obtuse marginal three were all patent.  Adenosine Myoview done   following procedure showed no ischemia, EF of 61%, ABIs were done and then  she   ended up having a right iliac artery angioplasty, the left common iliac was   70% on cath and the right common was 50%.  She tolerated the procedure well.    Activity was increased and she was able to be discharged home on 02/22/08 in   good condition.    DISCHARGE INSTRUCTIONS:  Activity as instructed.  Low-fat, low-cholesterol,   and low-sodium diet.  She is to follow up with Dr. Daphine Deutscher in two weeks.    DISCHARGE MEDICATIONS:  As per the medication reconciliation form.    DICTATED BY:  Veatrice Kells, R.N.                  Order Note: Site: [facility]  [phone]  Admission Date/Time: 02/19/08   Discharge Date/Time: 02/22/08   Unit #: [id number]  Location: GS11E  Account #: [id number]  Primary Insurance: [employer] PREFERRED  Admitting Diagnosis: CHEST PAIN, CAD 414.01  Report #: [id number]      Order Note: _________________________________  Signed by:    Judieth Keens M.D.      SA  D:  04/21/08   T:  04/22/08 1356    This document is confidential medical information.  Unauthorized disclosure or   use of this information is prohibited by law.  If you are  not the intended   recipient of this document, please advise [place] by calling immediately   [phone].    Note: An exclamation mark (!) indicates a result that was not dispersed into   the flowsheet.  Document Creation Date: 04/22/2008 2:46 PM  _______________________________________________________________________    (1) Order result status: Final  Collection or observation date-time: 04/21/2008  Requested date-time: 04/22/2008 13:56  Receipt date-time:   Reported date-time:   Referring Physician:    Ordering Physician:   [facility] Reviewed In Hospital)  Specimen Source:   Source: Francesco Sor Order Number: [id number] TRANSCRIPTION  Lab site: [facility]

## 2008-04-24 NOTE — Unmapped (Signed)
Signed by Milta Deiters MA on 04/24/2008 at 11:44:42    Prescriptions:  FOLIC ACID 1 MG TABS (FOLIC ACID) 1 qd  #30 x 3   Entered by: Milta Deiters MA   Authorized by: Lynwood Dawley DO   Signed by: Milta Deiters MA on 04/24/2008   Method used: Telephoned to ...     [facility]     [address]     [place], [place]  [address]     Ph: [phone]     Fax: [phone]   RxID: [id number]

## 2008-04-24 NOTE — Unmapped (Signed)
Signed by Milta Deiters MA on 04/24/2008 at 10:42:50    Prescriptions:  ATENOLOL 25 MG TABS (ATENOLOL) one by mouth daily  #30 x 3   Entered by: Milta Deiters MA   Authorized by: Lynwood Dawley DO   Signed by: Milta Deiters MA on 04/24/2008   Method used: Telephoned to ...     [facility]     [address]     [place], [place]  [address]     Ph: [phone]     Fax: [phone]   RxID: [id number]

## 2008-05-01 NOTE — Unmapped (Signed)
Signed by Milta Deiters MA on 05/02/2008 at 09:12:12    PHONE NOTE - Patient Call    Caller: patient  Call for: Janice Seales    Reason for Call: Pt would like refills on the following medications:  Lipitor 80mg  1x a day  Tricor 145mg  1x a day  Xanax 1mg  #42  [facility] [phone]  Pt #[phone]  Thanks    Current Allergies:   NKA      Initial call taken by: Elenora Gamma,  May 01, 2008 10:04 AM      FOLLOW UP  please let patient know that dr Phoebe Sharps will handle on Monday  Follow-up by: Nolon Lennert Duell DO,  May 01, 2008 1:02 PM    ADDITIONAL FOLLOW UP  OK to refill all 3 x 1  patient advised  Follow-up by: Harless Litten DO,  May 02, 2008 8:29 AM    ADDITIONAL FOLLOW UP  rx doe vm  pt notified  Follow-up by: Milta Deiters MA,  May 02, 2008 9:11 AM

## 2008-06-12 NOTE — Unmapped (Signed)
Signed by Jackelyn Knife MA on 06/12/2008 at 16:24:49    PHONE NOTE - Patient Call    Call back at Home Phone: [phone]  Caller: patient  Call for: Brittany/Dr Samuele Storey     Reason for Call: speak with nurse. Pt called stating that she just received refill for her Xanax and husband dumped medication in toilet and did not tell patient about this.  Pt would like to know if there is any other medication she can be prescribed for her nerves and anxiety since she cannot get another script for the Xanax right now.  Please call patient back and advise at [phone].  Thank you.     Pharmacy Information: [facility] [phone]      Initial call taken by: Brand Males Back,  June 12, 2008 11:54 AM    Current Allergies:   NKA      New Medications:  Prescriptions:  BUSPAR 15 MG TABS (BUSPIRONE HCL) 1 bid  #60 x 0   Entered by: Jackelyn Knife MA   Authorized by: Harless Litten DO   Signed by: Jackelyn Knife MA on 06/12/2008   Method used: Telephoned to ...     [facility]     [address]     [place], [place]  [address]     Ph: [phone]     Fax: [phone]   RxID: [id number]  BUSPAR 15 MG TABS (BUSPIRONE HCL) 1 bid    FOLLOW UP  Rx Buspar 15mg   #60  1 by mouth twice a day.  Follow-up by: Harless Litten DO,  June 12, 2008 3:56 PM    ADDITIONAL FOLLOW UP  patient advised, Rx completed  Follow-up by: Jackelyn Knife MA,  June 12, 2008 4:21 PM    Prescriptions:  BUSPAR 15 MG TABS (BUSPIRONE HCL) 1 bid  #60 x 0   Entered by: Jackelyn Knife MA   Authorized by: Harless Litten DO   Signed by: Jackelyn Knife MA on 06/12/2008   Method used: Telephoned to ...     [facility]     [address]     [place], [place]  [address]     Ph: [phone]     Fax: [phone]   RxID: [id number]

## 2008-10-09 NOTE — Unmapped (Signed)
Signed by Harless Litten DO on 10/09/2008 at 00:00:00  Cardiac       Imported By: Evette Doffing 10/16/2008 17:24:03    _____________________________________________________________________    External Attachment:    Please see [employer] EMR for this document.

## 2008-10-16 NOTE — Unmapped (Signed)
Signed by Abner Greenspan MA on 10/16/2008 at 15:30:00    PHONE NOTE - Patient Call    Call back at Home Phone: [phone]  Caller: patient  Call for: dr Kandice Hams     Reason for Call: pt called in and she would like to have a refill of   XANAX 1 MG TABS (ALPRAZOLAM) 1 by mouth every 8 hrs as needed nerves.  pharmacy [phone] hm [phone]    Pharmacy Information: [phone]       Initial call taken by: Garnet Koyanagi,  October 16, 2008 2:19 PM      ADDITIONAL FOLLOW UP  called med into pharm  patient advised, Rx completed  Follow-up by: Abner Greenspan MA,  October 16, 2008 3:29 PM    Prescriptions:  XANAX 1 MG TABS (ALPRAZOLAM) 1 by mouth every 8 hrs as needed nerves.  #42 x 0   Entered and Authorized by: Lynwood Dawley DO   Signed by: Lynwood Dawley DO on 10/16/2008   Method used: Print then Give to Patient   RxID: [id number]

## 2008-11-24 NOTE — Unmapped (Signed)
Signed by Durward Parcel on 11/24/2008 at 12:02:58        Denied Prescriptions   XANAX 1 MG TABS (ALPRAZOLAM) 1 by mouth every 8 hrs as needed nerves. denied because patient needs office visit.

## 2008-12-14 NOTE — Unmapped (Signed)
Signed by Letta Pate MA on 12/14/2008 at 10:20:23    Prescriptions:  FOLIC ACID 1 MG TABS (FOLIC ACID) 1 qd  #30 x 0   Entered by: Letta Pate MA   Authorized by: Harless Litten DO   Signed by: Letta Pate MA on 12/14/2008   Method used: Telephoned to ...     [facility]     [address]     [place], [place]  [address]     Ph: [phone]     Fax: [phone]   RxID: [id number]

## 2008-12-23 LAB — LIPOPROTEIN ELECTROPHORESIS: LDL Calculated: 201 mg/dL — ABNORMAL HIGH (ref 0–100)

## 2008-12-23 LAB — BASIC METABOLIC PANEL
BUN: 9 mg/dL (ref 6–20)
CO2: 23 meq/L (ref 21–31)
Calcium: 9.7 mg/dL (ref 8.6–10.5)
Chloride: 102 meq/L (ref 98–107)
Creatinine: 0.7 mg/dL (ref 0.6–1.1)
GFR MDRD Non Af Amer: 96.2 mL/min (ref >60.0)
Glucose: 100 mg/dL (ref 70–110)
Potassium: 4.5 mmol/L (ref 3.6–5.0)
Sodium: 142 mmol/L (ref 135–145)

## 2008-12-23 LAB — ALT: ALT: 21 U/L (ref 10–50)

## 2008-12-23 LAB — LIPID PANEL
Chol/HDL Ratio: 7.9 — ABNORMAL HIGH (ref 0.0–4.0)
Cholesterol, Total: 299 mg/dL — ABNORMAL HIGH (ref 0–200)
HDL: 38 mg/dL — ABNORMAL LOW (ref >40)
Triglycerides: 300 mg/dL — ABNORMAL HIGH (ref 0–150)

## 2008-12-23 LAB — AST: AST: 20 U/L (ref 5–34)

## 2008-12-23 NOTE — Unmapped (Signed)
Signed by Harless Litten DO on 12/23/2008 at 23:36:03    Clinical Lists Changes    Observations:  Added new observation of LDL: 201 mg/dL (65/78/4696 29:52)            Preventive Maintenance     LDL: 77 (12/05/2007 8:48:00 AM)   LDL Test Date: 12/23/2008 LDL: 201     Coordinating Care Providers   PCP Name: Ian Bushman, MD  Cardiologist: Dr. Matthias Hughs  Vascular: Dr. Colbert Ewing

## 2008-12-23 NOTE — Unmapped (Signed)
Signed by Harless Litten DO on 12/23/2008 at 23:32:33  Patient: Tracie Romero  Note: All result statuses are Final unless otherwise noted.    Tests: (1) Lipid Panel (1055)    Order Note: FASTING STATUS:  UNKNOWN    Order Note:     CHOLESTEROL          [H]  299 mg/dL                   5-409    TRIGLYCERIDES        [H]  300 mg/dL                   8-119    CHOL/HDL RATIO       [H]  7.9                         0.0-4.0    HDL                  [L]  38 mg/dL                    >14    Note: An exclamation mark (!) indicates a result that was not dispersed into   the flowsheet.  Document Creation Date: 12/23/2008 8:22 PM  _______________________________________________________________________    (1) Order result status: Final  Collection or observation date-time: 12/23/2008 09:00  Requested date-time:   Receipt date-time: 12/23/2008 16:34  Reported date-time:   Referring Physician:    Ordering Physician: Helane Rima D.O. Juliane Poot)  Specimen Source:   Source: [name] Order Number: [id number]  Lab site:

## 2008-12-23 NOTE — Unmapped (Signed)
Signed by Harless Litten DO on 12/23/2008 at 09:06:03      Reason for Visit   Chief Complaint: check up refill on meds       Allergies  No Known Allergies    Medications   PLAVIX 75 MG TABS (CLOPIDOGREL BISULFATE) 1 qd  ASPIRIN 81 MG TBEC (ASPIRIN) 1 qd  FOLIC ACID 1 MG TABS (FOLIC ACID) 1 qd  ISOSORBIDE MONONITRATE CR 60 MG TB24 (ISOSORBIDE MONONITRATE) 1 qd  LIPITOR 80 MG TABS (ATORVASTATIN CALCIUM) daily  TRICOR 145 MG TABS (FENOFIBRATE) qd  NITROGLYCERIN SUBL (NITROGLYCERIN SUBL) 0.4mg  as needed  ATENOLOL 25 MG TABS (ATENOLOL) one by mouth daily  XANAX 1 MG TABS (ALPRAZOLAM) 1 by mouth every 8 hrs as needed nerves.  * CHANTIX - CONTINUING MONTH as directed  BUSPAR 15 MG TABS (BUSPIRONE HCL) 1 bid       Vital Signs:   Ht: 61 in.  Wt: 170 lbs.      BMI: 32.24  BSA: 1.76  Wt chg (lbs): -3.80  BP: 124/80    Intake recorded by: Letta Pate MA on December 23, 2008 8:21 AM      HPI Multiple Chronic   Anxiety/Depression        Current Status: Stable       Current Treatment Anxiolytics       Compliance with Treatment: Good       Medication Tolerance Good       Comments: Pt feels that the xanax helps with her anxiety.   CAD       Current Status: Stable       Comments: Pt denies any recent episodes of chest pain.  Hyperlipidemia       Current Status: Unchanged       Non Pharmacologic treatment: Low fat/Low cholesterol diet       Compliance of treatment: Good       Compliance with diet: Good       Compliance with exercise: Good       Medication tolerance: Good       Comments: Pt is fasting for lipid recheck today.  Hypertension       Current Status: Stable       Home BP Monitoring: No       Compliance with treatment: Good       Medication Tolerance: Good       Comments: Pt has quit smoking in June.  She is drinking more water and is working out on a treadmill near daily and taking OTC fish oil.      Other Diagnosis:    Pt quit smoking after her last stent in June.       Review of Systems  Cardiovascular: Denies chest  pains, dyspnea at rest, palpitations.       Physical Examination:   BP: 124/  80   Blood Pressure updated by physician.    Additional Vitals:   BP: 130 / 90    Physical Exam- Detail:   General Appearance: well-developed, well-nourished and in no acute distress.  Ears: No lesions.  Tympanic membranes translucent, non-bulging.  Canal walls pink, without discharge.  Hearing grossly intact.  Oropharynx: Normal appearance.  No erythema, exudate or mass. No tonsillar swelling.  Respiratory: Respiration un-labored.  Lung fields clear to auscultation.  No wheezing, rales, rhonchi or pleural rub.  Neck: No thyromegaly.  No nodules, masses or tenderness.  Lymphatic: Areas palpated not enlarged:  cervical, supraclavicular.  Cardiac: S1 and S2  normal.  RRR without murmurs, rubs, gallops.  No JVD.  Vascular: Right carotid bruit  Pedal Pulse Left: dorsalis pedis +2, posterior tib +2  Pedal Pulse Right: dorsalis pedis +2,  posterior tib +2           New Problems:  TOBACCO USE, QUIT (ICD-V15.82)      Preventive Maintenance       Coordinating Care Providers   PCP Name: Ian Bushman, MD  Cardiologist: Dr. Matthias Hughs  Vascular: Dr. Colbert Ewing            Prescriptions:  Prudy Feeler 1 MG TABS (ALPRAZOLAM) 1 by mouth every 8 hrs as needed nerves.  #42 x 0   Entered and Authorized by: Harless Litten DO   Signed by: Harless Litten DO on 12/23/2008   Method used: Print then Give to Patient   RxID: [id number]  ATENOLOL 25 MG TABS (ATENOLOL) one by mouth daily  #30 x 5   Entered and Authorized by: Harless Litten DO   Signed by: Harless Litten DO on 12/23/2008   Method used: Print then Give to Patient   RxID: [id number]  TRICOR 145 MG TABS (FENOFIBRATE) qd  #30 x 5   Entered and Authorized by: Harless Litten DO   Signed by: Harless Litten DO on 12/23/2008   Method used: Print then Give to Patient   RxID: [id number]  LIPITOR 80 MG TABS (ATORVASTATIN CALCIUM) daily  #30 x 5   Entered and Authorized  by: Harless Litten DO   Signed by: Harless Litten DO on 12/23/2008   Method used: Print then Give to Patient   RxID: [id number]  ISOSORBIDE MONONITRATE CR 60 MG TB24 (ISOSORBIDE MONONITRATE) 1 qd  #30 x 5   Entered and Authorized by: Harless Litten DO   Signed by: Harless Litten DO on 12/23/2008   Method used: Print then Give to Patient   RxID: [id number]  FOLIC ACID 1 MG TABS (FOLIC ACID) 1 qd  #30 x 5   Entered and Authorized by: Harless Litten DO   Signed by: Harless Litten DO on 12/23/2008   Method used: Print then Give to Patient   RxID: [id number]  PLAVIX 75 MG TABS (CLOPIDOGREL BISULFATE) 1 qd  #30 x 5   Entered and Authorized by: Harless Litten DO   Signed by: Harless Litten DO on 12/23/2008   Method used: Print then Give to Patient   RxID: [id number]      Assessment and Plan     Problems   Status of Existing Problems:  Assessed CAD as improved - Continue taking current medications.  Harless Litten DO - Signed  Assessed HYPERTENSION as improved - Continue taking current medications. / chem 7 - Harless Litten DO - Signed  Assessed CAROTID ARTERY DISEASE as comment only - Per Dr. Daphine Deutscher - Harless Litten DO - Signed  Assessed HYPERLIPIDEMIA as comment only - Lipid profile, SGOT, SGPT  / send results to Dr. Daphine Deutscher - Harless Litten DO - Signed  New Problems:  Dx of TOBACCO USE, QUIT (ICD-V15.82)  Onset: 03/02/2008    Medications   New Prescriptions/Refills:  XANAX 1 MG TABS (ALPRAZOLAM) 1 by mouth every 8 hrs as needed nerves.  #42 x 0, 12/23/2008, Lucilla Edin Hartman Minahan DO  ATENOLOL 25 MG TABS (ATENOLOL) one by mouth daily  #30 x 5, 12/23/2008, Earl Lites A Kita Neace DO  TRICOR 145 MG TABS (FENOFIBRATE) qd  #  30 x 5, 12/23/2008, Lucilla Edin Latanja Lehenbauer DO  LIPITOR 80 MG TABS (ATORVASTATIN CALCIUM) daily  #30 x 5, 12/23/2008, Earl Lites A Lumina Gitto DO  ISOSORBIDE MONONITRATE CR 60 MG TB24 (ISOSORBIDE MONONITRATE) 1 qd  #30 x 5, 12/23/2008,  Earl Lites A Jiyan Walkowski DO  FOLIC ACID 1 MG TABS (FOLIC ACID) 1 qd  #30 x 5, 14/78/2956, Earl Lites A Denean Pavon DO  PLAVIX 75 MG TABS (CLOPIDOGREL BISULFATE) 1 qd  #30 x 5, 12/23/2008, Lucilla Edin Kaytie Ratcliffe DO    Today's Orders   Lipid Profile   (FATS) (7600) [CPT-80061]  AST    (SGOT) (822) [*CPT-84450]  ALT    (SGPT) (823)  [*CPT-84460]  Metabolic Panel, Basic (EP1) (10165) [CPT-80048]  99214 - Ofc Vst, Est Level IV [CPT-99214]  Venipuncture [CPT-36415]    Disposition:   Return to clinic for Doctor Visit in 6 month(s)

## 2008-12-23 NOTE — Unmapped (Signed)
Signed by Harless Litten DO on 12/23/2008 at 23:40:03          Earl Lites A. Phoebe Sharps, DO  [facility]  [address]  [place], [place] [address]  Phone: [phone]  Fax: [phone]     December 23, 2008      [facility]  [address]    [place], [place] [address]                                                                                           RE:  TEST RESULTS   (Tracie Romero--September 16, 1961)         Dear Ms. Phoenix:      The following is an interpretation of your most recent tests.  Please take note of any instructions provided.   Electrolyte studies:  Normal         Glucose test normal:  100   Kidney function studies:  Normal    Liver function studies:  Normal    Lipid panel:  Abnormal          Triglyceride: 300   Cholesterol: 299   LDL: 201   LDL, calculated: 201   HDL: 38   Chol/HDL%:  7.9        Additional Comments: The goal for cholesterol is below 200.  The goal for triglyceride is below 150.  The goal for HDL (good cholesterol) is above 40.  The goal for LDL (bad cholesterol) is below 70.  Your numbers are extremely elevated compared to last time.  I don't believe your are taking the lipitor and tricor any longer by how high your numbers are.  You are at high risk for further cardiovascular events with numbers like this.  Please resume your medication immediately and we should recheck your fasting Lipid profile, SGOT, SGPT in 6 weeks again.  If you have been taking the medication as prescribed, please make another appointment with me and bring in all your medication for me to verify what you are taking.           Sincerely,         Tracie Romero, D.O.

## 2008-12-23 NOTE — Unmapped (Signed)
Signed by Harless Litten DO on 12/23/2008 at 23:33:03  Patient: Tracie Romero  Note: All result statuses are Final unless otherwise noted.    Tests: (1) AST (1270)    Order Note: FASTING STATUS:  UNKNOWN    Order Note: Comments:     Confidential Patient Information: Accompanied are [facility] results that are being   delivered by [facility].  If you receive a clinical result for a patient that is not yours please   contact [facility] at [phone].    AST                       20 U/L                      5-34    Note: An exclamation mark (!) indicates a result that was not dispersed into   the flowsheet.  Document Creation Date: 12/23/2008 8:22 PM  _______________________________________________________________________    (1) Order result status: Final  Collection or observation date-time: 12/23/2008 09:00  Requested date-time:   Receipt date-time: 12/23/2008 16:34  Reported date-time:   Referring Physician:    Ordering Physician: Helane Rima D.O. Juliane Poot)  Specimen Source:   Source: [name] Order Number: [id number]  Lab site:

## 2008-12-23 NOTE — Unmapped (Signed)
Signed by Harless Litten DO on 12/23/2008 at 23:32:33  Patient: Tracie Romero  Note: All result statuses are Final unless otherwise noted.    Tests: (1) BMP (1010)    Order Note: FASTING STATUS:  UNKNOWN    Order Note:     GLUCOSE                   100 mg/dL                   42-595    BUN                       9 mg/dL                     6-38    CREATININE                0.7 mg/dL                   7.5-6.4    GFR                       96.2                        >60.0      EGFR CALCULATIONS FOR AGES 18 AND OLDER USING THE MDRD FORMULA  ARE NOT   CORRECTED FOR WEIGHT. IF RACE UNKNOWN DEFAULT TO CAUCASIAN.    SODIUM                    142 mEq/L                   135-145    POTASSIUM                 4.5 mEq/L                   3.6-5.0    CHLORIDE                  102 mEq/L                   98-107    CO2                       23 mEq/L                    21-31    CALCIUM                   9.7 mg/dL                   3.3-29.5    Note: An exclamation mark (!) indicates a result that was not dispersed into   the flowsheet.  Document Creation Date: 12/23/2008 8:22 PM  _______________________________________________________________________    (1) Order result status: Final  Collection or observation date-time: 12/23/2008 09:00  Requested date-time:   Receipt date-time: 12/23/2008 16:34  Reported date-time:   Referring Physician:    Ordering Physician: Helane Rima D.O. Juliane Poot)  Specimen Source:   Source: [name] Order Number: [id number]  Lab site:       -----------------    The following lab values were dispersed to the flowsheet  with no units conversion:      SODIUM, 142 MEQ/L, (F)  expected units: mmol/L  POTASSIUM, 4.5 MEQ/L, (F)  expected units: mmol/L

## 2008-12-23 NOTE — Unmapped (Signed)
Signed by Harless Litten DO on 12/23/2008 at 00:00:00  Privacy Notice      Imported By: Evette Doffing 12/23/2008 15:14:33    _____________________________________________________________________    External Attachment:    Please see [employer] EMR for this document.

## 2008-12-23 NOTE — Unmapped (Signed)
Signed by Harless Litten DO on 12/23/2008 at 23:32:33  Patient: Tracie Romero  Note: All result statuses are Final unless otherwise noted.    Tests: (1) ALT (1260)    Order Note: FASTING STATUS:  UNKNOWN    Order Note:     ALT                       21 U/L                      10-50    Note: An exclamation mark (!) indicates a result that was not dispersed into   the flowsheet.  Document Creation Date: 12/23/2008 8:22 PM  _______________________________________________________________________    (1) Order result status: Final  Collection or observation date-time: 12/23/2008 09:00  Requested date-time:   Receipt date-time: 12/23/2008 16:34  Reported date-time:   Referring Physician:    Ordering Physician: Helane Rima D.O. Juliane Poot)  Specimen Source:   Source: [name] Order Number: [id number]  Lab site:

## 2008-12-23 NOTE — Unmapped (Signed)
Signed by Harless Litten DO on 12/23/2008 at 23:32:33  Patient: Tracie Romero  Note: All result statuses are Final unless otherwise noted.    Tests: (1) Calc LDL (1058)    Order Note: FASTING STATUS:  UNKNOWN    Order Note:     CLDL                 [H]  201 mg/dL                   4-540    Note: An exclamation mark (!) indicates a result that was not dispersed into   the flowsheet.  Document Creation Date: 12/23/2008 8:22 PM  _______________________________________________________________________    (1) Order result status: Final  Collection or observation date-time: 12/23/2008 09:00  Requested date-time:   Receipt date-time: 12/23/2008 16:34  Reported date-time:   Referring Physician:    Ordering Physician: Helane Rima D.O. Juliane Poot)  Specimen Source:   Source: [name] Order Number: [id number]  Lab site:

## 2008-12-23 NOTE — Unmapped (Signed)
Signed by Harless Litten DO on 12/23/2008 at 00:00:00  Disclosure      Imported By: Evette Doffing 12/23/2008 15:14:10    _____________________________________________________________________    External Attachment:    Please see [employer] EMR for this document.

## 2008-12-24 LAB — LDL CHOLESTEROL, DIRECT: LDL Cholesterol: 201 mg/dL

## 2008-12-24 NOTE — Unmapped (Signed)
Signed by Harless Litten DO on 12/24/2008 at 00:00:00  medical services performed       Imported By: Evette Doffing 12/25/2008 06:21:26    _____________________________________________________________________    External Attachment:    Please see [facility] EMR for this document.

## 2009-02-01 NOTE — Unmapped (Signed)
Signed by Letta Pate MA on 02/01/2009 at 16:00:25    PHONE NOTE  Call back at Home Phone: [phone]  Caller: patient  Call for: dr Phoebe Sharps     Reason for Call: pt called in and she would like  to have a refill of   XANAX 1 MG TABS (ALPRAZOLAM) 1 by mouth every 8 hrs as needed nerves. pharmacy [phone] hm [phone]       Pharmacy Information: [phone]      Initial call taken by: Garnet Koyanagi,  Feb 01, 2009 3:01 PM      FOLLOW UP  OK to refill #42  Follow-up by:  Harless Litten DO,  Feb 01, 2009 3:08 PM    FOLLOW UP  rx done pt. notified.  Follow-up by:  Letta Pate MA,  Feb 01, 2009 4:00 PM    Prescriptions:  XANAX 1 MG TABS (ALPRAZOLAM) 1 by mouth every 8 hrs as needed nerves.  #42 x 0   Entered by: Letta Pate MA   Authorized by: Harless Litten DO   Signed by: Letta Pate MA on 02/01/2009   Method used: Telephoned to ...     [facility]     [address]     [place], [place]  [address]     Ph: [phone]     Fax: [phone]   RxID: [id number]

## 2009-02-10 NOTE — Unmapped (Signed)
Signed by Jackelyn Knife MA on 02/10/2009 at 16:20:00    PHONE NOTE  Call back at Home Phone: [phone]  Caller: patient  Call for: Tracie Romero    Reason for Call: wants samples of tricor 145 mg & lipitor 80 mg.      Initial call taken by: Ronelle Nigh,  Feb 10, 2009 2:26 PM    Current Allergies:   NKA      FOLLOW UP  OK to give samples if we have them.  Pt needs fasting Lipid profile, SGOT, SGPT soon.    Follow-up by:  Harless Litten DO,  Feb 10, 2009 4:09 PM    FOLLOW UP  ONLY HAD SAMPLES OF LIPITOR, WILL PUT AT FRONT DESK  left message for patient  Follow-up by:  Jackelyn Knife MA,  Feb 10, 2009 4:19 PM    Prescriptions:  LIPITOR 80 MG TABS (ATORVASTATIN CALCIUM) daily  #28 x 5   Entered by: Jackelyn Knife MA   Authorized by: Harless Litten DO   Signed by: Jackelyn Knife MA on 02/10/2009   Method used: Samples Given   RxID: [id number]

## 2009-02-12 LAB — LIPID PANEL
Chol/HDL Ratio: 7.1 — ABNORMAL HIGH (ref 0.0–4.0)
Cholesterol, Total: 255 mg/dL — ABNORMAL HIGH (ref 0–200)
HDL: 36 mg/dL — ABNORMAL LOW (ref >40)
Triglycerides: 209 mg/dL — ABNORMAL HIGH (ref 0–150)

## 2009-02-12 LAB — LDL CHOLESTEROL, DIRECT: LDL Cholesterol: 178 mg/dL

## 2009-02-12 LAB — ALT: ALT: 36 U/L (ref 10–50)

## 2009-02-12 LAB — AST: AST: 24 U/L (ref 5–34)

## 2009-02-12 LAB — LIPOPROTEIN ELECTROPHORESIS: LDL Calculated: 178 mg/dL — ABNORMAL HIGH (ref 0–100)

## 2009-02-12 NOTE — Unmapped (Signed)
Signed by Harless Litten DO on 02/12/2009 at 17:48:25    Clinical Lists Changes    Observations:  Added new observation of LDL: 178 mg/dL (09/81/1914 78:29)            Preventive Maintenance     LDL: 201 (12/23/2008 11:35:33 PM)   LDL Test Date: 02/12/2009 LDL: 178     Coordinating Care Providers   PCP Name: Ian Bushman, MD  Cardiologist: Dr. Matthias Hughs  Vascular: Dr. Colbert Ewing

## 2009-02-12 NOTE — Unmapped (Signed)
Signed by Harless Litten DO on 02/12/2009 at 17:47:55  Patient: Tracie Romero  Note: All result statuses are Final unless otherwise noted.    Tests: (1) Lipid Panel (1055)    Order Note: FASTING STATUS:  UNKNOWN    Order Note:     CHOLESTEROL          [H]  255 mg/dL                   4-540    TRIGLYCERIDES        [H]  209 mg/dL                   9-811    CHOL/HDL RATIO       [H]  7.1                         0.0-4.0    HDL                  [L]  36 mg/dL                    >91    Note: An exclamation mark (!) indicates a result that was not dispersed into   the flowsheet.  Document Creation Date: 02/12/2009 4:04 PM  _______________________________________________________________________    (1) Order result status: Final  Collection or observation date-time: 02/12/2009 12:32  Requested date-time:   Receipt date-time: 02/12/2009 12:32  Reported date-time:   Referring Physician:    Ordering Physician: Helane Rima D.O. Juliane Poot)  Specimen Source:   Source: [name] Order Number: [id number]  Lab site:

## 2009-02-12 NOTE — Unmapped (Signed)
Signed by Harless Litten DO on 02/12/2009 at 17:54:25          Earl Lites A. Phoebe Sharps, DO  [facility]  [address]  [place], [place] [address]  Phone: [phone]  Fax: [phone]     Feb 12, 2009      [facility]  [address]    [place], [place] [address]                                                                                           RE:  TEST RESULTS   (Relda Arnesen--[birth year])         Dear Ms. Olivero:      The following is an interpretation of your most recent tests.  Please take note of any instructions provided.   Liver function studies:  Normal    Lipid panel:  Abnormal          Triglyceride: 209   Cholesterol: 255   LDL: 178   LDL, calculated: 178   HDL: 36   Chol/HDL%:  7.1        Additional Comments: The goal for cholesterol is below 200.  The goal for triglyceride is below 150.  The goal for HDL (good cholesterol) is above 40.  The goal for LDL (bad cholesterol) is below 70.  For some reason, your numbers are much higher than they used to be.  We will need to continue the Lipitor 80mg  daily and the fenofibrate 160mg  daily.  I need to add a thrid medication (Zetia 10mg  daily) to help lower your LDL even more.  This is also expensive but I don't have any alternative as your numbers are not even close to goal.  Please do your best with your diet.  We will need to recheck the same labs (Lipid profile, SGOT, SGPT ) fasting in 6 more weeks.       Medications Prescribed or Changed  ZETIA 10 MG TABS (EZETIMIBE) 1 by mouth daily          Sincerely,         Earl Lites A. Deadra Diggins, D.O.

## 2009-02-12 NOTE — Unmapped (Signed)
Signed by Harless Litten DO on 02/12/2009 at 17:47:55  Patient: Tracie Romero  Note: All result statuses are Final unless otherwise noted.    Tests: (1) ALT (1260)    Order Note: FASTING STATUS:  UNKNOWN    Order Note:     ALT                       36 U/L                      10-50    Note: An exclamation mark (!) indicates a result that was not dispersed into   the flowsheet.  Document Creation Date: 02/12/2009 4:04 PM  _______________________________________________________________________    (1) Order result status: Final  Collection or observation date-time: 02/12/2009 12:32  Requested date-time:   Receipt date-time: 02/12/2009 12:32  Reported date-time:   Referring Physician:    Ordering Physician: Helane Rima D.O. Juliane Poot)  Specimen Source:   Source: [name] Order Number: [id number]  Lab site:

## 2009-02-12 NOTE — Unmapped (Signed)
Signed by Jackelyn Knife MA on 02/12/2009 at 12:32:26    PHONE NOTE  Call back at Home Phone: [phone]  Caller: patient    Reason for Call: medication issues. pt's ins has changed & she has to pay alot more for her lipitor & tricor & having trouble affording it, wanted to know if you could reccomend something cheaper ?  she was in this am for her lipid profile fyi.      Initial call taken by: Jackelyn Knife MA,  Feb 12, 2009 11:31 AM      New Medications:  FENOFIBRATE 160 MG TABS (FENOFIBRATE) 1 by mouth daily    FOLLOW UP  The lipitor is the top dose.  None of the generic medications would be strong enough to equal this.  See if we can help with samples.  Have the patient check the lipitor website for coupons or patient assistance programs.  We can change the tricor to fenofibrate 160mg  daily.  Follow-up by:  Harless Litten DO,  Feb 12, 2009 11:47 AM    FOLLOW UP  patient advised  Follow-up by:  Jackelyn Knife MA,  Feb 12, 2009 12:32 PM    Prescriptions:  FENOFIBRATE 160 MG TABS (FENOFIBRATE) 1 by mouth daily  #30 x 5   Entered by: Jackelyn Knife MA   Authorized by: Harless Litten DO   Signed by: Jackelyn Knife MA on 02/12/2009   Method used: Print then Give to Patient   RxID: [id number]  FENOFIBRATE 160 MG TABS (FENOFIBRATE) 1 by mouth daily  #30 x 5   Entered and Authorized by: Harless Litten DO   Signed by: Harless Litten DO on 02/12/2009   Method used: Historical   RxID: [id number]

## 2009-02-12 NOTE — Unmapped (Addendum)
Signed by Jackelyn Knife MA on 02/12/2009 at 10:15:45        Allergies  No Known Allergies    Medications               Assessment and Plan   Today's Orders   Lipid Profile   (FATS) (7600) [CPT-80061]  ALT    (SGPT) (823)  [*CPT-84460]  AST (SGOT) (822) Kieran[facility]              ]    Signed by Jackelyn Knife MA on 02/15/2009 at 08:17:32              Allergies  No Known Allergies    Medications               Assessment and Plan   Today's Orders   Venipuncture [[id number]]              ]

## 2009-02-12 NOTE — Unmapped (Signed)
Signed by Harless Litten DO on 02/12/2009 at 17:47:55  Patient: Tracie Romero  Note: All result statuses are Final unless otherwise noted.    Tests: (1) AST (1270)    Order Note: FASTING STATUS:  UNKNOWN    Order Note: Comments:     Confidential Patient Information: Accompanied are [facility] results that are being   delivered by [facility].  If you receive a clinical result for a patient that is not yours please   contact [facility] at [phone].    AST                       24 U/L                      5-34    Note: An exclamation mark (!) indicates a result that was not dispersed into   the flowsheet.  Document Creation Date: 02/12/2009 4:04 PM  _______________________________________________________________________    (1) Order result status: Final  Collection or observation date-time: 02/12/2009 12:32  Requested date-time:   Receipt date-time: 02/12/2009 12:32  Reported date-time:   Referring Physician:    Ordering Physician: Helane Rima D.O. Juliane Poot)  Specimen Source:   Source: [name] Order Number: [id number]  Lab site:

## 2009-02-12 NOTE — Unmapped (Signed)
Signed by Harless Litten DO on 02/12/2009 at 17:47:55  Patient: Tracie Romero  Note: All result statuses are Final unless otherwise noted.    Tests: (1) Calc LDL (1058)    Order Note: FASTING STATUS:  UNKNOWN    Order Note:     CLDL                 [H]  178 mg/dL                   1-610    Note: An exclamation mark (!) indicates a result that was not dispersed into   the flowsheet.  Document Creation Date: 02/12/2009 4:04 PM  _______________________________________________________________________    (1) Order result status: Final  Collection or observation date-time: 02/12/2009 12:32  Requested date-time:   Receipt date-time: 02/12/2009 12:32  Reported date-time:   Referring Physician:    Ordering Physician: Helane Rima D.O. Juliane Poot)  Specimen Source:   Source: [name] Order Number: [id number]  Lab site:

## 2009-02-13 NOTE — Unmapped (Signed)
Signed by Letta Pate MA on 02/13/2009 at 09:00:37    Prescriptions:  ZETIA 10 MG TABS (EZETIMIBE) 1 by mouth daily  #30 x 1   Entered by: Letta Pate MA   Authorized by: Harless Litten DO   Signed by: Letta Pate MA on 02/13/2009   Method used: Print then Give to Patient   RxID: [id number]

## 2009-03-16 NOTE — Unmapped (Signed)
Signed by Harless Litten DO on 03/16/2009 at 00:00:00  CAROTID ULTRASOUND REPORT       Imported By: Evette Doffing 03/22/2009 09:54:58    _____________________________________________________________________    External Attachment:    Please see [employer] EMR for this document.

## 2009-03-18 NOTE — Unmapped (Signed)
Signed by Letta Pate MA on 03/18/2009 at 13:19:05    PHONE NOTE  Call back at Home Phone: [phone]  Caller: patient  Call for: Dr Phoebe Sharps    Reason for Call: refill medication. Patient requesting samples of Lipitor.       Initial call taken by: Henreitta Cea,  March 18, 2009 10:16 AM      FOLLOW UP  OK, Lipitor 80mg    Follow-up by:  Harless Litten DO,  March 18, 2009 10:19 AM    FOLLOW UP  samples in pub pt. notified.  Follow-up by:  Letta Pate MA,  March 18, 2009 1:18 PM    Prescriptions:  LIPITOR 80 MG TABS (ATORVASTATIN CALCIUM) daily  #21 x 0   Entered by: Letta Pate MA   Authorized by: Harless Litten DO   Signed by: Letta Pate MA on 03/18/2009   Method used: Samples Given   RxID: [id number]

## 2009-03-23 NOTE — Unmapped (Signed)
Signed by Harless Litten DO on 03/23/2009 at 00:00:00  Cardiac       Imported By: Evette Doffing 03/24/2009 09:56:22    _____________________________________________________________________    External Attachment:    Please see [employer] EMR for this document.

## 2009-03-23 NOTE — Unmapped (Signed)
Signed by Harless Litten DO on 03/23/2009 at 16:06:35

## 2009-04-12 NOTE — Unmapped (Signed)
Signed by Letta Pate MA on 04/12/2009 at 14:53:21    PHONE NOTE  Call back at Home Phone: [phone]  Caller: patient  Call for: Dr Phoebe Sharps    Reason for Call: refill medication. Patient requesting to have samples of Tricor 85mg .      Initial call taken by: Henreitta Cea,  April 12, 2009 2:37 PM      FOLLOW UP  She is on generic fenofibrate 160mg .  If we have samples of tricor 145mg , we can give her these to take instead.   Follow-up by:  Harless Litten DO,  April 12, 2009 2:44 PM    FOLLOW UP  no samples avail c/p lmom.  Follow-up by:  Letta Pate MA,  April 12, 2009 2:53 PM

## 2009-05-31 NOTE — Unmapped (Signed)
Signed by Letta Pate MA on 05/31/2009 at 12:45:43    Prescriptions:  XANAX 1 MG TABS (ALPRAZOLAM) 1 by mouth every 8 hrs as needed nerves.  #42 x 1   Entered by: Letta Pate MA   Authorized by: Harless Litten DO   Signed by: Letta Pate MA on 05/31/2009   Method used: Telephoned to ...     [facility]     [address]     [place], [place]  [address]     Ph: [phone]     Fax: [phone]   RxID: [id number]

## 2009-07-06 NOTE — Unmapped (Signed)
Signed by Harless Litten DO on 07/06/2009 at 00:00:00  MEDICAL SERVICES       Imported By: Evette Doffing 07/12/2009 16:25:02    _____________________________________________________________________    External Attachment:    Please see [employer] EMR for this document.

## 2009-07-06 NOTE — Unmapped (Signed)
Signed by Harless Litten DO on 07/06/2009 at 13:10:29    PHONE NOTE  Caller's Cell Phone #: [phone]  Caller: patient  Call for: Dr Phoebe Sharps    Reason for Call: speak with provider. Patient having really bad headaches, and having trouble seeing. Vernona Rieger patient's daughter is concerned about her mother. Patient is refusing to go to the hospital. Patient also has not been taking her Plavix, Lipitor, and Tricor. Patient has not had the money to get these filled.         Initial call taken by: Henreitta Cea,  July 06, 2009 12:15 PM      FOLLOW UP  Headaches have been ongoing for a couple weeks. Patient has been using [facility] [phone].  Follow-up by:  Henreitta Cea,  July 06, 2009 12:17 PM    FOLLOW UP  I returned the call and spoke with Leotis Shames and the patient.  She is having constant headaches for the past 3 weeks with occassional blurred vision.  She has not been taking any of her Rx's but remains on aspirin.  I advised her to make an appointment to review her medications and see if we can help her compliance with them.  She was advised to go to ER if her symptoms become worse.   Follow-up by:  Harless Litten DO,  July 06, 2009 1:09 PM

## 2009-07-06 NOTE — Unmapped (Signed)
Signed by Harless Litten DO on 07/06/2009 at 00:00:00  Disclosure      Imported By: Evette Doffing 07/12/2009 16:24:15    _____________________________________________________________________    External Attachment:    Please see [employer] EMR for this document.

## 2009-07-06 NOTE — Unmapped (Signed)
Signed by Harless Litten DO on 07/06/2009 at 00:00:00  Privacy Notice      Imported By: Evette Doffing 07/12/2009 16:24:34    _____________________________________________________________________    External Attachment:    Please see [facility] EMR for this document.

## 2009-07-06 NOTE — Unmapped (Signed)
Signed by   [employer] on 07/07/2009 at 10:34:33  Patient: Tracie Romero  Note: All result statuses are Final unless otherwise noted.    Tests: (1)  (Emergency Room Report)    Order Note:     CHIEF COMPLAINT:  Headache.    HISTORY OF PRESENT ILLNESS:  The patient is a 48 year old female with history   of cardiovascular disease.  She has been having headaches for about two or   three weeks.  They seem to come and go, however they are there everyday and   seemed to be there for most of the day.  Tylenol does help.  When the   headaches get bad, she does have some blurry vision.  This is out of both eyes   and this is not a diplopia and there is no visual loss.  She has no focal   weakness in the arms or legs with this.  No fevers.  No nausea and no   vomiting.  She spoke with her physician, Dr. Phoebe Sharps, today and was referred   to the hospital.  She states she thinks she was concerned because she has not   taken her medicines for two or three months because of insurance changes and   cost.  Also, they are concerned about her heart, although she has had no chest   pain or shortness of breath other than the wheezing that she has had at   night.    ALLERGIES:  No known drug allergies.    CURRENT MEDICATIONS:  She is not taking any of her medications, but the   medications she has been on have been Plavix, isosorbide, folic acid, TriCor,   Lipitor and atenolol.  She is taking an aspirin however.    PAST MEDICAL HISTORY:  1.  Known history of coronary artery disease.  Her last interventional   procedure was in 05/09 by Dr. Marcelline Mates.  2.  High cholesterol.  3.  Hypertension.  4.  History of CVA and TIA couple of years ago.  She was at [facility].  She is not   sure of the extent of the workup, but she does have some carotid disease.   SOCIAL HISTORY:  The patient lives at home.    FAMILY HISTORY:  Noncontributory.    REVIEW OF SYSTEMS:  All other systems are essentially negative.  Slight cough.    No abdominal pain.   No fevers.  No sore throat. No signs of upper   respiratory tract infection.   PHYSICAL EXAMINATION:  GENERAL:  The patient is an awake, alert and pleasant female, answering   questions appropriately.  VITAL SIGNS:  Temperature 98.6, respirations 16, pulse 82 and blood pressure   162/87.  HEENT:  Extraocular muscles are intact.  Nose is midline.  Throat itself is   clear.  NECK:  Supple.  No lymphadenopathy.  No nuchal rigidity.  LUNGS:  Clear.  No wheezes at this point.  HEART:  Regular rate and rhythm.  ABDOMEN:  Soft and nontender.  No rebound, no guarding, and no rigidity.  EXTREMITIES:  Pink, warm, and well-perfused.  No cyanosis.  NEUROLOGIC:  Cranial nerves II through XII are grossly intact.  Moving all   extremities symmetrically.  She has got a negative Romberg and negative   pronator drift.  Normal finger-to-nose.  Cranial nerves II through XII are   grossly intact.  I do not appreciate any obvious visual field loss, sometime   she is little consistent  on the left side and sometimes on the right side, but   we repeated the examination and it does not appear she has any loss.  She has   got no diplopia.  Just a generalized blurriness and this has been going on   for a while.    EMERGENCY DEPARTMENT COURSE:  She has had the headache for two to three weeks   and really it is inconsistent for presentation for meningitis and makes it   less likely to be aneurysmal since it comes and goes.  Her neurological   examination is normal at this point.  Blood pressure is not that bad and she   has been off her medicines for a few weeks.  We will get a CAT scan.  I will   give her a little pain medicine and disposition her accordingly.                Order Note: Site: [facility]  [phone]  Admission Date/Time: 07/06/09   Discharge Date/Time: //   Unit #: [id number]  Location: [facility]  Account #: [id number]  Primary Insurance: Rachael Fee ACCESS PPO  Admitting Diagnosis:  HEADACHE,BLURRED VISION  Report #: [id number]      Order Note:     Order Note: _________________________________  Signed by:    Dallas Schimke MD                        SK  D:  07/06/09   T:  07/07/09 0954    This document is confidential medical information.  Unauthorized disclosure or   use of this information is prohibited by law.  If you are not the intended   recipient of this document, please advise [place] by calling immediately   [phone].    Note: An exclamation mark (!) indicates a result that was not dispersed into   the flowsheet.  Document Creation Date: 07/07/2009 10:34 AM  _______________________________________________________________________    (1) Order result status: Final  Collection or observation date-time: 07/06/2009  Requested date-time: 07/07/2009 09:54  Receipt date-time:   Reported date-time:   Referring Physician:    Ordering Physician:  Reviewed In [facility])  Specimen Source:   Source: Francesco Sor Order Number: [id number] TRANSCRIPTION  Lab site: [facility]

## 2009-07-06 NOTE — Unmapped (Signed)
Signed by   [employer] on 07/07/2009 at 10:51:16  Patient: Tracie Romero  Note: All result statuses are Final unless otherwise noted.    Tests: (1)  (Emergency Room Report)    Order Note:       ADDENDUM:    IV was established.  Labs were obtained.  She had a head CT that was read as   normal, except for right mastoiditis.  I went back and then pushed on this   area.  She is not tender there, so I really do not think it is acute and   related to her issue.  CBC was normal with a white count of 7.7, no left   shift.  Renal panel was normal as well.    She got Toradol and Compazine here as well as a little fluid and her pain   actually went away.  I talked to her about an LP, described the risks and the   benefits, which include missing something more serious, but her headache is   very atypical for subarachnoid or meningitis and she chose not too.  She is   aware that those things are life threatening.  I explained this to her, but I   certainly think it is reasonable not to be more aggressive at this point   because her headache has gone with just some simple medications and it is very   atypical for both of those.  I did speak with Dr. Kandice Hams, covering for Dr.   Phoebe Sharps since they referred her in.  I went over the findings.  She actually   already has an appointment on Friday and she is to keep that appointment.    She is going to take an aspirin for now.  We will have her take ibuprofen as   well for the headaches, give a little Tylenol with Codeine if needed.  She   does know the side effects of drowsiness.  She is to follow up with him on   Friday to go over some other medication possibilities and return if she   worsens or develops any other problems.    IMPRESSION:  Acute headache.    DISPOSITION:  The patient was discharged.                Order Note: Site: [facility]  [phone]  Admission Date/Time: 07/06/09   Discharge Date/Time: //   Unit #: [id number]  Location:  [facility]  Account #: [id number]  Primary Insurance: Rachael Fee ACCESS PPO  Admitting Diagnosis: HEADACHE,BLURRED VISION  Report #: [id number]      Order Note:     Order Note: _________________________________  Signed by:    Dallas Schimke MD                        SK  D:  07/06/09   T:  07/07/09 1011    This document is confidential medical information.  Unauthorized disclosure or   use of this information is prohibited by law.  If you are not the intended   recipient of this document, please advise [place] by calling immediately   [phone].    Note: An exclamation mark (!) indicates a result that was not dispersed into   the flowsheet.  Document Creation Date: 07/07/2009 10:51 AM  _______________________________________________________________________    (1) Order result status: Final  Collection or observation date-time: 07/06/2009  Requested date-time: 07/07/2009 10:11  Receipt date-time:  Reported date-time:   Referring Physician:    Ordering Physician:  Reviewed In [facility])  Specimen Source:   Source: Francesco Sor Order Number: [id number] TRANSCRIPTION  Lab site: [facility]

## 2009-07-08 NOTE — Unmapped (Signed)
Signed by   LinkLogic on 07/08/2009 at 07:58:42  Patient: Lucinda Gortney  Note: All result statuses are Final unless otherwise noted.    Tests: (1) Head W/O Contrast 1610960 (HDWO)    Order Note:     HEAD CT WITHOUT CONTRAST    INDICATION:  Headache.  Blurred vision.    No comparisons.    FINDINGS:  There is partial opacification of the right mastoid air cells.   Otherwise orbits and paranasal sinuses are clear. Left mastoid air cells cell.     The brain is normal in attenuation. No hemorrhage, mass, or infarct seen.  No   extraaxial collections.    IMPRESSION:    1.  Normal head CT.    2.  Right mastoid inflammation.                Order Note: Non-EMR Ordering Provider: Dallas Schimke MD    Order Note: Site: [facility] RADIOLOGY  [phone]  Rad #: [id number]  Unit #: [id number]  Location: [facility]  Account #: [id number]  Req #: [id number]  Order #: [id number]  Primary Insurance: Rachael Fee ACCESS PPO  Procedure: Head W/O Contrast [id number]\\.brxam Date/Time: 07/06/09 1925  Admitting Diagnosis: HEADACHE,BLURRED VISION  Reason for exam: headaches            Order Note:     Order Note: Dictated by:  Prudencio Burly MD  Signed by:    Prudencio Burly MD   07/08/09 3664      LF  D:  07/06/09 2031  T:  07/07/09 0715    This document is confidential medical information.  Unauthorized disclosure or   use of this information is prohibited by law.  If you are not the intended   recipient of this document, please advise [place] by calling immediately   [phone].  ! Head W/O Contrast [id number]                              Result Below...        RESULT: Impression/Conclusion below  (R)    Note: An exclamation mark (!) indicates a result that was not dispersed into   the flowsheet.  Document Creation Date: 07/08/2009 7:58 AM  _______________________________________________________________________    (1) Order result status: Final  Collection or observation date-time:   Requested date-time: 07/08/2009 07:21  Receipt date-time:  07/06/2009 19:25  Reported date-time:   Referring Physician:    Ordering Physician:  Non-EMR Physician [facility])  Specimen Source:   Source: [name] Order Number: [id number] RADIOLOGY  Lab site: [facility]

## 2009-07-09 NOTE — Unmapped (Signed)
Signed by Harless Litten DO on 07/09/2009 at 13:39:53      Reason for Visit   Chief Complaint: severe headaches, blurr vision x 2-3wks      Allergies  No Known Allergies    Medications   PLAVIX 75 MG TABS (CLOPIDOGREL BISULFATE) 1 qd  ASPIRIN 81 MG TBEC (ASPIRIN) 1 qd  FOLIC ACID 1 MG TABS (FOLIC ACID) 1 qd  ISOSORBIDE MONONITRATE CR 60 MG TB24 (ISOSORBIDE MONONITRATE) 1 qd  LIPITOR 80 MG TABS (ATORVASTATIN CALCIUM) daily  FENOFIBRATE 160 MG TABS (FENOFIBRATE) 1 by mouth daily  NITROGLYCERIN SUBL (NITROGLYCERIN SUBL) 0.4mg  as needed  ATENOLOL 25 MG TABS (ATENOLOL) one by mouth daily  XANAX 1 MG TABS (ALPRAZOLAM) 1 by mouth every 8 hrs as needed nerves.  ZETIA 10 MG TABS (EZETIMIBE) 1 by mouth daily       Vital Signs:   Ht: 61 in.  Wt: 164 lbs.      BMI: 31.10  BSA: 1.74  Wt chg (lbs): -6  BP: 130/80    Visual Exam   Corrective lenses: none    Acuity   Both: 20/50  Left: 20/50  Right: 20/50    History of Present Illness   Chief Complaint: Headaches  Onset 3 weeks ago with daily right sided headache from the frontal to occipital area.  The pain will occasionally throb.  Pt has on one occasion noticed some blurred vision.  She has some phonophobia but no photophobia.  She denies nausea.  Pt drinks 2 caffeinated beverages and seldom has alcohol.  Pt will still smoke on occasion (maybe 1 cig per week).  Pt takes tylenol and states that it helps temporarily.  She previously had headache trouble when she was having TIA's but denies those symptoms now.  Pt went to ER on 07-07-09 and had a CT scan which only showed some right mastoid air cell opacification.   She was Rx'd tylenol with codeine which helped with the headache.   She denies a family history of migraines.  Pt has been off of her regular meds for the past 3 months but has been taking aspirin.  Pt has been having trouble affording her medication.      Hyperlipidemia       Current Status: Unchanged       Comments: Pt has been off of her medications and unable to  afford them.    Hypertension       Current Status: Stable       Home BP Monitoring: No       Compliance with treatment: Poor       Medication Tolerance: Good      Review of Systems  Neurologic: Complains of headache.       Physical Examination:   BP: 130/  80   Blood Pressure updated by physician.    Additional Vitals:   BP: 150 / 90    Physical Exam- Detail:   General Appearance: well-developed, well-nourished and in no acute distress.  Ears: No lesions.  Tympanic membranes translucent, non-bulging.  Canal walls pink, without discharge.  Hearing grossly intact.  Oropharynx: Normal appearance.  No erythema, exudate or mass. No tonsillar swelling.  Respiratory: Respiration un-labored.  Lung fields clear to auscultation.  No wheezing, rales, rhonchi or pleural rub.  Neck: No thyromegaly.  No nodules, masses or tenderness.  Lymphatic: Areas palpated not enlarged:  cervical, supraclavicular.  Cardiac: S1 and S2 normal.  RRR without murmurs, rubs, gallops.  No JVD.  Vascular: Bilateral carotid bruit.    Neurologic: PERLA           New Problems:  HEADACHE, TENSION (ICD-307.81)  New Medications:  FIORICET 50-325-40 MG TABS (BUTALBITAL-APAP-CAFFEINE) 1 by mouth every 4 hrs as needed for headache      Preventive Maintenance       Coordinating Care Providers   PCP Name: Ian Bushman, MD  Cardiologist: Dr. Matthias Hughs  Vascular: Dr. Colbert Ewing            Prescriptions:  Prudy Feeler 1 MG TABS (ALPRAZOLAM) 1 by mouth every 8 hrs as needed nerves.  #60 x 0   Entered and Authorized by: Harless Litten DO   Signed by: Harless Litten DO on 07/09/2009   Method used: Print then Give to Patient   RxID: [id number]  ZETIA 10 MG TABS (EZETIMIBE) 1 by mouth daily  #90 x 1   Entered and Authorized by: Harless Litten DO   Signed by: Harless Litten DO on 07/09/2009   Method used: Print then Give to Patient   RxID: [id number]  ATENOLOL 25 MG TABS (ATENOLOL) one by mouth daily  #90 x 1   Entered and Authorized  by: Harless Litten DO   Signed by: Harless Litten DO on 07/09/2009   Method used: Print then Give to Patient   RxID: [id number]  FENOFIBRATE 160 MG TABS (FENOFIBRATE) 1 by mouth daily  #90 x 1   Entered and Authorized by: Harless Litten DO   Signed by: Harless Litten DO on 07/09/2009   Method used: Print then Give to Patient   RxID: [id number]  LIPITOR 80 MG TABS (ATORVASTATIN CALCIUM) daily  #90 x 1   Entered and Authorized by: Harless Litten DO   Signed by: Harless Litten DO on 07/09/2009   Method used: Print then Give to Patient   RxID: [id number]  ISOSORBIDE MONONITRATE CR 60 MG TB24 (ISOSORBIDE MONONITRATE) 1 qd  #90 x 1   Entered and Authorized by: Harless Litten DO   Signed by: Harless Litten DO on 07/09/2009   Method used: Print then Give to Patient   RxID: [id number]  FOLIC ACID 1 MG TABS (FOLIC ACID) 1 qd  #90 x 1   Entered and Authorized by: Harless Litten DO   Signed by: Harless Litten DO on 07/09/2009   Method used: Print then Give to Patient   RxID: [id number]  PLAVIX 75 MG TABS (CLOPIDOGREL BISULFATE) 1 qd  #90 x 1   Entered and Authorized by: Harless Litten DO   Signed by: Harless Litten DO on 07/09/2009   Method used: Print then Give to Patient   RxID: [id number]  FIORICET 50-325-40 MG TABS (BUTALBITAL-APAP-CAFFEINE) 1 by mouth every 4 hrs as needed for headache  #40 x 0   Entered and Authorized by: Harless Litten DO   Signed by: Harless Litten DO on 07/09/2009   Method used: Print then Give to Patient   RxID: [id number]      Assessment and Plan     Problems   Status of Existing Problems:  Assessed HEADACHE, TENSION as new - Rx fioricet  #30  1 by mouth every 4 hrs as needed for headache / will have patient resume her usual medications  - Harless Litten DO  New Problems:  Dx of HEADACHE, TENSION (ICD-307.81)  Onset: 07/09/2009    Medications   New Prescriptions/Refills:  XANAX 1 MG  TABS (ALPRAZOLAM) 1 by mouth every 8 hrs as needed nerves.  #60 x 0, 07/09/2009, Earl Lites A Quanah Majka DO  ZETIA 10 MG TABS (EZETIMIBE) 1 by mouth daily  #90 x 1, 07/09/2009, Lucilla Edin Alyn Jurney DO  ATENOLOL 25 MG TABS (ATENOLOL) one by mouth daily  #90 x 1, 07/09/2009, Earl Lites A Simya Tercero DO  FENOFIBRATE 160 MG TABS (FENOFIBRATE) 1 by mouth daily  #90 x 1, 07/09/2009, Lucilla Edin Teneisha Gignac DO  LIPITOR 80 MG TABS (ATORVASTATIN CALCIUM) daily  #90 x 1, 07/09/2009, Earl Lites A Zavon Hyson DO  ISOSORBIDE MONONITRATE CR 60 MG TB24 (ISOSORBIDE MONONITRATE) 1 qd  #90 x 1, 07/09/2009, Earl Lites A Raiven Belizaire DO  FOLIC ACID 1 MG TABS (FOLIC ACID) 1 qd  #90 x 1, 95/28/4132, Earl Lites A Hanaan Gancarz DO  PLAVIX 75 MG TABS (CLOPIDOGREL BISULFATE) 1 qd  #90 x 1, 07/09/2009, Lucilla Edin Zackarie Chason DO  FIORICET 50-325-40 MG TABS [facility]) 1 by mouth every 4 hrs as needed for headache  #40 x 0, 07/09/2009, Lucilla Edin Nylen Creque DO    Today's Orders   [phone] - Ofc Vst, Est Level III [UVO-53664]    Disposition:   Return to clinic for Doctor Visit in 6 month(s)   Return to clinic for Lab in 6 week(s)   Appointment Reason: Lipid profile, SGOT, SGPT

## 2009-07-13 NOTE — Unmapped (Signed)
Signed by Milta Deiters MA on 07/13/2009 at 15:41:23    PHONE NOTE  Call back at Home Phone: [phone]  Caller: patient  Department: Family Medicine  Call for: [facility]    Reason for Call: PATIENT WAS PRESCRIBED BUTALBITAL/APAP  FROM DR Phoebe Sharps FOR HEADACHES BUT IT HASN,T HELPED AND SHE CAN'T SLEEP. SHE WOULD LIKE SOMETHING CALLED IN     Pharmacy Information: [phone]      Initial call taken by: Mervin Kung,  July 13, 2009 1:55 PM      New Medications:  ULTRAM 50 MG TABS (TRAMADOL HCL) 1 three times a day as needed pain    FOLLOW UP  patient advised, Rx completed  Follow-up by:  Milta Deiters MA,  July 13, 2009 3:41 PM    Prescriptions:  ULTRAM 50 MG TABS (TRAMADOL HCL) 1 three times a day as needed pain  #30 x 1   Entered and Authorized by: Lynwood Dawley DO   Signed by: Lynwood Dawley DO on 07/13/2009   Method used: Print then Give to Patient   RxID: [id number]

## 2009-07-20 NOTE — Unmapped (Signed)
Signed by Harless Litten DO on 07/20/2009 at 16:58:05    PHONE NOTE  Call back at Home Phone: [phone]  Call for: dr. Phoebe Sharps    PHONE NOTE  Call placed to: Patient  Summary of call: spoke with pt. and notified her disability form is suppose to be completed by patient states she have to go over several dates to discuss why she was hospitalize and also the reason on why she can not return to work please call patient.  Call placed by: Letta Pate MA,  July 20, 2009 9:51 AM      FOLLOW UP  I spoke with patient and completed her form for her and she will pick up tomorrow.   Follow-up by:  Harless Litten DO,  July 20, 2009 4:58 PM

## 2009-08-16 NOTE — Unmapped (Signed)
Signed by Letta Pate MA on 08/16/2009 at 14:04:22    Prescriptions:  XANAX 1 MG TABS (ALPRAZOLAM) 1 by mouth every 8 hrs as needed nerves.  #60 x 1   Entered by: Letta Pate MA   Authorized by: Harless Litten DO   Signed by: Letta Pate MA on 08/16/2009   Method used: Telephoned to ...     [facility]     [address]     [place], [place]  [address]     Ph: [phone]     Fax: [phone]   RxID: [id number]

## 2009-09-01 NOTE — Unmapped (Signed)
Signed by Letta Pate MA on 09/01/2009 at 13:18:03    PHONE NOTE  Call back at Home Phone: [phone]  Caller: patient  Call for: Dr Phoebe Sharps    Reason for Call: refill medication. Patient requesting samples of the below medication. Patient also needs Tricor 140 mg samples    Current Meds:   PLAVIX 75 MG TABS (CLOPIDOGREL BISULFATE) 1 qd    LIPITOR 80 MG TABS (ATORVASTATIN CALCIUM) daily        Initial call taken by: Henreitta Cea,  September 01, 2009 11:24 AM      FOLLOW UP  She should also be on Zetia.  She was due to have lipid recheck (Lipid profile, SGOT, SGPT ) in July.  This needs to be done.  OK to give a few samples.    Follow-up by:  Harless Litten DO,  September 01, 2009 11:30 AM    FOLLOW UP  spoke with pt. states she have been taking zetia states she just got out the hospital from an hysterectomy and once she feel up to it she will sched. an lab appt.  Follow-up by:  Letta Pate MA,  September 01, 2009 1:17 PM    Prescriptions:  LIPITOR 80 MG TABS (ATORVASTATIN CALCIUM) daily  #28 x 0   Entered by: Letta Pate MA   Authorized by: Harless Litten DO   Signed by: Letta Pate MA on 09/01/2009   Method used: Samples Given   RxID: [id number]

## 2009-09-02 NOTE — Unmapped (Signed)
Signed by Harless Litten DO on 09/02/2009 at 00:00:00  [facility]       Imported By: Evette Doffing 09/08/2009 15:30:35    _____________________________________________________________________    External Attachment:    Please see [employer] EMR for this document.

## 2009-09-06 NOTE — Unmapped (Signed)
Signed by Harless Litten DO on 09/06/2009 at 08:36:47    PAST HISTORY  Surgical History:  Coronary Stent: x7, 1 stent in left arm, Hysteroscopy: Total with salpingo-oophorectomy 08-31-09, Arthroscopic Knee Surgery: Right, Iliac Stent: Right 02-21-08, TIA  Angioplasty + stent 1/03  Right hand mass excision 5/08

## 2009-09-07 LAB — LIPID PANEL
Chol/HDL Ratio: 6.1 — ABNORMAL HIGH (ref 0.0–4.0)
Cholesterol, Total: 200 mg/dL (ref 0–200)
HDL: 33 mg/dL — ABNORMAL LOW (ref >40)
Triglycerides: 230 mg/dL — ABNORMAL HIGH (ref 0–150)

## 2009-09-07 LAB — LIPOPROTEIN ELECTROPHORESIS: LDL Calculated: 121 mg/dL — ABNORMAL HIGH (ref 0–100)

## 2009-09-07 LAB — ALT: ALT: 32 U/L (ref 10–50)

## 2009-09-07 LAB — AST: AST: 26 U/L (ref 5–34)

## 2009-09-07 NOTE — Unmapped (Signed)
Signed by   LinkLogic on 09/07/2009 at 20:18:34  Patient: Tracie Romero  Note: All result statuses are Final unless otherwise noted.    Tests: (1) DIAG-PORTABLE CHEST (161096)    Order Note: CLINICAL INDICATION: Numbness.    TECHNIQUE: Portable upright AP chest radiograph is compared to December 31, 2008.    FINDINGS: The lungs are clear. There is no pleural effusion or pneumothorax.   Cardiomediastinal contours are normal.    IMPRESSION:    IMPRESSION: No acute cardiopulmonary process.      Order Note: Non-EMR Ordering Provider: Theophilus Bones    Order Note:     Order Note:     Order Note:     This document is confidential medical information.  Unauthorized disclosure or   use of this information is prohibited by law.  If you are not the intended   recipient of this document, please advise [place] by calling immediately   [phone].  ! DIAG-PORTABLE CHEST       Result Below...        RESULT: Impression/Conclusion below  (R)    Note: An exclamation mark (!) indicates a result that was not dispersed into   the flowsheet.  Document Creation Date: 09/07/2009 8:18 PM  _______________________________________________________________________    (1) Order result status: Final  Collection or observation date-time: 09/07/2009 19:31  Requested date-time: 09/07/2009 19:31  Receipt date-time:   Reported date-time:   Referring Physician:    Ordering Physician:  Non-EMR Physician [facility])  Specimen Source:   Source: [name] Order Number: [id number] RADIOLOGY  Lab site:

## 2009-09-07 NOTE — Unmapped (Signed)
Signed by Harless Litten DO on 09/07/2009 at 23:19:50  Patient: Tracie Romero  Note: All result statuses are Final unless otherwise noted.    Tests: (1) AST (1270)    Order Note: FASTING STATUS:  UNKNOWN    Order Note: Comments:     Confidential Patient Information: Accompanied are [facility] results that are being   delivered by [facility].  If you receive a clinical result for a patient that is not yours please   contact [facility] at [phone].    AST                       26 U/L                      5-34    Note: An exclamation mark (!) indicates a result that was not dispersed into   the flowsheet.  Document Creation Date: 09/07/2009 7:38 PM  _______________________________________________________________________    (1) Order result status: Final  Collection or observation date-time: 09/07/2009 09:55  Requested date-time:   Receipt date-time: 09/07/2009 12:38  Reported date-time:   Referring Physician:    Ordering Physician: Helane Rima D.O. Juliane Poot)  Specimen Source:   Source: [name] Order Number: [id number]  Lab site:

## 2009-09-07 NOTE — Unmapped (Signed)
Signed by Harless Litten DO on 09/07/2009 at 23:19:50  Patient: Tracie Romero  Note: All result statuses are Final unless otherwise noted.    Tests: (1) ALT (1260)    Order Note: FASTING STATUS:  UNKNOWN    Order Note:     ALT                       32 U/L                      10-50    Note: An exclamation mark (!) indicates a result that was not dispersed into   the flowsheet.  Document Creation Date: 09/07/2009 7:38 PM  _______________________________________________________________________    (1) Order result status: Final  Collection or observation date-time: 09/07/2009 09:55  Requested date-time:   Receipt date-time: 09/07/2009 12:38  Reported date-time:   Referring Physician:    Ordering Physician: Helane Rima D.O. Juliane Poot)  Specimen Source:   Source: [name] Order Number: [id number]  Lab site:

## 2009-09-07 NOTE — Unmapped (Signed)
Signed by Harless Litten DO on 09/07/2009 at 23:21:27    Clinical Lists Changes    Observations:  Added new observation of LDL: 121 mg/dL (13/05/6577 46:96)              Preventive Maintenance     LDL: 178 (02/12/2009 5:48:25 PM)   LDL Test Date: 09/07/2009 LDL: 121     Coordinating Care Providers   PCP Name: Ian Bushman, MD  Cardiologist: Dr. Matthias Hughs  Vascular: Dr. Colbert Ewing

## 2009-09-07 NOTE — Unmapped (Signed)
Signed by Harless Litten DO on 09/08/2009 at 09:59:51          Earl Lites A. Phoebe Sharps, DO  [facility]  [address]  [place], [place] [address]  Phone: [phone]  Fax: [phone]     September 08, 2009      [facility] [place] [facility]  [address]    [place], [place] [address]                                                                                           RE:  TEST RESULTS   (Tracie Romero)         Dear [name]:      The following is an interpretation of your most recent tests.  Please take note of any instructions provided.   Liver function studies:  Normal    Lipid panel:  Improved, Abnormal          Triglyceride: 230   Cholesterol: 200   LDL: 121   LDL, calculated: 121   HDL: 33   Chol/HDL%:  6.1        Additional Comments: The goal for cholesterol is below 200.  The goal for triglyceride is below 150.  The goal for HDL (good cholesterol) is above 40.  The goal for LDL (bad cholesterol) is below 70.  Your numbers have improved but are still not close to our goal to prevent further atery clogging.  I would like to refer you to a lipid specialist.  Please call Dr. Larena Glassman at [phone] to make an appointment.  They will request your labs from [place] and we will fax them.           Sincerely,         Lucilla Edin. Justyn Langham, D.O.

## 2009-09-07 NOTE — Unmapped (Signed)
Signed by Harless Litten DO on 09/07/2009 at 23:19:50  Patient: Tracie Romero  Note: All result statuses are Final unless otherwise noted.    Tests: (1) Calc LDL (1058)    Order Note: FASTING STATUS:  UNKNOWN    Order Note:     CLDL                 [H]  121 mg/dL                   0-981    Note: An exclamation mark (!) indicates a result that was not dispersed into   the flowsheet.  Document Creation Date: 09/07/2009 7:38 PM  _______________________________________________________________________    (1) Order result status: Final  Collection or observation date-time: 09/07/2009 09:55  Requested date-time:   Receipt date-time: 09/07/2009 12:38  Reported date-time:   Referring Physician:    Ordering Physician: Helane Rima D.O. Juliane Poot)  Specimen Source:   Source: [name] Order Number: [id number]  Lab site:

## 2009-09-07 NOTE — Unmapped (Signed)
Signed by   LinkLogic on 09/07/2009 at 20:18:35  Patient: Tracie Romero  Note: All result statuses are Final unless otherwise noted.    Tests: (1) CT-HEAD W/O CONTRAST (1610960)    Order Note: CLINICAL INDICATION: Left-sided numbness, difficulty speaking.    TECHNIQUE: Multidetector CT acquisition was performed through the head without   intravenous contrast.    COMPARISON: August 14, 2007    FINDINGS: There is no acute intracranial abnormality. Specifically, there is   no acute hemorrhage, territorial infarction, mass effect or midline shift.   Ventricles and sulci are symmetric and age-appropriate. Visualized paranasal   sinuses are clear and there are no acute, displaced fractures.   IMPRESSION:    IMPRESSION: No acute intracranial abnormality.      Order Note: Non-EMR Ordering Provider: Theophilus Bones    Order Note:     Order Note:     Order Note:     This document is confidential medical information.  Unauthorized disclosure or   use of this information is prohibited by law.  If you are not the intended   recipient of this document, please advise [place] by calling immediately   [phone].  ! CT-HEAD W/O CONTRAST      Result Below...        RESULT: Impression/Conclusion below  (R)    Note: An exclamation mark (!) indicates a result that was not dispersed into   the flowsheet.  Document Creation Date: 09/07/2009 8:18 PM  _______________________________________________________________________    (1) Order result status: Final  Collection or observation date-time: 09/07/2009 19:31  Requested date-time: 09/07/2009 19:31  Receipt date-time:   Reported date-time:   Referring Physician:    Ordering Physician:  Non-EMR Physician [facility])  Specimen Source:   Source: [name] Order Number: [id number] RADIOLOGY  Lab site:

## 2009-09-07 NOTE — Unmapped (Signed)
Signed by Letta Pate MA on 09/07/2009 at 09:54:42        Allergies  No Known Allergies    Medications               Assessment and Plan   Today's Orders   Lipid Profile   (FATS) (7600) [CPT-80061]  ALT    (SGPT) (823)  [*CPT-84460]  AST (SGOT) (822) [CPT-84450]  Venipuncture [CPT-36415]

## 2009-09-07 NOTE — Unmapped (Signed)
Signed by Harless Litten DO on 09/07/2009 at 23:19:50  Patient: Tracie Romero  Note: All result statuses are Final unless otherwise noted.    Tests: (1) Lipid Panel (1055)    Order Note: FASTING STATUS:  UNKNOWN    Order Note:     CHOLESTEROL               200 mg/dL                   1-478    TRIGLYCERIDES        [H]  230 mg/dL                   2-956    CHOL/HDL RATIO       [H]  6.1                         0.0-4.0    HDL                  [L]  33 mg/dL                    >21    Note: An exclamation mark (!) indicates a result that was not dispersed into   the flowsheet.  Document Creation Date: 09/07/2009 7:38 PM  _______________________________________________________________________    (1) Order result status: Final  Collection or observation date-time: 09/07/2009 09:55  Requested date-time:   Receipt date-time: 09/07/2009 12:38  Reported date-time:   Referring Physician:    Ordering Physician: Helane Rima D.O. Juliane Poot)  Specimen Source:   Source: [name] Order Number: [id number]  Lab site:

## 2009-09-08 LAB — LDL CHOLESTEROL, DIRECT: LDL Cholesterol: 121 mg/dL

## 2009-09-09 NOTE — Unmapped (Signed)
Signed by Harless Litten DO on 09/09/2009 at 00:00:00  [facility]       Imported By: Evette Doffing 09/10/2009 16:07:12    _____________________________________________________________________    External Attachment:    Please see [employer] EMR for this document.

## 2009-09-09 NOTE — Unmapped (Signed)
Signed by Harless Litten DO on 09/09/2009 at 00:00:00  Emergency Report      Imported By: Evette Doffing 09/10/2009 15:15:38    _____________________________________________________________________    External Attachment:    Please see [employer] EMR for this document.

## 2009-09-21 NOTE — Unmapped (Signed)
Signed by Letta Pate MA on 09/21/2009 at 14:08:26    Prescriptions:  XANAX 1 MG TABS (ALPRAZOLAM) 1 by mouth every 8 hrs as needed nerves.  #60 x 1   Entered by: Letta Pate MA   Authorized by: Harless Litten DO   Signed by: Letta Pate MA on 09/21/2009   Method used: Telephoned to ...     [facility]     [address]     [place], [place]  [address]     Ph: [phone]     Fax: [phone]   RxID: [id number]

## 2009-09-21 NOTE — Unmapped (Signed)
Signed by Letta Pate MA on 09/22/2009 at 09:55:37    PHONE NOTE  Caller: patient  Department: Family Medicine  Call for: dr Phoebe Sharps    Reason for Call: pt needs a refill on her xanax 1mg  also she needs samples of plavix 75mg . please give pt a call when  complete [phone]. pharmacy [phone]      Initial call taken by: Vedia Pereyra,  September 21, 2009 4:22 PM      FOLLOW UP  The xanax was approved by fax.  OK to give samples of plavix.  Follow-up by:  Harless Litten DO,  September 21, 2009 10:54 PM    FOLLOW UP  pt. notified.  Follow-up by:  Letta Pate MA,  September 22, 2009 9:55 AM

## 2009-09-22 NOTE — Unmapped (Signed)
Signed by Letta Pate MA on 09/22/2009 at 15:23:01    Prescriptions:  XANAX 1 MG TABS (ALPRAZOLAM) 1 by mouth every 8 hrs as needed nerves.  #60 x 0   Entered by: Letta Pate MA   Authorized by: Harless Litten DO   Signed by: Letta Pate MA on 09/22/2009   Method used: Telephoned to ...     [facility]     [address]     [place], [place]  [address]     Ph: [phone]     Fax: [phone]   RxID: [id number]

## 2009-09-29 NOTE — Unmapped (Signed)
Signed by Letta Pate MA on 09/30/2009 at 09:06:21    PHONE NOTE  Call back at Home Phone: [phone]  Caller: patient  Call for: Dr Phoebe Sharps    Reason for Call: refill medication. Patient requesting samples of Plavix, Zetia, Tricor, and Lipitor? Patient is aware that Dr Phoebe Sharps would be back tomorrow.      Initial call taken by: Henreitta Cea,  September 29, 2009 11:33 AM      FOLLOW UP  OK to give samples.  Make sure she has contacted Dr. Shirline Frees at the lipid clinic for further evaluation.    Follow-up by:  Harless Litten DO,  September 29, 2009 3:38 PM    FOLLOW UP  samples in pub pt. notified.  Follow-up by:  Letta Pate MA,  September 30, 2009 9:06 AM    Prescriptions:  LIPITOR 80 MG TABS (ATORVASTATIN CALCIUM) daily  #14 x 0   Entered by: Letta Pate MA   Authorized by: Harless Litten DO   Signed by: Letta Pate MA on 09/30/2009   Method used: Samples Given   RxID: [id number]

## 2009-10-21 NOTE — Unmapped (Signed)
Signed by Harless Litten DO on 10/21/2009 at 00:00:00  Sleep Study      Imported By: Evette Doffing 10/27/2009 17:04:17    _____________________________________________________________________    External Attachment:    Please see [employer] EMR for this document.

## 2009-10-26 NOTE — Unmapped (Signed)
Signed by Letta Pate MA on 10/26/2009 at 14:55:00    Prescriptions:  XANAX 1 MG TABS (ALPRAZOLAM) 1 by mouth every 8 hrs as needed nerves.  #60 x 0   Entered by: Letta Pate MA   Authorized by: Harless Litten DO   Signed by: Letta Pate MA on 10/26/2009   Method used: Telephoned to ...     [facility] (retail)     [address]     [place], [place]  [address]     Ph: [phone]     Fax: [phone]   RxID: [id number]

## 2010-11-07 NOTE — Unmapped (Signed)
Signed by Jae Dire MD on 11/07/2010 at 00:00:00  Plastic Surgery Referral      Imported By: Coletta Memos 11/10/2010 12:37:08    _____________________________________________________________________    External Attachment:    Please see [facility] EMR for this document.

## 2010-11-07 NOTE — Unmapped (Signed)
Signed by Jae Dire MD on 11/07/2010 at 00:00:00  Wound Care Medical History      Imported By: Coletta Memos 11/10/2010 12:40:31    _____________________________________________________________________    External Attachment:    Please see [facility] EMR for this document.

## 2010-11-07 NOTE — Unmapped (Signed)
Signed by Jae Dire MD on 11/07/2010 at 22:58:56    Plastic Surgery Office Visit      History of Present Illness     Lesion 1  How was it discovered? Cardiologist.  Location:  Nose.  Duration: one year(s)  Symptoms: changing size  Comment: time in 1430  time out 1450  C / O LEFT nasal ala lesion.  No history of pain, hemorrhage, nor previous treatment.  No history of fever, chills, night sweats, decreased appetite, nor unplanned weight loss.          PAST HISTORY  Past Medical History (reviewed - no changes required):  Coronary Artery Disease, Hyperlipidemia, Hypertension, Depression, Stroke, Transcient Ischemic Attack (TIA), hemorrhoids  anxiety  Carotid bruits  Marijuana use  Surgical History (reviewed - no changes required):  Coronary Stent: x7, 1 stent in left arm, Hysteroscopy: Total with salpingo-oophorectomy 08-31-09, Arthroscopic Knee Surgery: Right, Iliac Stent: Right 02-21-08, TIA  Angioplasty + stent 1/03  Right hand mass excision 5/08    Family History (reviewed - no changes required): HTN, hyperlipidemia, heart disease, diabetes, cancer  GF- Lung Ca  Sister - Epilepsy  Brother - MI  Social History (reviewed - no changes required): Marital Status: married,   Children: 2,   Employment Status: disabled,   Patient Lives at: home,   Support System: excellent  Caffeine per Day: 4+  Seatbelt Use: 100 % of the time  Alcohol Use: none  Drug Use: none  Tobacco Usage:smoker  Cigarettes-Year Started: 1969,   Cigarettes-Years smoked- 40,   Cigarettes-Packs per Day- 1 cig per week,   Pack/Years- 0    REVIEW OF SYSTEMS  General: Denies fevers, chills, sweats, anorexia, fatigue, malaise, weight loss, weight gain.   Eyes: Denies blurring, diplopia, irritation, discharge, vision loss, eye pain, photophobia, redness.   Ears/Nose/Throat: Denies decreased hearing, dysphagia, ear discharge, earache, facial pain, headaches, hoarseness, mouth ulcers/sores, nasal congestion, nasal ulcers/sores, nosebleeds, post nasal  drip, rhinorrhea, sinus pain, sinusitis, sneezing, sore throat, tinnitus.   Cardiovascular: Denies chest pains, dizziness, dyspnea on exertion, dyspnea at rest, palpitations, peripheral edema, PND,  presyncope, orthopnea, syncope.   Respiratory: Denies dyspnea, tachypnea, excessive sputum, hemoptysis, wheezing, productive cough, dry cough, choking, clubbing, sputum, stridor, exercise limitation, chest wall deformity, frequent throat clearing, chest pain, cyanosis, nocturnal cough, exercise-induced cough.   Gastrointestinal: Denies nausea, vomiting, diarrhea, constipation, change in bowel habits, abdominal pain, melena, hematochezia, jaundice, spitting, encopresis, hematemesis, abdominal distention, edema, ascites.   Genitourinary: Denies vaginal discharge, incontinence, day time wetting, dysuria, hematuria, urinary frequency, amenorrhea, menorrhagia, abnormal vaginal bleeding, pelvic pain, nocturia, flank pain, weak stream, precipitance, incomplete empty, retention, enuresis, perineal rash, tea-colored urine.   Musculoskeletal: Denies arthritis, extreme deformity, back pain, neck pain, joint pain, joint swelling, joint redness, muscle cramps, muscle weakness, muscle pains, stiffness, warmth, limp.   Skin: Complains of suspicious lesions. Denies hair loss, rash, skin color change, birthmarks, growths, dry skin, moles, warts, bites, pruritus, acne, nail disease, petechia, purpura.   Neurologic: Denies headache, transient paralysis, weakness, paresthesias, seizures, syncope, tremors, vertigo, poor coordination.   Psychiatric: Denies behavioral changes, depression, anxiety, memory loss, mental disturbance, suicidal ideation, homicidal ideation, hallucinations, paranoia, learning disability, hyperactivity.   Endocrine: Denies cold intolerance, heat intolerance, polydipsia, polyphagia, polyuria, weight change, growth problems.   Heme/Lymphatic: Denies bone pain, abnormal bruising,  bleeding, enlarged lymph nodes, node  tenderness, pallor.   Allergic/Immunologic: Denies hay fever, HIV exposure, itching eyes, nasal congestion, persistent infections, seasonal allergy symptoms, urticaria.  Allergies  No Known Allergies    Medications   PLAVIX 75 MG TABS (CLOPIDOGREL BISULFATE) 1 qd  ASPIRIN 81 MG TBEC (ASPIRIN) 1 qd  FOLIC ACID 1 MG TABS (FOLIC ACID) 1 qd  ISOSORBIDE MONONITRATE CR 60 MG TB24 (ISOSORBIDE MONONITRATE) 1 qd  LIPITOR 80 MG TABS (ATORVASTATIN CALCIUM) daily  FENOFIBRATE 160 MG TABS (FENOFIBRATE) 1 by mouth daily  NITROGLYCERIN SUBL (NITROGLYCERIN SUBL) 0.4mg  as needed  ATENOLOL 25 MG TABS (ATENOLOL) one by mouth daily  XANAX 1 MG TABS (ALPRAZOLAM) 1 by mouth every 8 hrs as needed nerves.  ZETIA 10 MG TABS (EZETIMIBE) 1 by mouth daily  FIORICET 50-325-40 MG TABS (BUTALBITAL-APAP-CAFFEINE) 1 by mouth every 4 hrs as needed for headache  ULTRAM 50 MG TABS (TRAMADOL HCL) 1 three times a day as needed pain    Vital Signs   Height: 61 in.    Weight: 165 lbs.   BMI (in-lb): 31.29   Respirations: 14       Screening for unhealthy alcohol use performed.  Women (Any Age) or Men (over Age 69): less than 3 drinks per occasion    Depression Screening:     During the past month,   Have you often been bothered by feeling down, depressed or hopeless? Patient declined  Have you often been bothered by little interest or pleasure in doing things? Patient declined    Physical Examination  Constitutional: well groomed  appropriate for age    Skin: normal color, no rashes, no unusual bruising.     Lesion 1 Location: nose.     Size (in mm): 10.     Comments: cavitary, mobile, non-tender.   Neuro: cranial nerves grossly intact, sensation intact.   Psych: affect and mood appropriate, normal interaction. oriented to person, place time, and circumstance  good eye contact        Assessment and Plan   Patient's husband has to quit smoking before surgery can be considered due heavy second hand nicotine exposure.  Explained at great length to  patient.  RTO to schedule.      Plan    Orders for today's visit  1. Ordered [phone] - Patient Encounter Documented Using an EHR Certified by [employer] [*CPT-G8447]  2. Ordered N5621 - Current Medications  NOT Documented, Reason Not  Specified [*CPT-G8428]  3. Ordered 3016F - Unhealthy Alcohol Use Screening Performed [*CPT-3016F]  4. Ordered 30865 - Ofc Vst, New Level III [CPT-99203]    Instructions for today's visit  Medical Decision Making:  Moderate Complexity with surgical and non-surgical therapies and anesthetic considerations, records are reviewed, and morbidity and mortality risk is moderate.  Nature of Presenting Problem:  ModerateSeverity  D. V. T. / V. T. E. risk moderate.  No chemical nor mechanical prophylaxis indicated in office today.      Additional Plan  codes used 99203, 238.2,272.4, v17.49, v85.31             cc:   Ian Bushman, MD            ]

## 2010-11-07 NOTE — Unmapped (Signed)
Signed by Jae Dire MD on 11/07/2010 at 00:00:00  Plastic Surgery Photo Consent      Imported By: Coletta Memos 11/10/2010 12:36:49    _____________________________________________________________________    External Attachment:    Please see [employer] EMR for this document.

## 2010-11-08 NOTE — Unmapped (Signed)
Signed by Derrek Gu on 11/08/2010 at [facility]                   [facility]         Plastic Surgery         [address]         [place], [place] [address]         p [phone] f [phone]         [email or web address]            November 07, 2010          RE: Tracie Romero  DOB:   1961-02-25    Dear Tracie Romero,    It was a pleasure seeing your patient, Tracie Romero, at your request for surgical evaluation. Please see my Assessment and Plan below.  My complete office note is available at your request.    Assessment and Plan  Patient's husband has to quit smoking before surgery can be considered due heavy second hand nicotine exposure.  Explained at great length to patient.  RTO to schedule.    I would like to thank you for allowing me to see this patient.  I will await further requests for any future opinions or care of this patient.      Sincerely,      Bracken Moffa Mariane Baumgarten, MD

## 2011-01-25 LAB — COMPREHENSIVE METABOLIC PANEL
A/G Ratio: 0.9 (ref 1.1–2.2)
ALT: 28 units/L (ref 10–40)
AST: 39 units/L (ref 15–37)
Albumin: 3.9 g/dL (ref 3.4–5.0)
Alkaline Phosphatase: 87 units/L (ref 45–129)
BUN: 13 mg/dL (ref 7–18)
CO2: 28 mmol/L (ref 21–32)
Calcium: 9.2 mg/dL (ref 8.3–10.6)
Chloride: 109 mmol/L (ref 99–110)
Creatinine: 0.8 mg/dL (ref 0.6–1.1)
GFR MDRD Af Amer: >60 mL/min/{1.73_m2}
GFR MDRD Non Af Amer: >60 mL/min (ref >60)
Glucose: 88 mg/dL (ref 70–99)
Potassium: 4.1 meq/L (ref 3.5–5.1)
Sodium: 143 mmol/L (ref 136–145)
Total Bilirubin: 0.3 mg/dL (ref 0.0–1.0)
Total Protein: 8.1 g/dL (ref 6.4–8.2)

## 2011-01-25 LAB — CBC AND DIFFERENTIAL
Basophils %, CSF: 0.6 % (ref 0.0–2.0)
Eosinophils Absolute: 0 10*3/uL (ref 0.0–0.6)
Eosinophils, CSF: 0 % (ref 0.0–5.0)
Hematocrit: 33.4 % — ABNORMAL LOW (ref 36.0–48.0)
Hemoglobin: 10.5 g/dL — ABNORMAL LOW (ref 12.0–16.0)
Lymphocytes Relative: 44.5 % — ABNORMAL HIGH (ref 25.0–40.0)
MCH: 23.5 pg — ABNORMAL LOW (ref 26–34)
MCHC: 31.5 g/dL (ref 31–36)
MCV: 74.5 fL — ABNORMAL LOW (ref 80–100)
MPV: 11.5 fL — ABNORMAL HIGH (ref 5.0–10.5)
Monocytes Relative: 13.9 % — ABNORMAL HIGH (ref 0.0–12.0)
Neutrophils Relative: 41 % — ABNORMAL LOW (ref 42.0–63.0)
PLT Morphology: 169 (ref 135–450)
RDW: 16.8 % — ABNORMAL HIGH (ref 11.5–14.5)

## 2011-01-25 LAB — LIPASE: Lipase: 28 U/L (ref 5.6–51.3)

## 2011-01-25 LAB — AMYLASE: Amylase: 143 U/L — ABNORMAL HIGH (ref 25–115)

## 2011-01-25 NOTE — Unmapped (Signed)
Signed by Cleone Slim MD on 01/25/2011 at 21:21:40  Patient: Tracie Romero  Note: All result statuses are Final unless otherwise noted.    Tests: (1) LIPASE (54098)    Order Note: Specimen Source: Blood    Order Note: Confidential Patient Information: Accompanied are [facility]   results that are being delivered by [facility].  If you receive a clinical result for a patient that is not yours please fax to   [facility] at [phone].    Order Note:     Lipase                    28 U/L                      5.6-51.3    Note: An exclamation mark (!) indicates a result that was not dispersed into   the flowsheet.  Document Creation Date: 01/25/2011 8:08 PM  _______________________________________________________________________    (1) Order result status: Final  Collection or observation date-time: 01/25/2011 09:43  Requested date-time:   Receipt date-time: 01/25/2011 15:55  Reported date-time: 01/25/2011 17:25  Referring Physician:    Ordering Physician: Melissa Montane [facility])  Specimen Source:   Source: [facility]  Filler Order Number: [id number]  Lab site:

## 2011-01-25 NOTE — Unmapped (Signed)
Signed by Cleone Slim MD on 01/25/2011 at 21:21:40  Patient: Tracie Romero  Note: All result statuses are Final unless otherwise noted.    Tests: (1) COMP METABOLIC PANEL (19147)    Order Note: Specimen Source: Blood    Order Note:     Order Note:     Na                        143 mEq/L                   136-145    K                         4.1 mEq/L                   3.5-5.1    Cl                        109 mEq/L                   99-110    CO2                       28 mEq/L                    21-32    Glu                       88 mg/dl                    82-95    BUN                       13 mg/dl                    6-21    Creat                     0.8 mg/dl                   3.0-8.6    T Prot                    8.1 gm/dl                   5.7-8.4    Alb                       3.9 gm/dl                   6.9-6.2    A/G Ratio            [L]  0.9                         1.1-2.2    T Bili                    0.30 mg/dl                  9.5-2.8    Alk Phos                  87 U/L  45-129    AST [SGOT]           [H]  39 U/L                      15-37    ALT [SGPT]                28 U/L                      10-40    Ca                        9.2 mg/dl                   1.6-10.9    eGFR                      >60                         Range Below...        RANGE:  >60 mL/min/1.39m2    eGFR-African/Amer         >60                         SEE NOTE      >60 mL/min/1.5m2        EGFR, calc. for ages 55        & older using the MDRD        formula (not corrected        for weight),is valid for        stable renal function.      Note: An exclamation mark (!) indicates a result that was not dispersed into   the flowsheet.  Document Creation Date: 01/25/2011 8:08 PM  _______________________________________________________________________    (1) Order result status: Final  Collection or observation date-time: 01/25/2011 09:43  Requested date-time:   Receipt date-time: 01/25/2011 15:55  Reported  date-time: 01/25/2011 17:25  Referring Physician:    Ordering Physician: Melissa Montane [facility])  Specimen Source:   Source: [facility]  Filler Order Number: [id number]  Lab site:       -----------------    The following lab values were dispersed to the flowsheet  with no units conversion:      Na, 143 MEQ/L, (F)  expected units: mmol/L    Cl, 109 MEQ/L, (F)  expected units: mmol/L    CO2, 28 MEQ/L, (F)  expected units: mmol/L    -----------------    The following non-numeric lab results were dispersed to  the flowsheet even though numeric results were expected:      eGFR-African/Amer, >60

## 2011-01-25 NOTE — Unmapped (Signed)
Signed by Cleone Slim MD on 01/25/2011 at 21:21:40  Patient: Tracie Romero  Note: All result statuses are Final unless otherwise noted.    Tests: (1) CBC WITH DIFF (57846)    Order Note: Specimen Source: Blood    Order Note:     Order Note:   ! WBC                  [L]  3.2 X 1000                  4.0-11.0  ! RBC                       4.48 X(10)6                 4.0-5.2    Hgb                  [L]  10.5 gm/dl                  96.2-95.2    Hct                  [L]  33.4 %                      36.0-48.0    MCV                  [L]  74.5 fl                     80-100    MCH                  [L]  23.5 pg                     26-34    MCHC                      31.5 gm/dl                  84-13    RDW                  [H]  16.8 %                      11.5-14.5    Plt                       169 X(10)3                  135-450    MPV                  [H]  11.5 fl                     5.0-10.5    Segs                 [L]  41.0 %                      42.0-63.0    Lymphs               [H]  44.5 %                      25.0-40.0    Monos                [  H]  13.9 %                      0.0-12.0    Eosin                     0.0 %                       0.0-5.0    Baso                      0.6 %                       0.0-2.0  ! Abs Oliver Barre             [L]  1.3 X(10)3                  1.7-7.7  ! Abs Lym                   1.4 X(10)3                  1.0-5.1  ! Abs Mono                  0.4 X(10)3                  0.0-1.3    Abs Eos                   0.0 X(10)3                  0.0-0.6  ! Abs Baso                  0.0 X(10)3                  0.0-0.2  ! Diff                      Auto-K    Note: An exclamation mark (!) indicates a result that was not dispersed into   the flowsheet.  Document Creation Date: 01/25/2011 8:08 PM  _______________________________________________________________________    (1) Order result status: Final  Collection or observation date-time: 01/25/2011 09:43  Requested date-time:   Receipt date-time: 01/25/2011  15:55  Reported date-time: 01/25/2011 17:04  Referring Physician:    Ordering Physician: Melissa Montane [facility])  Specimen Source:   Source: [facility]  Filler Order Number: [id number]  Lab site:       -----------------    The following lab values were dispersed to the flowsheet  with no units conversion:      Abs Eos, 0.0 X(10)3, (F)  expected units: 10*3/mm3

## 2011-01-25 NOTE — Unmapped (Signed)
Signed by Cleone Slim MD on 01/25/2011 at 21:21:40  Patient: Tracie Romero  Note: All result statuses are Final unless otherwise noted.    Tests: (1) AMYLASE-BLOOD (15080)    Order Note: Specimen Source: Blood    Order Note:     Order Note:     Amylase              [H]  143 U/L                     25-115    Note: An exclamation mark (!) indicates a result that was not dispersed into   the flowsheet.  Document Creation Date: 01/25/2011 8:08 PM  _______________________________________________________________________    (1) Order result status: Final  Collection or observation date-time: 01/25/2011 09:43  Requested date-time:   Receipt date-time: 01/25/2011 15:55  Reported date-time: 01/25/2011 17:25  Referring Physician:    Ordering Physician: Melissa Montane [facility])  Specimen Source:   Source: [facility]  Filler Order Number: [id number]  Lab site:

## 2011-01-31 NOTE — Unmapped (Addendum)
Signed by Generic  [facility] Provider on 01/31/2011 at 00:00:00  Transferred Records       Imported By: Coletta Memos 02/09/2011 22:14:41    _____________________________________________________________________    External Attachment:    Please see [facility] EMR for this [facility]ep[facility]_document_incr.

## 2012-03-22 ENCOUNTER — Emergency Department: Payer: Self-pay | Admitting: Emergency Medicine

## 2012-03-22 LAB — CK TOTAL AND CKMB (NOT AT ARMC)
CK, Total: 77 U/L (ref 21–215)
CK-MB: 1 ng/mL (ref 0.5–3.6)

## 2012-03-22 LAB — PRO B NATRIURETIC PEPTIDE: B-Type Natriuretic Peptide: 92 pg/mL (ref 0–125)

## 2012-03-22 LAB — CBC
MCH: 31.2 pg (ref 26.0–34.0)
MCHC: 33.4 g/dL (ref 32.0–36.0)
MCV: 93 fL (ref 80–100)
Platelet: 351 10*3/uL (ref 150–440)
RBC: 4.04 10*6/uL (ref 3.80–5.20)
RDW: 13.5 % (ref 11.5–14.5)
WBC: 10.7 10*3/uL (ref 3.6–11.0)

## 2012-03-22 LAB — TROPONIN I: Troponin-I: <0.02 ng/mL

## 2012-03-22 LAB — BASIC METABOLIC PANEL
BUN: 17 mg/dL (ref 7–18)
EGFR (Non-African Amer.): >60
Osmolality: 257 (ref 275–301)
Potassium: 3.5 mmol/L (ref 3.5–5.1)

## 2012-03-28 ENCOUNTER — Ambulatory Visit: Payer: Self-pay | Admitting: Cardiology

## 2012-07-04 ENCOUNTER — Ambulatory Visit: Payer: Self-pay

## 2012-07-25 ENCOUNTER — Ambulatory Visit: Payer: Self-pay

## 2012-09-02 ENCOUNTER — Ambulatory Visit: Payer: Self-pay | Admitting: Surgery

## 2012-12-22 ENCOUNTER — Emergency Department: Payer: Self-pay | Admitting: Emergency Medicine

## 2013-02-04 ENCOUNTER — Ambulatory Visit: Payer: Self-pay | Admitting: Gastroenterology

## 2013-04-15 ENCOUNTER — Ambulatory Visit: Payer: Self-pay | Admitting: Surgery

## 2013-04-29 ENCOUNTER — Ambulatory Visit: Payer: Self-pay | Admitting: Vascular Surgery

## 2013-04-29 LAB — CREATININE, SERUM
Creatinine: 0.78 mg/dL (ref 0.60–1.30)
EGFR (African American): >60
EGFR (Non-African Amer.): >60

## 2013-04-29 LAB — BUN: BUN: 11 mg/dL (ref 7–18)

## 2013-10-20 ENCOUNTER — Ambulatory Visit: Payer: Self-pay

## 2013-10-24 ENCOUNTER — Ambulatory Visit: Payer: Self-pay | Admitting: Vascular Surgery

## 2013-10-24 LAB — CREATININE, SERUM
CREATININE: 1.22 mg/dL (ref 0.60–1.30)
EGFR (Non-African Amer.): 52 — ABNORMAL LOW
GFR CALC AF AMER: 60 — AB

## 2013-10-24 LAB — BUN: BUN: 9 mg/dL (ref 7–18)

## 2013-12-19 ENCOUNTER — Ambulatory Visit: Payer: Self-pay | Admitting: Physician Assistant

## 2013-12-19 LAB — URINALYSIS, COMPLETE
Bilirubin,UR: NEGATIVE
Blood: NEGATIVE
Glucose,UR: NEGATIVE mg/dL (ref 0–75)
Ketone: NEGATIVE
Leukocyte Esterase: NEGATIVE
Nitrite: NEGATIVE
PH: 6 (ref 4.5–8.0)
Protein: NEGATIVE
RBC,UR: NONE SEEN /HPF (ref 0–5)
Specific Gravity: 1.02 (ref 1.003–1.030)
WBC UR: NONE SEEN /HPF (ref 0–5)

## 2013-12-21 LAB — URINE CULTURE

## 2013-12-30 ENCOUNTER — Emergency Department: Payer: Self-pay | Admitting: Emergency Medicine

## 2013-12-31 LAB — URINALYSIS, COMPLETE
BILIRUBIN, UR: NEGATIVE
Bacteria: NONE SEEN
GLUCOSE, UR: NEGATIVE mg/dL (ref 0–75)
KETONE: NEGATIVE
Nitrite: NEGATIVE
PH: 6 (ref 4.5–8.0)
Protein: NEGATIVE
SPECIFIC GRAVITY: 1.005 (ref 1.003–1.030)
Squamous Epithelial: 2

## 2013-12-31 LAB — CBC WITH DIFFERENTIAL/PLATELET
BASOS ABS: 0.1 10*3/uL (ref 0.0–0.1)
BASOS PCT: 0.7 %
Eosinophil #: 0.3 10*3/uL (ref 0.0–0.7)
Eosinophil %: 4 %
HCT: 36.2 % (ref 35.0–47.0)
HGB: 12.1 g/dL (ref 12.0–16.0)
LYMPHS ABS: 3.4 10*3/uL (ref 1.0–3.6)
Lymphocyte %: 45.9 %
MCH: 31 pg (ref 26.0–34.0)
MCHC: 33.4 g/dL (ref 32.0–36.0)
MCV: 93 fL (ref 80–100)
MONO ABS: 0.6 x10 3/mm (ref 0.2–0.9)
Monocyte %: 7.7 %
NEUTROS ABS: 3.1 10*3/uL (ref 1.4–6.5)
NEUTROS PCT: 41.7 %
PLATELETS: 300 10*3/uL (ref 150–440)
RBC: 3.89 10*6/uL (ref 3.80–5.20)
RDW: 15.8 % — ABNORMAL HIGH (ref 11.5–14.5)
WBC: 7.5 10*3/uL (ref 3.6–11.0)

## 2013-12-31 LAB — COMPREHENSIVE METABOLIC PANEL
ANION GAP: 7 (ref 7–16)
AST: 19 U/L (ref 15–37)
Albumin: 3.6 g/dL (ref 3.4–5.0)
Alkaline Phosphatase: 65 U/L
BILIRUBIN TOTAL: 0.3 mg/dL (ref 0.2–1.0)
BUN: 7 mg/dL (ref 7–18)
CALCIUM: 9.1 mg/dL (ref 8.5–10.1)
CO2: 25 mmol/L (ref 21–32)
Chloride: 105 mmol/L (ref 98–107)
Creatinine: 1.11 mg/dL (ref 0.60–1.30)
EGFR (African American): >60
GFR CALC NON AF AMER: 58 — AB
Glucose: 146 mg/dL — ABNORMAL HIGH (ref 65–99)
OSMOLALITY: 274 (ref 275–301)
Potassium: 3.1 mmol/L — ABNORMAL LOW (ref 3.5–5.1)
SGPT (ALT): 21 U/L (ref 12–78)
Sodium: 137 mmol/L (ref 136–145)
TOTAL PROTEIN: 7.1 g/dL (ref 6.4–8.2)

## 2013-12-31 LAB — LIPASE, BLOOD: Lipase: 283 U/L (ref 73–393)

## 2014-02-17 ENCOUNTER — Ambulatory Visit: Payer: Self-pay | Admitting: Vascular Surgery

## 2014-02-17 LAB — CREATININE, SERUM
CREATININE: 1.02 mg/dL (ref 0.60–1.30)
EGFR (Non-African Amer.): >60

## 2014-02-17 LAB — BUN: BUN: 13 mg/dL (ref 7–18)

## 2014-04-21 ENCOUNTER — Ambulatory Visit: Payer: Self-pay | Admitting: Cardiology

## 2014-05-18 DIAGNOSIS — Z951 Presence of aortocoronary bypass graft: Secondary | ICD-10-CM | POA: Insufficient documentation

## 2014-06-23 ENCOUNTER — Ambulatory Visit: Payer: Self-pay | Admitting: Vascular Surgery

## 2014-07-11 HISTORY — PX: OTHER SURGICAL HISTORY: SHX169
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
## 2014-09-30 NOTE — Unmapped (Signed)
Date of Surgery Update: Tracie Romero was seen, history and physical examination reviewed, patient examined, and the site of surgery has been marked by me including my initials, ROD. There have been no significant clinical changes since the   completion of the above history and physical. Surgical wound infection [T81.4XXA] Retrosternal abscess (HCC) [J85.3] Procedure(s): STERNAL DEBRIDEMENT EXCISION STERNAL WOUND AND STERNAL REMNANTS BILATERAL PECTORALIS MUSCLE FLAP I find that the patient is   ready for the planned surgery and anesthesia. Patient Ready for OR: Yes     Signed By: Jae Dire., MD  September 30, 2014 7:26 AM             _________________________________  Signed by:  Atha Starks    DA    D: 09/30/2014 07:26 AM  T: 09/30/2014 07:26 AM    This document is confidential medical information.  Unauthorized disclosure or use of this information is prohibited by law.  If you are not the intended recipient of this document, please advise [place] by calling immediately [phone].
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
## 2014-11-27 DIAGNOSIS — M869 Osteomyelitis, unspecified: Secondary | ICD-10-CM | POA: Insufficient documentation

## 2014-11-27 DIAGNOSIS — Z87898 Personal history of other specified conditions: Secondary | ICD-10-CM | POA: Insufficient documentation

## 2015-01-22 NOTE — Op Note (Signed)
PATIENT NAME:  Kirsten Castro, Kirsten Castro MR#:  [id number] DATE OF BIRTH:  1961/08/23  DATE OF PROCEDURE:  04/29/2013  PREOPERATIVE DIAGNOSIS: Atherosclerotic occlusive disease, bilateral lower extremities, with rest pain.   POSTOPERATIVE DIAGNOSES: Atherosclerotic occlusive disease, bilateral lower extremities, with rest pain.   PROCEDURE:  1.  Abdominal aortogram.  2.  Right lower extremity distal runoff.   SURGEON: Katha Cabal, MD.   SEDATION: Precedex drip. Continuous ECG, pulse oximetry and cardiopulmonary monitoring is performed throughout the entire procedure by the interventional radiology nurse. Total sedation time was 1 hour 30 minutes.   ACCESS:  1.  A 6-French sheath, retrograde direction, right common femoral artery.  2.  A 6-French sheath, retrograde direction, left common femoral artery.   CONTRAST USED: Isovue 90 mL.   FLUOROSCOPY TIME: 5.3 minutes.   INDICATIONS: Ms. Nop presents with increasing pain of her right lower extremity. Noninvasive studies as well as physical examination demonstrated profound atherosclerotic occlusive disease, and she is therefore undergoing angiography with the hope for intervention for limb salvage. The risks and benefits were reviewed. All questions answered. The patient agrees to proceed.   DESCRIPTION OF PROCEDURE: The patient is taken into special procedures and placed in the supine position. After adequate sedation is achieved, both groins are prepped and draped in sterile fashion. Ultrasound is placed in a sterile sleeve. Ultrasound is utilized secondary to lack of appropriate landmarks and to avoid vascular injury. Under direct visualization with the ultrasound, left common femoral artery is identified. It is echolucent and pulsatile, indicating patency. Image is recorded for the permanent record. Lidocaine is infiltrated and subsequently the anterior wall is punctured with a micropuncture needle, microwire followed by micro sheath,  J-wire followed by a 5-French sheath and 5-French pigtail catheter. The pigtail catheter is positioned at the level of T12 and AP projection of the aorta is obtained. Images then were reviewed and the pigtail catheter is repositioned to above the bifurcation. LAO and RAO projections of the aorta are obtained. Extensive disease is noted on the right at the origin of the common iliac as well as just above the ilioinguinal ligament below the stent. Therefore 4000 units of heparin is given. An ultrasound is utilized once again to image the right common femoral artery.   The right common femoral artery is noted to be echolucent, minimally pulsatile, but again echolucent, indicating patency. Image is recorded for the permanent record. Under direct ultrasound visualization, anterior wall of the common femoral artery was punctured, microwire followed by micro sheath is inserted. Magic Torque wire is then advanced. A 6-French sheath is inserted. Left groin was up-sized to a 6-French sheath.   An 8 x 39 Omnilink stent is then advanced up the right side. The existing stent on the left at the origin of the common iliac appears patent, and therefore a 7 x 4 balloon was advanced up this side, and the stent deployed on the right using a kissing-balloon technique.   Follow-up angiography demonstrates an excellent result and attention is turned to the distal lesion just below the distal margin of the right iliac stent. Here, a 7 x 30 Absolute Pro stent is deployed and postdilated to 7 mm. Distal runoff is then obtained through the existing right groin sheath.   A pigtail catheter was then readvanced up the left, and oblique views of the pelvis obtained. Subsequently, Mynx devices were deployed to close both right and left groins after oblique images were obtained. The patient tolerated the procedure well and  there were no immediate complications.   INTERPRETATION: Initial images of the aorta demonstrate significant plaque  formation and perhaps a slight aneurysmal with dilatation; however, there does not appear to be any hemodynamically significant stenosis of the aorta. Bilateral nephrograms are noted with single renal arteries. There is a high-grade stenosis greater than 75% on the left. There is a greater-than-90% stenosis at the origin of the right common iliac and approximately an 80% to 85% stenosis at the distal edge or end of the right external iliac stent. The common iliac stent on the left appears patent with approximately 50% stenosis at its origin. External iliac on the left is small but appears patent.   The common femoral, profunda femoris, superficial femoral, popliteal and tibials appear patent on the right with inline runoff to the foot.   Following angioplasty and stent placement at the common iliac on the right with kissing-balloon technique, with angioplasty to the left simultaneously, there is patency of the aortic bifurcation with this tremendous improvement in the right iliac flow. Following angioplasty and stent placement of the distal external iliac artery, landing the stent just proximal to the circumflex vessels and post dilating it to 7, there is again wide patency of the external iliac and tremendous improvement in flow on the right.   SUMMARY: Successful recanalization of the right aortoiliac system with angioplasty of the left common iliac as described above for limb salvage.    ____________________________ Katha Cabal, MD ggs:np D: 04/30/2013 16:42:00 ET T: 04/30/2013 17:32:13 ET JOB#: [id number]  cc: Katha Cabal, MD, <Dictator> Cheral Marker. Ola Spurr, MD   Katha Cabal MD ELECTRONICALLY SIGNED 05/13/2013 8:48

## 2015-01-23 NOTE — Op Note (Signed)
PATIENT NAME:  Kirsten Castro, Kirsten Castro MR#:  [id number] DATE OF BIRTH:  1961/09/24  DATE OF PROCEDURE:  02/17/2014  PREOPERATIVE DIAGNOSES: 1. Mesenteric ischemia.  2. Chronic abdominal pain.  3. Atherosclerotic occlusive disease, bilateral lower extremities.  4. Coronary artery disease.   POSTOPERATIVE DIAGNOSES:  1. Mesenteric ischemia.  2. Chronic abdominal pain.  3. Atherosclerotic occlusive disease, bilateral lower extremities.  4. Coronary artery disease.  5. Urinary retention.   PROCEDURES PERFORMED: 1. Abdominal aortogram.  2. Selective injection of the superior mesenteric artery.  3. Percutaneous transluminal angioplasty and stent placement of the superior mesenteric artery.     SURGEON: Katha Cabal, M.D.   SEDATION: Precedex intravenously with supplemental fentanyl. Continuous ECG, pulse oximetry and cardiopulmonary monitoring is performed throughout the entire procedure by the interventional radiology nurse. Total sedation time was 50 minutes.   ACCESS: A 6 French sheath, right common femoral artery.   FLUOROSCOPY TIME: 5.8 minutes.   CONTRAST USED: Isovue 48 mL.   INDICATIONS: Kirsten Castro is a 54 year old woman with multiple medical problems and an extensive history of atherosclerotic occlusive disease with past history of multiple angioplasty  stents and other interventions. She has had return of her abdominal pain and now is undergoing angiography with the hope for intervention within the mesenteric system and the risks and benefits were reviewed. All questions answered. The patient has agreed to proceed.   DESCRIPTION OF PROCEDURE: The patient is taken to special procedures and placed in the supine position. After adequate sedation has been achieved, both groins are prepped and draped in sterile fashion. Ultrasound is placed in a sterile sleeve. Common femoral artery is identified. It is echolucent and pulsatile indicating patency. Image is recorded for the permanent  record.  Under direct imaging, micropuncture needle is inserted, microwire followed by micro sheath, J-wire followed by a 5 French sheath, 5 French pigtail catheter.   The pigtail catheter is positioned at the level of T12 and an AP projection of the aorta is obtained. This localizes the SMA and the pigtail catheter is repositioned slightly and then a true lateral is obtained. Image demonstrates a 70% to 75% narrowing of the SMA at its origin and therefore 4000 units of heparin is given. Wire is reintroduced and the pigtail catheter and 5 French sheath are removed. A 6 French sheath is inserted. A 6 [place] guiding catheter, LIMA is then advanced over the wire and used to engage the SMA directly. A floppy Glidewire is then advanced without difficulty.   With the wire securing the guiding catheters position, hand injection of contrast is then used to demonstrate the SMA and magnified views. Because of the true lateral the computer is not able to make an accurate measurement and therefore we returned to the AP and measured the SMA at an isolated point on this. Measurements were approximately 5 mm, and therefore it was elected to use a 5 x 18 Herculink stent. This was opened onto the field. A straight glide catheter was advanced over the floppy Glidewire and the wire was exchanged for an 0.014 Spartacore. The stent was then advanced over the wire and positioned across the origin of the SMA. After several magnified images is puffing through the guiding sheath we used to positioned the stent. It was deployed without difficulty. Follow-up imaging demonstrated it was somewhat undersized and therefore a 6 x 3 balloon was advanced across the stent and inflated to 12 atmospheres for approximately 30 seconds. Follow-up imaging now demonstrated excellent result with the  stent extending approximately 2 mm into the aorta. Wire was removed and a J-wire advanced through the guiding sheath. Guiding sheath was removed, oblique  view of the right groin was obtained and StarClose device deployed without difficulty. There are no immediate complications.   INTERPRETATION: The aorta is opacified with a bolus injection of contrast. The origins of the visceral vessels are located at approximately T12, as expected.   In the true lateral, there is 70% to 75% narrowing of the origin of the SMA. Following placement of a Herculink stent, it appears to be somewhat undersized and a post stent placement dilatation with a 6 mm balloon is performed and this then appears to match the distal SMA quite nicely. The stent is well opposed.   SUMMARY: Successful angioplasty and stent placement of the superior mesenteric artery as described above.     ____________________________ Katha Cabal, MD ggs:sg D: 02/18/2014 13:36:54 ET T: 02/18/2014 14:28:20 ET JOB#: [id number]  cc: Katha Cabal, MD, <Dictator> Cheral Marker. Ola Spurr, MD  Katha Cabal MD ELECTRONICALLY SIGNED 03/03/2014 12:27

## 2015-01-23 NOTE — Op Note (Signed)
PATIENT NAME:  Kirsten Castro, METHENY MR#:  [id number] DATE OF BIRTH:  01-16-1961  DATE OF PROCEDURE:  10/24/2013  PREOPERATIVE DIAGNOSIS: Mesenteric ischemia.   POSTOPERATIVE DIAGNOSES: 1.  Mesenteric ischemia.  2.  Stricture stenosis superior mesenteric artery.  3.  Stricture stenosis celiac artery.   PROCEDURES PERFORMED: 1.  Abdominal aortogram.  2.  Selective injection of the celiac artery first-order catheter placement.  3.  Selective injection of the SMA first-order catheter placement.  4.  Percutaneous transluminal angioplasty of the SMA to 5 mm.   PROCEDURES PERFORMED BY:  Katha Cabal, M.D.   SEDATION:  Versed 7 mg plus fentanyl 300 mcg administered IV. Continuous ECG, pulse oximetry, and cardiopulmonary monitoring is performed throughout the entire procedure by the interventional radiology nurse. Total sedation time is 1 hour 40 minutes.   ACCESS:  A 6 French sheath, right common femoral artery.   FLUOROSCOPY TIME: 10.0 minutes.   CONTRAST USED:  Isovue 105 mL  INDICATIONS: Ms. Hornik is a 54 year old woman who has been having increasing abdominal pain postprandially. She has lost approximately 6 pounds. She is a known vasculopath, and has had multiple interventions of the extremities in her past. Noninvasive duplex ultrasound of the visceral vessels demonstrated critical stenosis of the superior mesenteric and celiac arteries. The risks and benefits for angiography and possible intervention were reviewed. All questions were answered. The patient agrees to proceed.   DESCRIPTION OF PROCEDURE:  The patient is taken to special procedures and placed in the supine position. After adequate sedation is achieved, both groins are prepped and draped in sterile fashion. Ultrasound is placed in a sterile sleeve. Ultrasound is utilized secondary to lack of appropriate landmarks and to avoid vascular injury. Under direct ultrasound visualization, the common femoral artery is identified.  It is pulsatile and echolucent, indicating patency. Image is recorded for the permanent record. Lidocaine is infiltrated under ultrasound guidance surrounding the artery itself as well as the skin, and access is obtained with a micropuncture needle under direct ultrasound guidance. Microwire followed by micro-sheath, J-wire followed by a 5 French sheath and 5 French pigtail catheter. The pigtail catheter is positioned at the level of T10, and AP projection of the aorta is obtained. After localizing the visceral vessels, the detector is positioned laterally and imaging is performed of the origins of the celiac and the SMA. Initially in this view, there does not appear to be any profound stenosis. However, the patient's history and symptomatology is quite consistent with mesenteric ischemia, and therefore I elected to select the vessels individually and do magnified views.   A 6 French Ansell High-Flex is then exchanged for the 5 [place] sheath after a stiff-angled Glidewire is advanced through the pigtail catheter, and pigtail catheter is removed. Once the Alita Chyle has been positioned in the infrarenal aorta, a VS-1 catheter is advanced, reformed in the descending aorta, and then used to select the celiac. Under magnified views, with full-strength contrast, the celiac access is imaged. It does appear to have approximately a 50% stenosis, but this does not appear to warrant treatment.   The SMA is then selected and, again, full-strength contrast is used with multiple views. It does appear to be a narrowing, but it appears to be very focused. There is a diffuse 50% stenosis extending across the first 15 mm of the artery, but at the origin there does appear to be a napkin ring-like lesion. This represented an odd situation, and I did not feel that stenting would necessarily be  the optimal therapy. Therefore, I gave 4000 units of heparin. Magic torque wire was advanced into the SMA, the High-Flex sheath was advanced up  to the origin, and a 5 x 2 Fox balloon was advanced across the origin of the SMA. Angioplasty was then performed to 12 atmospheres for 2 minutes. Followup angiography did demonstrate an improved appearance to the SMA at its origin. The detector was then positioned AP, and the entire SMA distally was imaged with contrast injection. There did not appear to be any significant distal disease, and therefore the wire and catheter were removed. The sheath was pulled back into the external iliac on the right, and J-wire was advanced. The catheter was then positioned with its tip in the external iliac on the right, and oblique view obtained and a StarClose device deployed. There were no immediate complications.   INTERPRETATION: The celiac access is imaged with selection and appears to have approximately a 50% narrowing. The SMA was selected, and in oblique view did appear to have a hemodynamically significant lesion. However, it appeared to be focal at the origin. There was some more diffuse plaque noted extending approximately 15 mm into the SFA, but this measurement was approximately 50%.   Angioplasty was performed using a 5 mm balloon, which demonstrated significant improvement in the appearance of the SMA following treatment.   SUMMARY:  Successful angioplasty of the SMA at its origin. SMA was otherwise normal. Of note, the patient did tolerate a regular diet for lunch following the intervention without any postprandial pain, and this was the first time she stated that she had been able to eat regular food in the last month or so without pain.    ____________________________ Katha Cabal, MD ggs:mr D: 10/24/2013 19:43:07 ET T: 10/24/2013 21:41:20 ET JOB#: [id number]  cc: Katha Cabal, MD, <Dictator> Cheral Marker. Ola Spurr, MD  Katha Cabal MD ELECTRONICALLY SIGNED 11/12/2013 13:24

## 2015-01-28 ENCOUNTER — Ambulatory Visit
Admit: 2015-01-28 | Disposition: A | Discharge status: Home / Self Care | Payer: Self-pay | Attending: Vascular Surgery | Admitting: Vascular Surgery

## 2015-02-10 ENCOUNTER — Encounter: Payer: Self-pay | Admitting: *Deleted

## 2015-02-10 ENCOUNTER — Inpatient Hospital Stay
Admission: RE | Admit: 2015-02-10 | Discharge: 2015-02-12 | DRG: 035 | Disposition: A | Discharge status: Home / Self Care | Payer: BLUE CROSS/BLUE SHIELD | Source: Ambulatory Visit | Attending: Vascular Surgery | Admitting: Vascular Surgery

## 2015-02-10 ENCOUNTER — Inpatient Hospital Stay: Payer: BLUE CROSS/BLUE SHIELD

## 2015-02-10 ENCOUNTER — Other Ambulatory Visit: Payer: Self-pay

## 2015-02-10 ENCOUNTER — Inpatient Hospital Stay (HOSPITAL_COMMUNITY): Payer: BLUE CROSS/BLUE SHIELD

## 2015-02-10 ENCOUNTER — Encounter
Admission: RE | Disposition: A | Discharge status: Home / Self Care | Payer: Medicare Other | Source: Ambulatory Visit | Attending: Vascular Surgery

## 2015-02-10 DIAGNOSIS — Z951 Presence of aortocoronary bypass graft: Secondary | ICD-10-CM

## 2015-02-10 DIAGNOSIS — T8110XA Postprocedural shock unspecified, initial encounter: Secondary | ICD-10-CM | POA: Diagnosis not present

## 2015-02-10 DIAGNOSIS — G459 Transient cerebral ischemic attack, unspecified: Secondary | ICD-10-CM | POA: Diagnosis present

## 2015-02-10 DIAGNOSIS — Z8673 Personal history of transient ischemic attack (TIA), and cerebral infarction without residual deficits: Secondary | ICD-10-CM | POA: Diagnosis not present

## 2015-02-10 DIAGNOSIS — J441 Chronic obstructive pulmonary disease with (acute) exacerbation: Secondary | ICD-10-CM | POA: Diagnosis not present

## 2015-02-10 DIAGNOSIS — Z7982 Long term (current) use of aspirin: Secondary | ICD-10-CM

## 2015-02-10 DIAGNOSIS — I252 Old myocardial infarction: Secondary | ICD-10-CM | POA: Diagnosis not present

## 2015-02-10 DIAGNOSIS — I2510 Atherosclerotic heart disease of native coronary artery without angina pectoris: Secondary | ICD-10-CM | POA: Diagnosis present

## 2015-02-10 DIAGNOSIS — Y838 Other surgical procedures as the cause of abnormal reaction of the patient, or of later complication, without mention of misadventure at the time of the procedure: Secondary | ICD-10-CM | POA: Diagnosis not present

## 2015-02-10 DIAGNOSIS — R0603 Acute respiratory distress: Secondary | ICD-10-CM

## 2015-02-10 DIAGNOSIS — I6529 Occlusion and stenosis of unspecified carotid artery: Secondary | ICD-10-CM

## 2015-02-10 DIAGNOSIS — I6522 Occlusion and stenosis of left carotid artery: Secondary | ICD-10-CM | POA: Diagnosis present

## 2015-02-10 HISTORY — DX: Chronic obstructive pulmonary disease, unspecified: J44.9

## 2015-02-10 HISTORY — DX: Angina pectoris, unspecified: I20.9

## 2015-02-10 HISTORY — DX: Occlusion and stenosis of unspecified carotid artery: I65.29

## 2015-02-10 HISTORY — DX: Cerebral infarction, unspecified: I63.9

## 2015-02-10 HISTORY — DX: Atherosclerotic heart disease of native coronary artery without angina pectoris: I25.10

## 2015-02-10 HISTORY — DX: Acute myocardial infarction, unspecified: I21.9

## 2015-02-10 HISTORY — PX: PERIPHERAL VASCULAR CATHETERIZATION: SHX172C

## 2015-02-10 LAB — BASIC METABOLIC PANEL
ANION GAP: 6 (ref 5–15)
BUN: 6 mg/dL (ref 6–20)
CALCIUM: 8.3 mg/dL — AB (ref 8.9–10.3)
CO2: 31 mmol/L (ref 22–32)
CREATININE: 0.54 mg/dL (ref 0.44–1.00)
Chloride: 101 mmol/L (ref 101–111)
GFR calc non Af Amer: >60 mL/min (ref >60)
Glucose, Bld: 122 mg/dL — ABNORMAL HIGH (ref 65–99)
Potassium: 4.1 mmol/L (ref 3.5–5.1)
Sodium: 138 mmol/L (ref 135–145)

## 2015-02-10 LAB — CBC WITH DIFFERENTIAL/PLATELET
BASOS ABS: 0 10*3/uL (ref 0–0.1)
BASOS PCT: 0 %
Eosinophils Absolute: 0.1 10*3/uL (ref 0–0.7)
Eosinophils Relative: 1 %
HEMATOCRIT: 29.5 % — AB (ref 35.0–47.0)
Hemoglobin: 8.9 g/dL — ABNORMAL LOW (ref 12.0–16.0)
Lymphocytes Relative: 14 %
Lymphs Abs: 1.8 10*3/uL (ref 1.0–3.6)
MCH: 22.9 pg — ABNORMAL LOW (ref 26.0–34.0)
MCHC: 30 g/dL — ABNORMAL LOW (ref 32.0–36.0)
MCV: 76.4 fL — ABNORMAL LOW (ref 80.0–100.0)
Monocytes Absolute: 1 10*3/uL — ABNORMAL HIGH (ref 0.2–0.9)
Monocytes Relative: 8 %
NEUTROS ABS: 10 10*3/uL — AB (ref 1.4–6.5)
NEUTROS PCT: 77 %
Platelets: 307 10*3/uL (ref 150–440)
RBC: 3.87 MIL/uL (ref 3.80–5.20)
RDW: 20.5 % — AB (ref 11.5–14.5)
WBC: 13 10*3/uL — ABNORMAL HIGH (ref 3.6–11.0)

## 2015-02-10 LAB — PHOSPHORUS: Phosphorus: 3.8 mg/dL (ref 2.5–4.6)

## 2015-02-10 LAB — GLUCOSE, CAPILLARY: GLUCOSE-CAPILLARY: 108 mg/dL — AB (ref 70–99)

## 2015-02-10 LAB — CREATININE, SERUM
Creatinine, Ser: 0.57 mg/dL (ref 0.44–1.00)
GFR calc non Af Amer: >60 mL/min (ref >60)

## 2015-02-10 LAB — TROPONIN I: Troponin I: <0.03 ng/mL (ref <0.031)

## 2015-02-10 LAB — BUN: BUN: 8 mg/dL (ref 6–20)

## 2015-02-10 LAB — MAGNESIUM: MAGNESIUM: 1.1 mg/dL — AB (ref 1.7–2.4)

## 2015-02-10 SURGERY — CAROTID PTA/STENT INTERVENTION
Anesthesia: Moderate Sedation

## 2015-02-10 MED ORDER — BUDESONIDE 0.5 MG/2ML IN SUSP
0.5000 mg | Freq: Two times a day (BID) | RESPIRATORY_TRACT | Status: DC
  Administered 2015-02-11 – 2015-02-12 (×3): 0.5 mg via RESPIRATORY_TRACT
  Filled 2015-02-10 (×3): qty 2

## 2015-02-10 MED ORDER — ATROPINE SULFATE 0.1 MG/ML IJ SOLN
INTRAMUSCULAR | Status: AC
  Filled 2015-02-10: qty 10

## 2015-02-10 MED ORDER — FENTANYL CITRATE (PF) 100 MCG/2ML IJ SOLN
INTRAMUSCULAR | Status: DC | PRN
  Administered 2015-02-10: 25 ug via INTRAVENOUS
  Administered 2015-02-10 (×2): 50 ug via INTRAVENOUS
  Administered 2015-02-10: 25 ug via INTRAVENOUS

## 2015-02-10 MED ORDER — SODIUM CHLORIDE 0.9 % IV SOLN: 500.0000 mL | Freq: Once | INTRAVENOUS | Status: AC | PRN

## 2015-02-10 MED ORDER — ATORVASTATIN CALCIUM 20 MG PO TABS
80.0000 mg | ORAL_TABLET | Freq: Every day | ORAL | Status: DC
  Administered 2015-02-11: 80 mg via ORAL
  Filled 2015-02-10: qty 4

## 2015-02-10 MED ORDER — ASPIRIN EC 81 MG PO TBEC
81.0000 mg | DELAYED_RELEASE_TABLET | Freq: Every day | ORAL | Status: DC
  Administered 2015-02-10 – 2015-02-12 (×3): 81 mg via ORAL
  Filled 2015-02-10 (×2): qty 1

## 2015-02-10 MED ORDER — CEFAZOLIN SODIUM 1-5 GM-% IV SOLN
INTRAVENOUS | Status: AC
  Filled 2015-02-10: qty 50

## 2015-02-10 MED ORDER — MOMETASONE FURO-FORMOTEROL FUM 100-5 MCG/ACT IN AERO
2.0000 | INHALATION_SPRAY | Freq: Two times a day (BID) | RESPIRATORY_TRACT | Status: DC
  Administered 2015-02-10 – 2015-02-11 (×2): 2 via RESPIRATORY_TRACT
  Filled 2015-02-10: qty 8.8

## 2015-02-10 MED ORDER — ONDANSETRON HCL 4 MG/2ML IJ SOLN
4.0000 mg | Freq: Four times a day (QID) | INTRAMUSCULAR | Status: DC | PRN
Start: 2015-02-10 — End: 2015-02-12

## 2015-02-10 MED ORDER — DOPAMINE-DEXTROSE 3.2-5 MG/ML-% IV SOLN
INTRAVENOUS | Status: AC
  Filled 2015-02-10: qty 250

## 2015-02-10 MED ORDER — METOPROLOL TARTRATE 1 MG/ML IV SOLN: 2.0000 mg | INTRAVENOUS | Status: DC | PRN

## 2015-02-10 MED ORDER — PHENOL 1.4 % MT LIQD
1.0000 | OROMUCOSAL | Status: DC | PRN
  Filled 2015-02-10: qty 177

## 2015-02-10 MED ORDER — TRAZODONE HCL 50 MG PO TABS
50.0000 mg | ORAL_TABLET | Freq: Every day | ORAL | Status: DC
  Administered 2015-02-10 – 2015-02-11 (×2): 50 mg via ORAL
  Filled 2015-02-10 (×2): qty 1

## 2015-02-10 MED ORDER — FUROSEMIDE 10 MG/ML IJ SOLN
20.0000 mg | Freq: Every day | INTRAMUSCULAR | Status: DC
  Administered 2015-02-11 – 2015-02-12 (×2): 20 mg via INTRAVENOUS
  Filled 2015-02-10 (×2): qty 2

## 2015-02-10 MED ORDER — ASPIRIN 81 MG PO CHEW
CHEWABLE_TABLET | ORAL | Status: AC
  Filled 2015-02-10: qty 1

## 2015-02-10 MED ORDER — ONDANSETRON HCL 4 MG/2ML IJ SOLN
INTRAMUSCULAR | Status: AC
  Administered 2015-02-10: 4 mg via INTRAVENOUS
  Filled 2015-02-10: qty 2

## 2015-02-10 MED ORDER — ALBUTEROL SULFATE (2.5 MG/3ML) 0.083% IN NEBU
2.5000 mg | INHALATION_SOLUTION | RESPIRATORY_TRACT | Status: DC | PRN
  Administered 2015-02-10 – 2015-02-11 (×2): 2.5 mg via RESPIRATORY_TRACT
  Filled 2015-02-10 (×2): qty 3

## 2015-02-10 MED ORDER — IOHEXOL 300 MG/ML  SOLN
INTRAMUSCULAR | Status: DC | PRN
Start: 2015-02-10 — End: 2015-02-10
  Administered 2015-02-10: 90 mL via INTRA_ARTERIAL

## 2015-02-10 MED ORDER — FAMOTIDINE IN NACL 20-0.9 MG/50ML-% IV SOLN
20.0000 mg | Freq: Two times a day (BID) | INTRAVENOUS | Status: DC
  Administered 2015-02-10 (×2): 20 mg via INTRAVENOUS
  Filled 2015-02-10 (×5): qty 50

## 2015-02-10 MED ORDER — CLOPIDOGREL BISULFATE 75 MG PO TABS
75.0000 mg | ORAL_TABLET | Freq: Every day | ORAL | Status: DC
  Administered 2015-02-10 – 2015-02-12 (×3): 75 mg via ORAL
  Filled 2015-02-10 (×2): qty 1

## 2015-02-10 MED ORDER — ACETAMINOPHEN 325 MG PO TABS: 325.0000 mg | ORAL_TABLET | ORAL | Status: DC | PRN

## 2015-02-10 MED ORDER — LIDOCAINE HCL (PF) 1 % IJ SOLN
INTRAMUSCULAR | Status: AC
  Filled 2015-02-10: qty 10

## 2015-02-10 MED ORDER — FENTANYL CITRATE (PF) 100 MCG/2ML IJ SOLN
INTRAMUSCULAR | Status: AC
  Filled 2015-02-10: qty 2

## 2015-02-10 MED ORDER — CLOPIDOGREL BISULFATE 75 MG PO TABS
ORAL_TABLET | ORAL | Status: AC
  Filled 2015-02-10: qty 1

## 2015-02-10 MED ORDER — MIDAZOLAM HCL 5 MG/5ML IJ SOLN
INTRAMUSCULAR | Status: AC
  Filled 2015-02-10: qty 5

## 2015-02-10 MED ORDER — HEPARIN SODIUM (PORCINE) 1000 UNIT/ML IJ SOLN
INTRAMUSCULAR | Status: AC
  Filled 2015-02-10: qty 1

## 2015-02-10 MED ORDER — ALUM & MAG HYDROXIDE-SIMETH 200-200-20 MG/5ML PO SUSP: 15.0000 mL | ORAL | Status: DC | PRN

## 2015-02-10 MED ORDER — HEPARIN SODIUM (PORCINE) 1000 UNIT/ML IJ SOLN
INTRAMUSCULAR | Status: DC | PRN
  Administered 2015-02-10: 6000 [IU] via INTRAVENOUS
  Administered 2015-02-10: 2000 [IU] via INTRAVENOUS

## 2015-02-10 MED ORDER — MAGNESIUM SULFATE 2 GM/50ML IV SOLN
2.0000 g | Freq: Every day | INTRAVENOUS | Status: AC | PRN
  Administered 2015-02-10: 2 g via INTRAVENOUS
  Filled 2015-02-10: qty 50

## 2015-02-10 MED ORDER — ALPRAZOLAM 1 MG PO TABS
1.0000 mg | ORAL_TABLET | Freq: Three times a day (TID) | ORAL | Status: DC | PRN
  Administered 2015-02-12: 1 mg via ORAL
  Filled 2015-02-10: qty 1

## 2015-02-10 MED ORDER — SODIUM CHLORIDE 0.9 % IV SOLN
INTRAVENOUS | Status: DC
  Administered 2015-02-10: 10:00:00 via INTRAVENOUS

## 2015-02-10 MED ORDER — MORPHINE SULFATE 4 MG/ML IJ SOLN: 2.0000 mg | INTRAMUSCULAR | Status: DC | PRN

## 2015-02-10 MED ORDER — ONDANSETRON HCL 4 MG/2ML IJ SOLN
4.0000 mg | Freq: Once | INTRAMUSCULAR | Status: AC
  Administered 2015-02-10 (×2): 4 mg via INTRAVENOUS

## 2015-02-10 MED ORDER — DOPAMINE-DEXTROSE 3.2-5 MG/ML-% IV SOLN
0.0000 ug/kg/min | INTRAVENOUS | Status: DC
  Administered 2015-02-10: 3.996 ug/kg/min via INTRAVENOUS

## 2015-02-10 MED ORDER — OXYCODONE-ACETAMINOPHEN 5-325 MG PO TABS
1.0000 | ORAL_TABLET | ORAL | Status: DC | PRN
  Administered 2015-02-11: 2 via ORAL
  Administered 2015-02-11 (×3): 1 via ORAL
  Administered 2015-02-12: 2 via ORAL
  Filled 2015-02-10: qty 1
  Filled 2015-02-10: qty 2
  Filled 2015-02-10: qty 1
  Filled 2015-02-10: qty 2
  Filled 2015-02-10: qty 1

## 2015-02-10 MED ORDER — HEPARIN (PORCINE) IN NACL 2-0.9 UNIT/ML-% IJ SOLN
INTRAMUSCULAR | Status: AC
  Filled 2015-02-10: qty 1000

## 2015-02-10 MED ORDER — SODIUM CHLORIDE 0.9 % IV SOLN
INTRAVENOUS | Status: DC
  Administered 2015-02-10 (×2): via INTRAVENOUS

## 2015-02-10 MED ORDER — DOCUSATE SODIUM 100 MG PO CAPS
100.0000 mg | ORAL_CAPSULE | Freq: Every day | ORAL | Status: DC
  Administered 2015-02-11 – 2015-02-12 (×2): 100 mg via ORAL
  Filled 2015-02-10 (×2): qty 1

## 2015-02-10 MED ORDER — MIDAZOLAM HCL 2 MG/2ML IJ SOLN
INTRAMUSCULAR | Status: DC | PRN
  Administered 2015-02-10: 1 mg via INTRAVENOUS
  Administered 2015-02-10: 0.5 mg via INTRAVENOUS
  Administered 2015-02-10: 2 mg via INTRAVENOUS
  Administered 2015-02-10: 0.5 mg via INTRAVENOUS

## 2015-02-10 MED ORDER — POTASSIUM CHLORIDE CRYS ER 20 MEQ PO TBCR: 20.0000 meq | EXTENDED_RELEASE_TABLET | Freq: Every day | ORAL | Status: DC | PRN

## 2015-02-10 MED ORDER — CEFAZOLIN SODIUM 1-5 GM-% IV SOLN
1.0000 g | Freq: Once | INTRAVENOUS | Status: AC
  Administered 2015-02-10: 1 g via INTRAVENOUS

## 2015-02-10 MED ORDER — DEXTROSE 5 % IV SOLN
1.5000 g | Freq: Two times a day (BID) | INTRAVENOUS | Status: AC
  Administered 2015-02-10 – 2015-02-11 (×2): 1.5 g via INTRAVENOUS
  Filled 2015-02-10 (×2): qty 1.5

## 2015-02-10 MED ORDER — CARVEDILOL 6.25 MG PO TABS
12.5000 mg | ORAL_TABLET | Freq: Two times a day (BID) | ORAL | Status: DC
  Administered 2015-02-12: 12.5 mg via ORAL
  Filled 2015-02-10: qty 2

## 2015-02-10 MED ORDER — DOPAMINE-DEXTROSE 3.2-5 MG/ML-% IV SOLN
4.0000 ug/kg/min | INTRAVENOUS | Status: DC
  Administered 2015-02-10: 3 ug/kg/min via INTRAVENOUS
  Administered 2015-02-10: 4 ug/kg/min via INTRAVENOUS

## 2015-02-10 MED ORDER — ACETAMINOPHEN 325 MG RE SUPP: 325.0000 mg | RECTAL | Status: DC | PRN

## 2015-02-10 MED ORDER — GUAIFENESIN-DM 100-10 MG/5ML PO SYRP: 15.0000 mL | ORAL_SOLUTION | ORAL | Status: DC | PRN

## 2015-02-10 SURGICAL SUPPLY — 18 items
BALLN VIATRAC 5X30X135 (BALLOONS) ×3
BALLOON VIATRAC 5X30X135 (BALLOONS) ×1 IMPLANT
CATH BEACON 5 .035 100 JB2 TIP (CATHETERS) ×3 IMPLANT
CATH JB1 5.5X125 (CATHETERS) ×3 IMPLANT
CATH PIG 5.0X110 10S (CATHETERS) ×3 IMPLANT
DEVICE CLOSURE MYNXGRIP 6/7F (Vascular Products) ×3 IMPLANT
DEVICE EMBOSHIELD NAV6 4.0-7.0 (WIRE) ×3 IMPLANT
DEVICE PRESTO INFLATION (MISCELLANEOUS) ×3 IMPLANT
DEVICE TORQUE (MISCELLANEOUS) ×3 IMPLANT
GLIDEWIRE ANGLED SS 035X260CM (WIRE) ×3 IMPLANT
KIT CAROTID MANIFOLD (MISCELLANEOUS) ×3 IMPLANT
SET INTRO CAPELLA COAXIAL (SET/KITS/TRAYS/PACK) ×3 IMPLANT
SHEATH BRITE TIP 6FRX11 (SHEATH) ×3 IMPLANT
SHEATH SHUTTLE SELECT 6F (SHEATH) ×3 IMPLANT
STENT XACT CAR 9-7X40X136 (Permanent Stent) ×3 IMPLANT
TOWEL OR 17X26 4PK STRL BLUE (TOWEL DISPOSABLE) ×6 IMPLANT
WIRE G VAS 035X260 STIFF (WIRE) ×3 IMPLANT
WIRE J 3MM .035X145CM (WIRE) ×3 IMPLANT

## 2015-02-10 NOTE — Op Note (Signed)
    OPERATIVE NOTE   PROCEDURE: 1.  ultrasound guidance for vascular access right femoral artery 2.  Placement of a 9 x 7 x 40 Exact stent with the use of the NAV-6 embolic protection device  PRE-OPERATIVE DIAGNOSIS: 1. Symptomatic critical left carotid artery stenosis. 2. TIA with speech difficulty and right hand weakness  POST-OPERATIVE DIAGNOSIS:  Same as above  SURGEON: Dolores Lory Kenyette Gundy  ASSISTANT(S):  Dr. Leotis Pain  ANESTHESIA: local/MCS  ESTIMATED BLOOD LOSS:  100 cc  FINDING(S): 1.   Greater than 90% left carotid artery stenosis  SPECIMEN(S):   none  INDICATIONS:   Patient is a 54 year old woman who presents with  symptomatic carotid stenosis. She is a very high-risk patient secondary to her coronary artery disease and recent lengthy hospital stay for treatment of a sternal wound infection status post CABG. Given her symptomatic carotid stenosis in association with her high risk status she is elected to pursue stenting of the carotid artery. The risks benefits and alternatives were discussed in detail with the patient her husband was present. All questions have been answered. The patient wishes to proceed with intervention.    DESCRIPTION: After obtaining full informed written consent, the patient was brought back to the operating room and placed supine upon the operating table.  The patient received IV antibiotics prior to induction.  After obtaining adequate anesthesia, the patient was prepped and draped in the standard fashion for.   The right femoral artery was visualized with ultrasound and found to be widely patent. It was then accessed under direct ultrasound guidance without difficulty with a Seldinger needle. A permanent image was recorded. A J-wire was placed and we then placed a 6 French sheath. The patient was then heparinized and a total of 8000 units of intravenous heparin were given. A pigtail catheter was then placed into the ascending aorta. This showed a  bovine arch anatomy. I then selectively cannulated the left common carotid with a JB 1 catheter without difficulty with a JB 1 catheter and advanced into the mid left common carotid artery.  Cervical and cerebral carotid angiography was then performed. There were no obvious intracranial filling defects with intracranial injections. The carotid bifurcation demonstrated greater than 90% diameter reduction of the proximal internal carotid artery.  I then advanced into the external carotid artery with a Glidewire and the JB 1 catheter and then exchanged for the Amplatz Super Stiff wire. Over the Amplatz Super Stiff wire, a 6 [place] shuttle sheath was placed into the mid common carotid artery. I then used the NAV-6  Embolic protection device and crossed the lesion and parked this in the distal internal carotid artery at the base of the skull.  I then selected a 9 x 7 x 40 Exact stent. This was deployed across the lesion encompassing it in its entirety. A 5 x 30 length balloon was used to post dilate the stent. Only about a 15% residual stenosis was present after angioplasty. Completion angiogram showed normal intracranial filling without new defects. At this point I elected to terminate the procedure. The sheath was removed and StarClose closure device was deployed in the left femoral artery with excellent hemostatic result. The patient was taken to the recovery room in stable condition having tolerated the procedure well.  COMPLICATIONS: none  CONDITION: stable  Katha Cabal, M.D. 02/10/2015, dictation done at 5:25 PM

## 2015-02-10 NOTE — Plan of Care (Signed)
Problem: Phase I Progression Outcomes Goal: Voiding-avoid urinary catheter unless indicated Outcome: Not Applicable Date Met:  37/85/88 Indwelling catheter will remove in the am Goal: Hemodynamically stable Outcome: Progressing Dopamine drip infusing

## 2015-02-10 NOTE — Progress Notes (Signed)
Only one dose of 4mg  iv zofran given pre procedure today at 1015

## 2015-02-10 NOTE — Progress Notes (Signed)
Pt alert and oriented on 2L O2 via nasal canula currently vss with dopamine infusing; initially upon arrival to ICU pt had respiratory distress with O2 sat decreasing to 60% therefore spoke with Dr Mortimer Fries, Dr Lucky Cowboy, and Dr Delana Meyer labs ordered, chest xray and ekg order; duonebs and pulmocort ordered pt currently resting comfortably with O2 sats at 98% on 2L nasal canula; adequate uop via foley; PAD in place with 30 ml of air per Dr Hal Morales orders will continue to monitor and assess pt

## 2015-02-11 LAB — BASIC METABOLIC PANEL
Anion gap: 5 (ref 5–15)
CHLORIDE: 98 mmol/L — AB (ref 101–111)
CO2: 36 mmol/L — AB (ref 22–32)
Calcium: 8.6 mg/dL — ABNORMAL LOW (ref 8.9–10.3)
Creatinine, Ser: 0.51 mg/dL (ref 0.44–1.00)
GFR calc Af Amer: >60 mL/min (ref >60)
GFR calc non Af Amer: >60 mL/min (ref >60)
GLUCOSE: 119 mg/dL — AB (ref 65–99)
POTASSIUM: 4.4 mmol/L (ref 3.5–5.1)
SODIUM: 139 mmol/L (ref 135–145)

## 2015-02-11 LAB — CBC
HCT: 30 % — ABNORMAL LOW (ref 35.0–47.0)
Hemoglobin: 9.2 g/dL — ABNORMAL LOW (ref 12.0–16.0)
MCH: 23.5 pg — AB (ref 26.0–34.0)
MCHC: 30.7 g/dL — AB (ref 32.0–36.0)
MCV: 76.6 fL — AB (ref 80.0–100.0)
PLATELETS: 296 10*3/uL (ref 150–440)
RBC: 3.92 MIL/uL (ref 3.80–5.20)
RDW: 20.3 % — ABNORMAL HIGH (ref 11.5–14.5)
WBC: 7.1 10*3/uL (ref 3.6–11.0)

## 2015-02-11 LAB — MAGNESIUM: Magnesium: 1.5 mg/dL — ABNORMAL LOW (ref 1.7–2.4)

## 2015-02-11 MED ORDER — MAGNESIUM SULFATE 2 GM/50ML IV SOLN
2.0000 g | Freq: Once | INTRAVENOUS | Status: AC
  Administered 2015-02-11: 2 g via INTRAVENOUS
  Filled 2015-02-11: qty 50

## 2015-02-11 MED ORDER — FAMOTIDINE 20 MG PO TABS
20.0000 mg | ORAL_TABLET | Freq: Two times a day (BID) | ORAL | Status: DC
  Administered 2015-02-11 – 2015-02-12 (×3): 20 mg via ORAL
  Filled 2015-02-11 (×3): qty 1

## 2015-02-11 MED ORDER — CETYLPYRIDINIUM CHLORIDE 0.05 % MT LIQD
7.0000 mL | Freq: Two times a day (BID) | OROMUCOSAL | Status: DC
  Administered 2015-02-11: 7 mL via OROMUCOSAL

## 2015-02-11 MED ORDER — BIOTENE DRY MOUTH MT LIQD
15.0000 mL | Freq: Two times a day (BID) | OROMUCOSAL | Status: DC
  Administered 2015-02-11: 15 mL via OROMUCOSAL
  Filled 2015-02-11 (×3): qty 15

## 2015-02-11 MED ORDER — FUROSEMIDE 10 MG/ML IJ SOLN
20.0000 mg | Freq: Once | INTRAMUSCULAR | Status: AC
  Administered 2015-02-11: 20 mg via INTRAVENOUS
  Filled 2015-02-11: qty 2

## 2015-02-11 MED ORDER — ENSURE ENLIVE PO LIQD
237.0000 mL | Freq: Every day | ORAL | Status: DC
  Administered 2015-02-11: 237 mL via ORAL

## 2015-02-11 MED ORDER — TIOTROPIUM BROMIDE MONOHYDRATE 18 MCG IN CAPS
18.0000 ug | ORAL_CAPSULE | Freq: Every day | RESPIRATORY_TRACT | Status: DC
  Administered 2015-02-11: 18 ug via RESPIRATORY_TRACT
  Filled 2015-02-11: qty 5

## 2015-02-11 NOTE — Progress Notes (Signed)
[place] NOTE  Pharmacy Consult for Electrolyte Management  Indication: ICU Patient   Not on File  Patient Measurements: Height: 5\' 1"  (154.9 cm) Weight: 158 lb 1.1 oz (71.7 kg) IBW/kg (Calculated) : 47.8   Vital Signs: Temp: 97.6 F (36.4 C) (05/12 1130) Temp Source: Oral (05/12 1130) BP: 100/53 mmHg (05/12 1130) Pulse Rate: 87 (05/12 1130) Intake/Output from previous day: 05/11 0701 - 05/12 0700 In: 1836.3 [I.V.:1586.3; IV Piggyback:250] Out: 1150 [Urine:1150] Intake/Output from this shift: Total I/O In: 75 [I.V.:75] Out: 450 [Urine:450] Vent settings for last 24 hours:    Labs:  Recent Labs  02/10/15 0921 02/10/15 1723 02/11/15 0641  WBC  --  13.0* 7.1  HGB  --  8.9* 9.2*  HCT  --  29.5* 30.0*  PLT  --  307 296  CREATININE 0.57 0.54 0.51  MG  --  1.1* 1.5*  PHOS  --  3.8  --    Estimated Creatinine Clearance: 75.4 mL/min (by C-G formula based on Cr of 0.51).   Recent Labs  02/10/15 [place]    Microbiology: No results found for this or any previous visit (from the past 720 hour(s)).  Medications:  Scheduled:  . antiseptic oral rinse  7 mL Mouth Rinse Q12H  . aspirin EC  81 mg Oral Daily  . atorvastatin  80 mg Oral q1800  . budesonide (PULMICORT) nebulizer solution  0.5 mg Nebulization BID  . carvedilol  12.5 mg Oral BID WC  . clopidogrel  75 mg Oral Daily  . docusate sodium  100 mg Oral Daily  . famotidine  20 mg Oral BID  . feeding supplement (ENSURE ENLIVE)  237 mL Oral Q1500  . furosemide  20 mg Intravenous Daily  . mometasone-formoterol  2 puff Inhalation BID  . tiotropium  18 mcg Inhalation Daily  . traZODone  50 mg Oral QHS   Infusions:  . DOPamine 1 mcg/kg/min (02/11/15 0705)  . DOPamine 1.5 mcg/kg/min (02/11/15 0000)    Assessment: Electrolytes are WNL.   Plan:  Will obtain Basic Metabolic Panel, magnesium, and phosphate with am labs. Will replace per protocol.    Pharmacy will continue to  monitor and adjust per consult.    Simpson,Michael L 02/11/2015,2:13 PM

## 2015-02-11 NOTE — Progress Notes (Signed)
Patient stable overnight. Minimal expressive aphasia. A&O. Diminished lungs on 2L O2. PAD on right groin d/t oozing. F/C removed this am. VSS. SR on monitor. Dopamine@2  See charting for assessments. Kirsten Castro 02/11/2015 5:06 AM

## 2015-02-11 NOTE — Consult Note (Signed)
PULMONARY / CRITICAL CARE MEDICINE   Name: Kirsten Castro MRN: [id number] DOB: 05-20-1961    ADMISSION DATE:  02/10/2015      CHIEF COMPLAINT:   Acute SOB   HISTORY OF PRESENT ILLNESS   54 yo white female with h/o tobacco abuse admitted to ICU for post op shock from s/p CEA. Patient with increased WOB last night with wheezing, patient sitting in chair alert and awake, seems to be slightly SOB, wheezing has improved since last night, patient on minimal oxygen therapy.   Patient started on dounebs and pulmicort nebs, patient weaned off vasopressors, patient given lasix, cardiology consult pending    SIGNIFICANT EVENTS    S/ CEA 02/10/15   PAST MEDICAL HISTORY    :  Past Medical History  Diagnosis Date  . Stroke   . Coronary artery disease   . COPD (chronic obstructive pulmonary disease)   . Myocardial infarction   . Anginal pain    Past Surgical History  Procedure Laterality Date  . Coronary artery bypass graft     Prior to Admission medications   Medication Sig Start Date End Date Taking? Authorizing Provider  ALPRAZolam (XANAX XR) 1 MG 24 hr tablet Take 1 mg by mouth daily.   Yes Historical Provider, MD  aspirin 81 MG EC tablet Take 81 mg by mouth daily. Swallow whole.   Yes Historical Provider, MD  atorvastatin (LIPITOR) 10 MG tablet Take 80 mg by mouth daily.   Yes Historical Provider, MD  carvedilol (COREG) 12.5 MG tablet Take 12.5 mg by mouth 2 (two) times daily with a meal.   Yes Historical Provider, MD  fenofibrate (TRICOR) 145 MG tablet Take 145 mg by mouth daily.   Yes Historical Provider, MD  Fluticasone-Salmeterol (ADVAIR) 250-50 MCG/DOSE AEPB Inhale 1 puff into the lungs 2 (two) times daily.   Yes Historical Provider, MD  furosemide (LASIX) 40 MG tablet Take 40 mg by mouth.   Yes Historical Provider, MD  lisinopril-hydrochlorothiazide (PRINZIDE,ZESTORETIC) 20-12.5 MG per tablet Take 1 tablet by mouth daily.   Yes Historical Provider, MD   omeprazole (PRILOSEC) 40 MG capsule Take 40 mg by mouth daily.   Yes Historical Provider, MD  potassium chloride SA (K-DUR,KLOR-CON) 20 MEQ tablet Take 20 mEq by mouth daily.   Yes Historical Provider, MD  traZODone (DESYREL) 50 MG tablet Take 50 mg by mouth at bedtime.   Yes Historical Provider, MD   Not on File   FAMILY HISTORY   History reviewed. No pertinent family history.    SOCIAL HISTORY    reports that she has never smoked. She does not have any smokeless tobacco history on file. She reports that she does not drink alcohol or use illicit drugs.  Review of Systems  Constitutional: Negative for fever, chills, weight loss, malaise/fatigue and diaphoresis.  HENT: Negative for congestion and hearing loss.   Eyes: Negative for blurred vision and double vision.  Respiratory: Positive for shortness of breath and wheezing. Negative for cough, hemoptysis and sputum production.   Cardiovascular: Negative for chest pain, palpitations and orthopnea.  Gastrointestinal: Negative for heartburn, nausea, vomiting, abdominal pain, diarrhea, constipation and blood in stool.  Genitourinary: Negative for dysuria and urgency.  Musculoskeletal: Negative for myalgias, back pain and neck pain.  Skin: Negative for rash.  Neurological: Negative for dizziness, tingling, tremors, weakness and headaches.  Endo/Heme/Allergies: Does not bruise/bleed easily.  Psychiatric/Behavioral: Negative for depression, suicidal ideas and substance abuse.  All other systems reviewed and are negative.  VITAL SIGNS    Temp:  [97.5 F (36.4 C)-98.9 F (37.2 C)] 97.5 F (36.4 C) (05/12 0700) Pulse Rate:  [85-100] 86 (05/12 0854) Resp:  [16-41] 37 (05/12 0854) BP: (82-149)/(49-91) 119/53 mmHg (05/12 0830) SpO2:  [95 %-100 %] 99 % (05/12 0854) Weight:  [158 lb 1.1 oz (71.7 kg)] 158 lb 1.1 oz (71.7 kg) (05/12 0554) HEMODYNAMICS:   VENTILATOR SETTINGS:   INTAKE / OUTPUT:  Intake/Output Summary (Last 24  hours) at 02/11/15 0920 Last data filed at 02/11/15 0800  Gross per 24 hour  Intake 1911.25 ml  Output   1150 ml  Net 761.25 ml       PHYSICAL EXAM   Physical Exam  Constitutional: She is oriented to person, place, and time. She appears well-developed and well-nourished. She appears distressed.  HENT:  Head: Normocephalic and atraumatic.  Mouth/Throat: No oropharyngeal exudate.  Eyes: EOM are normal. Pupils are equal, round, and reactive to light. No scleral icterus.  Neck: Normal range of motion. Neck supple.  Cardiovascular: Normal rate, regular rhythm and normal heart sounds.   No murmur heard. Pulmonary/Chest: No stridor. She is in respiratory distress. She has wheezes.  Abdominal: Soft. Bowel sounds are normal. She exhibits no distension. There is no tenderness. There is no rebound.  Musculoskeletal: Normal range of motion. She exhibits no edema.  Neurological: She is alert and oriented to person, place, and time. She displays normal reflexes. Coordination normal.  Skin: Skin is warm. She is diaphoretic.  Psychiatric: She has a normal mood and affect.       LABS   LABS:  CBC  Recent Labs Lab 02/10/15 1723 02/11/15 0641  WBC 13.0* 7.1  HGB 8.9* 9.2*  HCT 29.5* 30.0*  PLT 307 296   Coag's No results for input(s): APTT, INR in the last 168 hours. BMET  Recent Labs Lab 02/10/15 0921 02/10/15 1723 02/11/15 0641  NA  --  138 139  K  --  4.1 4.4  CL  --  101 98*  CO2  --  31 36*  BUN 8 6 <5*  CREATININE 0.57 0.54 0.51  GLUCOSE  --  122* 119*   Electrolytes  Recent Labs Lab 02/10/15 1723 02/11/15 0641  CALCIUM 8.3* 8.6*  MG 1.1*  --   PHOS 3.8  --    Sepsis Markers No results for input(s): LATICACIDVEN, PROCALCITON, O2SATVEN in the last 168 hours. ABG No results for input(s): PHART, PCO2ART, PO2ART in the last 168 hours. Liver Enzymes No results for input(s): AST, ALT, ALKPHOS, BILITOT, ALBUMIN in the last 168 hours. Cardiac  Enzymes  Recent Labs Lab 02/10/15 1723  TROPONINI <0.03   Glucose  Recent Labs Lab 02/10/15 1645  GLUCAP 108*     No results found for this or any previous visit (from the past 240 hour(s)).    IMAGING    Dg Chest 1 View  02/10/2015   CLINICAL DATA:  Respiratory distress.  EXAM: CHEST  1 VIEW  COMPARISON:  March 22, 2012.  FINDINGS: The heart size and mediastinal contours are within normal limits. Both lungs are clear. No pneumothorax or pleural effusion is noted. Status post coronary artery bypass graft. The visualized skeletal structures are unremarkable.  IMPRESSION: No acute cardiopulmonary abnormality seen.   Electronically Signed   By: Marijo Conception, M.D.   On: 02/10/2015 20:25  ASSESSMENT/PLAN    54 yo white female admitted to ICU for post op shock with COPD exacerbation   1.wean off vasopressors 2.oxygen as needed 3.will start spiriva, continue dulera 4.continue albuterol nebs, pulmicort nebs 5.lasix as tolerated 6.await cardiology consult  Aggressive PT, Ok to transfer to floor later this afternoon if patient stays off vasopressors.        I have personally obtained a history, examined the patient, evaluated laboratory and imaging results, formulated the assessment and plan and placed orders.  The Patient requires high complexity decision making for assessment and support, frequent evaluation and titration of therapies, application of advanced monitoring technologies and extensive interpretation of multiple databases.   Corrin Parker, M.D. Pulmonary & [place] Director Intensive Care Unit   02/11/2015, 9:20 AM

## 2015-02-11 NOTE — Progress Notes (Signed)
Initial Nutrition Assessment  DOCUMENTATION CODES:     INTERVENTION:  Meals and Snacks: Cater to patient preferences Medical Food Supplement Therapy: will recommend Ensure Enlive (each supplement provides 350 kcal and 20 grams of protein) daily  NUTRITION DIAGNOSIS:  Inadequate oral intake related to inability to eat as evidenced by other (see comment) (NPO on admission for procedure).  GOAL:  Patient will meet greater than or equal to 90% of their needs  MONITOR:  Energy Intake Anthropometrics Electrolyte and Renal Profile Pulmonary Profile  REASON FOR ASSESSMENT:  Malnutrition Screening Tool    ASSESSMENT:  Pt admitted with carotid stenosis s/p stent placement PMHx:  Stroke, CAD, COPD, MI, anginal pain  PO Intake: pt ate 100% of gravy and biscuits brought in by husband from community this am. Pt reports po intake was poor with extended hospital stay this past fall (October 2015-February 2016). But since then appetite improved and has been doing well recently. Pt reports drinking Ensure daily.  Medications: Lasix, MgS given, KCl, Lasix, Dopamine drip Labs: Electrolyte and Renal Profile:    Recent Labs Lab 02/10/15 0921 02/10/15 1723 02/11/15 0641  BUN 8 6 <5*  CREATININE 0.57 0.54 0.51  NA  --  138 139  K  --  4.1 4.4  MG  --  1.1* 1.5*  PHOS  --  3.8  --    Glucose Profile:  Recent Labs  02/10/15 1645  GLUCAP 108*   Protein Profile: No results for input(s): ALBUMIN in the last 168 hours.   Pt reports weight loss of 20lbs from October 2015-February 2016, but since then it has remained stable.  Height:  Ht Readings from Last 1 Encounters:  02/10/15 5\' 1"  (1.549 m)    Weight:  Wt Readings from Last 1 Encounters:  02/11/15 158 lb 1.1 oz (71.7 kg)    Ideal Body Weight:     Wt Readings from Last 10 Encounters:  02/11/15 158 lb 1.1 oz (71.7 kg)   Nutrition-Focused physical exam completed. Findings are WDL for fat depletion, muscle  depletion, and edema.   BMI:  Body mass index is 29.88 kg/(m^2).  Diet Order:  Diet regular Room service appropriate?: Yes; Fluid consistency:: Thin  EDUCATION NEEDS:  No education needs identified at this time   Intake/Output Summary (Last 24 hours) at 02/11/15 1239 Last data filed at 02/11/15 1100  Gross per 24 hour  Intake 1911.25 ml  Output   1600 ml  Net 311.25 ml    Last BM:  5/11  [place], RD, LDN Pager [phone]

## 2015-02-11 NOTE — Progress Notes (Signed)
Pt alert and oriented; on 2L O2 via nasal canula attempted to transition pt to RA twice during shift however pts O2 sats decreased to 82% notified Dr Mortimer Fries he acknowledged and gave orders for an additional dose of 20 mg iv push once and hold the beta blocker; vss; adequate uop; c/o pain administered medication per md orders pt received relief; pt tolerating diet; will continue to monitor and assess pt

## 2015-02-12 LAB — BASIC METABOLIC PANEL
Anion gap: 5 (ref 5–15)
BUN: 11 mg/dL (ref 6–20)
CHLORIDE: 97 mmol/L — AB (ref 101–111)
CO2: 36 mmol/L — AB (ref 22–32)
Calcium: 8.4 mg/dL — ABNORMAL LOW (ref 8.9–10.3)
Creatinine, Ser: 0.49 mg/dL (ref 0.44–1.00)
GFR calc non Af Amer: >60 mL/min (ref >60)
GLUCOSE: 109 mg/dL — AB (ref 65–99)
POTASSIUM: 4.1 mmol/L (ref 3.5–5.1)
SODIUM: 138 mmol/L (ref 135–145)

## 2015-02-12 LAB — PHOSPHORUS: Phosphorus: 3 mg/dL (ref 2.5–4.6)

## 2015-02-12 LAB — MAGNESIUM: Magnesium: 1.5 mg/dL — ABNORMAL LOW (ref 1.7–2.4)

## 2015-02-12 MED ORDER — MAGNESIUM SULFATE 2 GM/50ML IV SOLN
2.0000 g | Freq: Once | INTRAVENOUS | Status: AC
  Administered 2015-02-12: 2 g via INTRAVENOUS
  Filled 2015-02-12: qty 50

## 2015-02-12 MED ORDER — CLOPIDOGREL BISULFATE 75 MG PO TABS: 75.0000 mg | ORAL_TABLET | Freq: Every day | ORAL | Status: DC

## 2015-02-12 MED ORDER — TIOTROPIUM BROMIDE MONOHYDRATE 2.5 MCG/ACT IN AERS: 2.0000 | INHALATION_SPRAY | Freq: Every day | RESPIRATORY_TRACT | Status: DC

## 2015-02-12 NOTE — Progress Notes (Signed)
At rest

## 2015-02-12 NOTE — Progress Notes (Signed)
SATURATION QUALIFICATIONS: (This note is used to comply with regulatory documentation for home oxygen)  Patient Saturations on Room Air at Rest = 82%  Patient Saturations on Room Air while Ambulating = 78%  Patient Saturations on1 Liters of oxygen while Ambulating = 95%  Please briefly explain why patient needs home oxygen:

## 2015-02-12 NOTE — Progress Notes (Signed)
Pt on room air up to bsc.

## 2015-02-12 NOTE — Progress Notes (Signed)
Patient stable overnight. A&O clear lungs on 2l O2. VSS. SR on monitor. Does continue to drop oxygen saturation when nasal cannula is removed. See charting for assessments. Vascular site WDL.

## 2015-02-12 NOTE — Clinical Documentation Improvement (Signed)
   MD's, NP's, and PA's  Noted patient unable to wean off of 2L post procedure with sats dropping to 60% and 82% (per nursing notes), SOB / WOB (per MD's notes)   Respirations (21-40)   oxygen increased to 4 L per nasal cannula.  Please document clinical condition if appropriate for this admission. Thank you     Possible Clinical Conditions?  Acute Respiratory Failure Acute Respiratory Distress Acute Respiratory Insufficiency following surgery  Other Condition Cannot Clinically Determine   Supporting Information: Risk Factors: s/p CEA, COPD exacerbation , post operative shock  Signs & Symptoms:  Resp 21-40, sats 60%,  82%  Diagnostics: chest xray   Treatment: Pulmicort inhaler, spiriva started, increase Oxygen to 4 L , IV lasix   Thank You, Ree Kida ,RN Clinical Documentation Specialist:  [phone]  [facility] [employer]

## 2015-02-12 NOTE — Consult Note (Signed)
PULMONARY / CRITICAL CARE MEDICINE   Name: Kirsten Castro MRN: [id number] DOB: [birth year]    ADMISSION DATE:  02/10/2015       Follow  Up    Remains SOB but much improved, needs oxygen for discharge(ordered) To be started on spiriva   SIGNIFICANT EVENTS    S/ CEA 02/10/15   PAST MEDICAL HISTORY    :  Past Medical History  Diagnosis Date  . Stroke   . Coronary artery disease   . COPD (chronic obstructive pulmonary disease)   . Myocardial infarction   . Anginal pain    Past Surgical History  Procedure Laterality Date  . Coronary artery bypass graft     Prior to Admission medications   Medication Sig Start Date End Date Taking? Authorizing Provider  ALPRAZolam (XANAX XR) 1 MG 24 hr tablet Take 1 mg by mouth daily.   Yes Historical Provider, MD  aspirin 81 MG EC tablet Take 81 mg by mouth daily. Swallow whole.   Yes Historical Provider, MD  atorvastatin (LIPITOR) 10 MG tablet Take 80 mg by mouth daily.   Yes Historical Provider, MD  carvedilol (COREG) 12.5 MG tablet Take 12.5 mg by mouth 2 (two) times daily with a meal.   Yes Historical Provider, MD  fenofibrate (TRICOR) 145 MG tablet Take 145 mg by mouth daily.   Yes Historical Provider, MD  Fluticasone-Salmeterol (ADVAIR) 250-50 MCG/DOSE AEPB Inhale 1 puff into the lungs 2 (two) times daily.   Yes Historical Provider, MD  furosemide (LASIX) 40 MG tablet Take 40 mg by mouth.   Yes Historical Provider, MD  lisinopril-hydrochlorothiazide (PRINZIDE,ZESTORETIC) 20-12.5 MG per tablet Take 1 tablet by mouth daily.   Yes Historical Provider, MD  omeprazole (PRILOSEC) 40 MG capsule Take 40 mg by mouth daily.   Yes Historical Provider, MD  potassium chloride SA (K-DUR,KLOR-CON) 20 MEQ tablet Take 20 mEq by mouth daily.   Yes Historical Provider, MD  traZODone (DESYREL) 50 MG tablet Take 50 mg by mouth at bedtime.   Yes Historical Provider, MD   Not on File   FAMILY HISTORY   History reviewed. No pertinent family  history.    SOCIAL HISTORY    reports that she has never smoked. She does not have any smokeless tobacco history on file. She reports that she does not drink alcohol or use illicit drugs.  Review of Systems  Constitutional: Negative for fever, chills, weight loss, malaise/fatigue and diaphoresis.  HENT: Negative for congestion and hearing loss.   Eyes: Negative for blurred vision and double vision.  Respiratory: Positive for shortness of breath and wheezing. Negative for cough, hemoptysis and sputum production.   Cardiovascular: Negative for chest pain, palpitations and orthopnea.  Gastrointestinal: Negative for heartburn, nausea, vomiting, abdominal pain, diarrhea, constipation and blood in stool.  Genitourinary: Negative for dysuria and urgency.  Musculoskeletal: Negative for myalgias, back pain and neck pain.  Skin: Negative for rash.  Neurological: Negative for dizziness, tingling, tremors, weakness and headaches.  Endo/Heme/Allergies: Does not bruise/bleed easily.  Psychiatric/Behavioral: Negative for depression, suicidal ideas and substance abuse.  All other systems reviewed and are negative.     VITAL SIGNS    Temp:  [97.6 F (36.4 C)-98.6 F (37 C)] 97.6 F (36.4 C) (05/13 0800) Pulse Rate:  [85-103] 95 (05/13 1036) Resp:  [15-34] 28 (05/13 1036) BP: (90-148)/(49-78) 123/62 mmHg (05/13 1000) SpO2:  [78 %-100 %] 92 % (05/13 1036) HEMODYNAMICS:   VENTILATOR SETTINGS:   INTAKE / OUTPUT:  Intake/Output  Summary (Last 24 hours) at 02/12/15 1112 Last data filed at 02/12/15 0612  Gross per 24 hour  Intake 163.95 ml  Output    700 ml  Net -536.05 ml       PHYSICAL EXAM   Physical Exam  Constitutional: She is oriented to person, place, and time. She appears well-developed and well-nourished. HENT:  Head: Normocephalic and atraumatic.  Mouth/Throat: No oropharyngeal exudate.  Eyes: EOM are normal. Pupils are equal, round, and reactive to light. No scleral  icterus.  Neck: Normal range of motion. Neck supple.  Cardiovascular: Normal rate, regular rhythm and normal heart sounds.   No murmur heard. Pulmonary/Chest: No stridor. She has wheezes. +SOB Abdominal: Soft. Bowel sounds are normal. She exhibits no distension. There is no tenderness. There is no rebound.  Musculoskeletal: Normal range of motion. She exhibits no edema.  Neurological: She is alert and oriented to person, place, and time. She displays normal reflexes. Coordination normal.  Skin: Skin is warm. Psychiatric: She has a normal mood and affect.       LABS   LABS:  CBC  Recent Labs Lab 02/10/15 1723 02/11/15 0641  WBC 13.0* 7.1  HGB 8.9* 9.2*  HCT 29.5* 30.0*  PLT 307 296   Coag's No results for input(s): APTT, INR in the last 168 hours. BMET  Recent Labs Lab 02/10/15 1723 02/11/15 0641 02/12/15 0435  NA 138 139 138  K 4.1 4.4 4.1  CL 101 98* 97*  CO2 31 36* 36*  BUN 6 <5* 11  CREATININE 0.54 0.51 0.49  GLUCOSE 122* 119* 109*   Electrolytes  Recent Labs Lab 02/10/15 1723 02/11/15 0641 02/12/15 0435  CALCIUM 8.3* 8.6* 8.4*  MG 1.1* 1.5* 1.5*  PHOS 3.8  --  3.0   Sepsis Markers No results for input(s): LATICACIDVEN, PROCALCITON, O2SATVEN in the last 168 hours. ABG No results for input(s): PHART, PCO2ART, PO2ART in the last 168 hours. Liver Enzymes No results for input(s): AST, ALT, ALKPHOS, BILITOT, ALBUMIN in the last 168 hours. Cardiac Enzymes  Recent Labs Lab 02/10/15 1723  TROPONINI <0.03   Glucose  Recent Labs Lab 02/10/15 1645  GLUCAP 108*     No results found for this or any previous visit (from the past 240 hour(s)).                    ASSESSMENT/PLAN    54 yo white female admitted to ICU for post op shock with COPD exacerbation   1.weaned off vasopressors 2.oxygen as needed, ordered for home use 3.spiriva, continue dulera as inpatient but takes Advair at home 4.continue albuterol nebs as  outpatient   Aggressive PT, Ok to transfer to floor later this afternoon if patient stays off vasopressors. Probable D/c home. No further recommendation noted    I have personally obtained a history, examined the patient, evaluated laboratory and imaging results, formulated the assessment and plan and placed orders.  The Patient requires high complexity decision making for assessment and support, frequent evaluation and titration of therapies, application of advanced monitoring technologies and extensive interpretation of multiple databases.   Corrin Parker, M.D. Pulmonary & [place] Director Intensive Care Unit   02/12/2015, 11:12 AM

## 2015-02-12 NOTE — Progress Notes (Signed)
Dr Mortimer Fries requested home 02, pt qualified, sat findings noted.  In to speak with patient and her husband who verbalized understanding of the plan for home oxygen.  Will with [facility] notified of referral. Georgana Curio RN CCM [facility]

## 2015-02-12 NOTE — Progress Notes (Signed)
Pt dc home. dsg CDI. Vss. Follow up appointments clear and understood.  Meds and rx clearly understood. Portable O2 with patient.

## 2015-02-16 NOTE — Discharge Summary (Signed)
[facility]    Discharge Summary    Patient ID:  Kirsten Castro MRN: [id number] DOB/AGE: August 24, 1961 54 y.o.  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 Stroke   . Coronary artery disease   . COPD (chronic obstructive pulmonary disease)   . Myocardial infarction   . Anginal pain     Procedure(s): Carotid PTA/Stent Intervention  Discharged Condition: good  HPI:  Patient is admitted for intervention on her left internal carotid artery. She has had several documented TIAs with speech deficits and weakness of her right arm. Noninvasive studies as well as CT angiography demonstrated critical stenosis of the left internal carotid artery. Because she is symptomatic and a very high risk secondary to her cardiac status carotid stenting is been selected as the optimal method of treatment. The risks and benefits of been reviewed with the patient as well as her husband all questions have been answered the patient wishes to proceed with carotid stenting.  Hospital Course:  Kirsten Castro is a 54 y.o. female is admitted to [facility] percutaneous transluminal and plasty and stent placement of the left internal carotid artery. During the procedure she did require one dose of atropine as well as a fluid bolus to maintain her mean pressure greater than 60 and her heart rate and 60 as well. Subsequently dopamine was started to maintain adequate perfusion. Over the course of the next 48 hours she weaned from her dopamine. She did not exhibit any neurological changes throughout her hospitalization. Once her hemodynamics had stabilized and she was weaned off her pressors she  was tolerating a regular diet she was ambulating without assistance. On postoperative day 2 she was felt fit for discharge. She is discharged home.   Complications:none  Consults:     Significant Diagnostic Studies: CBC Lab Results  Component Value Date   WBC 7.1 02/11/2015   HGB 9.2* 02/11/2015   HCT 30.0* 02/11/2015   MCV 76.6* 02/11/2015   PLT 296 02/11/2015    BMET    Component Value Date/Time   NA 138 02/12/2015 0435   NA 137 12/30/2013 2351   K 4.1 02/12/2015 0435   K 3.1* 12/30/2013 2351   CL 97* 02/12/2015 0435   CL 105 12/30/2013 2351   CO2 36* 02/12/2015 0435   CO2 25 12/30/2013 2351   GLUCOSE 109* 02/12/2015 0435   GLUCOSE 146* 12/30/2013 2351   BUN 11 02/12/2015 0435   BUN 13 02/17/2014 0758   CREATININE 0.49 02/12/2015 0435   CREATININE 1.02 02/17/2014 0758   CALCIUM 8.4* 02/12/2015 0435   CALCIUM 9.1 12/30/2013 2351   GFRNONAA >60 02/12/2015 0435   GFRNONAA >60 02/17/2014 0758   GFRAA >60 02/12/2015 0435   GFRAA >60 02/17/2014 0758   COAG No results found for: INR, PROTIME   Disposition:  Discharge to :Home Discharge Instructions    Call MD for:  redness, tenderness, or signs of infection (pain, swelling, bleeding, redness, odor or green/yellow discharge around incision site)    Complete by:  As directed      Call MD for:  severe or increased pain, loss or decreased feeling  in affected limb(s)    Complete by:  As directed      Call MD  for:  temperature >100.5    Complete by:  As directed      Driving Restrictions    Complete by:  As directed   No driving for 2 weeks     Lifting restrictions    Complete by:  As directed   No lifting for 2 weeks     Resume previous diet    Complete by:  As directed             Medication List    TAKE these medications        ALPRAZolam 1 MG 24 hr tablet  Commonly known as:  XANAX XR  Take 1 mg by mouth daily.     aspirin 81 MG EC tablet  Take 81 mg by mouth daily. Swallow whole.      atorvastatin 10 MG tablet  Commonly known as:  LIPITOR  Take 80 mg by mouth daily.     carvedilol 12.5 MG tablet  Commonly known as:  COREG  Take 12.5 mg by mouth 2 (two) times daily with a meal.     clopidogrel 75 MG tablet  Commonly known as:  PLAVIX  Take 1 tablet (75 mg total) by mouth daily.     fenofibrate 145 MG tablet  Commonly known as:  TRICOR  Take 145 mg by mouth daily.     Fluticasone-Salmeterol 250-50 MCG/DOSE Aepb  Commonly known as:  ADVAIR  Inhale 1 puff into the lungs 2 (two) times daily.  Notes to Patient:  Use two times daily, not as needed     furosemide 40 MG tablet  Commonly known as:  LASIX  Take 40 mg by mouth.     lisinopril-hydrochlorothiazide 20-12.5 MG per tablet  Commonly known as:  PRINZIDE,ZESTORETIC  Take 1 tablet by mouth daily.  Notes to Patient:  Use caution. Call your doctor if you have dizziness     omeprazole 40 MG capsule  Commonly known as:  PRILOSEC  Take 40 mg by mouth daily.     potassium chloride SA 20 MEQ tablet  Commonly known as:  K-DUR,KLOR-CON  Take 20 mEq by mouth daily.     Tiotropium Bromide Monohydrate 2.5 MCG/ACT Aers  Commonly known as:  SPIRIVA RESPIMAT  Inhale 2 puffs into the lungs daily.     traZODone 50 MG tablet  Commonly known as:  DESYREL  Take 50 mg by mouth at bedtime.       Verbal and written Discharge instructions given to the patient. Wound care per Discharge AVS     Follow-up Information    Follow up with Piccola Arico, Dolores Lory, MD On 02/22/2015.   Specialties:  Vascular Surgery, Cardiology, Radiology, Vascular Surgery   Why:  follow up after procedure Monday May 23 at 1:30pm   Contact information:   [place] [address] [phone]       Follow up with Adrian Prows, MD. Go on 02/22/2015.   Specialty:  Infectious Diseases   Why:  For Follow up on Monday May 23 at 8:30am   Contact information:   [place] [address] [phone]        Signed: Katha Cabal, MD  02/16/2015, 12:14 PM

## 2015-02-18 ENCOUNTER — Encounter: Payer: Self-pay | Admitting: Vascular Surgery

## 2015-02-25 DIAGNOSIS — D519 Vitamin B12 deficiency anemia, unspecified: Secondary | ICD-10-CM | POA: Insufficient documentation

## 2015-02-25 DIAGNOSIS — D649 Anemia, unspecified: Secondary | ICD-10-CM | POA: Insufficient documentation

## 2015-11-20 ENCOUNTER — Emergency Department
Admission: EM | Admit: 2015-11-20 | Discharge: 2015-11-20 | Disposition: A | Discharge status: Home / Self Care | Payer: BLUE CROSS/BLUE SHIELD | Attending: Emergency Medicine | Admitting: Emergency Medicine

## 2015-11-20 ENCOUNTER — Encounter: Payer: Self-pay | Admitting: *Deleted

## 2015-11-20 ENCOUNTER — Emergency Department: Payer: BLUE CROSS/BLUE SHIELD

## 2015-11-20 DIAGNOSIS — I10 Essential (primary) hypertension: Secondary | ICD-10-CM | POA: Diagnosis not present

## 2015-11-20 DIAGNOSIS — Z87891 Personal history of nicotine dependence: Secondary | ICD-10-CM | POA: Insufficient documentation

## 2015-11-20 DIAGNOSIS — R0602 Shortness of breath: Secondary | ICD-10-CM | POA: Diagnosis present

## 2015-11-20 DIAGNOSIS — B372 Candidiasis of skin and nail: Secondary | ICD-10-CM | POA: Diagnosis not present

## 2015-11-20 DIAGNOSIS — J441 Chronic obstructive pulmonary disease with (acute) exacerbation: Secondary | ICD-10-CM | POA: Insufficient documentation

## 2015-11-20 DIAGNOSIS — B369 Superficial mycosis, unspecified: Secondary | ICD-10-CM | POA: Diagnosis not present

## 2015-11-20 HISTORY — DX: Essential (primary) hypertension: I10

## 2015-11-20 LAB — BASIC METABOLIC PANEL
ANION GAP: 4 — AB (ref 5–15)
BUN: 29 mg/dL — ABNORMAL HIGH (ref 6–20)
CHLORIDE: 109 mmol/L (ref 101–111)
CO2: 30 mmol/L (ref 22–32)
Calcium: 9.7 mg/dL (ref 8.9–10.3)
Creatinine, Ser: 0.71 mg/dL (ref 0.44–1.00)
GFR calc non Af Amer: >60 mL/min (ref >60)
Glucose, Bld: 124 mg/dL — ABNORMAL HIGH (ref 65–99)
POTASSIUM: 4.7 mmol/L (ref 3.5–5.1)
SODIUM: 143 mmol/L (ref 135–145)

## 2015-11-20 LAB — CBC
HCT: 45.5 % (ref 35.0–47.0)
Hemoglobin: 15.1 g/dL (ref 12.0–16.0)
MCH: 31.3 pg (ref 26.0–34.0)
MCHC: 33.1 g/dL (ref 32.0–36.0)
MCV: 94.4 fL (ref 80.0–100.0)
PLATELETS: 255 10*3/uL (ref 150–440)
RBC: 4.82 MIL/uL (ref 3.80–5.20)
RDW: 18.7 % — AB (ref 11.5–14.5)
WBC: 8.8 10*3/uL (ref 3.6–11.0)

## 2015-11-20 LAB — TROPONIN I

## 2015-11-20 LAB — BRAIN NATRIURETIC PEPTIDE: B NATRIURETIC PEPTIDE 5: 67 pg/mL (ref 0.0–100.0)

## 2015-11-20 MED ORDER — PREDNISONE 10 MG (21) PO TBPK: 10.0000 mg | ORAL_TABLET | Freq: Every day | ORAL | Status: DC

## 2015-11-20 MED ORDER — CLOTRIMAZOLE-BETAMETHASONE 1-0.05 % EX CREA: TOPICAL_CREAM | CUTANEOUS | Status: DC

## 2015-11-20 MED ORDER — HYDROMORPHONE HCL 1 MG/ML IJ SOLN
0.5000 mg | Freq: Once | INTRAMUSCULAR | Status: AC
  Administered 2015-11-20: 0.5 mg via INTRAVENOUS
  Filled 2015-11-20: qty 1

## 2015-11-20 NOTE — Discharge Instructions (Signed)
Chronic Obstructive Pulmonary Disease Chronic obstructive pulmonary disease (COPD) is a common lung condition in which airflow from the lungs is limited. COPD is a general term that can be used to describe many different lung problems that limit airflow, including both chronic bronchitis and emphysema. If you have COPD, your lung function will probably never return to normal, but there are measures you can take to improve lung function and make yourself feel better. CAUSES   Smoking (common).  Exposure to secondhand smoke.  Genetic problems.  Chronic inflammatory lung diseases or recurrent infections. SYMPTOMS  Shortness of breath, especially with physical activity.  Deep, persistent (chronic) cough with a large amount of thick mucus.  Wheezing.  Rapid breaths (tachypnea).  Gray or bluish discoloration (cyanosis) of the skin, especially in your fingers, toes, or lips.  Fatigue.  Weight loss.  Frequent infections or episodes when breathing symptoms become much worse (exacerbations).  Chest tightness. DIAGNOSIS Your health care provider will take a medical history and perform a physical examination to diagnose COPD. Additional tests for COPD may include:  Lung (pulmonary) function tests.  Chest X-ray.  CT scan.  Blood tests. TREATMENT  Treatment for COPD may include:  Inhaler and nebulizer medicines. These help manage the symptoms of COPD and make your breathing more comfortable.  Supplemental oxygen. Supplemental oxygen is only helpful if you have a low oxygen level in your blood.  Exercise and physical activity. These are beneficial for nearly all people with COPD.  Lung surgery or transplant.  Nutrition therapy to gain weight, if you are underweight.  Pulmonary rehabilitation. This may involve working with a team of health care providers and specialists, such as respiratory, occupational, and physical therapists. HOME CARE INSTRUCTIONS  Take all medicines  (inhaled or pills) as directed by your health care provider.  Avoid over-the-counter medicines or cough syrups that dry up your airway (such as antihistamines) and slow down the elimination of secretions unless instructed otherwise by your health care provider.  If you are a smoker, the most important thing that you can do is stop smoking. Continuing to smoke will cause further lung damage and breathing trouble. Ask your health care provider for help with quitting smoking. He or she can direct you to community resources or hospitals that provide support.  Avoid exposure to irritants such as smoke, chemicals, and fumes that aggravate your breathing.  Use oxygen therapy and pulmonary rehabilitation if directed by your health care provider. If you require home oxygen therapy, ask your health care provider whether you should purchase a pulse oximeter to measure your oxygen level at home.  Avoid contact with individuals who have a contagious illness.  Avoid extreme temperature and humidity changes.  Eat healthy foods. Eating smaller, more frequent meals and resting before meals may help you maintain your strength.  Stay active, but balance activity with periods of rest. Exercise and physical activity will help you maintain your ability to do things you want to do.  Preventing infection and hospitalization is very important when you have COPD. Make sure to receive all the vaccines your health care provider recommends, especially the pneumococcal and influenza vaccines. Ask your health care provider whether you need a pneumonia vaccine.  Learn and use relaxation techniques to manage stress.  Learn and use controlled breathing techniques as directed by your health care provider. Controlled breathing techniques include:  Pursed lip breathing. Start by breathing in (inhaling) through your nose for 1 second. Then, purse your lips as if you were  going to whistle and breathe out (exhale) through the  pursed lips for 2 seconds.  Diaphragmatic breathing. Start by putting one hand on your abdomen just above your waist. Inhale slowly through your nose. The hand on your abdomen should move out. Then purse your lips and exhale slowly. You should be able to feel the hand on your abdomen moving in as you exhale.  Learn and use controlled coughing to clear mucus from your lungs. Controlled coughing is a series of short, progressive coughs. The steps of controlled coughing are: 1. Lean your head slightly forward. 2. Breathe in deeply using diaphragmatic breathing. 3. Try to hold your breath for 3 seconds. 4. Keep your mouth slightly open while coughing twice. 5. Spit any mucus out into a tissue. 6. Rest and repeat the steps once or twice as needed. SEEK MEDICAL CARE IF:  You are coughing up more mucus than usual.  There is a change in the color or thickness of your mucus.  Your breathing is more labored than usual.  Your breathing is faster than usual. SEEK IMMEDIATE MEDICAL CARE IF:  You have shortness of breath while you are resting.  You have shortness of breath that prevents you from:  Being able to talk.  Performing your usual physical activities.  You have chest pain lasting longer than 5 minutes.  Your skin color is more cyanotic than usual.  You measure low oxygen saturations for longer than 5 minutes with a pulse oximeter. MAKE SURE YOU:  Understand these instructions.  Will watch your condition.  Will get help right away if you are not doing well or get worse.   This information is not intended to replace advice given to you by your health care provider. Make sure you discuss any questions you have with your health care provider.   Document Released: 06/28/2005 Document Revised: 10/09/2014 Document Reviewed: 05/15/2013 Elsevier Interactive Patient Education 2016 Elsevier Inc. Cutaneous Candidiasis Cutaneous candidiasis is a condition in which there is an  overgrowth of yeast (candida) on the skin. Yeast normally live on the skin, but in small enough numbers not to cause any symptoms. In certain cases, increased growth of the yeast may cause an actual yeast infection. This kind of infection usually occurs in areas of the skin that are constantly warm and moist, such as the armpits or the groin. Yeast is the most common cause of diaper rash in babies and in people who cannot control their bowel movements (incontinence). CAUSES  The fungus that most often causes cutaneous candidiasis is Candida albicans. Conditions that can increase the risk of getting a yeast infection of the skin include:  Obesity.  Pregnancy.  Diabetes.  Taking antibiotic medicine.  Taking birth control pills.  Taking steroid medicines.  Thyroid disease.  An iron or zinc deficiency.  Problems with the immune system. SYMPTOMS   Red, swollen area of the skin.  Bumps on the skin.  Itchiness. DIAGNOSIS  The diagnosis of cutaneous candidiasis is usually based on its appearance. Light scrapings of the skin may also be taken and viewed under a microscope to identify the presence of yeast. TREATMENT  Antifungal creams may be applied to the infected skin. In severe cases, oral medicines may be needed.  HOME CARE INSTRUCTIONS   Keep your skin clean and dry.  Maintain a healthy weight.  If you have diabetes, keep your blood sugar under control. SEEK IMMEDIATE MEDICAL CARE IF:  Your rash continues to spread despite treatment.  You have a  fever, chills, or abdominal pain.   This information is not intended to replace advice given to you by your health care provider. Make sure you discuss any questions you have with your health care provider.   Document Released: 06/06/2011 Document Revised: 12/11/2011 Document Reviewed: 03/22/2015 Elsevier Interactive Patient Education [employer].

## 2015-11-20 NOTE — ED Notes (Signed)
Patient transported to X-ray at this time 

## 2015-11-20 NOTE — ED Provider Notes (Signed)
[facility] Emergency Department Provider Note     Time seen: ----------------------------------------- 1:58 PM on 11/20/2015 -----------------------------------------    I have reviewed the triage vital signs and the nursing notes.   HISTORY  Chief Complaint Shortness of Breath    HPI Kirsten Castro is a 55 y.o. female who presents ER for shortness of breath last 3 days. Patient states she's been feeling wheezy today, EMS had given her Solu-Medrol and DuoNeb prior to arrival. Patient's oxygen was around 92% on room air before the nebs, 98% afterwards. She presents speaking in full sentences with no labored breathing. Patient is concerned because she's had multiple surgeries and overwhelming infections in the past. She denies fevers or chills, has had right flank pain.   Past Medical History  Diagnosis Date  . Stroke ([place])   . Coronary artery disease   . COPD (chronic obstructive pulmonary disease) ([place])   . Myocardial infarction ([place])   . Anginal pain ([place])   . Hypertension     Patient Active Problem List   Diagnosis Date Noted  . Carotid stenosis 02/10/2015    Past Surgical History  Procedure Laterality Date  . Coronary artery bypass graft    . Peripheral vascular catheterization N/A 02/10/2015    Procedure: Carotid PTA/Stent Intervention;  Surgeon: Katha Cabal, MD;  Location: [facility];  Service: Cardiovascular;  Laterality: N/A;    Allergies Review of patient's allergies indicates no known allergies.  Social History Social History  Substance Use Topics  . Smoking status: Former Research scientist (life sciences)  . Smokeless tobacco: None  . Alcohol Use: No    Review of Systems Constitutional: Negative for fever. Eyes: Negative for visual changes. ENT: Negative for sore throat. Cardiovascular: Negative for chest pain. Respiratory: Positive shortness of breath and cough Gastrointestinal: Negative for abdominal pain, vomiting and  diarrhea. Genitourinary: Negative for dysuria. Musculoskeletal: Negative for back pain. Skin: Negative for rash. Neurological: Negative for headaches, focal weakness or numbness.  10-point ROS otherwise negative.  ____________________________________________   PHYSICAL EXAM:  VITAL SIGNS: ED Triage Vitals  Enc Vitals Group     BP 11/20/15 1318 145/81 mmHg     Pulse Rate 11/20/15 1318 85     Resp 11/20/15 1318 16     Temp 11/20/15 1318 98.2 F (36.8 C)     Temp Source 11/20/15 1318 Oral     SpO2 11/20/15 1318 98 %     Weight 11/20/15 1318 160 lb (72.576 kg)     Height 11/20/15 1318 5\' 1"  (1.549 m)     Head Cir --      Peak Flow --      Pain Score 11/20/15 1319 10     Pain Loc --      Pain Edu? --      Excl. in [place]? --     Constitutional: Alert and oriented. Well appearing and in no distress. Eyes: Conjunctivae are normal. PERRL. Normal extraocular movements. ENT   Head: Normocephalic and atraumatic.   Nose: No congestion/rhinnorhea.   Mouth/Throat: Mucous membranes are moist.   Neck: No stridor. Cardiovascular: Normal rate, regular rhythm. Normal and symmetric distal pulses are present in all extremities. No murmurs, rubs, or gallops. Respiratory: Normal respiratory effort without tachypnea nor retractions. Breath sounds are clear and equal bilaterally. No wheezes/rales/rhonchi. Gastrointestinal: Soft and nontender. No distention. No abdominal bruits.  Musculoskeletal: Nontender with normal range of motion in all extremities. No joint effusions.  No lower extremity tenderness nor edema. Neurologic:  Normal speech and language. No gross focal neurologic deficits are appreciated.  Skin:  Erythema with small rash over both breasts Psychiatric: Mood and affect are normal. Speech and behavior are normal. Patient exhibits appropriate insight and judgment. ____________________________________________  EKG: Interpreted by me. Normal sinus rhythm with rate of 81 bpm,  normal PR interval, normal QRS, normal QT interval. Normal EKG.  ____________________________________________  ED COURSE:  Pertinent labs & imaging results that were available during my care of the patient were reviewed by me and considered in my medical decision making (see chart for details). Patient is in no acute distress, does not appear to be very short of breath on exam. I will check dyspnea labs and x-ray. ____________________________________________    LABS (pertinent positives/negatives)  Labs Reviewed  CBC - Abnormal; Notable for the following:    RDW 18.7 (*)    All other components within normal limits  BASIC METABOLIC PANEL - Abnormal; Notable for the following:    Glucose, Bld 124 (*)    BUN 29 (*)    Anion gap 4 (*)    All other components within normal limits  BRAIN NATRIURETIC PEPTIDE  TROPONIN I    RADIOLOGY Images were viewed by me  IMPRESSION: No acute cardiopulmonary disease.  ____________________________________________  FINAL ASSESSMENT AND PLAN  Dyspnea, yeast infection of the skin  Plan: Patient with labs and imaging as dictated above. Patient likely with some COPD flareup today. She'll be discharged with steroids and topical antifungal treatment for her rash. Labs and x-ray are unremarkable.   Earleen Newport, MD   Earleen Newport, MD 11/20/15 [phone]

## 2015-11-20 NOTE — ED Notes (Addendum)
Pt to ED via EMS from home with SOB x 3 days. Pt states began feeling wheezing today. Per EMS, pt wheezing throughout, given 125 mg solumedrol and 1 duo neb. Sating 92% RA. Upon arrival, pt sating 98% RA, talking full and complete sentences, non labored breathing, color appropriate, NAD noted.

## 2015-11-20 NOTE — ED Notes (Signed)
Lab called regarding add on BNP, will add on at this time  

## 2015-11-20 NOTE — ED Notes (Signed)
Lab called, purple top and green top hemolized at this time, must send more down.

## 2015-12-06 ENCOUNTER — Other Ambulatory Visit: Payer: Self-pay | Admitting: Infectious Diseases

## 2015-12-06 DIAGNOSIS — K859 Acute pancreatitis without necrosis or infection, unspecified: Secondary | ICD-10-CM | POA: Insufficient documentation

## 2015-12-07 ENCOUNTER — Ambulatory Visit
Admission: RE | Admit: 2015-12-07 | Discharge: 2015-12-07 | Disposition: A | Discharge status: Home / Self Care | Payer: BLUE CROSS/BLUE SHIELD | Source: Ambulatory Visit | Attending: Infectious Diseases | Admitting: Infectious Diseases

## 2015-12-07 DIAGNOSIS — K851 Biliary acute pancreatitis without necrosis or infection: Secondary | ICD-10-CM | POA: Insufficient documentation

## 2015-12-07 DIAGNOSIS — I700 Atherosclerosis of aorta: Secondary | ICD-10-CM | POA: Insufficient documentation

## 2015-12-07 DIAGNOSIS — K859 Acute pancreatitis without necrosis or infection, unspecified: Secondary | ICD-10-CM

## 2015-12-07 HISTORY — DX: Malignant (primary) neoplasm, unspecified: C80.1

## 2015-12-07 MED ORDER — IOHEXOL 300 MG/ML  SOLN
150.0000 mL | Freq: Once | INTRAMUSCULAR | Status: AC | PRN
  Administered 2015-12-07: 100 mL via INTRAVENOUS

## 2015-12-15 ENCOUNTER — Emergency Department: Payer: BLUE CROSS/BLUE SHIELD

## 2015-12-15 ENCOUNTER — Encounter: Payer: Self-pay | Admitting: Emergency Medicine

## 2015-12-15 ENCOUNTER — Inpatient Hospital Stay
Admission: EM | Admit: 2015-12-15 | Discharge: 2015-12-18 | DRG: 357 | Disposition: A | Discharge status: Home / Self Care | Payer: BLUE CROSS/BLUE SHIELD | Attending: Internal Medicine | Admitting: Internal Medicine

## 2015-12-15 DIAGNOSIS — G8929 Other chronic pain: Secondary | ICD-10-CM | POA: Diagnosis present

## 2015-12-15 DIAGNOSIS — Z7982 Long term (current) use of aspirin: Secondary | ICD-10-CM | POA: Diagnosis not present

## 2015-12-15 DIAGNOSIS — Z803 Family history of malignant neoplasm of breast: Secondary | ICD-10-CM

## 2015-12-15 DIAGNOSIS — K922 Gastrointestinal hemorrhage, unspecified: Secondary | ICD-10-CM

## 2015-12-15 DIAGNOSIS — Z955 Presence of coronary angioplasty implant and graft: Secondary | ICD-10-CM | POA: Diagnosis not present

## 2015-12-15 DIAGNOSIS — Z888 Allergy status to other drugs, medicaments and biological substances status: Secondary | ICD-10-CM

## 2015-12-15 DIAGNOSIS — K921 Melena: Secondary | ICD-10-CM | POA: Diagnosis present

## 2015-12-15 DIAGNOSIS — IMO0002 Reserved for concepts with insufficient information to code with codable children: Secondary | ICD-10-CM

## 2015-12-15 DIAGNOSIS — K2961 Other gastritis with bleeding: Secondary | ICD-10-CM

## 2015-12-15 DIAGNOSIS — I2510 Atherosclerotic heart disease of native coronary artery without angina pectoris: Secondary | ICD-10-CM | POA: Diagnosis present

## 2015-12-15 DIAGNOSIS — J449 Chronic obstructive pulmonary disease, unspecified: Secondary | ICD-10-CM | POA: Diagnosis present

## 2015-12-15 DIAGNOSIS — I252 Old myocardial infarction: Secondary | ICD-10-CM | POA: Diagnosis not present

## 2015-12-15 DIAGNOSIS — Z79899 Other long term (current) drug therapy: Secondary | ICD-10-CM

## 2015-12-15 DIAGNOSIS — Z87891 Personal history of nicotine dependence: Secondary | ICD-10-CM

## 2015-12-15 DIAGNOSIS — Z8673 Personal history of transient ischemic attack (TIA), and cerebral infarction without residual deficits: Secondary | ICD-10-CM

## 2015-12-15 DIAGNOSIS — K529 Noninfective gastroenteritis and colitis, unspecified: Secondary | ICD-10-CM

## 2015-12-15 DIAGNOSIS — I119 Hypertensive heart disease without heart failure: Secondary | ICD-10-CM | POA: Diagnosis present

## 2015-12-15 DIAGNOSIS — Z951 Presence of aortocoronary bypass graft: Secondary | ICD-10-CM | POA: Diagnosis not present

## 2015-12-15 DIAGNOSIS — K559 Vascular disorder of intestine, unspecified: Secondary | ICD-10-CM | POA: Diagnosis present

## 2015-12-15 DIAGNOSIS — Z85820 Personal history of malignant melanoma of skin: Secondary | ICD-10-CM | POA: Diagnosis not present

## 2015-12-15 DIAGNOSIS — R109 Unspecified abdominal pain: Secondary | ICD-10-CM

## 2015-12-15 DIAGNOSIS — K644 Residual hemorrhoidal skin tags: Secondary | ICD-10-CM | POA: Diagnosis present

## 2015-12-15 DIAGNOSIS — M4856XA Collapsed vertebra, not elsewhere classified, lumbar region, initial encounter for fracture: Secondary | ICD-10-CM | POA: Diagnosis present

## 2015-12-15 DIAGNOSIS — F419 Anxiety disorder, unspecified: Secondary | ICD-10-CM | POA: Diagnosis present

## 2015-12-15 DIAGNOSIS — E785 Hyperlipidemia, unspecified: Secondary | ICD-10-CM | POA: Diagnosis present

## 2015-12-15 DIAGNOSIS — R1030 Lower abdominal pain, unspecified: Secondary | ICD-10-CM

## 2015-12-15 HISTORY — DX: Noninfective gastroenteritis and colitis, unspecified: K52.9

## 2015-12-15 HISTORY — DX: Gastrointestinal hemorrhage, unspecified: K92.2

## 2015-12-15 LAB — CBC WITH DIFFERENTIAL/PLATELET
Basophils Absolute: 0 10*3/uL (ref 0–0.1)
Basophils Relative: 0 %
Eosinophils Absolute: 0.2 10*3/uL (ref 0–0.7)
Eosinophils Relative: 2 %
HCT: 44.5 % (ref 35.0–47.0)
HEMOGLOBIN: 14.7 g/dL (ref 12.0–16.0)
LYMPHS ABS: 1.1 10*3/uL (ref 1.0–3.6)
LYMPHS PCT: 14 %
MCH: 32.8 pg (ref 26.0–34.0)
MCHC: 33 g/dL (ref 32.0–36.0)
MCV: 99.4 fL (ref 80.0–100.0)
MONOS PCT: 11 %
Monocytes Absolute: 0.9 10*3/uL (ref 0.2–0.9)
NEUTROS PCT: 73 %
Neutro Abs: 5.7 10*3/uL (ref 1.4–6.5)
Platelets: 231 10*3/uL (ref 150–440)
RBC: 4.48 MIL/uL (ref 3.80–5.20)
RDW: 18.5 % — ABNORMAL HIGH (ref 11.5–14.5)
WBC: 7.9 10*3/uL (ref 3.6–11.0)

## 2015-12-15 LAB — COMPREHENSIVE METABOLIC PANEL
ALK PHOS: 80 U/L (ref 38–126)
ALT: 17 U/L (ref 14–54)
AST: 18 U/L (ref 15–41)
Albumin: 3.9 g/dL (ref 3.5–5.0)
Anion gap: 10 (ref 5–15)
BILIRUBIN TOTAL: 1.1 mg/dL (ref 0.3–1.2)
BUN: 19 mg/dL (ref 6–20)
CALCIUM: 9.1 mg/dL (ref 8.9–10.3)
CO2: 26 mmol/L (ref 22–32)
CREATININE: 0.68 mg/dL (ref 0.44–1.00)
Chloride: 96 mmol/L — ABNORMAL LOW (ref 101–111)
Glucose, Bld: 108 mg/dL — ABNORMAL HIGH (ref 65–99)
Potassium: 4.3 mmol/L (ref 3.5–5.1)
Sodium: 132 mmol/L — ABNORMAL LOW (ref 135–145)
TOTAL PROTEIN: 6.9 g/dL (ref 6.5–8.1)

## 2015-12-15 LAB — URINALYSIS COMPLETE WITH MICROSCOPIC (ARMC ONLY)
Bacteria, UA: NONE SEEN
Bilirubin Urine: NEGATIVE
GLUCOSE, UA: NEGATIVE mg/dL
HGB URINE DIPSTICK: NEGATIVE
KETONES UR: NEGATIVE mg/dL
Leukocytes, UA: NEGATIVE
Nitrite: NEGATIVE
PH: 7 (ref 5.0–8.0)
PROTEIN: NEGATIVE mg/dL
Specific Gravity, Urine: 1.009 (ref 1.005–1.030)

## 2015-12-15 LAB — LACTIC ACID, PLASMA: Lactic Acid, Venous: 1.1 mmol/L (ref 0.5–2.0)

## 2015-12-15 LAB — LIPASE, BLOOD: LIPASE: 32 U/L (ref 11–51)

## 2015-12-15 MED ORDER — TRAMADOL HCL 50 MG PO TABS: 50.0000 mg | ORAL_TABLET | Freq: Four times a day (QID) | ORAL | Status: DC | PRN

## 2015-12-15 MED ORDER — ALPRAZOLAM 1 MG PO TABS
1.0000 mg | ORAL_TABLET | Freq: Three times a day (TID) | ORAL | Status: DC
  Administered 2015-12-16 – 2015-12-18 (×8): 1 mg via ORAL
  Filled 2015-12-15 (×8): qty 1

## 2015-12-15 MED ORDER — FLUTICASONE FUROATE-VILANTEROL 200-25 MCG/INH IN AEPB
1.0000 | INHALATION_SPRAY | Freq: Every day | RESPIRATORY_TRACT | Status: DC
  Administered 2015-12-16 – 2015-12-18 (×3): 1 via RESPIRATORY_TRACT
  Filled 2015-12-15: qty 28

## 2015-12-15 MED ORDER — ONDANSETRON HCL 4 MG/2ML IJ SOLN
4.0000 mg | Freq: Four times a day (QID) | INTRAMUSCULAR | Status: DC | PRN
  Administered 2015-12-16 – 2015-12-17 (×2): 4 mg via INTRAVENOUS
  Filled 2015-12-15 (×2): qty 2

## 2015-12-15 MED ORDER — LORATADINE 10 MG PO TABS
10.0000 mg | ORAL_TABLET | Freq: Every day | ORAL | Status: DC
  Administered 2015-12-16 – 2015-12-18 (×3): 10 mg via ORAL
  Filled 2015-12-15 (×3): qty 1

## 2015-12-15 MED ORDER — SODIUM CHLORIDE 0.9 % IV BOLUS (SEPSIS)
1000.0000 mL | Freq: Once | INTRAVENOUS | Status: AC
  Administered 2015-12-15: 1000 mL via INTRAVENOUS

## 2015-12-15 MED ORDER — MORPHINE SULFATE (PF) 4 MG/ML IV SOLN
INTRAVENOUS | Status: AC
  Administered 2015-12-15: 4 mg via INTRAVENOUS
  Filled 2015-12-15: qty 1

## 2015-12-15 MED ORDER — TIOTROPIUM BROMIDE MONOHYDRATE 2.5 MCG/ACT IN AERS: 2.0000 | INHALATION_SPRAY | Freq: Every day | RESPIRATORY_TRACT | Status: DC

## 2015-12-15 MED ORDER — MORPHINE SULFATE (PF) 4 MG/ML IV SOLN
4.0000 mg | INTRAVENOUS | Status: DC | PRN
  Administered 2015-12-15 – 2015-12-18 (×11): 4 mg via INTRAVENOUS
  Filled 2015-12-15 (×11): qty 1

## 2015-12-15 MED ORDER — ONDANSETRON HCL 4 MG/2ML IJ SOLN
4.0000 mg | Freq: Once | INTRAMUSCULAR | Status: AC
Start: 2015-12-15 — End: 2015-12-15
  Administered 2015-12-15: 4 mg via INTRAVENOUS

## 2015-12-15 MED ORDER — METRONIDAZOLE IN NACL 5-0.79 MG/ML-% IV SOLN
500.0000 mg | Freq: Three times a day (TID) | INTRAVENOUS | Status: DC
  Administered 2015-12-16 – 2015-12-18 (×7): 500 mg via INTRAVENOUS
  Filled 2015-12-15 (×10): qty 100

## 2015-12-15 MED ORDER — CIPROFLOXACIN IN D5W 400 MG/200ML IV SOLN
400.0000 mg | Freq: Two times a day (BID) | INTRAVENOUS | Status: DC
  Administered 2015-12-16 – 2015-12-17 (×5): 400 mg via INTRAVENOUS
  Filled 2015-12-15 (×7): qty 200

## 2015-12-15 MED ORDER — CARVEDILOL 12.5 MG PO TABS
12.5000 mg | ORAL_TABLET | Freq: Two times a day (BID) | ORAL | Status: DC
  Administered 2015-12-16 – 2015-12-18 (×5): 12.5 mg via ORAL
  Filled 2015-12-15 (×5): qty 1

## 2015-12-15 MED ORDER — TRAZODONE HCL 50 MG PO TABS
25.0000 mg | ORAL_TABLET | Freq: Every evening | ORAL | Status: DC | PRN
  Administered 2015-12-16 (×2): 50 mg via ORAL
  Filled 2015-12-15 (×2): qty 1

## 2015-12-15 MED ORDER — MAGNESIUM OXIDE 400 (241.3 MG) MG PO TABS
400.0000 mg | ORAL_TABLET | Freq: Two times a day (BID) | ORAL | Status: DC
  Administered 2015-12-16 (×2): 400 mg via ORAL
  Filled 2015-12-15 (×3): qty 1

## 2015-12-15 MED ORDER — ATORVASTATIN CALCIUM 20 MG PO TABS
80.0000 mg | ORAL_TABLET | Freq: Every day | ORAL | Status: DC
  Administered 2015-12-16 – 2015-12-18 (×3): 80 mg via ORAL
  Filled 2015-12-15 (×3): qty 4

## 2015-12-15 MED ORDER — METRONIDAZOLE IN NACL 5-0.79 MG/ML-% IV SOLN
500.0000 mg | Freq: Three times a day (TID) | INTRAVENOUS | Status: DC
  Filled 2015-12-15 (×2): qty 100

## 2015-12-15 MED ORDER — CIPROFLOXACIN IN D5W 400 MG/200ML IV SOLN
400.0000 mg | Freq: Two times a day (BID) | INTRAVENOUS | Status: DC
  Filled 2015-12-15: qty 200

## 2015-12-15 MED ORDER — TIOTROPIUM BROMIDE MONOHYDRATE 18 MCG IN CAPS
18.0000 ug | ORAL_CAPSULE | Freq: Every day | RESPIRATORY_TRACT | Status: DC
  Administered 2015-12-16 – 2015-12-18 (×3): 18 ug via RESPIRATORY_TRACT
  Filled 2015-12-15: qty 5

## 2015-12-15 MED ORDER — ONDANSETRON HCL 4 MG/2ML IJ SOLN
INTRAMUSCULAR | Status: AC
  Administered 2015-12-15: 4 mg via INTRAVENOUS
  Filled 2015-12-15: qty 2

## 2015-12-15 MED ORDER — MORPHINE SULFATE (PF) 4 MG/ML IV SOLN
4.0000 mg | Freq: Once | INTRAVENOUS | Status: AC
  Administered 2015-12-15: 4 mg via INTRAVENOUS

## 2015-12-15 NOTE — ED Notes (Signed)
Reports abd pain and diarrhea x 1 month, dark red blood in it x 2 wks.  Sent by gi for further eval.

## 2015-12-15 NOTE — H&P (Signed)
[facility] NAME: Kirsten Castro    MR#:  [id number]  DATE OF BIRTH:  May 19, 1961  DATE OF ADMISSION:  12/15/2015  PRIMARY CARE PHYSICIAN: Adrian Prows, MD   REQUESTING/REFERRING PHYSICIAN: [place]  CHIEF COMPLAINT:   Chief Complaint  Patient presents with  . Abdominal Pain    HISTORY OF PRESENT ILLNESS: Kirsten Castro  is a 55 y.o. female with a known history of stroke, coronary artery disease, COPD, angina, hypertension, melanoma of the skin, multiple vascular procedures and and stenting in the past, coronary artery bypass surgery- for last few weeks is having abdominal pain which started mainly in the epigastric area and now going towards the lower abdomen which also radiated to her back, and which is constant but it gets worse after eating something, she also felt feels nauseous but did not vomit any time. She has loose bowel movement every time she eats something and it contains fresh red blood. CT scan of her abdomen was also done to find her pathology but it did not show anything abnormal last week. By her primary care physician she was referred to GI clinic today where she was seen by the PA, and she advised her to go to emergency room right away but forgetting admission as she might have colitis.  PAST MEDICAL HISTORY:   Past Medical History  Diagnosis Date  . Stroke ([place])   . Coronary artery disease   . COPD (chronic obstructive pulmonary disease) ([place])   . Myocardial infarction ([place])   . Anginal pain ([place])   . Hypertension   . Cancer ([place])     melanoma skin cancer    PAST SURGICAL HISTORY: Past Surgical History  Procedure Laterality Date  . Coronary artery bypass graft    . Peripheral vascular catheterization N/A 02/10/2015    Procedure: Carotid PTA/Stent Intervention;  Surgeon: Katha Cabal, MD;  Location: [facility];  Service: Cardiovascular;  Laterality: N/A;  . Stent placement vascular  ([facility] hx)      SOCIAL HISTORY:  Social History  Substance Use Topics  . Smoking status: Former Research scientist (life sciences)  . Smokeless tobacco: Not on file  . Alcohol Use: No    FAMILY HISTORY:  Family History  Problem Relation Age of Onset  . Breast cancer Sister     DRUG ALLERGIES:  Allergies  Allergen Reactions  . Ferrous Sulfate Hives    REVIEW OF SYSTEMS:   CONSTITUTIONAL: No fever, fatigue or weakness.  EYES: No blurred or double vision.  EARS, NOSE, AND THROAT: No tinnitus or ear pain.  RESPIRATORY: No cough, shortness of breath, wheezing or hemoptysis.  CARDIOVASCULAR: No chest pain, orthopnea, edema.  GASTROINTESTINAL: Positive nausea, no vomiting, positive diarrhea or abdominal pain. Her blood in the stool.  GENITOURINARY: No dysuria, hematuria.  ENDOCRINE: No polyuria, nocturia,  HEMATOLOGY: No anemia, easy bruising or bleeding SKIN: No rash or lesion. MUSCULOSKELETAL: No joint pain or arthritis.   NEUROLOGIC: No tingling, numbness, weakness.  PSYCHIATRY: No anxiety or depression.   MEDICATIONS AT HOME:  Prior to Admission medications   Medication Sig Start Date End Date Taking? Authorizing Provider  acetaminophen (TYLENOL) 500 MG tablet Take 1,000 mg by mouth every 6 (six) hours as needed for mild pain or headache.   Yes Historical Provider, MD  ALPRAZolam Duanne Moron) 1 MG tablet Take 1 mg by mouth 3 (three) times daily.   Yes Historical Provider, MD  aspirin 81 MG EC tablet Take 81  mg by mouth daily.    Yes Historical Provider, MD  atorvastatin (LIPITOR) 80 MG tablet Take 80 mg by mouth daily.   Yes Historical Provider, MD  carvedilol (COREG) 12.5 MG tablet Take 12.5 mg by mouth 2 (two) times daily with a meal.   Yes Historical Provider, MD  cetirizine (ZYRTEC) 10 MG tablet Take 10 mg by mouth daily.   Yes Historical Provider, MD  clotrimazole-betamethasone (LOTRISONE) cream Apply 1 application topically 2 (two) times daily. Pt applies to breasts.   Yes Historical Provider, MD   esomeprazole (NEXIUM) 20 MG capsule Take 20 mg by mouth daily at 12 noon.   Yes Historical Provider, MD  Fluticasone-Salmeterol (ADVAIR) 250-50 MCG/DOSE AEPB Inhale 1 puff into the lungs 2 (two) times daily.   Yes Historical Provider, MD  furosemide (LASIX) 40 MG tablet Take 40 mg by mouth daily.    Yes Historical Provider, MD  lisinopril-hydrochlorothiazide (PRINZIDE,ZESTORETIC) 10-12.5 MG tablet Take 1 tablet by mouth daily.   Yes Historical Provider, MD  Magnesium 250 MG TABS Take 250 mg by mouth 2 (two) times daily.   Yes Historical Provider, MD  potassium chloride (K-DUR,KLOR-CON) 10 MEQ tablet Take 10 mEq by mouth 2 (two) times daily.   Yes Historical Provider, MD  Tiotropium Bromide Monohydrate (SPIRIVA RESPIMAT) 2.5 MCG/ACT AERS Inhale 2 puffs into the lungs daily. 02/12/15  Yes Katha Cabal, MD  traMADol (ULTRAM) 50 MG tablet Take 50 mg by mouth every 6 (six) hours as needed for moderate pain.   Yes Historical Provider, MD  traZODone (DESYREL) 50 MG tablet Take 25-50 mg by mouth at bedtime as needed for sleep.    Yes Historical Provider, MD  predniSONE (STERAPRED UNI-PAK 21 TAB) 10 MG (21) TBPK tablet Take 1 tablet (10 mg total) by mouth daily. Take steroid taper pak as directed Patient not taking: Reported on 12/15/2015 11/20/15   Earleen Newport, MD      PHYSICAL EXAMINATION:   VITAL SIGNS: Blood pressure 121/75, pulse 76, temperature 98.1 F (36.7 C), temperature source Oral, resp. rate 32, height 5\' 1"  (1.549 m), weight 72.122 kg (159 lb), SpO2 92 %.  GENERAL:  55 y.o.-year-old patient lying in the bed with no acute distress.  EYES: Pupils equal, round, reactive to light and accommodation. No scleral icterus. Extraocular muscles intact.  HEENT: Head atraumatic, normocephalic. Oropharynx and nasopharynx clear.  NECK:  Supple, no jugular venous distention. No thyroid enlargement, no tenderness.  LUNGS: Normal breath sounds bilaterally, no wheezing, rales,rhonchi or  crepitation. No use of accessory muscles of respiration.  CARDIOVASCULAR: S1, S2 normal. No murmurs, rubs, or gallops.  ABDOMEN: Soft, tender, nondistended. Bowel sounds present. No organomegaly or mass.  EXTREMITIES: No pedal edema, cyanosis, or clubbing.  NEUROLOGIC: Cranial nerves II through XII are intact. Muscle strength 5/5 in all extremities. Sensation intact. Gait not checked.  PSYCHIATRIC: The patient is alert and oriented x 3.  SKIN: No obvious rash, lesion, or ulcer.   LABORATORY PANEL:   CBC  Recent Labs Lab 12/15/15 1625  WBC 7.9  HGB 14.7  HCT 44.5  PLT 231  MCV 99.4  MCH 32.8  MCHC 33.0  RDW 18.5*  LYMPHSABS 1.1  MONOABS 0.9  EOSABS 0.2  BASOSABS 0.0   ------------------------------------------------------------------------------------------------------------------  Chemistries   Recent Labs Lab 12/15/15 1625  NA 132*  K 4.3  CL 96*  CO2 26  GLUCOSE 108*  BUN 19  CREATININE 0.68  CALCIUM 9.1  AST 18  ALT 17  ALKPHOS  80  BILITOT 1.1   ------------------------------------------------------------------------------------------------------------------ estimated creatinine clearance is 74.7 mL/min (by C-G formula based on Cr of 0.68). ------------------------------------------------------------------------------------------------------------------ No results for input(s): TSH, T4TOTAL, T3FREE, THYROIDAB in the last 72 hours.  Invalid input(s): FREET3   Coagulation profile No results for input(s): INR, PROTIME in the last 168 hours. ------------------------------------------------------------------------------------------------------------------- No results for input(s): DDIMER in the last 72 hours. -------------------------------------------------------------------------------------------------------------------  Cardiac Enzymes No results for input(s): CKMB, TROPONINI, MYOGLOBIN in the last 168 hours.  Invalid input(s):  CK ------------------------------------------------------------------------------------------------------------------ Invalid input(s): POCBNP  ---------------------------------------------------------------------------------------------------------------  Urinalysis    Component Value Date/Time   COLORURINE YELLOW* 12/15/2015 1625   COLORURINE Yellow 12/30/2013 2351   APPEARANCEUR CLEAR* 12/15/2015 1625   APPEARANCEUR Clear 12/30/2013 2351   LABSPEC 1.009 12/15/2015 1625   LABSPEC 1.005 12/30/2013 2351   PHURINE 7.0 12/15/2015 1625   PHURINE 6.0 12/30/2013 2351   GLUCOSEU NEGATIVE 12/15/2015 1625   GLUCOSEU Negative 12/30/2013 2351   HGBUR NEGATIVE 12/15/2015 1625   HGBUR 1+ 12/30/2013 2351   BILIRUBINUR NEGATIVE 12/15/2015 1625   BILIRUBINUR Negative 12/30/2013 2351   KETONESUR NEGATIVE 12/15/2015 1625   KETONESUR Negative 12/30/2013 2351   PROTEINUR NEGATIVE 12/15/2015 1625   PROTEINUR Negative 12/30/2013 2351   NITRITE NEGATIVE 12/15/2015 1625   NITRITE Negative 12/30/2013 2351   LEUKOCYTESUR NEGATIVE 12/15/2015 1625   LEUKOCYTESUR Trace 12/30/2013 2351     RADIOLOGY: Dg Chest 1 View  12/15/2015  CLINICAL DATA:  Chronic lower abdominal pain and diarrhea, with dark red blood in stool. Initial encounter. EXAM: CHEST 1 VIEW COMPARISON:  Chest radiograph performed 11/20/2015 FINDINGS: The lungs are well-aerated. Pulmonary vascularity is at the upper limits of focal. There is no evidence of focal opacification, pleural effusion or pneumothorax. The cardiomediastinal silhouette is mildly enlarged. The patient is status post median sternotomy. No acute osseous abnormalities are seen. IMPRESSION: Mild cardiomegaly.  Lungs remain grossly clear. Electronically Signed   By: Garald Balding M.D.   On: 12/15/2015 19:51   Dg Lumbar Spine Complete  12/15/2015  CLINICAL DATA:  Lower abdominal pain and diarrhea for 1 month. Twisting injury 2 nights ago with popping sound and now with low  back pain. EXAM: LUMBAR SPINE - COMPLETE 4+ VIEW COMPARISON:  CT scan from 12/07/2015. FINDINGS: Lateral view shows superior endplate compression deformity of the L1 vertebral body, new since the CT scan 8 days ago, suggesting acute L1 superior endplate fracture. Remaining lumbar type vertebral bodies are of normal height. Intervertebral disc spaces are preserved. Advanced atherosclerotic calcification noted in the abdominal aorta with multiple arterial stent device is evident. IMPRESSION: L1 superior endplate compression fracture results in less than 25% loss of height at this level and is new since 12/07/2015. Electronically Signed   By: Misty Stanley M.D.   On: 12/15/2015 19:52    EKG: Orders placed or performed during the hospital encounter of 11/20/15  . EKG 12-Lead  . EKG 12-Lead  . ED EKG  . ED EKG    IMPRESSION AND PLAN:  * Acute colitis   Give IV Cipro and Flagyl for now.   Keep on clear liquid diet.   IV morphine for pain and IV Zofran for nausea as needed.   GI consult.  * GI bleed   Slightly lower GI, secondary to colitis.   Keep on clear liquid diet, currently hemoglobin is stable.   GI consult for further management.  * Coronary artery disease   Currently I'll hold her aspirin as she is having GI bleed, continue other  medications.  * COPD   Continue inhalers.  * Hyperlipidemia   Continue stating.  * Chronic pain   Continue tramadol for now and she will be on IV morphine also.   All the records are reviewed and case discussed with ED provider. Management plans discussed with the patient, family and they are in agreement.  CODE STATUS: Code Status History    Date Active Date Inactive Code Status Order ID Comments User Context   02/10/2015 12:02 PM 02/12/2015  7:12 PM Full Code [id number]  Algernon Huxley, MD Inpatient       TOTAL TIME TAKING CARE OF THIS PATIENT: 50 minutes.  Discussed with her husband was present in the room at time of admission.  Vaughan Basta M.D on 12/15/2015   Between 7am to 6pm - Pager - [phone]  After 6pm go to [email or web address] - password [email or web address] [facility]  [facility]  Office  [phone]  CC: Primary care physician; Adrian Prows, MD   Note: This dictation was prepared with Dragon dictation along with smaller phrase technology. Any transcriptional errors that result from this process are unintentional.

## 2015-12-15 NOTE — ED Provider Notes (Signed)
[facility] Emergency Department Provider Note  ____________________________________________  Time seen: Approximately 7:17 PM  I have reviewed the triage vital signs and the nursing notes.   HISTORY  Chief Complaint Abdominal Pain    HPI Kirsten Castro is a 55 y.o. female Patient complains of 2 weeks of nausea vomiting and bloody diarrhea. She was seen a week ago CT scan then showed nothing. She went to gastroenterology today and the gastroenterologist saw her and thought that she was a little dehydrated and of course had bloody diarrhea so sent her back to the emergency room for evaluation. In the emergency room patient reports increasing belly pain. Patient has not had any vomiting or diarrhea since she came in. About a year ago patient had a stent put in her superior mesenteric artery. Patient also reports a day or so ago she fell and said he heard something pop in her back and now her back is also hurting a little bit. Patient reports pain is moderately severe and worse if she moves.   Past Medical History  Diagnosis Date  . Stroke ([place])   . Coronary artery disease   . COPD (chronic obstructive pulmonary disease) ([place])   . Myocardial infarction ([place])   . Anginal pain ([place])   . Hypertension   . Cancer [employer])     melanoma skin cancer    Patient Active Problem List   Diagnosis Date Noted  . Colitis 12/15/2015  . GI bleed 12/15/2015  . Carotid stenosis 02/10/2015    Past Surgical History  Procedure Laterality Date  . Coronary artery bypass graft    . Peripheral vascular catheterization N/A 02/10/2015    Procedure: Carotid PTA/Stent Intervention;  Surgeon: Katha Cabal, MD;  Location: [facility];  Service: Cardiovascular;  Laterality: N/A;  . Stent placement vascular ([facility] hx)      Current Outpatient Rx  Name  Route  Sig  Dispense  Refill  . acetaminophen (TYLENOL) 500 MG tablet   Oral   Take 1,000 mg by mouth every 6 (six)  hours as needed for mild pain or headache.         . ALPRAZolam (XANAX) 1 MG tablet   Oral   Take 1 mg by mouth 3 (three) times daily.         Marland Kitchen aspirin 81 MG EC tablet   Oral   Take 81 mg by mouth daily.          Marland Kitchen atorvastatin (LIPITOR) 80 MG tablet   Oral   Take 80 mg by mouth daily.         . carvedilol (COREG) 12.5 MG tablet   Oral   Take 12.5 mg by mouth 2 (two) times daily with a meal.         . cetirizine (ZYRTEC) 10 MG tablet   Oral   Take 10 mg by mouth daily.         . clotrimazole-betamethasone (LOTRISONE) cream   Topical   Apply 1 application topically 2 (two) times daily. Pt applies to breasts.         Marland Kitchen esomeprazole (NEXIUM) 20 MG capsule   Oral   Take 20 mg by mouth daily at 12 noon.         . Fluticasone-Salmeterol (ADVAIR) 250-50 MCG/DOSE AEPB   Inhalation   Inhale 1 puff into the lungs 2 (two) times daily.         . furosemide (LASIX) 40 MG tablet  Oral   Take 40 mg by mouth daily.          Marland Kitchen lisinopril-hydrochlorothiazide (PRINZIDE,ZESTORETIC) 10-12.5 MG tablet   Oral   Take 1 tablet by mouth daily.         . Magnesium 250 MG TABS   Oral   Take 250 mg by mouth 2 (two) times daily.         . potassium chloride (K-DUR,KLOR-CON) 10 MEQ tablet   Oral   Take 10 mEq by mouth 2 (two) times daily.         . Tiotropium Bromide Monohydrate (SPIRIVA RESPIMAT) 2.5 MCG/ACT AERS   Inhalation   Inhale 2 puffs into the lungs daily.   1 Inhaler   11   . traMADol (ULTRAM) 50 MG tablet   Oral   Take 50 mg by mouth every 6 (six) hours as needed for moderate pain.         . traZODone (DESYREL) 50 MG tablet   Oral   Take 25-50 mg by mouth at bedtime as needed for sleep.          . predniSONE (STERAPRED UNI-PAK 21 TAB) 10 MG (21) TBPK tablet   Oral   Take 1 tablet (10 mg total) by mouth daily. Take steroid taper pak as directed Patient not taking: Reported on 12/15/2015   21 tablet   0     Allergies Ferrous  sulfate  Family History  Problem Relation Age of Onset  . Breast cancer Sister     Social History Social History  Substance Use Topics  . Smoking status: Former Research scientist (life sciences)  . Smokeless tobacco: None  . Alcohol Use: No    Review of Systems onstitutional: No fever/chills Eyes: No visual changes. ENT: No sore throat. Cardiovascular: Denies chest pain. Respiratory: Denies shortness of breath. Gastrointestinal see history of present illness Genitourinary: Patient reports she has a leaky bladder and wants the Foley catheter. Musculoskeletal: Negative for back pain. Skin: Negative for rash. Neurological: Negative for headaches, focal weakness or numbness.  10-point ROS otherwise negative.  ____________________________________________   PHYSICAL EXAM:  VITAL SIGNS: ED Triage Vitals  Enc Vitals Group     BP 12/15/15 1613 119/77 mmHg     Pulse Rate 12/15/15 1613 90     Resp 12/15/15 1613 16     Temp 12/15/15 1613 98.1 F (36.7 C)     Temp Source 12/15/15 1613 Oral     SpO2 12/15/15 1613 96 %     Weight 12/15/15 1613 159 lb (72.122 kg)     Height 12/15/15 1613 5\' 1"  (1.549 m)     Head Cir --      Peak Flow --      Pain Score 12/15/15 1619 10     Pain Loc --      Pain Edu? --      Excl. in [place]? --     Constitutional: Alert and oriented. Patient looks well but moans and groans every time she moves including rolling over to get the rectal exam. Eyes: Conjunctivae are normal. PERRL. EOMI. Head: Atraumatic. Nose: No congestion/rhinnorhea. Mouth/Throat: Mucous membranes are moist.  Oropharynx non-erythematous. Neck: No stridor.  Cardiovascular: Normal rate, regular rhythm. Grossly normal heart sounds.  Good peripheral circulation. Respiratory: Normal respiratory effort.  No retractions. Lungs CTAB. Gastrointestinal: Soft but diffusely tender with decreased bowel sounds No distention. No abdominal bruits. No CVA tenderness. Rectal: There are some old hemorrhoidal tags present  no obvious new hemorrhoids. There is  no stool as far as I can reach in the rectum. When I Hemoccult the residual surgical lube there is no Hemoccult positivity. Musculoskeletal: No lower extremity tenderness nor edema.  No joint effusions. Neurologic:  Normal speech and language. No gross focal neurologic deficits are appreciated. No gait instability. Skin:  Skin is warm, dry and intact. No rash noted. Psychiatric: Mood and affect are normal. Speech and behavior are normal.  ____________________________________________   LABS (all labs ordered are listed, but only abnormal results are displayed)  Labs Reviewed  CBC WITH DIFFERENTIAL/PLATELET - Abnormal; Notable for the following:    RDW 18.5 (*)    All other components within normal limits  COMPREHENSIVE METABOLIC PANEL - Abnormal; Notable for the following:    Sodium 132 (*)    Chloride 96 (*)    Glucose, Bld 108 (*)    All other components within normal limits  URINALYSIS COMPLETEWITH MICROSCOPIC ([facility] ONLY) - Abnormal; Notable for the following:    Color, Urine YELLOW (*)    APPearance CLEAR (*)    Squamous Epithelial / LPF 0-5 (*)    All other components within normal limits  LIPASE, BLOOD  LACTIC ACID, PLASMA  LACTIC ACID, PLASMA   ____________________________________________  EKG   ____________________________________________  RADIOLOGY   ____________________________________________   PROCEDURES    ____________________________________________   INITIAL IMPRESSION / ASSESSMENT AND PLAN / ED COURSE  Pertinent labs & imaging results that were available during my care of the patient were reviewed by me and considered in my medical decision making (see chart for details).  Since the patient has been having nausea vomiting and bloody diarrhea for 2 weeks and was sent here for evaluation from the gastroenterologist office we will admit her for further  evaluation. ____________________________________________   FINAL CLINICAL IMPRESSION(S) / ED DIAGNOSES  Final diagnoses:  Abdominal pain, unspecified abdominal location  Hematochezia      Nena Polio, MD 12/15/15 2156

## 2015-12-15 NOTE — ED Notes (Signed)
MD at bedside. 

## 2015-12-16 ENCOUNTER — Inpatient Hospital Stay: Payer: BLUE CROSS/BLUE SHIELD

## 2015-12-16 LAB — CBC
HCT: 42.3 % (ref 35.0–47.0)
Hemoglobin: 14.1 g/dL (ref 12.0–16.0)
MCH: 33.4 pg (ref 26.0–34.0)
MCHC: 33.3 g/dL (ref 32.0–36.0)
MCV: 100.5 fL — ABNORMAL HIGH (ref 80.0–100.0)
PLATELETS: 205 10*3/uL (ref 150–440)
RBC: 4.21 MIL/uL (ref 3.80–5.20)
RDW: 19.3 % — AB (ref 11.5–14.5)
WBC: 6.1 10*3/uL (ref 3.6–11.0)

## 2015-12-16 LAB — BASIC METABOLIC PANEL
Anion gap: 7 (ref 5–15)
BUN: 14 mg/dL (ref 6–20)
CALCIUM: 8.9 mg/dL (ref 8.9–10.3)
CHLORIDE: 100 mmol/L — AB (ref 101–111)
CO2: 27 mmol/L (ref 22–32)
CREATININE: 0.61 mg/dL (ref 0.44–1.00)
Glucose, Bld: 73 mg/dL (ref 65–99)
Potassium: 4.3 mmol/L (ref 3.5–5.1)
SODIUM: 134 mmol/L — AB (ref 135–145)

## 2015-12-16 LAB — LACTIC ACID, PLASMA: LACTIC ACID, VENOUS: 1 mmol/L (ref 0.5–2.0)

## 2015-12-16 MED ORDER — PANTOPRAZOLE SODIUM 40 MG PO TBEC
40.0000 mg | DELAYED_RELEASE_TABLET | Freq: Every day | ORAL | Status: DC
  Administered 2015-12-16 – 2015-12-18 (×3): 40 mg via ORAL
  Filled 2015-12-16 (×3): qty 1

## 2015-12-16 NOTE — Consult Note (Signed)
Consultation  Referring Provider:   Dr Marthann Schiller   Admit date: 12/15/15 Consult date: 12/16/15         Reason for Consultation: Colitis/GIB             HPI:   Kirsten Castro is a 55 y.o. female history of stroke, coronary artery disease, COPD, angina, hypertension, melanoma of the skin, multiple vascular procedures and and stenting in the past, coronary artery bypass surgery/sternotomy 2015, as well as mesenteric stenosis with SMA stent placed 2015, who was admitted yesterday with severe lower abdominal pain/reports of bloody diarrhea. I saw the patient in the GI clinic yesterday for complaints of abdominal pain- over the last 2w was  Increasing to the  bilateral lower  Abdomen which radiated to her back- rated 10/10 and said she had diarrhea progressively turning bloody (dark red blood) over the last 2w. CBC was checked- hgb remarkable only in that it was not low, given her reports. Surprisingly as well, there was no bloody effluent on rectal exam. She did have large external hemorrhoids.  She was orthostatic at clinic and referred to ED for further care. Also reported upper abdominal pain over the last month with NV yesterday. States she vomits solid foodstuffs but not liquids.  Lipase and CMP were normal 2w ago. Had upper abdominal (but not lower abdominal or pelvic) and chest CT which was unremarkable for GI issues- did show a patent SMA stent.  Has been on qd Nexium. Denies NSAIDs. States today she is feeling better- has been receiving IVF and cipro/flaygyl. Having much less abdominal pain, no further NV. Also reports no further rectal bleeding and no diarrhea since admission. Cbcs here quite stable. Na and Cl improved today (low on admission). Hemodynamically stable, afebrile. No lower abdominal imaging has been done.  PREVIOUS ENDOSCOPIES:            Colonoscopy 5/14 with hyperplastic polyp and hemorrhoids. Says she has never had any EGD.   Past Medical History  Diagnosis Date  .  Stroke ([place])   . Coronary artery disease   . COPD (chronic obstructive pulmonary disease) ([place])   . Myocardial infarction ([place])   . Anginal pain ([place])   . Hypertension   . Cancer [facility])     melanoma skin cancer    Past Surgical History  Procedure Laterality Date  . Coronary artery bypass graft    . Peripheral vascular catheterization N/A 02/10/2015    Procedure: Carotid PTA/Stent Intervention;  Surgeon: Katha Cabal, MD;  Location: [place];  Service: Cardiovascular;  Laterality: N/A;  . Stent placement vascular ([facility] hx)      Family History  Problem Relation Age of Onset  . Breast cancer Sister      Social History  Substance Use Topics  . Smoking status: Former Research scientist (life sciences)  . Smokeless tobacco: None  . Alcohol Use: No    Prior to Admission medications   Medication Sig Start Date End Date Taking? Authorizing Provider  acetaminophen (TYLENOL) 500 MG tablet Take 1,000 mg by mouth every 6 (six) hours as needed for mild pain or headache.   Yes Historical Provider, MD  ALPRAZolam Duanne Moron) 1 MG tablet Take 1 mg by mouth 3 (three) times daily.   Yes Historical Provider, MD  aspirin 81 MG EC tablet Take 81 mg by mouth daily.    Yes Historical Provider, MD  atorvastatin (LIPITOR) 80 MG tablet Take 80 mg by mouth daily.   Yes Historical Provider, MD  carvedilol (  COREG) 12.5 MG tablet Take 12.5 mg by mouth 2 (two) times daily with a meal.   Yes Historical Provider, MD  cetirizine (ZYRTEC) 10 MG tablet Take 10 mg by mouth daily.   Yes Historical Provider, MD  clotrimazole-betamethasone (LOTRISONE) cream Apply 1 application topically 2 (two) times daily. Pt applies to breasts.   Yes Historical Provider, MD  esomeprazole (NEXIUM) 20 MG capsule Take 20 mg by mouth daily at 12 noon.   Yes Historical Provider, MD  Fluticasone-Salmeterol (ADVAIR) 250-50 MCG/DOSE AEPB Inhale 1 puff into the lungs 2 (two) times daily.   Yes Historical Provider, MD  furosemide (LASIX) 40 MG tablet Take  40 mg by mouth daily.    Yes Historical Provider, MD  lisinopril-hydrochlorothiazide (PRINZIDE,ZESTORETIC) 10-12.5 MG tablet Take 1 tablet by mouth daily.   Yes Historical Provider, MD  Magnesium 250 MG TABS Take 250 mg by mouth 2 (two) times daily.   Yes Historical Provider, MD  potassium chloride (K-DUR,KLOR-CON) 10 MEQ tablet Take 10 mEq by mouth 2 (two) times daily.   Yes Historical Provider, MD  Tiotropium Bromide Monohydrate (SPIRIVA RESPIMAT) 2.5 MCG/ACT AERS Inhale 2 puffs into the lungs daily. 02/12/15  Yes Katha Cabal, MD  traMADol (ULTRAM) 50 MG tablet Take 50 mg by mouth every 6 (six) hours as needed for moderate pain.   Yes Historical Provider, MD  traZODone (DESYREL) 50 MG tablet Take 25-50 mg by mouth at bedtime as needed for sleep.    Yes Historical Provider, MD  predniSONE (STERAPRED UNI-PAK 21 TAB) 10 MG (21) TBPK tablet Take 1 tablet (10 mg total) by mouth daily. Take steroid taper pak as directed Patient not taking: Reported on 12/15/2015 11/20/15   Earleen Newport, MD    Current Facility-Administered Medications  Medication Dose Route Frequency Provider Last Rate Last Dose  . ALPRAZolam Duanne Moron) tablet 1 mg  1 mg Oral TID Vaughan Basta, MD   1 mg at 12/16/15 0911  . atorvastatin (LIPITOR) tablet 80 mg  80 mg Oral Daily Vaughan Basta, MD   80 mg at 12/16/15 0911  . carvedilol (COREG) tablet 12.5 mg  12.5 mg Oral BID WC Vaughan Basta, MD   12.5 mg at 12/16/15 0917  . ciprofloxacin (CIPRO) IVPB 400 mg  400 mg Intravenous Q12H Lenis Noon, RPH   400 mg at 12/16/15 1152  . fluticasone furoate-vilanterol (BREO ELLIPTA) 200-25 MCG/INH 1 puff  1 puff Inhalation Daily Vaughan Basta, MD   1 puff at 12/16/15 1146  . loratadine (CLARITIN) tablet 10 mg  10 mg Oral Daily Vaughan Basta, MD   10 mg at 12/16/15 0911  . magnesium oxide (MAG-OX) tablet 400 mg  400 mg Oral BID Vaughan Basta, MD   400 mg at 12/16/15 0911  . metroNIDAZOLE  (FLAGYL) IVPB 500 mg  500 mg Intravenous [address] [place], RPH   500 mg at 12/16/15 [id number]  . morphine 4 MG/ML injection 4 mg  4 mg Intravenous Q4H PRN Vaughan Basta, MD   4 mg at 12/16/15 0921  . ondansetron (ZOFRAN) injection 4 mg  4 mg Intravenous Q6H PRN Vaughan Basta, MD      . tiotropium (SPIRIVA) inhalation capsule 18 mcg  18 mcg Inhalation Daily Lenis Noon, RPH   18 mcg at 12/16/15 1147  . traMADol (ULTRAM) tablet 50 mg  50 mg Oral Q6H PRN Vaughan Basta, MD      . traZODone (DESYREL) tablet 25-50 mg  25-50 mg Oral QHS PRN Vaughan Basta,  MD   50 mg at 12/16/15 0051    Allergies as of 12/15/2015 - Review Complete 12/15/2015  Allergen Reaction Noted  . Ferrous sulfate Hives 12/15/2015     Review of Systems:    All systems reviewed and negative except where noted in HPI, with the exception of recent fall, dysuria, anxiety, chronic sob. Seen for copd exacerbation 2/17 in [facility] ed. Did have Lspine xray for complaints of back pain with a new L1compression fracture yesterday .    Physical Exam:  Vital signs in last 24 hours: Temp:  [97.7 F (36.5 C)-98.1 F (36.7 C)] 97.8 F (36.6 C) (03/16 1240) Pulse Rate:  [73-94] 85 (03/16 1240) Resp:  [16-33] 17 (03/16 1240) BP: (98-135)/(58-79) 106/61 mmHg (03/16 1240) SpO2:  [92 %-100 %] 96 % (03/16 1240) Weight:  [72.122 kg (159 lb)] 72.122 kg (159 lb) (03/15 1613) Last BM Date: 12/15/15 General:   Pleasant woma in NAD. Looks much better than yesterday. Head:  Normocephalic and atraumatic. Eyes:   No icterus.   Conjunctiva pink. Ears:  Normal auditory acuity. Mouth: Mucosa pink moist, no lesions. Neck:  Supple; no masses felt Lungs: Respirations even and unlabored. Lungs clear to auscultation bilaterally.   No wheezes, crackles, or rhonchi.  Heart:  S1S2, RRR, no MRG. No edema. Abdomen:   Flat, soft, nondistended,minimal lower abdominal tenderness. Normal bowel sounds. No appreciable masses or  hepatomegaly. No rebound signs or other peritoneal signs..  Msk:  MAEW x4, No clubbing or cyanosis. Strength 5/5. Symmetrical without gross deformities. Neurologic:  Alert and  oriented x4;  Cranial nerves II-XII intact.  Skin:  Warm, dry, pink without significant lesions or rashes. Psych:  Alert and cooperative. Normal affect.  LAB RESULTS:  Recent Labs  12/15/15 1625 12/16/15 0529  WBC 7.9 6.1  HGB 14.7 14.1  HCT 44.5 42.3  PLT 231 205   BMET  Recent Labs  12/15/15 1625 12/16/15 0529  NA 132* 134*  K 4.3 4.3  CL 96* 100*  CO2 26 27  GLUCOSE 108* 73  BUN 19 14  CREATININE 0.68 0.61  CALCIUM 9.1 8.9   LFT  Recent Labs  12/15/15 1625  PROT 6.9  ALBUMIN 3.9  AST 18  ALT 17  ALKPHOS 80  BILITOT 1.1   PT/INR No results for input(s): LABPROT, INR in the last 72 hours.  STUDIES: Dg Chest 1 View  12/15/2015  CLINICAL DATA:  Chronic lower abdominal pain and diarrhea, with dark red blood in stool. Initial encounter. EXAM: CHEST 1 VIEW COMPARISON:  Chest radiograph performed 11/20/2015 FINDINGS: The lungs are well-aerated. Pulmonary vascularity is at the upper limits of focal. There is no evidence of focal opacification, pleural effusion or pneumothorax. The cardiomediastinal silhouette is mildly enlarged. The patient is status post median sternotomy. No acute osseous abnormalities are seen. IMPRESSION: Mild cardiomegaly.  Lungs remain grossly clear. Electronically Signed   By: Garald Balding M.D.   On: 12/15/2015 19:51   Dg Lumbar Spine Complete  12/15/2015  CLINICAL DATA:  Lower abdominal pain and diarrhea for 1 month. Twisting injury 2 nights ago with popping sound and now with low back pain. EXAM: LUMBAR SPINE - COMPLETE 4+ VIEW COMPARISON:  CT scan from 12/07/2015. FINDINGS: Lateral view shows superior endplate compression deformity of the L1 vertebral body, new since the CT scan 8 days ago, suggesting acute L1 superior endplate fracture. Remaining lumbar type vertebral  bodies are of normal height. Intervertebral disc spaces are preserved. Advanced atherosclerotic calcification noted in  the abdominal aorta with multiple arterial stent device is evident. IMPRESSION: L1 superior endplate compression fracture results in less than 25% loss of height at this level and is new since 12/07/2015. Electronically Signed   By: Misty Stanley M.D.   On: 12/15/2015 19:52       Impression / Plan:   1. Lower abdominal pain/history of bloody diarrhea/heme positive stool. Feeling better today with abx/fluids/pain meds. States no further bleeding/diarrhea today. There has been no imaging of lower abdomen so will order this. Of note, she does have large hemorrhoids, so may have had anal outlet bleeding triggered by her diarrheal illness. Will discuss further stool studies with Dr Gustavo Lah as patient on abx and now no diarrhea 2. Upper abdominal pain/NV. NV improved. Feeling better. I am going to restart her PPI. Patient without history of egd- we discussed the possibility of this 3. New L1 compression fracture- consider ortho consult.  Thank you very much for this consult. These services were provided by Stephens November, NP-C, in collaboration with Lollie Sails, MD, with whom I have discussed this patient in full.   Stephens November, NP-C  Addendum: in further discussion and review of imaging with Dr Gustavo Lah, patient with significant atherosclerosis in the aorta and vessels. Given her history of mesenteric ischmemia, will hold ct for now and ask vascular to come assess. Consider ct/egd/flexible sigmoidoscopy as indicated after vascular consult.

## 2015-12-16 NOTE — Consult Note (Signed)
Please see full GI consult by Mrs. London. Patient seen and examined, chart reviewed. Patient reports having increased abdominal pain for the past 2 months. There is been episodes of nausea and vomiting perhaps up to twice a week. She is been experiencing diarrhea after eating. She states that she fell several days ago and has had back pain since then. X-ray findings indicate a S1 fracture. States she was having normal bowel movements about 2 months ago. She states she had a sinus infection about 2 months ago was not treated with antibiotics. She has a personal history of colonoscopy done in May 2014 with a finding of a hyperplastic polyps and some internal hemorrhoids negative otherwise.  Of note is the history of marked vascular disease including coronary artery bypass grafting, peripheral vascular catheterization with carotid PTA and stent placement as well as bilateral iliac stents. It is of note she was also found to have a stenosis of the superior mesenteric artery and underwent stent placement of that vessel as well. SHe apparently had no digestive-type symptoms prior to that finding. Review of her CT indicates extensive plaque formation throughout the aorta.  Patient currently on Cipro and Flagyl. Continue this. We'll obtain a stat two-way abdominal film assess stool burden she states she has not had a bowel movement for several days. We're restarting her proton pump inhibitor. Orthopedic consult pending. I recommended a vascular surgery consult and have discussed the case with Dr. Lucky Cowboy. Would recommend EGD when clinically feasible. Following.

## 2015-12-16 NOTE — Progress Notes (Signed)
[place] [facility] [place] NAME: Kirsten Castro    MR#:  [id number]  DATE OF BIRTH:  Apr 19, 1961  SUBJECTIVE:   Pt. Here w/ abdominal pain, N/V and suspected to have Colitis. Tolerating Clear liquid well.    REVIEW OF SYSTEMS:    Review of Systems  Constitutional: Negative for fever and chills.  HENT: Negative for congestion and tinnitus.   Eyes: Negative for blurred vision and double vision.  Respiratory: Negative for cough, shortness of breath and wheezing.   Cardiovascular: Negative for chest pain, orthopnea and PND.  Gastrointestinal: Positive for nausea, abdominal pain and diarrhea. Negative for vomiting.  Genitourinary: Negative for dysuria and hematuria.  Neurological: Negative for dizziness, sensory change and focal weakness.  All other systems reviewed and are negative.   Nutrition: Clear liquid Tolerating Diet: yes Tolerating PT: Ambulatory.    DRUG ALLERGIES:   Allergies  Allergen Reactions  . Ferrous Sulfate Hives    VITALS:  Blood pressure 106/61, pulse 85, temperature 97.8 F (36.6 C), temperature source Oral, resp. rate 17, height 5\' 1"  (1.549 m), weight 72.122 kg (159 lb), SpO2 96 %.  PHYSICAL EXAMINATION:   Physical Exam  GENERAL:  55 y.o.-year-old patient lying in the bed with no acute distress.  EYES: Pupils equal, round, reactive to light and accommodation. No scleral icterus. Extraocular muscles intact.  HEENT: Head atraumatic, normocephalic. Oropharynx and nasopharynx clear.  NECK:  Supple, no jugular venous distention. No thyroid enlargement, no tenderness.  LUNGS: Normal breath sounds bilaterally, no wheezing, rales, rhonchi. No use of accessory muscles of respiration.  CARDIOVASCULAR: S1, S2 normal. No murmurs, rubs, or gallops.  ABDOMEN: Soft, Tender diffusely, no rebound, rigidity, nondistended. Bowel sounds present. No organomegaly or mass.  EXTREMITIES: No cyanosis, clubbing or edema b/l.     NEUROLOGIC: Cranial nerves II through XII are intact. No focal Motor or sensory deficits b/l.   PSYCHIATRIC: The patient is alert and oriented x 3. Good affect.  SKIN: No obvious rash, lesion, or ulcer.    LABORATORY PANEL:   CBC  Recent Labs Lab 12/16/15 0529  WBC 6.1  HGB 14.1  HCT 42.3  PLT 205   ------------------------------------------------------------------------------------------------------------------  Chemistries   Recent Labs Lab 12/15/15 1625 12/16/15 0529  NA 132* 134*  K 4.3 4.3  CL 96* 100*  CO2 26 27  GLUCOSE 108* 73  BUN 19 14  CREATININE 0.68 0.61  CALCIUM 9.1 8.9  AST 18  --   ALT 17  --   ALKPHOS 80  --   BILITOT 1.1  --    ------------------------------------------------------------------------------------------------------------------  Cardiac Enzymes No results for input(s): TROPONINI in the last 168 hours. ------------------------------------------------------------------------------------------------------------------  RADIOLOGY:  Dg Chest 1 View  12/15/2015  CLINICAL DATA:  Chronic lower abdominal pain and diarrhea, with dark red blood in stool. Initial encounter. EXAM: CHEST 1 VIEW COMPARISON:  Chest radiograph performed 11/20/2015 FINDINGS: The lungs are well-aerated. Pulmonary vascularity is at the upper limits of focal. There is no evidence of focal opacification, pleural effusion or pneumothorax. The cardiomediastinal silhouette is mildly enlarged. The patient is status post median sternotomy. No acute osseous abnormalities are seen. IMPRESSION: Mild cardiomegaly.  Lungs remain grossly clear. Electronically Signed   By: Garald Balding M.D.   On: 12/15/2015 19:51   Dg Lumbar Spine Complete  12/15/2015  CLINICAL DATA:  Lower abdominal pain and diarrhea for 1 month. Twisting injury 2 nights ago with popping sound and now with low back pain.  EXAM: LUMBAR SPINE - COMPLETE 4+ VIEW COMPARISON:  CT scan from 12/07/2015. FINDINGS: Lateral  view shows superior endplate compression deformity of the L1 vertebral body, new since the CT scan 8 days ago, suggesting acute L1 superior endplate fracture. Remaining lumbar type vertebral bodies are of normal height. Intervertebral disc spaces are preserved. Advanced atherosclerotic calcification noted in the abdominal aorta with multiple arterial stent device is evident. IMPRESSION: L1 superior endplate compression fracture results in less than 25% loss of height at this level and is new since 12/07/2015. Electronically Signed   By: Misty Stanley M.D.   On: 12/15/2015 19:52     ASSESSMENT AND PLAN:   55 year old female with past medical history of CVA, coronary disease, COPD, hypertension who presented to the hospital due to abdominal pain nausea vomiting and diarrhea and suspected to have colitis.  #1 acute colitis-patient has been no abdominal pain, nausea vomiting for the past few days. -CT scan on 12/07/2015 shows no acute pathology. Since she clinically was not improving and she was admitted and started on IV ciprofloxacin and Flagyl. -Continue supportive care, IV antibiotics suspension, clear liquid diet. Await further GI input.  #2 essential hypertension-continue Coreg.  #3 history of CAD-no acute chest pain. -Continue statin, Coreg.  #4 anxiety-continue senna.  #5 history of COPD-no acute exacerbation. Continue Breo-Ellipta, Spiriva    All the records are reviewed and case discussed with Care Management/Social Workerr. Management plans discussed with the patient, family and they are in agreement.  CODE STATUS: Full Code  DVT Prophylaxis: Ambulatory/TED's SCD's.   TOTAL TIME TAKING CARE OF THIS PATIENT: 25 minutes.   POSSIBLE D/C IN 1-2 DAYS, DEPENDING ON CLINICAL CONDITION.   Henreitta Leber M.D on 12/16/2015 at 2:44 PM  Between 7am to 6pm - Pager - [phone]  After 6pm go to [email or web address] - password [email or web address] [facility]  [facility]  Office   [phone]  CC: Primary care physician; Adrian Prows, MD

## 2015-12-17 ENCOUNTER — Encounter
Admission: EM | Disposition: A | Discharge status: Home / Self Care | Payer: Self-pay | Source: Home / Self Care | Attending: Specialist

## 2015-12-17 ENCOUNTER — Encounter: Payer: Self-pay | Admitting: Anesthesiology

## 2015-12-17 ENCOUNTER — Inpatient Hospital Stay: Payer: BLUE CROSS/BLUE SHIELD

## 2015-12-17 HISTORY — PX: PERIPHERAL VASCULAR CATHETERIZATION: SHX172C

## 2015-12-17 SURGERY — ESOPHAGOGASTRODUODENOSCOPY (EGD) WITH PROPOFOL
Anesthesia: General

## 2015-12-17 SURGERY — VISCERAL ANGIOGRAPHY
Anesthesia: Moderate Sedation

## 2015-12-17 MED ORDER — DEXTROSE 5 % IV SOLN: 1.5000 g | Freq: Once | INTRAVENOUS | Status: DC

## 2015-12-17 MED ORDER — ACETAMINOPHEN 325 MG PO TABS
650.0000 mg | ORAL_TABLET | Freq: Four times a day (QID) | ORAL | Status: DC | PRN
Start: 2015-12-17 — End: 2015-12-18

## 2015-12-17 MED ORDER — FENTANYL CITRATE (PF) 100 MCG/2ML IJ SOLN
INTRAMUSCULAR | Status: AC
  Filled 2015-12-17: qty 2

## 2015-12-17 MED ORDER — HEPARIN SODIUM (PORCINE) 1000 UNIT/ML IJ SOLN
INTRAMUSCULAR | Status: DC | PRN
  Administered 2015-12-17: 4000 [IU] via INTRAVENOUS

## 2015-12-17 MED ORDER — MIDAZOLAM HCL 2 MG/2ML IJ SOLN
INTRAMUSCULAR | Status: AC
  Filled 2015-12-17: qty 2

## 2015-12-17 MED ORDER — HEPARIN (PORCINE) IN NACL 2-0.9 UNIT/ML-% IJ SOLN
INTRAMUSCULAR | Status: AC
  Filled 2015-12-17: qty 1000

## 2015-12-17 MED ORDER — MIDAZOLAM HCL 5 MG/5ML IJ SOLN
INTRAMUSCULAR | Status: AC
  Filled 2015-12-17: qty 5

## 2015-12-17 MED ORDER — HEPARIN SODIUM (PORCINE) 1000 UNIT/ML IJ SOLN
INTRAMUSCULAR | Status: AC
  Filled 2015-12-17: qty 1

## 2015-12-17 MED ORDER — LIDOCAINE HCL (PF) 1 % IJ SOLN
INTRAMUSCULAR | Status: AC
  Filled 2015-12-17: qty 30

## 2015-12-17 MED ORDER — SODIUM CHLORIDE 0.9 % IV SOLN
INTRAVENOUS | Status: DC
  Administered 2015-12-17 – 2015-12-18 (×2): via INTRAVENOUS

## 2015-12-17 MED ORDER — LIDOCAINE HCL (PF) 1 % IJ SOLN
INTRAMUSCULAR | Status: DC | PRN
Start: 2015-12-17 — End: 2015-12-17
  Administered 2015-12-17: 5 mL via INTRADERMAL

## 2015-12-17 MED ORDER — FENTANYL CITRATE (PF) 100 MCG/2ML IJ SOLN
INTRAMUSCULAR | Status: DC | PRN
  Administered 2015-12-17 (×5): 50 ug via INTRAVENOUS

## 2015-12-17 MED ORDER — MIDAZOLAM HCL 2 MG/2ML IJ SOLN
INTRAMUSCULAR | Status: DC | PRN
  Administered 2015-12-17 (×4): 1 mg via INTRAVENOUS
  Administered 2015-12-17: 2 mg via INTRAVENOUS

## 2015-12-17 MED ORDER — MAGNESIUM OXIDE 400 (241.3 MG) MG PO TABS
400.0000 mg | ORAL_TABLET | Freq: Two times a day (BID) | ORAL | Status: DC
  Administered 2015-12-17 – 2015-12-18 (×3): 400 mg via ORAL
  Filled 2015-12-17 (×3): qty 1

## 2015-12-17 MED ORDER — IOHEXOL 300 MG/ML  SOLN
INTRAMUSCULAR | Status: DC | PRN
  Administered 2015-12-17: 50 mL via INTRA_ARTERIAL

## 2015-12-17 MED ORDER — TRAZODONE HCL 50 MG PO TABS
50.0000 mg | ORAL_TABLET | Freq: Every evening | ORAL | Status: DC | PRN
  Administered 2015-12-17: 50 mg via ORAL
  Filled 2015-12-17 (×2): qty 1

## 2015-12-17 SURGICAL SUPPLY — 16 items
BALLN LUTONIX DCB 4X40X130 (BALLOONS) ×3
BALLON ARMADA 4X20X80 (BALLOONS) ×3 IMPLANT
BALLOON LUTONIX DCB 4X40X130 (BALLOONS) ×1 IMPLANT
CATH ANGIO 5F 80CM MHK1-H (CATHETERS) ×3 IMPLANT
CATH PIG 70CM (CATHETERS) ×3 IMPLANT
DEVICE PRESTO INFLATION (MISCELLANEOUS) ×3 IMPLANT
DEVICE STARCLOSE SE CLOSURE (Vascular Products) ×3 IMPLANT
NEEDLE ENTRY 21GA 7CM ECHOTIP (NEEDLE) ×3 IMPLANT
PACK ANGIOGRAPHY (CUSTOM PROCEDURE TRAY) ×3 IMPLANT
SET INTRO CAPELLA COAXIAL (SET/KITS/TRAYS/PACK) ×3 IMPLANT
SHEATH ANL2 6FRX45 HC (SHEATH) ×3 IMPLANT
SHEATH BRITE TIP 5FRX11 (SHEATH) ×3 IMPLANT
SYR MEDRAD MARK V 150ML (SYRINGE) ×3 IMPLANT
TUBING CONTRAST HIGH PRESS 72 (TUBING) ×3 IMPLANT
WIRE J 3MM .035X145CM (WIRE) ×3 IMPLANT
WIRE MAGIC TORQUE 260C (WIRE) ×3 IMPLANT

## 2015-12-17 NOTE — Op Note (Signed)
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  Abdominal aortogram  2.  Selective injection of the superior mesenteric artery first order catheter placement  3.  Percutaneous transluminal and plasty with a 4 mm Lutonix balloon superior mesenteric artery  4.  Star close femoral closure left common femoral artery    Anesthesia: Conscious sedation was administered under my direct supervision. IV Versed plus fentanyl were utilized. Continuous ECG, pulse oximetry and blood pressure was monitored throughout the entire procedure. A total of 6 milligrams of Versed and 250 micrograms of fentanyl were utilized.  Conscious sedation was administered for a total of 70 minutes.  Sheath: 6 [place] Ansell left common femoral artery  Contrast: 50 cc   Fluoroscopy Time: 8.2 minutes  Indications:  The patient presented to the hospital with increasing abdominal pain associated with bloody diarrhea. CT angiography demonstrated the stent was patent but there is concern regarding restenosis. No other abnormalities that would sufficiently explain her symptoms were identified. The risks and benefits for angiography with re-intervention were reviewed all questions answered patient agrees to proceed  Procedure:  Kirsten Castro a 55 y.o. female who was identified and appropriate procedural time out was performed.  The patient was then placed supine on the table and prepped and draped in the usual sterile fashion.  Ultrasound was used to evaluate the left common femoral artery.  It was patent .  A digital ultrasound image was acquired.  Amicropuncture needle was used to  access the left common femoral artery under direct ultrasound guidance and a permanent image wassaved for the record.microwire was then advanced under fluoroscopic guidance followed by micro-sheath.  A 0.035 J wire was advanced without resistance and a 5Fr sheath was placed.    Pigtail catheter was advanced to the level of T10 and AP projection of the aorta was obtained. SMA and stent were localized and did detector was repositioned in a lateral image was obtained. This demonstrated moderate to severe in-stent restenosis. The S1 catheter was then advanced over the wire in exchange for the pigtail catheter and using a floppy Glidewire the S1 catheter was used to select the SMA. Magnified imaging was then obtained which demonstrated what his measured to be approximately 55-60% diameter reduction in the previously stented segment.  4000 units of heparin was given the floppy Glidewire was reintroduced and advanced well into the SMA the S1 catheter was then advanced so that purchase had been achieved and the Glidewire exchanged for a Magic torque a 6 [place] Ansell sheath was then advanced up and over the wire and positioned with its tip of the sheath within the proximal margin of the stent. Initially a 4 x 2 [place] balloon is advanced across the stent and inflated to 10 atm for approximately 30 seconds. The sheath is then advanced over the balloon to allow for adequate purchase. A 4 x 40 Lutonix balloon is then advanced across the stent the sheath was backed up and the balloon is inflated to 8 atm for 1 minute. Follow-up imaging through the sheath demonstrates an excellent result with approximately 15% residual stenosis a marked improvement. At this point the procedure terminated the sheath is pulled back into the left external iliac oblique is obtained and a  Star close device deployed without difficulty. There are no immediate complications.  Findings:   Aortogram:  The abdominal aorta is patent previously placed  distal aortic bilateral common iliac stents are widely patent. The SMA stent is patent however as noted above in a lateral magnified image there is approximately 55-60% in-stent restenosis. Following angioplasty to 4 mm which matched the diameter of the stent measured with the computer and using a Lutonix balloon there is now a marked improvement with approximately 15% residual stenosis. Distal outflow is unchanged.   Disposition: Patient was taken to the recovery room in stable condition having tolerated the procedure well.  SUMMARY: Successful treatment of in-stent restenosis superior mesenteric artery  Schnier, Dolores Lory 12/17/2015,2:41 PM

## 2015-12-17 NOTE — Consult Note (Signed)
[facility] Vascular Consult Note  MRN : [id number]  Pernella Reinholtz is a 55 y.o. (10/26/1960) female who presents with chief complaint of  Chief Complaint  Patient presents with  . Abdominal Pain   History of Present Illness:   The patient is a 55 year old female with a known history of stroke, coronary artery disease, COPD, angina, hypertension, melanoma of the skin, multiple vascular procedures and coronary artery bypass surgery well know to our service admitted to the hospital with a chief complaint of abdominal pain and bloody diarrhea.  1) Left Carotid Stent Placement: 02/10/2015 2) SMA Stent placement - 2015?  Patient endorses a history of abdominal pain and diarrhea for two weeks. States her abdominal pain started in the epigastric area and is now radiating toward her lower abdomen and back. Describes the pain as constant worsening after eating. Some nausea but not vomiting associated with the pain after she eats. She states her bowel movements were "bloody" - describes them as painless and streaked with red blood. She was seen by her gastroenterologist and sent to ER for admission and further evaluation. Patient admitted and started on IV ciprofloxacin and Flagyl.  CT scan on 12/07/2015 shows no acute pathology.   Consulted by Dr. Gustavo Lah for possible mesenteric angiogram.  Current Facility-Administered Medications  Medication Dose Route Frequency Provider Last Rate Last Dose  . 0.9 %  sodium chloride infusion   Intravenous Continuous Janalyn Harder Stegmayer, PA-C 75 mL/hr at 12/17/15 1117    . ALPRAZolam Duanne Moron) tablet 1 mg  1 mg Oral TID Vaughan Basta, MD   1 mg at 12/17/15 1025  . atorvastatin (LIPITOR) tablet 80 mg  80 mg Oral Daily Vaughan Basta, MD   80 mg at 12/17/15 1025  . carvedilol (COREG) tablet 12.5 mg  12.5 mg Oral BID WC Vaughan Basta, MD   12.5 mg at 12/17/15 0811  . ciprofloxacin (CIPRO) IVPB 400 mg  400 mg  Intravenous Q12H Lenis Noon, RPH   400 mg at 12/17/15 1031  . fluticasone furoate-vilanterol (BREO ELLIPTA) 200-25 MCG/INH 1 puff  1 puff Inhalation Daily Vaughan Basta, MD   1 puff at 12/17/15 1028  . loratadine (CLARITIN) tablet 10 mg  10 mg Oral Daily Vaughan Basta, MD   10 mg at 12/17/15 1025  . magnesium oxide (MAG-OX) tablet 400 mg  400 mg Oral BID WC Lenis Noon, RPH   400 mg at 12/17/15 [id number]  . metroNIDAZOLE (FLAGYL) IVPB 500 mg  500 mg Intravenous [address] [place], RPH   500 mg at 12/17/15 [id number]  . morphine 4 MG/ML injection 4 mg  4 mg Intravenous Q4H PRN Vaughan Basta, MD   4 mg at 12/17/15 1117  . ondansetron (ZOFRAN) injection 4 mg  4 mg Intravenous Q6H PRN Vaughan Basta, MD   4 mg at 12/17/15 1035  . pantoprazole (PROTONIX) EC tablet 40 mg  40 mg Oral Daily Christiane Landis Martins, NP   40 mg at 12/17/15 1025  . tiotropium (SPIRIVA) inhalation capsule 18 mcg  18 mcg Inhalation Daily Lenis Noon, RPH   18 mcg at 12/17/15 [id number]  . traMADol (ULTRAM) tablet 50 mg  50 mg Oral Q6H PRN Vaughan Basta, MD      . traZODone (DESYREL) tablet 25-50 mg  25-50 mg Oral QHS PRN Vaughan Basta, MD   50 mg at 12/16/15 2245    Past Medical History  Diagnosis Date  . Stroke ([place])   .  Coronary artery disease   . COPD (chronic obstructive pulmonary disease) ([place])   . Myocardial infarction ([place])   . Anginal pain ([place])   . Hypertension   . Cancer [facility])     melanoma skin cancer    Past Surgical History  Procedure Laterality Date  . Coronary artery bypass graft    . Peripheral vascular catheterization N/A 02/10/2015    Procedure: Carotid PTA/Stent Intervention;  Surgeon: Katha Cabal, MD;  Location: [facility];  Service: Cardiovascular;  Laterality: N/A;  . Stent placement vascular ([facility] hx)      Social History Social History  Substance Use Topics  . Smoking status: Former Research scientist (life sciences)  . Smokeless tobacco: None  . Alcohol Use: No     Family History Family History  Problem Relation Age of Onset  . Breast cancer Sister   Denies family history of bleeding / clotting disorders, PAD or porphyria.  Allergies  Allergen Reactions  . Ferrous Sulfate Hives   REVIEW OF SYSTEMS (Negative unless checked)  Constitutional: [] Weight loss  [] Fever  [] Chills Cardiac: [] Chest pain   [] Chest pressure   [] Palpitations   [] Shortness of breath when laying flat   [] Shortness of breath at rest   [] Shortness of breath with exertion. Vascular:  [] Pain in legs with walking   [] Pain in legs at rest   [] Pain in legs when laying flat   [] Claudication   [] Pain in feet when walking  [] Pain in feet at rest  [] Pain in feet when laying flat   [] History of DVT   [] Phlebitis   [] Swelling in legs   [] Varicose veins   [] Non-healing ulcers Pulmonary:   [] Uses home oxygen   [] Productive cough   [] Hemoptysis   [] Wheeze  [x] COPD   [] Asthma Neurologic:  [] Dizziness  [] Blackouts   [] Seizures   [] History of stroke   [x] History of TIA  [] Aphasia   [] Temporary blindness   [] Dysphagia   [] Weakness or numbness in arms   [] Weakness or numbness in legs Musculoskeletal:  [] Arthritis   [] Joint swelling   [] Joint pain   [] Low back pain Hematologic:  [] Easy bruising  [] Easy bleeding   [] Hypercoagulable state   [] Anemic  [] Hepatitis Gastrointestinal:  [x] Blood in stool   [] Vomiting blood  [x] Gastroesophageal reflux/heartburn   [] Difficulty swallowing. Genitourinary:  [] Chronic kidney disease   [] Difficult urination  [] Frequent urination  [] Burning with urination   [] Blood in urine Skin:  [] Rashes   [] Ulcers   [] Wounds Psychological:  [] History of anxiety   []  History of major depression.  Physical Examination  Filed Vitals:   12/16/15 1240 12/16/15 2115 12/17/15 0811 12/17/15 1159  BP: 106/61 92/53 120/70 123/69  Pulse: 85 78 78 78  Temp: 97.8 F (36.6 C) 97.8 F (36.6 C)  98.3 F (36.8 C)  TempSrc: Oral Oral  Oral  Resp: 17 20  18   Height:      Weight:       SpO2: 96% 93%  91%   Body mass index is 30.06 kg/(m^2).  Gen:  WD/WN, NAD Head: [place]/AT, No temporalis wasting. Prominent temp pulse not noted. Ear/Nose/Throat: Hearing grossly intact, nares w/o erythema or drainage, oropharynx w/o Erythema/Exudate Eyes: PERRLA, EOMI.  Neck: Supple, no nuchal rigidity.  No bruit or JVD.  Pulmonary:  Good air movement, clear to auscultation bilaterally.  Cardiac: RRR Vascular:  Vessel Right Left  Radial Palpable Palpable  Ulnar Palpable Palpable  Brachial Palpable Palpable  Carotid Palpable, without bruit Palpable, without bruit  Aorta Not palpable N/A  Femoral Palpable Palpable  Popliteal Palpable Palpable  PT Palpable Palpable  DP Palpable Palpable   Gastrointestinal: soft, non-tender/non-distended. No guarding/reflex. No masses. Musculoskeletal: M/S 5/5 throughout.  Extremities without ischemic changes.  No deformity or atrophy. No edema. Neurologic: CN 2-12 intact. Pain and light touch intact in extremities.  Symmetrical.  Speech is fluent. Motor exam as listed above. Psychiatric: Judgment intact, Mood & affect appropriate for pt's clinical situation. Dermatologic: No rashes or ulcers noted.  No cellulitis or open wounds. Lymph : No Cervical, Axillary, or Inguinal lymphadenopathy.  CBC Lab Results  Component Value Date   WBC 6.1 12/16/2015   HGB 14.1 12/16/2015   HCT 42.3 12/16/2015   MCV 100.5* 12/16/2015   PLT 205 12/16/2015   BMET    Component Value Date/Time   NA 134* 12/16/2015 0529   NA 137 12/30/2013 2351   K 4.3 12/16/2015 0529   K 3.1* 12/30/2013 2351   CL 100* 12/16/2015 0529   CL 105 12/30/2013 2351   CO2 27 12/16/2015 0529   CO2 25 12/30/2013 2351   GLUCOSE 73 12/16/2015 0529   GLUCOSE 146* 12/30/2013 2351   BUN 14 12/16/2015 0529   BUN 13 02/17/2014 0758   CREATININE 0.61 12/16/2015 0529   CREATININE 1.02 02/17/2014 0758   CALCIUM 8.9 12/16/2015 0529   CALCIUM 9.1 12/30/2013 2351   GFRNONAA >60 12/16/2015 0529    GFRNONAA >60 02/17/2014 0758   GFRAA >60 12/16/2015 0529   GFRAA >60 02/17/2014 0758   Estimated Creatinine Clearance: 74.7 mL/min (by C-G formula based on Cr of 0.61).  COAG No results found for: INR, PROTIME  Radiology Dg Chest 1 View  12/15/2015  CLINICAL DATA:  Chronic lower abdominal pain and diarrhea, with dark red blood in stool. Initial encounter. EXAM: CHEST 1 VIEW COMPARISON:  Chest radiograph performed 11/20/2015 FINDINGS: The lungs are well-aerated. Pulmonary vascularity is at the upper limits of focal. There is no evidence of focal opacification, pleural effusion or pneumothorax. The cardiomediastinal silhouette is mildly enlarged. The patient is status post median sternotomy. No acute osseous abnormalities are seen. IMPRESSION: Mild cardiomegaly.  Lungs remain grossly clear. Electronically Signed   By: Garald Balding M.D.   On: 12/15/2015 19:51   Dg Chest 2 View  11/20/2015  CLINICAL DATA:  Pt to ED via EMS c/o shortness of breath, right side chest pain, states h/o thoracentesis on right side, multiple cardiac surgeries, reports severe infection following surgery in August 2015 that resulted in the removal of her sternum and surrounding bone. EXAM: CHEST  2 VIEW COMPARISON:  02/10/2015 FINDINGS: Cardiac silhouette is mildly enlarged. There changes from CABG surgery. Cardiac stents are visualized. There is a stent above the aortic arch which is stable. No mediastinal or hilar masses or evidence of adenopathy. Lungs are mildly hyperexpanded but clear. No pleural effusion or pneumothorax. Sternum absent.  Bony thorax is demineralized. IMPRESSION: No acute cardiopulmonary disease. Electronically Signed   By: Lajean Manes M.D.   On: 11/20/2015 13:52   Dg Lumbar Spine Complete  12/15/2015  CLINICAL DATA:  Lower abdominal pain and diarrhea for 1 month. Twisting injury 2 nights ago with popping sound and now with low back pain. EXAM: LUMBAR SPINE - COMPLETE 4+ VIEW COMPARISON:  CT scan  from 12/07/2015. FINDINGS: Lateral view shows superior endplate compression deformity of the L1 vertebral body, new since the CT scan 8 days ago, suggesting acute L1 superior endplate fracture. Remaining lumbar type vertebral bodies are of normal height. Intervertebral disc  spaces are preserved. Advanced atherosclerotic calcification noted in the abdominal aorta with multiple arterial stent device is evident. IMPRESSION: L1 superior endplate compression fracture results in less than 25% loss of height at this level and is new since 12/07/2015. Electronically Signed   By: Misty Stanley M.D.   On: 12/15/2015 19:52   Ct Abdomen W Contrast  12/07/2015  CLINICAL DATA:  Severe chest pain upper abdominal pain with nausea and vomiting for several weeks. EXAM: CT ABDOMEN WITH CONTRAST TECHNIQUE: Multidetector CT imaging of the abdomen was performed using the standard protocol following bolus administration of intravenous contrast. CONTRAST:  153mL OMNIPAQUE IOHEXOL 300 MG/ML  SOLN COMPARISON:  None. FINDINGS: Lower chest:  Lung bases are clear. Hepatobiliary: No focal hepatic lesion.  Normal gallbladder. Pancreas: Normal pancreatic parenchymal intensity. No ductal dilatation or inflammation. Spleen: Normal spleen. Adrenals/urinary tract: Thickened adrenal glands likely represents hyperplasia. No renal abnormality. Stomach/Bowel: Stomach, duodenum and limited view of the bowel is unremarkable. Vascular/Lymphatic: Calcification of the aorta. SMA stent noted. No retroperitoneal periportal lymphadenopathy present. Musculoskeletal: No aggressive osseous lesion IMPRESSION: 1. No explanation for nausea and vomiting abdominal pain. 2. Atherosclerotic calcification of the aorta. SMA stent appears patent Electronically Signed   By: Suzy Bouchard M.D.   On: 12/07/2015 12:08   Dg Abd 2 Views  12/16/2015  CLINICAL DATA:  Lower abdominal pain for several months. EXAM: ABDOMEN - 2 VIEW COMPARISON:  None. FINDINGS: The lung bases  are clear. The heart is enlarged. Coronary artery stents are noted along with surgical changes from bypass surgery. Bilateral iliac artery stents are noted. There is moderate scattered stool and air throughout the colon and down into the rectum. There are also air-filled loops of small bowel without distention or air-fluid levels. The bony structures are intact. IMPRESSION: No plain film findings for an acute abdominal process. Electronically Signed   By: Marijo Sanes M.D.   On: 12/16/2015 17:55   Assessment/Plan The patient is a 55 year old female with a known history of stroke, coronary artery disease, COPD, angina, hypertension, melanoma of the skin, multiple vascular procedures and coronary artery bypass surgery well know to our service admitted to the hospital with a chief complaint of abdominal pain and bloody diarrhea. She is s/p Left Carotid Stent Placement: 02/10/2015 & SMA Stent placement - 2015 by our service.  1) Patient with SMA stent placement about two years ago. Will perform mesenteric angiogram to assess patency of stent and visualize if any additional mesenteric arteries are stenosed contributing to the patients abdominal and post-prandial pain. 2) Will plan on angiogram today if schedule permits. If unable to complete today will plan for angiogram on Tues. 3) Discussed procedure with patient and husband at bedside. Benefits and risks explained. All questions answered. Patient wishes to proceed.  4) Essential Hypertension: Continue Coreg. 5) CAD: Continue statin. 6) COPD: no acute exacerbation. Continue Breo-Ellipta, Spiriva 7) Discussed with Dr. Francene Castle, PA-C  12/17/2015 12:56 PM

## 2015-12-17 NOTE — Progress Notes (Signed)
[facility] [place] NAME: Kirsten Castro    MR#:  [id number]  DATE OF BIRTH:  Nov 05, 1960  SUBJECTIVE:   Pt. Here w/ abdominal pain, N/V and suspected to have Colitis. Still continues to have abdominal pain but no diarrhea.  No N/V.      REVIEW OF SYSTEMS:    Review of Systems  Constitutional: Negative for fever and chills.  HENT: Negative for congestion and tinnitus.   Eyes: Negative for blurred vision and double vision.  Respiratory: Negative for cough, shortness of breath and wheezing.   Cardiovascular: Negative for chest pain, orthopnea and PND.  Gastrointestinal: Positive for nausea, abdominal pain and diarrhea. Negative for vomiting.  Genitourinary: Negative for dysuria and hematuria.  Neurological: Negative for dizziness, sensory change and focal weakness.  All other systems reviewed and are negative.   Nutrition: Clear liquid Tolerating Diet: yes Tolerating PT: Ambulatory.    DRUG ALLERGIES:   Allergies  Allergen Reactions  . Ferrous Sulfate Hives    VITALS:  Blood pressure 100/69, pulse 74, temperature 98.3 F (36.8 C), temperature source Oral, resp. rate 16, height 5\' 1"  (1.549 m), weight 72.122 kg (159 lb), SpO2 98 %.  PHYSICAL EXAMINATION:   Physical Exam  GENERAL:  55 y.o.-year-old patient lying in the bed with no acute distress.  EYES: Pupils equal, round, reactive to light and accommodation. No scleral icterus. Extraocular muscles intact.  HEENT: Head atraumatic, normocephalic. Oropharynx and nasopharynx clear.  NECK:  Supple, no jugular venous distention. No thyroid enlargement, no tenderness.  LUNGS: Normal breath sounds bilaterally, no wheezing, rales, rhonchi. No use of accessory muscles of respiration.  CARDIOVASCULAR: S1, S2 normal. No murmurs, rubs, or gallops.  ABDOMEN: Soft, Tender diffusely, no rebound, rigidity, nondistended. Bowel sounds present. No organomegaly or mass.  EXTREMITIES: No  cyanosis, clubbing or edema b/l.    NEUROLOGIC: Cranial nerves II through XII are intact. No focal Motor or sensory deficits b/l.   PSYCHIATRIC: The patient is alert and oriented x 3. Good affect.  SKIN: No obvious rash, lesion, or ulcer.    LABORATORY PANEL:   CBC  Recent Labs Lab 12/16/15 0529  WBC 6.1  HGB 14.1  HCT 42.3  PLT 205   ------------------------------------------------------------------------------------------------------------------  Chemistries   Recent Labs Lab 12/15/15 1625 12/16/15 0529  NA 132* 134*  K 4.3 4.3  CL 96* 100*  CO2 26 27  GLUCOSE 108* 73  BUN 19 14  CREATININE 0.68 0.61  CALCIUM 9.1 8.9  AST 18  --   ALT 17  --   ALKPHOS 80  --   BILITOT 1.1  --    ------------------------------------------------------------------------------------------------------------------  Cardiac Enzymes No results for input(s): TROPONINI in the last 168 hours. ------------------------------------------------------------------------------------------------------------------  RADIOLOGY:  Dg Chest 1 View  12/15/2015  CLINICAL DATA:  Chronic lower abdominal pain and diarrhea, with dark red blood in stool. Initial encounter. EXAM: CHEST 1 VIEW COMPARISON:  Chest radiograph performed 11/20/2015 FINDINGS: The lungs are well-aerated. Pulmonary vascularity is at the upper limits of focal. There is no evidence of focal opacification, pleural effusion or pneumothorax. The cardiomediastinal silhouette is mildly enlarged. The patient is status post median sternotomy. No acute osseous abnormalities are seen. IMPRESSION: Mild cardiomegaly.  Lungs remain grossly clear. Electronically Signed   By: Garald Balding M.D.   On: 12/15/2015 19:51   Dg Lumbar Spine Complete  12/15/2015  CLINICAL DATA:  Lower abdominal pain and diarrhea for 1 month. Twisting injury 2 nights  ago with popping sound and now with low back pain. EXAM: LUMBAR SPINE - COMPLETE 4+ VIEW COMPARISON:  CT scan  from 12/07/2015. FINDINGS: Lateral view shows superior endplate compression deformity of the L1 vertebral body, new since the CT scan 8 days ago, suggesting acute L1 superior endplate fracture. Remaining lumbar type vertebral bodies are of normal height. Intervertebral disc spaces are preserved. Advanced atherosclerotic calcification noted in the abdominal aorta with multiple arterial stent device is evident. IMPRESSION: L1 superior endplate compression fracture results in less than 25% loss of height at this level and is new since 12/07/2015. Electronically Signed   By: Misty Stanley M.D.   On: 12/15/2015 19:52   Dg Abd 2 Views  12/16/2015  CLINICAL DATA:  Lower abdominal pain for several months. EXAM: ABDOMEN - 2 VIEW COMPARISON:  None. FINDINGS: The lung bases are clear. The heart is enlarged. Coronary artery stents are noted along with surgical changes from bypass surgery. Bilateral iliac artery stents are noted. There is moderate scattered stool and air throughout the colon and down into the rectum. There are also air-filled loops of small bowel without distention or air-fluid levels. The bony structures are intact. IMPRESSION: No plain film findings for an acute abdominal process. Electronically Signed   By: Marijo Sanes M.D.   On: 12/16/2015 17:55     ASSESSMENT AND PLAN:   55 year old female with past medical history of CVA, coronary disease, COPD, hypertension who presented to the hospital due to abdominal pain nausea vomiting and diarrhea and suspected to have colitis.  #1 acute colitis- (infectious (vs) ischemic given her hx of SMA stent, still continues to have abdominal pain, but no nausea vomiting. -CT scan of abd/pelvis on 12/07/2015 shows no acute pathology. Patient has had a previous history of a superior mesenteric artery stent. A vascular surgery consult obtained and patient underwent angiogram today which showed in stent restenosis which was successfully angioplastied.  -Continue  supportive care, IV antibiotics (Cipro, Flagyl), clear liquid diet. - appreciate GI/Vascular input.   #2 compression fracture-patient had a fall a few days back prior to admission. Patient's x-ray of the lumbar spine showed a possible L1 compression fracture. I will get a CT scan of the lumbar spine to further evaluate this. -If needed consider orthopedic consult. Continue supportive care with pain control for now.  #3 essential hypertension-continue Coreg.  #4 history of CAD-no acute chest pain. -Continue statin, Coreg.  #5 anxiety-continue senna.  #6 history of COPD-no acute exacerbation. Continue Breo-Ellipta, Spiriva   All the records are reviewed and case discussed with Care Management/Social Workerr. Management plans discussed with the patient, family and they are in agreement.  CODE STATUS: Full Code  DVT Prophylaxis: Ambulatory/TED's SCD's.   TOTAL TIME TAKING CARE OF THIS PATIENT: 30 minutes.   POSSIBLE D/C IN 1-2 DAYS, DEPENDING ON CLINICAL CONDITION.   Henreitta Leber M.D on 12/17/2015 at 3:45 PM  Between 7am to 6pm - Pager - [phone]  After 6pm go to [email or web address] - password [email or web address] [facility]  [facility]  Office  [phone]  CC: Primary care physician; Adrian Prows, MD

## 2015-12-17 NOTE — Consult Note (Signed)
Subjective: Patient seen for abdominal pain, nausea and vomiting, recent rectal bleeding. Patient appears comfortable currently. Has had no nausea or vomiting overnight. She has had no bowel movement overnight.  Objective: Vital signs in last 24 hours: Temp:  [97.8 F (36.6 C)-98.3 F (36.8 C)] 98.3 F (36.8 C) (03/17 1159) Pulse Rate:  [78] 78 (03/17 1159) Resp:  [18-20] 18 (03/17 1159) BP: (92-123)/(53-70) 123/69 mmHg (03/17 1159) SpO2:  [91 %-93 %] 91 % (03/17 1159) Blood pressure 123/69, pulse 78, temperature 98.3 F (36.8 C), temperature source Oral, resp. rate 18, height 5\' 1"  (1.549 m), weight 72.122 kg (159 lb), SpO2 91 %.   Intake/Output from previous day: 03/16 0701 - 03/17 0700 In: 750 [P.O.:750] Out: 2700 [Urine:2700]  Intake/Output this shift: Total I/O In: 270 [P.O.:270] Out: 300 [Urine:300]   General appearance:  A 55 year old female no distress Resp:  Laterally clear to auscultation Cardio:  Regular rate and rhythm GI:  Soft mild discomfort generally to palpation. No masses or rebound. Sounds are positive normoactive. Extremities:  No clubbing cyanosis or edema   Lab Results: No results found for this or any previous visit (from the past 24 hour(s)).    Recent Labs  12/15/15 1625 12/16/15 0529  WBC 7.9 6.1  HGB 14.7 14.1  HCT 44.5 42.3  PLT 231 205   BMET  Recent Labs  12/15/15 1625 12/16/15 0529  NA 132* 134*  K 4.3 4.3  CL 96* 100*  CO2 26 27  GLUCOSE 108* 73  BUN 19 14  CREATININE 0.68 0.61  CALCIUM 9.1 8.9   LFT  Recent Labs  12/15/15 1625  PROT 6.9  ALBUMIN 3.9  AST 18  ALT 17  ALKPHOS 80  BILITOT 1.1   PT/INR No results for input(s): LABPROT, INR in the last 72 hours. Hepatitis Panel No results for input(s): HEPBSAG, HCVAB, HEPAIGM, HEPBIGM in the last 72 hours. C-Diff No results for input(s): CDIFFTOX in the last 72 hours. No results for input(s): CDIFFPCR in the last 72 hours.   Studies/Results: Dg Chest 1  View  12/15/2015  CLINICAL DATA:  Chronic lower abdominal pain and diarrhea, with dark red blood in stool. Initial encounter. EXAM: CHEST 1 VIEW COMPARISON:  Chest radiograph performed 11/20/2015 FINDINGS: The lungs are well-aerated. Pulmonary vascularity is at the upper limits of focal. There is no evidence of focal opacification, pleural effusion or pneumothorax. The cardiomediastinal silhouette is mildly enlarged. The patient is status post median sternotomy. No acute osseous abnormalities are seen. IMPRESSION: Mild cardiomegaly.  Lungs remain grossly clear. Electronically Signed   By: Garald Balding M.D.   On: 12/15/2015 19:51   Dg Lumbar Spine Complete  12/15/2015  CLINICAL DATA:  Lower abdominal pain and diarrhea for 1 month. Twisting injury 2 nights ago with popping sound and now with low back pain. EXAM: LUMBAR SPINE - COMPLETE 4+ VIEW COMPARISON:  CT scan from 12/07/2015. FINDINGS: Lateral view shows superior endplate compression deformity of the L1 vertebral body, new since the CT scan 8 days ago, suggesting acute L1 superior endplate fracture. Remaining lumbar type vertebral bodies are of normal height. Intervertebral disc spaces are preserved. Advanced atherosclerotic calcification noted in the abdominal aorta with multiple arterial stent device is evident. IMPRESSION: L1 superior endplate compression fracture results in less than 25% loss of height at this level and is new since 12/07/2015. Electronically Signed   By: Misty Stanley M.D.   On: 12/15/2015 19:52   Dg Abd 2 Views  12/16/2015  CLINICAL DATA:  Lower abdominal pain for several months. EXAM: ABDOMEN - 2 VIEW COMPARISON:  None. FINDINGS: The lung bases are clear. The heart is enlarged. Coronary artery stents are noted along with surgical changes from bypass surgery. Bilateral iliac artery stents are noted. There is moderate scattered stool and air throughout the colon and down into the rectum. There are also air-filled loops of small  bowel without distention or air-fluid levels. The bony structures are intact. IMPRESSION: No plain film findings for an acute abdominal process. Electronically Signed   By: Marijo Sanes M.D.   On: 12/16/2015 17:55    Scheduled Inpatient Medications:   . ALPRAZolam  1 mg Oral TID  . atorvastatin  80 mg Oral Daily  . carvedilol  12.5 mg Oral BID WC  . ciprofloxacin  400 mg Intravenous Q12H  . fluticasone furoate-vilanterol  1 puff Inhalation Daily  . loratadine  10 mg Oral Daily  . magnesium oxide  400 mg Oral BID WC  . metronidazole  500 mg Intravenous Q8H  . pantoprazole  40 mg Oral Daily  . tiotropium  18 mcg Inhalation Daily    Continuous Inpatient Infusions:   . sodium chloride 75 mL/hr at 12/17/15 1117    PRN Inpatient Medications:  morphine injection, ondansetron (ZOFRAN) IV, traMADol, traZODone  Miscellaneous:   Assessment:  1. Abdominal pain. This seems to be improving somewhat. He had no nausea or vomiting over the past 24 hours. His had no bowel movement and no evidence of bleeding. Imaging studies not informative. Dr. Candace Cruise was covering call this weekend 2. New lumbar vertebra fracture 3. 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  Plan:  1. Awaiting vascular surgery evaluation 2. Depending on whether there is interventional #1 would consider doing EGD and a flexible sigmoidoscopy. This will not be done until Monday if patient is still in the hospital. If her symptoms are improved this could be followed up as outpatient. I would continue her on a twice a day PPI. Of note she has been on a PPI her to admission to the hospital however a lower dose. 3. Dr. Candace Cruise was covering call this weekend.  Lollie Sails MD 12/17/2015, 12:55 PM

## 2015-12-17 NOTE — Progress Notes (Signed)
Paged Dr. Gustavo Lah and Endoscopy will not be done until Monday received order for clear liquid diet and advance to full liquid in the morning

## 2015-12-18 MED ORDER — OXYCODONE-ACETAMINOPHEN 5-325 MG PO TABS: 1.0000 | ORAL_TABLET | Freq: Four times a day (QID) | ORAL | Status: DC | PRN

## 2015-12-18 MED ORDER — PANTOPRAZOLE SODIUM 40 MG PO TBEC: 40.0000 mg | DELAYED_RELEASE_TABLET | Freq: Two times a day (BID) | ORAL | Status: DC

## 2015-12-18 NOTE — Discharge Instructions (Signed)
Take medications as prescribed.  Please contact your doctor's office if you should experience any nausea, vomiting, fever, diarrhea and if you have any questions or concerns.

## 2015-12-18 NOTE — Consult Note (Signed)
  GI Inpatient Follow-up Note  Patient Identification: Olanna Schwerdt is a 55 y.o. female with stenosis of SMA stent that was opened up by vascular surgery.   Subjective: Overall abdominal pain much better. No more nausea,vomiting, or diarrhea. Tolerating full liquid diet. Main complaint is back pain from compression fracture. Would like to go home today if possible.  Scheduled Inpatient Medications:  . ALPRAZolam  1 mg Oral TID  . atorvastatin  80 mg Oral Daily  . carvedilol  12.5 mg Oral BID WC  . cefUROXime (ZINACEF)  IV  1.5 g Intravenous Once  . ciprofloxacin  400 mg Intravenous Q12H  . fluticasone furoate-vilanterol  1 puff Inhalation Daily  . loratadine  10 mg Oral Daily  . magnesium oxide  400 mg Oral BID WC  . metronidazole  500 mg Intravenous Q8H  . pantoprazole  40 mg Oral Daily  . tiotropium  18 mcg Inhalation Daily    Continuous Inpatient Infusions:   . sodium chloride 75 mL/hr at 12/18/15 0344    PRN Inpatient Medications:  acetaminophen, morphine injection, ondansetron (ZOFRAN) IV, traMADol, traZODone  Review of Systems: Constitutional: Weight is stable.  Eyes: No changes in vision. ENT: No oral lesions, sore throat.  GI: see HPI.  Heme/Lymph: No easy bruising.  CV: No chest pain.  GU: No hematuria.  Integumentary: No rashes.  Neuro: No headaches.  Psych: No depression/anxiety.  Endocrine: No heat/cold intolerance.  Allergic/Immunologic: No urticaria.  Resp: No cough, SOB.  Musculoskeletal: No joint swelling.    Physical Examination: BP 128/61 mmHg  Pulse 81  Temp(Src) 98.2 F (36.8 C) (Oral)  Resp 24  Ht 5\' 1"  (1.549 m)  Wt 72.122 kg (159 lb)  BMI 30.06 kg/m2  SpO2 95% Gen: NAD, alert and oriented x 4 HEENT: PEERLA, EOMI, Neck: supple, no JVD or thyromegaly Chest: CTA bilaterally, no wheezes, crackles, or other adventitious sounds CV: RRR, no m/g/c/r Abd: soft, Nontender, ND, +BS in all four quadrants; no HSM, guarding, ridigity, or  rebound tenderness Ext: no edema, well perfused with 2+ pulses, Skin: no rash or lesions noted Lymph: no LAD  Data: Lab Results  Component Value Date   WBC 6.1 12/16/2015   HGB 14.1 12/16/2015   HCT 42.3 12/16/2015   MCV 100.5* 12/16/2015   PLT 205 12/16/2015    Recent Labs Lab 12/15/15 1625 12/16/15 0529  HGB 14.7 14.1   Lab Results  Component Value Date   NA 134* 12/16/2015   K 4.3 12/16/2015   CL 100* 12/16/2015   CO2 27 12/16/2015   BUN 14 12/16/2015   CREATININE 0.61 12/16/2015   Lab Results  Component Value Date   ALT 17 12/15/2015   AST 18 12/15/2015   ALKPHOS 80 12/15/2015   BILITOT 1.1 12/15/2015   No results for input(s): APTT, INR, PTT in the last 168 hours. Assessment/Plan: Ms. Strutz is a 55 y.o. female with abdominal pain. Much better after procedure by vascular surgery.  Recommendations: Advance diet to solids. If tolerated, ok for discharge to home later today as long as pt has pain meds for her back. Thanks. Please call with questions or concerns.  Yuriy Cui, Lupita Dawn, MD

## 2015-12-18 NOTE — Discharge Summary (Addendum)
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 Stroke ([place])   . Coronary artery disease   . COPD (chronic obstructive pulmonary disease) ([place])   . Myocardial infarction ([place])   . Anginal pain ([place])   . Hypertension   . Cancer ([place])     melanoma skin cancer    HOSPITAL COURSE:   55 year old female with past medical history of CVA, coronary disease, COPD, hypertension who presented to the hospital due to abdominal pain nausea vomiting and diarrhea and suspected to have colitis.  #1 acute colitis- (infectious (vs) ischemic given her hx of SMA stent, still continues to have abdominal pain, but no nausea vomiting. -CT scan of abd/pelvis on 12/07/2015 shows no acute pathology. Patient has had a previous history of a superior mesenteric artery stent. A vascular surgery consult obtained and patient underwent angiogram today which showed in stent restenosis which was successfully angioplastied.  -Continue supportive care, was on IV antibiotics (Cipro, Flagyl)---wbc normal, no diarrhea will d/c abxs -tolerating full liq diet. -pain was likely due to ischemic colitis then ifectious - appreciate GI/Vascular input.   #2 compression fracture-patient had a fall a few days back prior to admission. Patient's x-ray of the lumbar spine showed a possible L1 compression fracture.CT scan of the lumbar spine confirmed it. Curbside ortho and  recommends supportive care _LS corset rxed -Continue supportive care with pain control for now.  #3 essential hypertension-continue Coreg.  #4 history of CAD-no acute chest pain. -Continue statin, Coreg.  #5 anxiety-continue xanax  #6 history of COPD-no acute exacerbation. Continue Breo-Ellipta, Spiriva  Overall better. Will d/c later today after lunch  CONSULTS OBTAINED:  Treatment Team:  Lollie Sails, MD  DRUG ALLERGIES:   Allergies  Allergen Reactions  . Ferrous Sulfate Hives    DISCHARGE MEDICATIONS:   Current Discharge Medication List    START taking these medications   Details  oxyCODONE-acetaminophen (PERCOCET/ROXICET) 5-325 MG tablet Take 1 tablet by mouth every 6 (six) hours as needed for moderate pain or severe pain. Qty: 30 tablet, Refills: 0      CONTINUE these medications which have NOT CHANGED   Details  acetaminophen (TYLENOL) 500 MG tablet Take 1,000 mg by mouth every 6 (six) hours as needed for mild pain or headache.    ALPRAZolam (XANAX) 1 MG tablet Take 1 mg by mouth 3 (three) times daily.    aspirin 81 MG EC tablet Take 81 mg by mouth daily.     atorvastatin (LIPITOR) 80 MG tablet Take 80 mg by mouth daily.    carvedilol (COREG) 12.5 MG tablet Take 12.5 mg by mouth 2 (two) times daily with a meal.    cetirizine (ZYRTEC) 10 MG tablet Take 10 mg by mouth daily.    clotrimazole-betamethasone (LOTRISONE) cream Apply 1 application topically 2 (two) times daily. Pt applies to breasts.    esomeprazole (NEXIUM) 20 MG capsule Take 20 mg by mouth daily at 12 noon.    Fluticasone-Salmeterol (ADVAIR) 250-50  MCG/DOSE AEPB Inhale 1 puff into the lungs 2 (two) times daily.    furosemide (LASIX) 40 MG tablet Take 40 mg by mouth daily.     lisinopril-hydrochlorothiazide (PRINZIDE,ZESTORETIC) 10-12.5 MG tablet Take 1 tablet by mouth daily.    Magnesium 250 MG TABS Take 250 mg by mouth 2 (two) times daily.    potassium chloride (K-DUR,KLOR-CON) 10  MEQ tablet Take 10 mEq by mouth 2 (two) times daily.    Tiotropium Bromide Monohydrate (SPIRIVA RESPIMAT) 2.5 MCG/ACT AERS Inhale 2 puffs into the lungs daily. Qty: 1 Inhaler, Refills: 11    traMADol (ULTRAM) 50 MG tablet Take 50 mg by mouth every 6 (six) hours as needed for moderate pain.    traZODone (DESYREL) 50 MG tablet Take 25-50 mg by mouth at bedtime as needed for sleep.       STOP taking these medications     predniSONE (STERAPRED UNI-PAK 21 TAB) 10 MG (21) TBPK tablet         If you experience worsening of your admission symptoms, develop shortness of breath, life threatening emergency, suicidal or homicidal thoughts you must seek medical attention immediately by calling 911 or calling your MD immediately  if symptoms less severe.  You Must read complete instructions/literature along with all the possible adverse reactions/side effects for all the Medicines you take and that have been prescribed to you. Take any new Medicines after you have completely understood and accept all the possible adverse reactions/side effects.   Please note  You were cared for by a hospitalist during your hospital stay. If you have any questions about your discharge medications or the care you received while you were in the hospital after you are discharged, you can call the unit and asked to speak with the hospitalist on call if the hospitalist that took care of you is not available. Once you are discharged, your primary care physician will handle any further medical issues. Please note that NO REFILLS for any discharge medications will be authorized once you are discharged, as it is imperative that you return to your primary care physician (or establish a relationship with a primary care physician if you do not have one) for your aftercare needs so that they can reassess your need for medications and monitor your lab values. Today   SUBJECTIVE   Tolerating po diet  VITAL SIGNS:  Blood pressure  128/61, pulse 81, temperature 98.2 F (36.8 C), temperature source Oral, resp. rate 24, height 5\' 1"  (1.549 m), weight 72.122 kg (159 lb), SpO2 95 %.  I/O:    Intake/Output Summary (Last 24 hours) at 12/18/15 1320 Last data filed at 12/18/15 1100  Gross per 24 hour  Intake 1641.97 ml  Output   2450 ml  Net -808.03 ml    PHYSICAL EXAMINATION:  GENERAL:  55 y.o.-year-old patient lying in the bed with no acute distress.  EYES: Pupils equal, round, reactive to light and accommodation. No scleral icterus. Extraocular muscles intact.  HEENT: Head atraumatic, normocephalic. Oropharynx and nasopharynx clear.  NECK:  Supple, no jugular venous distention. No thyroid enlargement, no tenderness.  LUNGS: Normal breath sounds bilaterally, no wheezing, rales,rhonchi or crepitation. No use of accessory muscles of respiration.  CARDIOVASCULAR: S1, S2 normal. No murmurs, rubs, or gallops.  ABDOMEN: Soft, non-tender, non-distended. Bowel sounds present. No organomegaly or mass.  EXTREMITIES: No pedal edema, cyanosis, or clubbing.  NEUROLOGIC: Cranial nerves II through XII are intact. Muscle strength 5/5 in all extremities. Sensation intact. Gait not checked.  PSYCHIATRIC: The patient is alert and oriented x 3.  SKIN: No obvious rash, lesion, or ulcer.   DATA REVIEW:   CBC   Recent Labs Lab 12/16/15 0529  WBC 6.1  HGB 14.1  HCT 42.3  PLT 205    Chemistries   Recent Labs Lab 12/15/15 1625 12/16/15 0529  NA 132* 134*  K 4.3 4.3  CL 96* 100*  CO2 26 27  GLUCOSE 108* 73  BUN 19 14  CREATININE 0.68 0.61  CALCIUM 9.1 8.9  AST 18  --   ALT 17  --   ALKPHOS 80  --   BILITOT 1.1  --     Microbiology Results   No results found for this or any previous visit (from the past 240 hour(s)).  RADIOLOGY:  Ct Lumbar Spine Wo Contrast  12/17/2015  CLINICAL DATA:  Status post fall, lumbar pain. EXAM: CT LUMBAR SPINE WITHOUT CONTRAST TECHNIQUE: Multidetector CT imaging of the lumbar spine  was performed without intravenous contrast administration. Multiplanar CT image reconstructions were also generated. COMPARISON:  None. FINDINGS: The alignment is anatomic.  There is no static listhesis. There is an anterior vertebral body compression fracture of the L1 vertebral body with approximately 15% height loss. There is 5 mm of retropulsion of the superior posterior margin of the L1 vertebral body with mild impression on the thecal sac. The remainder the vertebral body heights are maintained. The paravertebral soft tissues are normal. The intraspinal soft tissues are not fully imaged on this examination due to poor soft tissue contrast, but there is no gross soft tissue abnormality. The disc spaces are maintained. There is thoracic aortic atherosclerosis. There are bilateral common iliac artery stents. There is a superior mesenteric artery stents. IMPRESSION: Acute L1 vertebral body compression fracture with approximately 15% height loss. Electronically Signed   By: Kathreen Devoid   On: 12/17/2015 16:30   Dg Abd 2 Views  12/16/2015  CLINICAL DATA:  Lower abdominal pain for several months. EXAM: ABDOMEN - 2 VIEW COMPARISON:  None. FINDINGS: The lung bases are clear. The heart is enlarged. Coronary artery stents are noted along with surgical changes from bypass surgery. Bilateral iliac artery stents are noted. There is moderate scattered stool and air throughout the colon and down into the rectum. There are also air-filled loops of small bowel without distention or air-fluid levels. The bony structures are intact. IMPRESSION: No plain film findings for an acute abdominal process. Electronically Signed   By: Marijo Sanes M.D.   On: 12/16/2015 17:55     Management plans discussed with the patient, family and they are in agreement.  CODE STATUS:     Code Status Orders        Start     Ordered   12/15/15 2245  Full code   Continuous     12/15/15 2244    Code Status History    Date Active Date  Inactive Code Status Order ID Comments User Context   02/10/2015 12:02 PM 02/12/2015  7:12 PM Full Code [id number]  Algernon Huxley, MD Inpatient      TOTAL TIME TAKING CARE OF THIS PATIENT: 40 minutes.    [place] M.D on 12/18/2015 at 1:20 PM  Between 7am to 6pm - Pager - [phone] After 6pm go to [email or web address] - password [email or web address] [facility]  [facility]  Office  [phone]  CC: Primary care physician; Adrian Prows, MD

## 2015-12-20 ENCOUNTER — Encounter: Payer: Self-pay | Admitting: Vascular Surgery

## 2016-01-01 DIAGNOSIS — K559 Vascular disorder of intestine, unspecified: Secondary | ICD-10-CM

## 2016-01-01 HISTORY — DX: Vascular disorder of intestine, unspecified: K55.9

## 2016-01-03 ENCOUNTER — Other Ambulatory Visit
Admission: RE | Admit: 2016-01-03 | Discharge: 2016-01-03 | Disposition: A | Discharge status: Home / Self Care | Payer: BLUE CROSS/BLUE SHIELD | Source: Ambulatory Visit | Attending: Gastroenterology | Admitting: Gastroenterology

## 2016-01-03 DIAGNOSIS — R197 Diarrhea, unspecified: Secondary | ICD-10-CM | POA: Insufficient documentation

## 2016-01-03 LAB — GASTROINTESTINAL PANEL BY PCR, STOOL (REPLACES STOOL CULTURE)
ADENOVIRUS F40/41: NOT DETECTED
Astrovirus: NOT DETECTED
CRYPTOSPORIDIUM: NOT DETECTED
CYCLOSPORA CAYETANENSIS: NOT DETECTED
Campylobacter species: NOT DETECTED
E. coli O157: NOT DETECTED
ENTAMOEBA HISTOLYTICA: NOT DETECTED
ENTEROAGGREGATIVE E COLI (EAEC): NOT DETECTED
Enteropathogenic E coli (EPEC): NOT DETECTED
Enterotoxigenic E coli (ETEC): NOT DETECTED
GIARDIA LAMBLIA: NOT DETECTED
Norovirus GI/GII: NOT DETECTED
Plesimonas shigelloides: NOT DETECTED
Rotavirus A: NOT DETECTED
SALMONELLA SPECIES: NOT DETECTED
SHIGELLA/ENTEROINVASIVE E COLI (EIEC): NOT DETECTED
Sapovirus (I, II, IV, and V): NOT DETECTED
Shiga like toxin producing E coli (STEC): NOT DETECTED
VIBRIO CHOLERAE: NOT DETECTED
VIBRIO SPECIES: NOT DETECTED
YERSINIA ENTEROCOLITICA: NOT DETECTED

## 2016-01-20 ENCOUNTER — Emergency Department: Payer: BLUE CROSS/BLUE SHIELD

## 2016-01-20 ENCOUNTER — Observation Stay
Admission: EM | Admit: 2016-01-20 | Discharge: 2016-01-25 | Disposition: A | Discharge status: Home / Self Care | Payer: BLUE CROSS/BLUE SHIELD | Attending: Specialist | Admitting: Specialist

## 2016-01-20 ENCOUNTER — Encounter: Payer: Self-pay | Admitting: Emergency Medicine

## 2016-01-20 DIAGNOSIS — Z85828 Personal history of other malignant neoplasm of skin: Secondary | ICD-10-CM | POA: Insufficient documentation

## 2016-01-20 DIAGNOSIS — B3781 Candidal esophagitis: Secondary | ICD-10-CM | POA: Insufficient documentation

## 2016-01-20 DIAGNOSIS — I252 Old myocardial infarction: Secondary | ICD-10-CM | POA: Insufficient documentation

## 2016-01-20 DIAGNOSIS — Z951 Presence of aortocoronary bypass graft: Secondary | ICD-10-CM | POA: Insufficient documentation

## 2016-01-20 DIAGNOSIS — Z9889 Other specified postprocedural states: Secondary | ICD-10-CM | POA: Insufficient documentation

## 2016-01-20 DIAGNOSIS — R52 Pain, unspecified: Secondary | ICD-10-CM | POA: Diagnosis present

## 2016-01-20 DIAGNOSIS — I2510 Atherosclerotic heart disease of native coronary artery without angina pectoris: Secondary | ICD-10-CM | POA: Insufficient documentation

## 2016-01-20 DIAGNOSIS — I509 Heart failure, unspecified: Secondary | ICD-10-CM | POA: Insufficient documentation

## 2016-01-20 DIAGNOSIS — Z85820 Personal history of malignant melanoma of skin: Secondary | ICD-10-CM | POA: Diagnosis not present

## 2016-01-20 DIAGNOSIS — M542 Cervicalgia: Secondary | ICD-10-CM | POA: Insufficient documentation

## 2016-01-20 DIAGNOSIS — R1010 Upper abdominal pain, unspecified: Secondary | ICD-10-CM | POA: Insufficient documentation

## 2016-01-20 DIAGNOSIS — Z803 Family history of malignant neoplasm of breast: Secondary | ICD-10-CM | POA: Insufficient documentation

## 2016-01-20 DIAGNOSIS — G8929 Other chronic pain: Secondary | ICD-10-CM | POA: Diagnosis not present

## 2016-01-20 DIAGNOSIS — I739 Peripheral vascular disease, unspecified: Secondary | ICD-10-CM | POA: Diagnosis not present

## 2016-01-20 DIAGNOSIS — Z955 Presence of coronary angioplasty implant and graft: Secondary | ICD-10-CM | POA: Insufficient documentation

## 2016-01-20 DIAGNOSIS — E785 Hyperlipidemia, unspecified: Secondary | ICD-10-CM | POA: Diagnosis not present

## 2016-01-20 DIAGNOSIS — Z87891 Personal history of nicotine dependence: Secondary | ICD-10-CM | POA: Insufficient documentation

## 2016-01-20 DIAGNOSIS — M545 Low back pain: Secondary | ICD-10-CM | POA: Diagnosis not present

## 2016-01-20 DIAGNOSIS — R0602 Shortness of breath: Secondary | ICD-10-CM | POA: Insufficient documentation

## 2016-01-20 DIAGNOSIS — Z8673 Personal history of transient ischemic attack (TIA), and cerebral infarction without residual deficits: Secondary | ICD-10-CM | POA: Insufficient documentation

## 2016-01-20 DIAGNOSIS — R0781 Pleurodynia: Secondary | ICD-10-CM | POA: Diagnosis present

## 2016-01-20 DIAGNOSIS — K2950 Unspecified chronic gastritis without bleeding: Secondary | ICD-10-CM | POA: Insufficient documentation

## 2016-01-20 DIAGNOSIS — R112 Nausea with vomiting, unspecified: Secondary | ICD-10-CM | POA: Diagnosis not present

## 2016-01-20 DIAGNOSIS — F419 Anxiety disorder, unspecified: Secondary | ICD-10-CM | POA: Insufficient documentation

## 2016-01-20 DIAGNOSIS — Z765 Malingerer [conscious simulation]: Secondary | ICD-10-CM | POA: Diagnosis not present

## 2016-01-20 DIAGNOSIS — K921 Melena: Secondary | ICD-10-CM | POA: Diagnosis not present

## 2016-01-20 DIAGNOSIS — I10 Essential (primary) hypertension: Secondary | ICD-10-CM | POA: Insufficient documentation

## 2016-01-20 DIAGNOSIS — K551 Chronic vascular disorders of intestine: Secondary | ICD-10-CM | POA: Diagnosis not present

## 2016-01-20 DIAGNOSIS — R1084 Generalized abdominal pain: Secondary | ICD-10-CM | POA: Diagnosis not present

## 2016-01-20 DIAGNOSIS — Z79899 Other long term (current) drug therapy: Secondary | ICD-10-CM | POA: Insufficient documentation

## 2016-01-20 DIAGNOSIS — K5730 Diverticulosis of large intestine without perforation or abscess without bleeding: Secondary | ICD-10-CM | POA: Insufficient documentation

## 2016-01-20 DIAGNOSIS — M4856XA Collapsed vertebra, not elsewhere classified, lumbar region, initial encounter for fracture: Secondary | ICD-10-CM | POA: Insufficient documentation

## 2016-01-20 DIAGNOSIS — Z90710 Acquired absence of both cervix and uterus: Secondary | ICD-10-CM | POA: Diagnosis not present

## 2016-01-20 DIAGNOSIS — J449 Chronic obstructive pulmonary disease, unspecified: Secondary | ICD-10-CM | POA: Insufficient documentation

## 2016-01-20 DIAGNOSIS — I708 Atherosclerosis of other arteries: Secondary | ICD-10-CM | POA: Diagnosis not present

## 2016-01-20 DIAGNOSIS — R079 Chest pain, unspecified: Secondary | ICD-10-CM | POA: Diagnosis not present

## 2016-01-20 DIAGNOSIS — M549 Dorsalgia, unspecified: Secondary | ICD-10-CM

## 2016-01-20 DIAGNOSIS — K529 Noninfective gastroenteritis and colitis, unspecified: Secondary | ICD-10-CM | POA: Diagnosis not present

## 2016-01-20 DIAGNOSIS — K2970 Gastritis, unspecified, without bleeding: Secondary | ICD-10-CM | POA: Insufficient documentation

## 2016-01-20 DIAGNOSIS — Z7982 Long term (current) use of aspirin: Secondary | ICD-10-CM | POA: Insufficient documentation

## 2016-01-20 DIAGNOSIS — Z7951 Long term (current) use of inhaled steroids: Secondary | ICD-10-CM | POA: Insufficient documentation

## 2016-01-20 DIAGNOSIS — K228 Other specified diseases of esophagus: Secondary | ICD-10-CM | POA: Insufficient documentation

## 2016-01-20 DIAGNOSIS — I700 Atherosclerosis of aorta: Secondary | ICD-10-CM | POA: Insufficient documentation

## 2016-01-20 DIAGNOSIS — K210 Gastro-esophageal reflux disease with esophagitis: Secondary | ICD-10-CM | POA: Insufficient documentation

## 2016-01-20 LAB — BASIC METABOLIC PANEL
ANION GAP: 7 (ref 5–15)
BUN: 12 mg/dL (ref 6–20)
CHLORIDE: 105 mmol/L (ref 101–111)
CO2: 25 mmol/L (ref 22–32)
CREATININE: 0.62 mg/dL (ref 0.44–1.00)
Calcium: 9.5 mg/dL (ref 8.9–10.3)
GFR calc non Af Amer: >60 mL/min (ref >60)
Glucose, Bld: 94 mg/dL (ref 65–99)
POTASSIUM: 4.3 mmol/L (ref 3.5–5.1)
SODIUM: 137 mmol/L (ref 135–145)

## 2016-01-20 LAB — CBC
HCT: 44.5 % (ref 35.0–47.0)
HEMOGLOBIN: 14.7 g/dL (ref 12.0–16.0)
MCH: 32.6 pg (ref 26.0–34.0)
MCHC: 33.1 g/dL (ref 32.0–36.0)
MCV: 98.5 fL (ref 80.0–100.0)
PLATELETS: 264 10*3/uL (ref 150–440)
RBC: 4.52 MIL/uL (ref 3.80–5.20)
RDW: 15.6 % — ABNORMAL HIGH (ref 11.5–14.5)
WBC: 9.5 10*3/uL (ref 3.6–11.0)

## 2016-01-20 LAB — HEPATIC FUNCTION PANEL
ALT: 24 U/L (ref 14–54)
AST: 25 U/L (ref 15–41)
Albumin: 3.8 g/dL (ref 3.5–5.0)
Alkaline Phosphatase: 89 U/L (ref 38–126)
BILIRUBIN DIRECT: 0.3 mg/dL (ref 0.1–0.5)
BILIRUBIN INDIRECT: 1 mg/dL — AB (ref 0.3–0.9)
TOTAL PROTEIN: 6.3 g/dL — AB (ref 6.5–8.1)
Total Bilirubin: 1.3 mg/dL — ABNORMAL HIGH (ref 0.3–1.2)

## 2016-01-20 LAB — TROPONIN I: Troponin I: 0.03 ng/mL (ref <0.031)

## 2016-01-20 LAB — LIPASE, BLOOD: LIPASE: 25 U/L (ref 11–51)

## 2016-01-20 MED ORDER — HYDROMORPHONE HCL 1 MG/ML IJ SOLN
INTRAMUSCULAR | Status: AC
  Filled 2016-01-20: qty 1

## 2016-01-20 MED ORDER — IOPAMIDOL (ISOVUE-370) INJECTION 76%
100.0000 mL | Freq: Once | INTRAVENOUS | Status: AC | PRN
  Administered 2016-01-20: 100 mL via INTRAVENOUS

## 2016-01-20 MED ORDER — METOCLOPRAMIDE HCL 5 MG/ML IJ SOLN
10.0000 mg | Freq: Once | INTRAMUSCULAR | Status: AC
Start: 2016-01-20 — End: 2016-01-20
  Administered 2016-01-20: 10 mg via INTRAVENOUS

## 2016-01-20 MED ORDER — HYDROMORPHONE HCL 1 MG/ML IJ SOLN
1.0000 mg | Freq: Once | INTRAMUSCULAR | Status: AC
  Administered 2016-01-20: 1 mg via INTRAVENOUS

## 2016-01-20 MED ORDER — ONDANSETRON HCL 4 MG/2ML IJ SOLN
4.0000 mg | Freq: Once | INTRAMUSCULAR | Status: AC
  Administered 2016-01-20: 4 mg via INTRAVENOUS

## 2016-01-20 MED ORDER — ONDANSETRON HCL 4 MG/2ML IJ SOLN
INTRAMUSCULAR | Status: AC
Start: 2016-01-20 — End: 2016-01-21
  Filled 2016-01-20: qty 2

## 2016-01-20 MED ORDER — HYDROMORPHONE HCL 1 MG/ML IJ SOLN
1.0000 mg | Freq: Once | INTRAMUSCULAR | Status: AC
  Administered 2016-01-21: 1 mg via INTRAVENOUS

## 2016-01-20 MED ORDER — GI COCKTAIL ~~LOC~~
30.0000 mL | ORAL | Status: AC
  Administered 2016-01-20: 30 mL via ORAL

## 2016-01-20 NOTE — ED Notes (Signed)
MD Stafford at bedside. 

## 2016-01-20 NOTE — ED Notes (Signed)
PT reports she has not been taking her lasix for 4 months. Pt was not told to stop taking medication. Pt reports she stopped taking it because of how often she has to urinate when she goes out in public.

## 2016-01-20 NOTE — ED Provider Notes (Signed)
[facility], [place] Emergency Department Provider Note  ____________________________________________  Time seen: 6:40 PM  I have reviewed the triage vital signs and the nursing notes.   HISTORY  Chief Complaint Back Pain    HPI Kirsten Castro is a 55 y.o. female who complains of sharp chest pain in the center of the chest radiating to her back that hurts worse with breathing that started about one hour prior to arrival. He was given aspirin and nitroglycerin by EMS without relief. In his constant since then. Also has diffuse upper abdominal pain. This is associated with vomiting but no diarrhea. No hematemesis. No black stool. No dizziness or syncope.Pain is not worse with exertion.     Past Medical History  Diagnosis Date  . Stroke ([place])   . Coronary artery disease   . COPD (chronic obstructive pulmonary disease) ([place])   . Myocardial infarction ([place])   . Anginal pain ([place])   . Hypertension   . Cancer [facility])     melanoma skin cancer     Patient Active Problem List   Diagnosis Date Noted  . Colitis 12/15/2015  . GI bleed 12/15/2015  . Carotid stenosis 02/10/2015     Past Surgical History  Procedure Laterality Date  . Coronary artery bypass graft    . Peripheral vascular catheterization N/A 02/10/2015    Procedure: Carotid PTA/Stent Intervention;  Surgeon: Katha Cabal, MD;  Location: [facility];  Service: Cardiovascular;  Laterality: N/A;  . Stent placement vascular ([facility] hx)    . Peripheral vascular catheterization N/A 12/17/2015    Procedure: Visceral Angiography;  Surgeon: Katha Cabal, MD;  Location: [facility];  Service: Cardiovascular;  Laterality: N/A;  . Peripheral vascular catheterization N/A 12/17/2015    Procedure: Visceral Artery Intervention;  Surgeon: Katha Cabal, MD;  Location: [facility];  Service: Cardiovascular;  Laterality: N/A;  . Abdominal hysterectomy    . Other surgical history   Jul 11 2014    sternum removal  . Nose surgery      cancer removal     Current Outpatient Rx  Name  Route  Sig  Dispense  Refill  . acetaminophen (TYLENOL) 500 MG tablet   Oral   Take 1,000 mg by mouth every 6 (six) hours as needed for mild pain or headache.         . ALPRAZolam (XANAX) 1 MG tablet   Oral   Take 1 mg by mouth 3 (three) times daily.         Marland Kitchen aspirin 81 MG EC tablet   Oral   Take 81 mg by mouth daily.          Marland Kitchen atorvastatin (LIPITOR) 80 MG tablet   Oral   Take 80 mg by mouth daily.         . carvedilol (COREG) 12.5 MG tablet   Oral   Take 12.5 mg by mouth 2 (two) times daily with a meal.         . cetirizine (ZYRTEC) 10 MG tablet   Oral   Take 10 mg by mouth daily.         . clotrimazole-betamethasone (LOTRISONE) cream   Topical   Apply 1 application topically 2 (two) times daily. Pt applies to breasts.         Marland Kitchen esomeprazole (NEXIUM) 20 MG capsule   Oral   Take 20 mg by mouth daily at 12 noon.         Marland Kitchen  Fluticasone-Salmeterol (ADVAIR) 250-50 MCG/DOSE AEPB   Inhalation   Inhale 1 puff into the lungs 2 (two) times daily.         . furosemide (LASIX) 40 MG tablet   Oral   Take 40 mg by mouth daily.          Marland Kitchen lisinopril-hydrochlorothiazide (PRINZIDE,ZESTORETIC) 10-12.5 MG tablet   Oral   Take 1 tablet by mouth daily.         . Magnesium 250 MG TABS   Oral   Take 250 mg by mouth 2 (two) times daily.         Marland Kitchen oxyCODONE-acetaminophen (PERCOCET/ROXICET) 5-325 MG tablet   Oral   Take 1 tablet by mouth every 6 (six) hours as needed for moderate pain or severe pain.   30 tablet   0   . potassium chloride (K-DUR,KLOR-CON) 10 MEQ tablet   Oral   Take 10 mEq by mouth 2 (two) times daily.         . Tiotropium Bromide Monohydrate (SPIRIVA RESPIMAT) 2.5 MCG/ACT AERS   Inhalation   Inhale 2 puffs into the lungs daily.   1 Inhaler   11   . traMADol (ULTRAM) 50 MG tablet   Oral   Take 50 mg by mouth every 6 (six)  hours as needed for moderate pain.         . traZODone (DESYREL) 50 MG tablet   Oral   Take 25-50 mg by mouth at bedtime as needed for sleep.             Allergies Ferrous sulfate   Family History  Problem Relation Age of Onset  . Breast cancer Sister     Social History Social History  Substance Use Topics  . Smoking status: Former Research scientist (life sciences)  . Smokeless tobacco: None  . Alcohol Use: No    Review of Systems  Constitutional:   No fever or chills.  Eyes:   No vision changes.  ENT:   No sore throat. No rhinorrhea. Cardiovascular:   Positive as above chest pain. Respiratory:   Positive shortness of breath, no cough. Gastrointestinal:   Positive upper abdominal pain with vomiting..  No bloody stool. Genitourinary:   Negative for dysuria or difficulty urinating. Musculoskeletal:   Negative for focal pain or swelling Neurological:   Negative for headaches 10-point ROS otherwise negative.  ____________________________________________   PHYSICAL EXAM:  VITAL SIGNS: ED Triage Vitals  Enc Vitals Group     BP 01/20/16 1837 156/80 mmHg     Pulse Rate 01/20/16 1837 75     Resp 01/20/16 1837 29     Temp 01/20/16 1837 98.1 F (36.7 C)     Temp Source 01/20/16 1837 Oral     SpO2 01/20/16 1837 99 %     Weight 01/20/16 1837 159 lb (72.122 kg)     Height 01/20/16 1837 5\' 1"  (1.549 m)     Head Cir --      Peak Flow --      Pain Score 01/20/16 1839 10     Pain Loc --      Pain Edu? --      Excl. in [place]? --     Vital signs reviewed, nursing assessments reviewed.   Constitutional:   Alert and oriented. Well appearing and in no distress. Eyes:   No scleral icterus. No conjunctival pallor. PERRL. EOMI ENT   Head:   Normocephalic and atraumatic.   Nose:   No congestion/rhinnorhea. No septal hematoma  Mouth/Throat:   MMM, no pharyngeal erythema. No peritonsillar mass.    Neck:   No stridor. No SubQ emphysema. No meningismus.No  JVD Hematological/Lymphatic/Immunilogical:   No cervical lymphadenopathy. Cardiovascular:   RRR. Symmetric bilateral radial and DP pulses.  No murmurs.  Respiratory:   Normal work of breathing, mild tachypnea.. Breath sounds are clear and equal bilaterally. No wheezes/rales/rhonchi.No wheezing with forceful expiration Gastrointestinal:   Soft with mild epigastric tenderness. Non distended. There is no CVA tenderness.  No rebound, rigidity, or guarding. Genitourinary:   deferred Musculoskeletal:   Nontender with normal range of motion in all extremities. No joint effusions.  No lower extremity tenderness.  No edema.Chest wall deformity from previous surgeries and sternal osteomyelitis. Neurologic:   Normal speech and language.  CN 2-10 normal. Motor grossly intact. No gross focal neurologic deficits are appreciated.  Skin:    Skin is warm, dry and intact. No rash noted.  No petechiae, purpura, or bullae.  ____________________________________________    LABS (pertinent positives/negatives) (all labs ordered are listed, but only abnormal results are displayed) Labs Reviewed  CBC - Abnormal; Notable for the following:    RDW 15.6 (*)    All other components within normal limits  BASIC METABOLIC PANEL  TROPONIN I   ____________________________________________   EKG  Interpreted by me Sinus rhythm rate of 75, normal axis and intervals. Normal QRS ST segments and T waves.  ____________________________________________    RADIOLOGY  Chest x-ray unremarkable CT angiogram of chest abdomen pelvis pending  ____________________________________________   PROCEDURES   ____________________________________________   INITIAL IMPRESSION / ASSESSMENT AND PLAN / ED COURSE  Pertinent labs & imaging results that were available during my care of the patient were reviewed by me and considered in my medical decision making (see chart for details).  Patient presents with pleuritic chest  pain and shortness of breath. History of multiple surgeries as well as melanoma, elevated risk for PE. We'll CT angiogram of the chest to evaluate for PE. Additionally with her radiation to the back and abdomen with vomiting, she warrants further angiogram of the aorta to evaluate for dissection versus mesenteric ischemia. Low suspicion for perforation obstruction or cholecystitis. We'll check labs as well.  ----------------------------------------- 10:02 PM on 01/20/2016 -----------------------------------------  CT angiogram complete, reports pending. We'll need a repeat troponin at 11:00 PM as well. Case signed out to Dr. Clearnce Hasten to follow up on CT scans and labs, disposition pending.  If entire workup does not reveal significant abnormalities, I think the patient can be discharged home with aggressive treatment for GERD and close follow-up with primary care and cardiology..     ____________________________________________   FINAL CLINICAL IMPRESSION(S) / ED DIAGNOSES  Final diagnoses:  Pleuritic chest pain  Epigastric pain     Portions of this note were generated with dragon dictation software. Dictation errors may occur despite best attempts at proofreading.   Carrie Mew, MD 01/20/16 2204

## 2016-01-20 NOTE — ED Notes (Signed)
Pt reports she has been vomiting X 1 week. Vomiting is brought on by eating

## 2016-01-20 NOTE — ED Notes (Signed)
Patient transported to CT 

## 2016-01-20 NOTE — ED Notes (Signed)
Pt to ED via EMS from home c/o 10/10 pain between shoulder blades that started today about 45 min PTA.  Per EMS hx of HTN, triple bypass surgery, back problems, and no sternum, former smoker.  EMS vitals NSR, 101 CBG, 98.2, 180/81.  EMS administered 324 ASA and 1 nitro SL.  Pt presents A&Ox4, speaking in complete and coherent sentences.

## 2016-01-20 NOTE — ED Notes (Signed)
MD Brown at bedside.

## 2016-01-21 ENCOUNTER — Encounter: Payer: Self-pay | Admitting: Internal Medicine

## 2016-01-21 DIAGNOSIS — K529 Noninfective gastroenteritis and colitis, unspecified: Secondary | ICD-10-CM | POA: Diagnosis not present

## 2016-01-21 DIAGNOSIS — R112 Nausea with vomiting, unspecified: Secondary | ICD-10-CM | POA: Diagnosis not present

## 2016-01-21 DIAGNOSIS — R1010 Upper abdominal pain, unspecified: Secondary | ICD-10-CM | POA: Insufficient documentation

## 2016-01-21 DIAGNOSIS — B3781 Candidal esophagitis: Secondary | ICD-10-CM | POA: Diagnosis not present

## 2016-01-21 DIAGNOSIS — R110 Nausea: Secondary | ICD-10-CM

## 2016-01-21 DIAGNOSIS — K2970 Gastritis, unspecified, without bleeding: Secondary | ICD-10-CM | POA: Diagnosis not present

## 2016-01-21 DIAGNOSIS — R52 Pain, unspecified: Secondary | ICD-10-CM | POA: Diagnosis present

## 2016-01-21 DIAGNOSIS — R1084 Generalized abdominal pain: Secondary | ICD-10-CM | POA: Diagnosis not present

## 2016-01-21 LAB — TSH: TSH: 0.271 u[IU]/mL — ABNORMAL LOW (ref 0.350–4.500)

## 2016-01-21 LAB — ROTAVIRUS ANTIGEN, STOOL: Rotavirus: NEGATIVE

## 2016-01-21 LAB — TROPONIN I
Troponin I: 0.03 ng/mL (ref <0.031)
Troponin I: <0.03 ng/mL (ref <0.031)

## 2016-01-21 LAB — HEMOGLOBIN A1C: Hgb A1c MFr Bld: 5.7 % (ref 4.0–6.0)

## 2016-01-21 MED ORDER — CARVEDILOL 12.5 MG PO TABS
12.5000 mg | ORAL_TABLET | Freq: Two times a day (BID) | ORAL | Status: DC
  Administered 2016-01-21 – 2016-01-25 (×9): 12.5 mg via ORAL
  Filled 2016-01-21 (×9): qty 1

## 2016-01-21 MED ORDER — PANTOPRAZOLE SODIUM 40 MG PO TBEC
40.0000 mg | DELAYED_RELEASE_TABLET | Freq: Every day | ORAL | Status: DC
  Administered 2016-01-21 – 2016-01-25 (×5): 40 mg via ORAL
  Filled 2016-01-21 (×5): qty 1

## 2016-01-21 MED ORDER — CALCIUM CARBONATE ANTACID 500 MG PO CHEW
1.0000 | CHEWABLE_TABLET | Freq: Three times a day (TID) | ORAL | Status: DC
  Administered 2016-01-22 – 2016-01-25 (×5): 200 mg via ORAL
  Filled 2016-01-21 (×6): qty 1

## 2016-01-21 MED ORDER — ALPRAZOLAM 1 MG PO TABS: 1.0000 mg | ORAL_TABLET | Freq: Three times a day (TID) | ORAL | Status: DC

## 2016-01-21 MED ORDER — SODIUM CHLORIDE 0.9 % IV SOLN
INTRAVENOUS | Status: DC
  Administered 2016-01-21 – 2016-01-24 (×7): via INTRAVENOUS
  Administered 2016-01-24: 1000 mL via INTRAVENOUS

## 2016-01-21 MED ORDER — ENOXAPARIN SODIUM 40 MG/0.4ML ~~LOC~~ SOLN
40.0000 mg | SUBCUTANEOUS | Status: DC
  Administered 2016-01-22 – 2016-01-23 (×2): 40 mg via SUBCUTANEOUS
  Filled 2016-01-21 (×3): qty 0.4

## 2016-01-21 MED ORDER — NITROGLYCERIN 0.4 MG SL SUBL: 0.4000 mg | SUBLINGUAL_TABLET | SUBLINGUAL | Status: DC | PRN

## 2016-01-21 MED ORDER — TRAZODONE HCL 50 MG PO TABS
25.0000 mg | ORAL_TABLET | Freq: Every evening | ORAL | Status: DC | PRN
  Administered 2016-01-21 – 2016-01-25 (×5): 50 mg via ORAL
  Filled 2016-01-21 (×4): qty 1
  Filled 2016-01-21: qty 2

## 2016-01-21 MED ORDER — MOMETASONE FURO-FORMOTEROL FUM 200-5 MCG/ACT IN AERO
2.0000 | INHALATION_SPRAY | Freq: Two times a day (BID) | RESPIRATORY_TRACT | Status: DC
  Administered 2016-01-21 – 2016-01-25 (×9): 2 via RESPIRATORY_TRACT
  Filled 2016-01-21: qty 8.8

## 2016-01-21 MED ORDER — CETYLPYRIDINIUM CHLORIDE 0.05 % MT LIQD
7.0000 mL | Freq: Two times a day (BID) | OROMUCOSAL | Status: DC
  Administered 2016-01-21 – 2016-01-25 (×7): 7 mL via OROMUCOSAL

## 2016-01-21 MED ORDER — ACETAMINOPHEN 650 MG RE SUPP: 650.0000 mg | Freq: Four times a day (QID) | RECTAL | Status: DC | PRN

## 2016-01-21 MED ORDER — ASPIRIN EC 81 MG PO TBEC
81.0000 mg | DELAYED_RELEASE_TABLET | Freq: Every day | ORAL | Status: DC
  Administered 2016-01-21 – 2016-01-25 (×5): 81 mg via ORAL
  Filled 2016-01-21 (×5): qty 1

## 2016-01-21 MED ORDER — OXYCODONE HCL 5 MG PO TABS
5.0000 mg | ORAL_TABLET | ORAL | Status: DC | PRN
  Administered 2016-01-21 – 2016-01-22 (×3): 5 mg via ORAL
  Filled 2016-01-21 (×4): qty 1

## 2016-01-21 MED ORDER — ONDANSETRON HCL 4 MG PO TABS: 4.0000 mg | ORAL_TABLET | Freq: Four times a day (QID) | ORAL | Status: DC | PRN

## 2016-01-21 MED ORDER — CLOTRIMAZOLE 1 % EX CREA
TOPICAL_CREAM | Freq: Two times a day (BID) | CUTANEOUS | Status: DC
  Administered 2016-01-21 – 2016-01-23 (×2): via TOPICAL
  Filled 2016-01-21: qty 15

## 2016-01-21 MED ORDER — MAGNESIUM OXIDE 400 (241.3 MG) MG PO TABS
200.0000 mg | ORAL_TABLET | Freq: Every day | ORAL | Status: DC
Start: 2016-01-21 — End: 2016-01-25
  Administered 2016-01-21 – 2016-01-25 (×5): 200 mg via ORAL
  Filled 2016-01-21 (×5): qty 1

## 2016-01-21 MED ORDER — TRAMADOL HCL 50 MG PO TABS
50.0000 mg | ORAL_TABLET | Freq: Four times a day (QID) | ORAL | Status: DC | PRN
  Administered 2016-01-23: 50 mg via ORAL
  Filled 2016-01-21 (×2): qty 1

## 2016-01-21 MED ORDER — POTASSIUM CHLORIDE CRYS ER 10 MEQ PO TBCR
10.0000 meq | EXTENDED_RELEASE_TABLET | Freq: Two times a day (BID) | ORAL | Status: DC
  Administered 2016-01-21 – 2016-01-25 (×9): 10 meq via ORAL
  Filled 2016-01-21 (×9): qty 1

## 2016-01-21 MED ORDER — ONDANSETRON HCL 4 MG/2ML IJ SOLN
4.0000 mg | Freq: Four times a day (QID) | INTRAMUSCULAR | Status: DC | PRN
  Administered 2016-01-21 (×3): 4 mg via INTRAVENOUS
  Filled 2016-01-21 (×4): qty 2

## 2016-01-21 MED ORDER — LORATADINE 10 MG PO TABS
10.0000 mg | ORAL_TABLET | Freq: Every day | ORAL | Status: DC
  Administered 2016-01-21 – 2016-01-25 (×5): 10 mg via ORAL
  Filled 2016-01-21 (×5): qty 1

## 2016-01-21 MED ORDER — ALPRAZOLAM 1 MG PO TABS
1.0000 mg | ORAL_TABLET | Freq: Three times a day (TID) | ORAL | Status: DC | PRN
  Administered 2016-01-21 – 2016-01-25 (×10): 1 mg via ORAL
  Filled 2016-01-21 (×10): qty 1

## 2016-01-21 MED ORDER — ACETAMINOPHEN 325 MG PO TABS: 650.0000 mg | ORAL_TABLET | Freq: Four times a day (QID) | ORAL | Status: DC | PRN

## 2016-01-21 MED ORDER — TIOTROPIUM BROMIDE MONOHYDRATE 18 MCG IN CAPS
1.0000 | ORAL_CAPSULE | Freq: Every day | RESPIRATORY_TRACT | Status: DC
  Administered 2016-01-21 – 2016-01-25 (×5): 18 ug via RESPIRATORY_TRACT
  Filled 2016-01-21: qty 5

## 2016-01-21 MED ORDER — ACETAMINOPHEN 500 MG PO TABS: 1000.0000 mg | ORAL_TABLET | Freq: Four times a day (QID) | ORAL | Status: DC | PRN

## 2016-01-21 MED ORDER — HYDROMORPHONE HCL 1 MG/ML IJ SOLN
0.5000 mg | INTRAMUSCULAR | Status: DC | PRN
  Administered 2016-01-21 – 2016-01-24 (×7): 0.5 mg via INTRAVENOUS
  Filled 2016-01-21 (×7): qty 1

## 2016-01-21 MED ORDER — MAGNESIUM 250 MG PO TABS: 250.0000 mg | ORAL_TABLET | Freq: Two times a day (BID) | ORAL | Status: DC

## 2016-01-21 MED ORDER — DOCUSATE SODIUM 100 MG PO CAPS
100.0000 mg | ORAL_CAPSULE | Freq: Two times a day (BID) | ORAL | Status: DC
  Administered 2016-01-23 – 2016-01-25 (×2): 100 mg via ORAL
  Filled 2016-01-21 (×8): qty 1

## 2016-01-21 MED ORDER — ATORVASTATIN CALCIUM 20 MG PO TABS
80.0000 mg | ORAL_TABLET | Freq: Every day | ORAL | Status: DC
  Administered 2016-01-21 – 2016-01-25 (×5): 80 mg via ORAL
  Filled 2016-01-21 (×5): qty 4

## 2016-01-21 NOTE — ED Notes (Signed)
Pt's arm swollen and warm to touch at site of extravasation. Ice placed on pt's left arm at site of extravasation. Pt's arm elevated.

## 2016-01-21 NOTE — Progress Notes (Signed)
Initial Nutrition Assessment     INTERVENTION:  Monitor intake and diet progression when medically able    NUTRITION DIAGNOSIS:   Inadequate oral intake related to altered GI function as evidenced by per patient/family report.    GOAL:   Patient will meet greater than or equal to 90% of their needs    MONITOR:   PO intake, Diet advancement  REASON FOR ASSESSMENT:   Malnutrition Screening Tool    ASSESSMENT:   55 y/o female admitted with compression fracture and pain, nausea, vomiting, diarrhea. Past Medical History  Diagnosis Date  . Stroke ([place])   . Coronary artery disease   . COPD (chronic obstructive pulmonary disease) ([place])   . Myocardial infarction ([place])   . Anginal pain ([place])   . Hypertension   . Cancer ([place])     melanoma skin cancer   Pt reports for the last week will eat but then vomits right after eating, intake has been down as well.  Tolerated jello this am and few sips of broth.    Medications reviewed tums, colace, mag ox, protonix, KCL, NS at 149ml/hr Labs reviewed  Nutrition-Focused physical exam completed. Findings are WDL for fat depletion, muscle depletion, and edema.    Diet Order:  Diet clear liquid Room service appropriate?: Yes; Fluid consistency:: Thin  Skin:  Reviewed, no issues  Last BM:  4/20  Height:   Ht Readings from Last 1 Encounters:  01/21/16 5\' 1"  (1.549 m)    Weight: Pt reports 4% wt loss in the last 2 months   Wt Readings from Last 1 Encounters:  01/21/16 154 lb 11.2 oz (70.171 kg)    Ideal Body Weight:     BMI:  Body mass index is 29.25 kg/(m^2).  Estimated Nutritional Needs:   Kcal:  1750-2100 kcals/d.   Protein:  70-84 g/d  Fluid:  1.7-2 L/d  EDUCATION NEEDS:   No education needs identified at this time  Oniya Mandarino B. Zenia Resides, [place], [place] (pager) Weekend/On-Call pager [phone])

## 2016-01-21 NOTE — H&P (Addendum)
Kirsten Castro is an 55 y.o. female.   Chief Complaint: Abdominal pain HPI: The patient presents to the emergency department complaining of abdominal pain as well as nausea, vomiting and diarrhea. She states that all the symptoms have been present for more than a month. She reports that she vomits after nearly every meal and that her stool is sometimes like water. The patient had been scheduled for an upper endoscopy at the beginning of March but could not undergo the procedure due to some episodes of hypotension. Tonight she also complains of some intermittent chest pain which she cannot tell is from her chest or radiating from her abdomen or back. The patient underwent a CTA of the chest abdomen and pelvis which revealed atherosclerosis of her abdominal arteries as well as coronary arteries, all of which were patent. The scan also revealed a worsening of a compression fracture. Despite multiple doses of pain medication the patient did not have any relief, so the emergency department staff called the hospitalist service for further treatment.  Past Medical History  Diagnosis Date  . Stroke ([place])   . Coronary artery disease   . COPD (chronic obstructive pulmonary disease) ([place])   . Myocardial infarction ([place])   . Anginal pain ([place])   . Hypertension   . Cancer [facility])     melanoma skin cancer    Past Surgical History  Procedure Laterality Date  . Coronary artery bypass graft    . Peripheral vascular catheterization N/A 02/10/2015    Procedure: Carotid PTA/Stent Intervention;  Surgeon: Katha Cabal, MD;  Location: [place];  Service: Cardiovascular;  Laterality: N/A;  . Stent placement vascular ([facility] hx)    . Peripheral vascular catheterization N/A 12/17/2015    Procedure: Visceral Angiography;  Surgeon: Katha Cabal, MD;  Location: [place] [facility];  Service: Cardiovascular;  Laterality: N/A;  . Peripheral vascular catheterization N/A 12/17/2015    Procedure:  Visceral Artery Intervention;  Surgeon: Katha Cabal, MD;  Location: [place] [facility];  Service: Cardiovascular;  Laterality: N/A;  . Abdominal hysterectomy    . Other surgical history  Jul 11 2014    sternum removal  . Nose surgery      cancer removal    Family History  Problem Relation Age of Onset  . Breast cancer Sister    Social History:  reports that she has quit smoking. She does not have any smokeless tobacco history on file. She reports that she does not drink alcohol or use illicit drugs.  Allergies:  Allergies  Allergen Reactions  . Ferrous Sulfate Hives    Medications Prior to Admission  Medication Sig Dispense Refill  . acetaminophen (TYLENOL) 500 MG tablet Take 1,000 mg by mouth every 6 (six) hours as needed for mild pain or headache.    . ALPRAZolam (XANAX) 1 MG tablet Take 1 mg by mouth 3 (three) times daily.    Marland Kitchen aspirin 81 MG EC tablet Take 81 mg by mouth daily.     Marland Kitchen atorvastatin (LIPITOR) 80 MG tablet Take 80 mg by mouth daily.    . carvedilol (COREG) 12.5 MG tablet Take 12.5 mg by mouth 2 (two) times daily with a meal.    . cetirizine (ZYRTEC) 10 MG tablet Take 10 mg by mouth daily.    Marland Kitchen esomeprazole (NEXIUM) 20 MG capsule Take 20 mg by mouth daily at 12 noon.    . Fluticasone-Salmeterol (ADVAIR) 250-50 MCG/DOSE AEPB Inhale 1 puff into the lungs  2 (two) times daily.    . Magnesium 250 MG TABS Take 250 mg by mouth 2 (two) times daily.    . potassium chloride (K-DUR,KLOR-CON) 10 MEQ tablet Take 10 mEq by mouth 2 (two) times daily.    . traMADol (ULTRAM) 50 MG tablet Take 50 mg by mouth every 6 (six) hours as needed for moderate pain.    . traZODone (DESYREL) 50 MG tablet Take 50 mg by mouth at bedtime as needed for sleep.       Results for orders placed or performed during the hospital encounter of 01/20/16 (from the past 48 hour(s))  Basic metabolic panel     Status: None   Collection Time: 01/20/16  7:53 PM  Result Value Ref Range   Sodium 137 135  - 145 mmol/L   Potassium 4.3 3.5 - 5.1 mmol/L    Comment: HEMOLYSIS AT THIS LEVEL MAY AFFECT RESULT   Chloride 105 101 - 111 mmol/L   CO2 25 22 - 32 mmol/L   Glucose, Bld 94 65 - 99 mg/dL   BUN 12 6 - 20 mg/dL   Creatinine, Ser 0.62 0.44 - 1.00 mg/dL   Calcium 9.5 8.9 - 10.3 mg/dL   GFR calc non Af Amer >60 >60 mL/min   GFR calc Af Amer >60 >60 mL/min    Comment: (NOTE) The eGFR has been calculated using the CKD EPI equation. This calculation has not been validated in all clinical situations. eGFR's persistently <60 mL/min signify possible Chronic Kidney Disease.    Anion gap 7 5 - 15  CBC     Status: Abnormal   Collection Time: 01/20/16  7:53 PM  Result Value Ref Range   WBC 9.5 3.6 - 11.0 K/uL   RBC 4.52 3.80 - 5.20 MIL/uL   Hemoglobin 14.7 12.0 - 16.0 g/dL   HCT 44.5 35.0 - 47.0 %   MCV 98.5 80.0 - 100.0 fL   MCH 32.6 26.0 - 34.0 pg   MCHC 33.1 32.0 - 36.0 g/dL   RDW 15.6 (H) 11.5 - 14.5 %   Platelets 264 150 - 440 K/uL  Troponin I     Status: None   Collection Time: 01/20/16  7:53 PM  Result Value Ref Range   Troponin I 0.03 <0.031 ng/mL    Comment:        NO INDICATION OF MYOCARDIAL INJURY.   Hepatic function panel     Status: Abnormal   Collection Time: 01/20/16  7:53 PM  Result Value Ref Range   Total Protein 6.3 (L) 6.5 - 8.1 g/dL   Albumin 3.8 3.5 - 5.0 g/dL   AST 25 15 - 41 U/L   ALT 24 14 - 54 U/L   Alkaline Phosphatase 89 38 - 126 U/L   Total Bilirubin 1.3 (H) 0.3 - 1.2 mg/dL   Bilirubin, Direct 0.3 0.1 - 0.5 mg/dL   Indirect Bilirubin 1.0 (H) 0.3 - 0.9 mg/dL  Lipase, blood     Status: None   Collection Time: 01/20/16  7:53 PM  Result Value Ref Range   Lipase 25 11 - 51 U/L  Troponin I     Status: None   Collection Time: 01/20/16 11:00 PM  Result Value Ref Range   Troponin I 0.03 <0.031 ng/mL    Comment:        NO INDICATION OF MYOCARDIAL INJURY.    Dg Chest 2 View  01/20/2016  CLINICAL DATA:  Acute onset of chest pain, shortness of breath  and nausea onset 1 hour prior. EXAM: CHEST  2 VIEW COMPARISON:  Radiographs 12/15/2015, 11/20/2015 FINDINGS: Patient is post CABG with coronary stent. Great vessels stent also unchanged. Cardiomediastinal contours are unchanged, mild cardiomegaly. No pulmonary edema, confluent airspace disease, pleural effusion or pneumothorax. Sternum is absent. No acute osseous abnormality. IMPRESSION: No acute process or change from prior exams. Electronically Signed   By: Jeb Levering M.D.   On: 01/20/2016 19:14   Ct Angio Chest Pe W/cm &/or Wo Cm  01/20/2016  CLINICAL DATA:  Pt from home c/o 10/10 pain between shoulder blades that started today about 45 min PTA. Pt. Reports vomiting x 1week, hx of HTN, triple bypass surgery, back problems, and no sternum, former smoker, and hyster., 150 ml isovue 370 given-approx. 50 ml infiltrated into left forearm, Dr. Gerilyn Nestle consulted and ER MD, Dr. Joni Fears aware EXAM: CT ANGIOGRAPHY CHEST, ABDOMEN AND PELVIS TECHNIQUE: Multidetector CT imaging through the chest, abdomen and pelvis was performed using the standard protocol during bolus administration of intravenous contrast. Multiplanar reconstructed images and MIPs were obtained and reviewed to evaluate the vascular anatomy. CT images of the chest were optimized for evaluation of pulmonary arteries. Additional portal venous phase images of the abdomen and pelvis were also obtained. CONTRAST:  150 mL Isovue 370 COMPARISON:  CT abdomen and pelvis 12/07/2015 FINDINGS: CTA CHEST FINDINGS Technically adequate study with good opacification of the central and segmental pulmonary arteries. No focal filling defects. No evidence of significant pulmonary embolus. Postoperative changes in the mediastinum with absence of the sternum and bypass grafting changes. Mild cardiac enlargement with predominant right heart enlargement. There is reflux of contrast material into the IVC and hepatic veins suggesting passive congestion due to right heart  failure. Coronary artery calcifications. Calcifications in the thoracic aorta. Aorta and great vessels are patent. Prominent atherosclerotic calcification in the aorta and great vessel origins. Esophagus is decompressed. No significant lymphadenopathy in the chest. Evaluation of lungs is limited due to respiratory motion artifact. There are scattered emphysematous changes in the lungs. Dependent atelectasis in the lung bases. No definite airspace disease or consolidation. No pneumothorax. No pleural effusion. Review of the MIP images confirms the above findings. CTA ABDOMEN AND PELVIS FINDINGS Images obtained during arterial phase of contrast bolus administration demonstrated normal caliber abdominal aorta. Prominent calcific and noncalcific atherosclerotic changes throughout the abdominal aorta. Probable high-grade stenosis of the origins of the iliac arteries bilaterally. The abdominal aorta, celiac axis, superior mesenteric artery, single bilateral renal arteries, and bilateral iliac, external iliac, and internal iliac arteries are patent. The common femoral and proximal superficial and deep femoral arteries are patent. There appears to be a wall stabbed in the origin of the superior mesenteric artery. Stent is present in the right external iliac artery with partial thrombosis in the stent. Probable high-grade stenosis at the origin of the external iliac artery. Renal nephrograms are symmetrical. Prominent calcification at the origins of the renal arteries. Portal venous phase images demonstrate normal flow to the portal and mesenteric veins. Liver parenchymal echotexture is somewhat heterogeneous likely related to passive congestion. No focal liver lesions are identified. The gallbladder, pancreas, spleen, adrenal glands, kidneys, inferior vena cava, and retroperitoneal lymph nodes are unremarkable. Stomach, small bowel, and colon are not abnormally distended. Scattered stool in the colon. No free air or free  fluid in the abdomen. Pelvis: Scattered diverticula in the sigmoid colon without evidence of diverticulitis. Appendix is not identified. Bladder wall is not thickened. Uterus is surgically absent. No free  or loculated pelvic fluid collections. No pelvic mass or lymphadenopathy. Anterior compression of the L1 vertebra with sclerosis of the superior endplate and mild retropulsion of fracture fragments. There is about 40% loss of height anteriorly. There is progression since previous CT lumbar spine from 12/17/2015. Review of the MIP images confirms the above findings. IMPRESSION: No evidence of significant pulmonary embolus. No evidence of active pulmonary disease. 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 Extensive atherosclerotic changes throughout the abdominal aorta and mesenteric vessels although vessels remain patent. Multiple focal areas of stenosis are demonstrated. Previous stents in the superior mesenteric artery origin and in the right external iliac artery. Progression of the old compression fracture of the L1 vertebra. Electronically Signed   By: Lucienne Capers M.D.   On: 01/20/2016 22:19   Ct Cta Abd/pel W/cm &/or W/o Cm  01/20/2016  CLINICAL DATA:  Pt from home c/o 10/10 pain between shoulder blades that started today about 45 min PTA. Pt. Reports vomiting x 1week, hx of HTN, triple bypass surgery, back problems, and no sternum, former smoker, and hyster., 150 ml isovue 370 given-approx. 50 ml infiltrated into left forearm, Dr. Gerilyn Nestle consulted and ER MD, Dr. Joni Fears aware EXAM: CT ANGIOGRAPHY CHEST, ABDOMEN AND PELVIS TECHNIQUE: Multidetector CT imaging through the chest, abdomen and pelvis was performed using the standard protocol during bolus administration of intravenous contrast. Multiplanar reconstructed images and MIPs were obtained and reviewed to evaluate the vascular anatomy. CT images of the chest were optimized for evaluation of  pulmonary arteries. Additional portal venous phase images of the abdomen and pelvis were also obtained. CONTRAST:  150 mL Isovue 370 COMPARISON:  CT abdomen and pelvis 12/07/2015 FINDINGS: CTA CHEST FINDINGS Technically adequate study with good opacification of the central and segmental pulmonary arteries. No focal filling defects. No evidence of significant pulmonary embolus. Postoperative changes in the mediastinum with absence of the sternum and bypass grafting changes. Mild cardiac enlargement with predominant right heart enlargement. There is reflux of contrast material into the IVC and hepatic veins suggesting passive congestion due to right heart failure. Coronary artery calcifications. Calcifications in the thoracic aorta. Aorta and great vessels are patent. Prominent atherosclerotic calcification in the aorta and great vessel origins. Esophagus is decompressed. No significant lymphadenopathy in the chest. Evaluation of lungs is limited due to respiratory motion artifact. There are scattered emphysematous changes in the lungs. Dependent atelectasis in the lung bases. No definite airspace disease or consolidation. No pneumothorax. No pleural effusion. Review of the MIP images confirms the above findings. CTA ABDOMEN AND PELVIS FINDINGS Images obtained during arterial phase of contrast bolus administration demonstrated normal caliber abdominal aorta. Prominent calcific and noncalcific atherosclerotic changes throughout the abdominal aorta. Probable high-grade stenosis of the origins of the iliac arteries bilaterally. The abdominal aorta, celiac axis, superior mesenteric artery, single bilateral renal arteries, and bilateral iliac, external iliac, and internal iliac arteries are patent. The common femoral and proximal superficial and deep femoral arteries are patent. There appears to be a wall stabbed in the origin of the superior mesenteric artery. Stent is present in the right external iliac artery with  partial thrombosis in the stent. Probable high-grade stenosis at the origin of the external iliac artery. Renal nephrograms are symmetrical. Prominent calcification at the origins of the renal arteries. Portal venous phase images demonstrate normal flow to the portal and mesenteric veins. Liver parenchymal echotexture is somewhat heterogeneous likely related to passive congestion. No focal liver  lesions are identified. The gallbladder, pancreas, spleen, adrenal glands, kidneys, inferior vena cava, and retroperitoneal lymph nodes are unremarkable. Stomach, small bowel, and colon are not abnormally distended. Scattered stool in the colon. No free air or free fluid in the abdomen. Pelvis: Scattered diverticula in the sigmoid colon without evidence of diverticulitis. Appendix is not identified. Bladder wall is not thickened. Uterus is surgically absent. No free or loculated pelvic fluid collections. No pelvic mass or lymphadenopathy. Anterior compression of the L1 vertebra with sclerosis of the superior endplate and mild retropulsion of fracture fragments. There is about 40% loss of height anteriorly. There is progression since previous CT lumbar spine from 12/17/2015. Review of the MIP images confirms the above findings. IMPRESSION: No evidence of significant pulmonary embolus. No evidence of active pulmonary disease. 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 Extensive atherosclerotic changes throughout the abdominal aorta and mesenteric vessels although vessels remain patent. Multiple focal areas of stenosis are demonstrated. Previous stents in the superior mesenteric artery origin and in the right external iliac artery. Progression of the old compression fracture of the L1 vertebra. Electronically Signed   By: Lucienne Capers M.D.   On: 01/20/2016 22:19    Review of Systems  Constitutional: Negative for fever and chills.  HENT: Negative for sore throat and  tinnitus.   Eyes: Negative for blurred vision and redness.  Respiratory: Negative for cough and shortness of breath.   Cardiovascular: Positive for chest pain. Negative for palpitations, orthopnea and PND.  Gastrointestinal: Positive for nausea, vomiting, abdominal pain and diarrhea. Negative for blood in stool.  Genitourinary: Negative for dysuria, urgency and frequency.  Musculoskeletal: Positive for back pain. Negative for myalgias and joint pain.  Skin: Negative for rash.       No lesions  Neurological: Positive for dizziness. Negative for speech change, focal weakness and weakness.  Endo/Heme/Allergies: Does not bruise/bleed easily.       No temperature intolerance  Psychiatric/Behavioral: Negative for depression and suicidal ideas.    Blood pressure 158/76, pulse 70, temperature 98.1 F (36.7 C), temperature source Oral, resp. rate 19, height 5' 1"  (1.549 m), weight 72.122 kg (159 lb), SpO2 97 %. Physical Exam  Vitals reviewed. Constitutional: She is oriented to person, place, and time. She appears well-developed and well-nourished. No distress.  HENT:  Head: Normocephalic and atraumatic.  Mouth/Throat: Oropharynx is clear and moist.  Eyes: Conjunctivae and EOM are normal. Pupils are equal, round, and reactive to light. No scleral icterus.  Neck: Normal range of motion. Neck supple. No JVD present. No tracheal deviation present. No thyromegaly present.  Cardiovascular: Normal rate, regular rhythm and normal heart sounds.  Exam reveals no gallop and no friction rub.   No murmur heard. Respiratory: Effort normal and breath sounds normal.  GI: Soft. She exhibits no distension and no mass. Bowel sounds are decreased. There is tenderness. There is no rebound and no guarding.  Genitourinary:  Deferred  Musculoskeletal: Normal range of motion. She exhibits no edema.  Lymphadenopathy:    She has no cervical adenopathy.  Neurological: She is alert and oriented to person, place, and  time. No cranial nerve deficit. She exhibits normal muscle tone.  Skin: Skin is warm and dry. No rash noted. She is not diaphoretic. No erythema.  Psychiatric: She has a normal mood and affect. Her behavior is normal. Judgment and thought content normal.     Assessment/Plan This is a 55 year old female admitted for intractable  pain. 1. Pain: Multifactorial; abdomen, back, chest. I have switched the patient's Percocet to immediate release oxycodone for breakthrough pain. 2. Vascular disease: Contributes to abdominal pain, although a moderate amount of stool was also seen on abdominal x-ray. Patient's diarrhea may be consistent with encopresis or increased motility post-narcotic discontinuation . No indication of mesenteric ischemia. Consult GI 3. Coronary artery disease: Stable. The patient has negative troponins. EKG shows no changes at this time. I have ordered nitroglycerin in the event that she does have anginal chest pain but I believe at this time her chest pain is from her abdomen and accentuated by her compression fracture in her spine. Continue aspirin 4. Essential hypertension: Continue carvedilol 5. COPD: Continue inhaled corticosteroid and Spiriva 6. Hyperlipidemia: Continue statin therapy 7. Depression fracture: Check vitamin D; supplement diet with calcium. 8. DVT prophylaxis: Lovenox 9. GI prophylaxis: Continue pantoprazole per home regimen The patient is a full code. Time spent on admission orders and patient care approximately 45 minutes  Harrie Foreman, MD 01/21/2016, 3:40 AM

## 2016-01-21 NOTE — Progress Notes (Signed)
RN attempted to call MRI to see about pt's MRI time and no one picked up. The person at the radiology front desk stated that they may have already left for the day.

## 2016-01-21 NOTE — ED Notes (Signed)
Called floor to let them know pt on the way up 

## 2016-01-21 NOTE — ED Notes (Signed)
MD Brown at bedside.

## 2016-01-21 NOTE — Progress Notes (Signed)
[place] [facility] [place] NAME: Kirsten Castro    MR#:  [id number]  DATE OF BIRTH:  January 09, 1961  SUBJECTIVE:  CHIEF COMPLAINT:   Chief Complaint  Patient presents with  . Back Pain   the patient is a 55 year old Caucasian female with past medical history significant for history of multiple medical problems including peripheral vascular disease, coronary artery disease, status post coronary artery bypass grafting, stroke, COPD, hypertension, hyperlipidemia who presents to the hospital with complaints of nausea, vomiting and diarrhea going on for 4 weeks or longer. Patient has abdominal pain that radiated to her upper back. Approximately 4 weeks ago she fell down injuring her lower back, complains of low back pains radiating around her lower abdomen as well. On arrival to the hospital, patient's vitals were unremarkable, although total bilirubin was minimally elevated, CBC was normal, TSH was low. CT angiogram of chest, abdomen and pelvis showed no pulmonary embolism, changes of right heart failure with reflux of contrast into IVC and hepatic veins, atherosclerotic changes in the abdominal aorta and mesenteric vessels, although vessels remain patent. Compression fracture of L1 was also noted with progression. Chest x-ray was normal. Patient complains of significant abdominal pain radiating to the back, also intermittent nausea and vomiting and diarrhea  Review of Systems  Constitutional: Negative for fever, chills and weight loss.  HENT: Negative for congestion.   Eyes: Negative for blurred vision and double vision.  Respiratory: Negative for cough, sputum production, shortness of breath and wheezing.   Cardiovascular: Negative for chest pain, palpitations, orthopnea, leg swelling and PND.  Gastrointestinal: Positive for nausea, vomiting, abdominal pain and diarrhea. Negative for constipation and blood in stool.  Genitourinary: Negative for dysuria,  urgency, frequency and hematuria.  Musculoskeletal: Positive for myalgias, back pain and falls.  Neurological: Negative for dizziness, tremors, focal weakness and headaches.  Endo/Heme/Allergies: Does not bruise/bleed easily.  Psychiatric/Behavioral: Negative for depression. The patient does not have insomnia.     VITAL SIGNS: Blood pressure 141/57, pulse 61, temperature 97.7 F (36.5 C), temperature source Oral, resp. rate 19, height 5\' 1"  (1.549 m), weight 70.171 kg (154 lb 11.2 oz), SpO2 95 %.  PHYSICAL EXAMINATION:   GENERAL:  55 y.o.-year-old patient lying in the bed with no acute distress.  EYES: Pupils equal, round, reactive to light and accommodation. No scleral icterus. Extraocular muscles intact.  HEENT: Head atraumatic, normocephalic. Oropharynx and nasopharynx clear.  NECK:  Supple, no jugular venous distention. No thyroid enlargement, no tenderness.  LUNGS: Normal breath sounds bilaterally, no wheezing, rales,rhonchi or crepitation. No use of accessory muscles of respiration.  CARDIOVASCULAR: S1, S2 normal. No murmurs, rubs, or gallops.  ABDOMEN: Soft, tender in upper abdomen with no rebound, but guarding was noted, no organomegaly was noted, nondistended. Bowel sounds present. No organomegaly or mass.  EXTREMITIES: No pedal edema, cyanosis, or clubbing. Palpation of spine revealed significant tenderness in upper lumbar lower thoracic level, also lower lumbar area, no CVA tenderness was noted on percussion NEUROLOGIC: Cranial nerves II through XII are intact. Muscle strength 5/5 in all extremities. Sensation intact. Gait not checked.  PSYCHIATRIC: The patient is alert and oriented x 3.  SKIN: No obvious rash, lesion, or ulcer.   ORDERS/RESULTS REVIEWED:   CBC  Recent Labs Lab 01/20/16 1953  WBC 9.5  HGB 14.7  HCT 44.5  PLT 264  MCV 98.5  MCH 32.6  MCHC 33.1  RDW 15.6*    ------------------------------------------------------------------------------------------------------------------  Chemistries   Recent Labs  Lab 01/20/16 1953  NA 137  K 4.3  CL 105  CO2 25  GLUCOSE 94  BUN 12  CREATININE 0.62  CALCIUM 9.5  AST 25  ALT 24  ALKPHOS 89  BILITOT 1.3*   ------------------------------------------------------------------------------------------------------------------ estimated creatinine clearance is 73.8 mL/min (by C-G formula based on Cr of 0.62). ------------------------------------------------------------------------------------------------------------------  Recent Labs  01/20/16 1953  TSH 0.271*    Cardiac Enzymes  Recent Labs Lab 01/20/16 1953 01/20/16 2300 01/21/16 0454  TROPONINI 0.03 0.03 <0.03   ------------------------------------------------------------------------------------------------------------------ Invalid input(s): POCBNP ---------------------------------------------------------------------------------------------------------------  RADIOLOGY: Dg Chest 2 View  01/20/2016  CLINICAL DATA:  Acute onset of chest pain, shortness of breath and nausea onset 1 hour prior. EXAM: CHEST  2 VIEW COMPARISON:  Radiographs 12/15/2015, 11/20/2015 FINDINGS: Patient is post CABG with coronary stent. Great vessels stent also unchanged. Cardiomediastinal contours are unchanged, mild cardiomegaly. No pulmonary edema, confluent airspace disease, pleural effusion or pneumothorax. Sternum is absent. No acute osseous abnormality. IMPRESSION: No acute process or change from prior exams. Electronically Signed   By: Jeb Levering M.D.   On: 01/20/2016 19:14   Ct Angio Chest Pe W/cm &/or Wo Cm  01/20/2016  CLINICAL DATA:  Pt from home c/o 10/10 pain between shoulder blades that started today about 45 min PTA. Pt. Reports vomiting x 1week, hx of HTN, triple bypass surgery, back problems, and no sternum, former smoker, and hyster., 150 ml  isovue 370 given-approx. 50 ml infiltrated into left forearm, Dr. Gerilyn Nestle consulted and ER MD, Dr. Joni Fears aware EXAM: CT ANGIOGRAPHY CHEST, ABDOMEN AND PELVIS TECHNIQUE: Multidetector CT imaging through the chest, abdomen and pelvis was performed using the standard protocol during bolus administration of intravenous contrast. Multiplanar reconstructed images and MIPs were obtained and reviewed to evaluate the vascular anatomy. CT images of the chest were optimized for evaluation of pulmonary arteries. Additional portal venous phase images of the abdomen and pelvis were also obtained. CONTRAST:  150 mL Isovue 370 COMPARISON:  CT abdomen and pelvis 12/07/2015 FINDINGS: CTA CHEST FINDINGS Technically adequate study with good opacification of the central and segmental pulmonary arteries. No focal filling defects. No evidence of significant pulmonary embolus. Postoperative changes in the mediastinum with absence of the sternum and bypass grafting changes. Mild cardiac enlargement with predominant right heart enlargement. There is reflux of contrast material into the IVC and hepatic veins suggesting passive congestion due to right heart failure. Coronary artery calcifications. Calcifications in the thoracic aorta. Aorta and great vessels are patent. Prominent atherosclerotic calcification in the aorta and great vessel origins. Esophagus is decompressed. No significant lymphadenopathy in the chest. Evaluation of lungs is limited due to respiratory motion artifact. There are scattered emphysematous changes in the lungs. Dependent atelectasis in the lung bases. No definite airspace disease or consolidation. No pneumothorax. No pleural effusion. Review of the MIP images confirms the above findings. CTA ABDOMEN AND PELVIS FINDINGS Images obtained during arterial phase of contrast bolus administration demonstrated normal caliber abdominal aorta. Prominent calcific and noncalcific atherosclerotic changes throughout the  abdominal aorta. Probable high-grade stenosis of the origins of the iliac arteries bilaterally. The abdominal aorta, celiac axis, superior mesenteric artery, single bilateral renal arteries, and bilateral iliac, external iliac, and internal iliac arteries are patent. The common femoral and proximal superficial and deep femoral arteries are patent. There appears to be a wall stabbed in the origin of the superior mesenteric artery. Stent is present in the right external iliac artery with partial thrombosis in the stent. Probable high-grade stenosis at the origin  of the external iliac artery. Renal nephrograms are symmetrical. Prominent calcification at the origins of the renal arteries. Portal venous phase images demonstrate normal flow to the portal and mesenteric veins. Liver parenchymal echotexture is somewhat heterogeneous likely related to passive congestion. No focal liver lesions are identified. The gallbladder, pancreas, spleen, adrenal glands, kidneys, inferior vena cava, and retroperitoneal lymph nodes are unremarkable. Stomach, small bowel, and colon are not abnormally distended. Scattered stool in the colon. No free air or free fluid in the abdomen. Pelvis: Scattered diverticula in the sigmoid colon without evidence of diverticulitis. Appendix is not identified. Bladder wall is not thickened. Uterus is surgically absent. No free or loculated pelvic fluid collections. No pelvic mass or lymphadenopathy. Anterior compression of the L1 vertebra with sclerosis of the superior endplate and mild retropulsion of fracture fragments. There is about 40% loss of height anteriorly. There is progression since previous CT lumbar spine from 12/17/2015. Review of the MIP images confirms the above findings. IMPRESSION: No evidence of significant pulmonary embolus. No evidence of active pulmonary disease. 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 Extensive atherosclerotic  changes throughout the abdominal aorta and mesenteric vessels although vessels remain patent. Multiple focal areas of stenosis are demonstrated. Previous stents in the superior mesenteric artery origin and in the right external iliac artery. Progression of the old compression fracture of the L1 vertebra. Electronically Signed   By: Lucienne Capers M.D.   On: 01/20/2016 22:19   Ct Cta Abd/pel W/cm &/or W/o Cm  01/20/2016  CLINICAL DATA:  Pt from home c/o 10/10 pain between shoulder blades that started today about 45 min PTA. Pt. Reports vomiting x 1week, hx of HTN, triple bypass surgery, back problems, and no sternum, former smoker, and hyster., 150 ml isovue 370 given-approx. 50 ml infiltrated into left forearm, Dr. Gerilyn Nestle consulted and ER MD, Dr. Joni Fears aware EXAM: CT ANGIOGRAPHY CHEST, ABDOMEN AND PELVIS TECHNIQUE: Multidetector CT imaging through the chest, abdomen and pelvis was performed using the standard protocol during bolus administration of intravenous contrast. Multiplanar reconstructed images and MIPs were obtained and reviewed to evaluate the vascular anatomy. CT images of the chest were optimized for evaluation of pulmonary arteries. Additional portal venous phase images of the abdomen and pelvis were also obtained. CONTRAST:  150 mL Isovue 370 COMPARISON:  CT abdomen and pelvis 12/07/2015 FINDINGS: CTA CHEST FINDINGS Technically adequate study with good opacification of the central and segmental pulmonary arteries. No focal filling defects. No evidence of significant pulmonary embolus. Postoperative changes in the mediastinum with absence of the sternum and bypass grafting changes. Mild cardiac enlargement with predominant right heart enlargement. There is reflux of contrast material into the IVC and hepatic veins suggesting passive congestion due to right heart failure. Coronary artery calcifications. Calcifications in the thoracic aorta. Aorta and great vessels are patent. Prominent  atherosclerotic calcification in the aorta and great vessel origins. Esophagus is decompressed. No significant lymphadenopathy in the chest. Evaluation of lungs is limited due to respiratory motion artifact. There are scattered emphysematous changes in the lungs. Dependent atelectasis in the lung bases. No definite airspace disease or consolidation. No pneumothorax. No pleural effusion. Review of the MIP images confirms the above findings. CTA ABDOMEN AND PELVIS FINDINGS Images obtained during arterial phase of contrast bolus administration demonstrated normal caliber abdominal aorta. Prominent calcific and noncalcific atherosclerotic changes throughout the abdominal aorta. Probable high-grade stenosis of the origins of the iliac arteries bilaterally. The abdominal aorta,  celiac axis, superior mesenteric artery, single bilateral renal arteries, and bilateral iliac, external iliac, and internal iliac arteries are patent. The common femoral and proximal superficial and deep femoral arteries are patent. There appears to be a wall stabbed in the origin of the superior mesenteric artery. Stent is present in the right external iliac artery with partial thrombosis in the stent. Probable high-grade stenosis at the origin of the external iliac artery. Renal nephrograms are symmetrical. Prominent calcification at the origins of the renal arteries. Portal venous phase images demonstrate normal flow to the portal and mesenteric veins. Liver parenchymal echotexture is somewhat heterogeneous likely related to passive congestion. No focal liver lesions are identified. The gallbladder, pancreas, spleen, adrenal glands, kidneys, inferior vena cava, and retroperitoneal lymph nodes are unremarkable. Stomach, small bowel, and colon are not abnormally distended. Scattered stool in the colon. No free air or free fluid in the abdomen. Pelvis: Scattered diverticula in the sigmoid colon without evidence of diverticulitis. Appendix is not  identified. Bladder wall is not thickened. Uterus is surgically absent. No free or loculated pelvic fluid collections. No pelvic mass or lymphadenopathy. Anterior compression of the L1 vertebra with sclerosis of the superior endplate and mild retropulsion of fracture fragments. There is about 40% loss of height anteriorly. There is progression since previous CT lumbar spine from 12/17/2015. Review of the MIP images confirms the above findings. IMPRESSION: No evidence of significant pulmonary embolus. No evidence of active pulmonary disease. 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 Extensive atherosclerotic changes throughout the abdominal aorta and mesenteric vessels although vessels remain patent. Multiple focal areas of stenosis are demonstrated. Previous stents in the superior mesenteric artery origin and in the right external iliac artery. Progression of the old compression fracture of the L1 vertebra. Electronically Signed   By: Lucienne Capers M.D.   On: 01/20/2016 22:19    EKG:  Orders placed or performed during the hospital encounter of 01/20/16  . ED EKG within 10 minutes  . ED EKG within 10 minutes  . EKG 12-Lead  . EKG 12-Lead    ASSESSMENT AND PLAN:  Active Problems:   Intractable pain  #1. Upper abdominal pain of unclear etiology, could be related to L1 compression fracture, get MRI of lumbar spine and involved orthopedist surgeon if acute compression fracture. Patient will be consulted by gastroenterologist and EGD will be done during this admission, continue patient on pain medications, Protonix. #2. Subacute gastroenteritis, get rotavirus tested in stool, supportive therapy with IV fluids, stool cultures done 31st of March 2017 showed no growth of common Enteric pathogens.  #3. Elevated total bilirubin, get right upper quadrant abdominal ultrasound, repeat LFTs tomorrow morning, lipase level was checked in emergency room and it  was normal #4. Lumbar pain, get MRI of lumbar spine and orthopedic surgeons involved if acute fracture is noted, continue pain medications for now  Management plans discussed with the patient, family and they are in agreement.   DRUG ALLERGIES:  Allergies  Allergen Reactions  . Ferrous Sulfate Hives    CODE STATUS:     Code Status Orders        Start     Ordered   01/21/16 0312  Full code   Continuous     01/21/16 0311    Code Status History    Date Active Date Inactive Code Status Order ID Comments User Context   12/15/2015 10:44 PM 12/18/2015  7:56 PM Full Code [id number]  Vaughan Basta, MD Inpatient   02/10/2015 12:02 PM 02/12/2015  7:12 PM Full Code [id number]  Algernon Huxley, MD Inpatient      TOTAL TIME TAKING CARE OF THIS PATIENT: 40 minutes.  Discussed with patient's husband, all questions were answered, voiced understanding  Britanny Marksberry M.D on 01/21/2016 at 12:55 PM  Between 7am to 6pm - Pager - [phone]  After 6pm go to [email or web address] - password [email or web address] [facility]  [facility]  Office  [phone]  CC: Primary care physician; Leonel Ramsay, MD

## 2016-01-21 NOTE — ED Notes (Signed)
Patient transported to CT 

## 2016-01-21 NOTE — ED Notes (Signed)
Ice removed from pt's arm. Arm swollen at site of extravasation. Arm no longer warm to touch

## 2016-01-21 NOTE — Plan of Care (Signed)
Problem: Pain Managment: Goal: General experience of comfort will improve Outcome: Not Progressing Pt is still experiencing pain requiring pain medication.

## 2016-01-21 NOTE — ED Notes (Signed)
Called floor to ask status of bed. Was informed charge nurse speaking with floor supervisor about bed.

## 2016-01-21 NOTE — Consult Note (Signed)
[facility]  [address], [place], [place] [address] Phone: [phone] Fax : [phone]  Consultation  Referring Provider:     No ref. provider found Primary Care Physician:  Leonel Ramsay, MD Primary Gastroenterologist:  Dr. Gustavo Lah Reason for Consultation:     [place]  Date of Admission:  01/20/2016 Date of Consultation:  01/21/2016         HPI:   Kirsten Castro is a 55 y.o. female who was admitted with a history of multiple medical problems including peripheral vascular disease and cardiac disease. The patient was in the hospital one month ago for nausea vomiting abdominal pain and was found to have intestinal angina. The patient was discharged and was supposed to have an EGD and colonoscopy as an outpatient but both were canceled for different reasons including hypotension. The patient has not had any investigation of her nausea vomiting or diarrhea. The patient states that her abdominal pain now is the same as it was prior to having intervention with her vascular surgeon. She now reports that she has problems swallowing but has a history of multiple vertebrae disorders. The patient has chronic neck and back pain. The patient was admitted last night with the nausea vomiting and diarrhea. The patient also reports that she's had some weight loss but is not able to tell me exactly how much weight loss. She does report that she vomits with almost every meal. The CT scan of the abdomen did not show any cause for her symptoms. The patient was given a diet in the emergency room and she ate breakfast and lunch today.  Past Medical History  Diagnosis Date  . Stroke ([place])   . Coronary artery disease   . COPD (chronic obstructive pulmonary disease) ([place])   . Myocardial infarction ([place])   . Anginal pain ([place])   . Hypertension   . Cancer [facility])     melanoma skin cancer    Past Surgical History  Procedure Laterality Date  . Coronary artery bypass graft    .  Peripheral vascular catheterization N/A 02/10/2015    Procedure: Carotid PTA/Stent Intervention;  Surgeon: Katha Cabal, MD;  Location: [facility];  Service: Cardiovascular;  Laterality: N/A;  . Stent placement vascular ([facility] hx)    . Peripheral vascular catheterization N/A 12/17/2015    Procedure: Visceral Angiography;  Surgeon: Katha Cabal, MD;  Location: [place] [facility];  Service: Cardiovascular;  Laterality: N/A;  . Peripheral vascular catheterization N/A 12/17/2015    Procedure: Visceral Artery Intervention;  Surgeon: Katha Cabal, MD;  Location: [place] [facility];  Service: Cardiovascular;  Laterality: N/A;  . Abdominal hysterectomy    . Other surgical history  Jul 11 2014    sternum removal  . Nose surgery      cancer removal    Prior to Admission medications   Medication Sig Start Date End Date Taking? Authorizing Provider  acetaminophen (TYLENOL) 500 MG tablet Take 1,000 mg by mouth every 6 (six) hours as needed for mild pain or headache.   Yes Historical Provider, MD  ALPRAZolam Duanne Moron) 1 MG tablet Take 1 mg by mouth 3 (three) times daily.   Yes Historical Provider, MD  aspirin 81 MG EC tablet Take 81 mg by mouth daily.    Yes Historical Provider, MD  atorvastatin (LIPITOR) 80 MG tablet Take 80 mg by mouth daily.   Yes Historical Provider, MD  carvedilol (COREG) 12.5 MG tablet Take 12.5 mg by  mouth 2 (two) times daily with a meal.   Yes Historical Provider, MD  cetirizine (ZYRTEC) 10 MG tablet Take 10 mg by mouth daily.   Yes Historical Provider, MD  esomeprazole (NEXIUM) 20 MG capsule Take 20 mg by mouth daily at 12 noon.   Yes Historical Provider, MD  Fluticasone-Salmeterol (ADVAIR) 250-50 MCG/DOSE AEPB Inhale 1 puff into the lungs 2 (two) times daily.   Yes Historical Provider, MD  Magnesium 250 MG TABS Take 250 mg by mouth 2 (two) times daily.   Yes Historical Provider, MD  potassium chloride (K-DUR,KLOR-CON) 10 MEQ tablet Take 10 mEq by mouth 2  (two) times daily.   Yes Historical Provider, MD  traMADol (ULTRAM) 50 MG tablet Take 50 mg by mouth every 6 (six) hours as needed for moderate pain.   Yes Historical Provider, MD  traZODone (DESYREL) 50 MG tablet Take 50 mg by mouth at bedtime as needed for sleep.    Yes Historical Provider, MD    Family History  Problem Relation Age of Onset  . Breast cancer Sister   . CAD Father   . CAD Brother      Social History  Substance Use Topics  . Smoking status: Former Research scientist (life sciences)  . Smokeless tobacco: None  . Alcohol Use: No    Allergies as of 01/20/2016 - Review Complete 01/20/2016  Allergen Reaction Noted  . Ferrous sulfate Hives 12/15/2015    Review of Systems:    All systems reviewed and negative except where noted in HPI.   Physical Exam:  Vital signs in last 24 hours: Temp:  [97.7 F (36.5 C)-98.1 F (36.7 C)] 97.9 F (36.6 C) (04/21 1337) Pulse Rate:  [58-77] 72 (04/21 1337) Resp:  [18-29] 18 (04/21 1337) BP: (101-180)/(57-98) 154/70 mmHg (04/21 1337) SpO2:  [95 %-99 %] 99 % (04/21 1337) Weight:  [154 lb 11.2 oz (70.171 kg)-159 lb (72.122 kg)] 154 lb 11.2 oz (70.171 kg) (04/21 0307) Last BM Date: 01/20/16 General:   Pleasant, cooperative in NAD Head:  Normocephalic and atraumatic. Eyes:   No icterus.   Conjunctiva pink. PERRLA. Ears:  Normal auditory acuity. Neck:  Supple; no masses or thyroidomegaly Lungs: Respirations even and unlabored. Lungs clear to auscultation bilaterally.   No wheezes, crackles, or rhonchi.  Heart:  Regular rate and rhythm;  Without murmur, clicks, rubs or gallops Abdomen:  Soft, nondistended, nontender. Normal bowel sounds. No appreciable masses or hepatomegaly.  No rebound or guarding.  Rectal:  Not performed. Msk:  Symmetrical without gross deformities.  Strength  Extremities:  Without edema, cyanosis or clubbing. Neurologic:  Alert and oriented x3;  grossly normal neurologically. Skin:  Intact without significant lesions or  rashes. Cervical Nodes:  No significant cervical adenopathy. Psych:  Alert and cooperative. Normal affect.  LAB RESULTS:  Recent Labs  01/20/16 1953  WBC 9.5  HGB 14.7  HCT 44.5  PLT 264   BMET  Recent Labs  01/20/16 1953  NA 137  K 4.3  CL 105  CO2 25  GLUCOSE 94  BUN 12  CREATININE 0.62  CALCIUM 9.5   LFT  Recent Labs  01/20/16 1953  PROT 6.3*  ALBUMIN 3.8  AST 25  ALT 24  ALKPHOS 89  BILITOT 1.3*  BILIDIR 0.3  IBILI 1.0*   PT/INR No results for input(s): LABPROT, INR in the last 72 hours.  STUDIES: Dg Chest 2 View  01/20/2016  CLINICAL DATA:  Acute onset of chest pain, shortness of breath and nausea onset 1  hour prior. EXAM: CHEST  2 VIEW COMPARISON:  Radiographs 12/15/2015, 11/20/2015 FINDINGS: Patient is post CABG with coronary stent. Great vessels stent also unchanged. Cardiomediastinal contours are unchanged, mild cardiomegaly. No pulmonary edema, confluent airspace disease, pleural effusion or pneumothorax. Sternum is absent. No acute osseous abnormality. IMPRESSION: No acute process or change from prior exams. Electronically Signed   By: Jeb Levering M.D.   On: 01/20/2016 19:14   Ct Angio Chest Pe W/cm &/or Wo Cm  01/20/2016  CLINICAL DATA:  Pt from home c/o 10/10 pain between shoulder blades that started today about 45 min PTA. Pt. Reports vomiting x 1week, hx of HTN, triple bypass surgery, back problems, and no sternum, former smoker, and hyster., 150 ml isovue 370 given-approx. 50 ml infiltrated into left forearm, Dr. Gerilyn Nestle consulted and ER MD, Dr. Joni Fears aware EXAM: CT ANGIOGRAPHY CHEST, ABDOMEN AND PELVIS TECHNIQUE: Multidetector CT imaging through the chest, abdomen and pelvis was performed using the standard protocol during bolus administration of intravenous contrast. Multiplanar reconstructed images and MIPs were obtained and reviewed to evaluate the vascular anatomy. CT images of the chest were optimized for evaluation of pulmonary  arteries. Additional portal venous phase images of the abdomen and pelvis were also obtained. CONTRAST:  150 mL Isovue 370 COMPARISON:  CT abdomen and pelvis 12/07/2015 FINDINGS: CTA CHEST FINDINGS Technically adequate study with good opacification of the central and segmental pulmonary arteries. No focal filling defects. No evidence of significant pulmonary embolus. Postoperative changes in the mediastinum with absence of the sternum and bypass grafting changes. Mild cardiac enlargement with predominant right heart enlargement. There is reflux of contrast material into the IVC and hepatic veins suggesting passive congestion due to right heart failure. Coronary artery calcifications. Calcifications in the thoracic aorta. Aorta and great vessels are patent. Prominent atherosclerotic calcification in the aorta and great vessel origins. Esophagus is decompressed. No significant lymphadenopathy in the chest. Evaluation of lungs is limited due to respiratory motion artifact. There are scattered emphysematous changes in the lungs. Dependent atelectasis in the lung bases. No definite airspace disease or consolidation. No pneumothorax. No pleural effusion. Review of the MIP images confirms the above findings. CTA ABDOMEN AND PELVIS FINDINGS Images obtained during arterial phase of contrast bolus administration demonstrated normal caliber abdominal aorta. Prominent calcific and noncalcific atherosclerotic changes throughout the abdominal aorta. Probable high-grade stenosis of the origins of the iliac arteries bilaterally. The abdominal aorta, celiac axis, superior mesenteric artery, single bilateral renal arteries, and bilateral iliac, external iliac, and internal iliac arteries are patent. The common femoral and proximal superficial and deep femoral arteries are patent. There appears to be a wall stabbed in the origin of the superior mesenteric artery. Stent is present in the right external iliac artery with partial  thrombosis in the stent. Probable high-grade stenosis at the origin of the external iliac artery. Renal nephrograms are symmetrical. Prominent calcification at the origins of the renal arteries. Portal venous phase images demonstrate normal flow to the portal and mesenteric veins. Liver parenchymal echotexture is somewhat heterogeneous likely related to passive congestion. No focal liver lesions are identified. The gallbladder, pancreas, spleen, adrenal glands, kidneys, inferior vena cava, and retroperitoneal lymph nodes are unremarkable. Stomach, small bowel, and colon are not abnormally distended. Scattered stool in the colon. No free air or free fluid in the abdomen. Pelvis: Scattered diverticula in the sigmoid colon without evidence of diverticulitis. Appendix is not identified. Bladder wall is not thickened. Uterus is surgically absent. No free or loculated pelvic fluid  collections. No pelvic mass or lymphadenopathy. Anterior compression of the L1 vertebra with sclerosis of the superior endplate and mild retropulsion of fracture fragments. There is about 40% loss of height anteriorly. There is progression since previous CT lumbar spine from 12/17/2015. Review of the MIP images confirms the above findings. IMPRESSION: No evidence of significant pulmonary embolus. No evidence of active pulmonary disease. 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 Extensive atherosclerotic changes throughout the abdominal aorta and mesenteric vessels although vessels remain patent. Multiple focal areas of stenosis are demonstrated. Previous stents in the superior mesenteric artery origin and in the right external iliac artery. Progression of the old compression fracture of the L1 vertebra. Electronically Signed   By: Lucienne Capers M.D.   On: 01/20/2016 22:19   Ct Cta Abd/pel W/cm &/or W/o Cm  01/20/2016  CLINICAL DATA:  Pt from home c/o 10/10 pain between shoulder blades  that started today about 45 min PTA. Pt. Reports vomiting x 1week, hx of HTN, triple bypass surgery, back problems, and no sternum, former smoker, and hyster., 150 ml isovue 370 given-approx. 50 ml infiltrated into left forearm, Dr. Gerilyn Nestle consulted and ER MD, Dr. Joni Fears aware EXAM: CT ANGIOGRAPHY CHEST, ABDOMEN AND PELVIS TECHNIQUE: Multidetector CT imaging through the chest, abdomen and pelvis was performed using the standard protocol during bolus administration of intravenous contrast. Multiplanar reconstructed images and MIPs were obtained and reviewed to evaluate the vascular anatomy. CT images of the chest were optimized for evaluation of pulmonary arteries. Additional portal venous phase images of the abdomen and pelvis were also obtained. CONTRAST:  150 mL Isovue 370 COMPARISON:  CT abdomen and pelvis 12/07/2015 FINDINGS: CTA CHEST FINDINGS Technically adequate study with good opacification of the central and segmental pulmonary arteries. No focal filling defects. No evidence of significant pulmonary embolus. Postoperative changes in the mediastinum with absence of the sternum and bypass grafting changes. Mild cardiac enlargement with predominant right heart enlargement. There is reflux of contrast material into the IVC and hepatic veins suggesting passive congestion due to right heart failure. Coronary artery calcifications. Calcifications in the thoracic aorta. Aorta and great vessels are patent. Prominent atherosclerotic calcification in the aorta and great vessel origins. Esophagus is decompressed. No significant lymphadenopathy in the chest. Evaluation of lungs is limited due to respiratory motion artifact. There are scattered emphysematous changes in the lungs. Dependent atelectasis in the lung bases. No definite airspace disease or consolidation. No pneumothorax. No pleural effusion. Review of the MIP images confirms the above findings. CTA ABDOMEN AND PELVIS FINDINGS Images obtained during  arterial phase of contrast bolus administration demonstrated normal caliber abdominal aorta. Prominent calcific and noncalcific atherosclerotic changes throughout the abdominal aorta. Probable high-grade stenosis of the origins of the iliac arteries bilaterally. The abdominal aorta, celiac axis, superior mesenteric artery, single bilateral renal arteries, and bilateral iliac, external iliac, and internal iliac arteries are patent. The common femoral and proximal superficial and deep femoral arteries are patent. There appears to be a wall stabbed in the origin of the superior mesenteric artery. Stent is present in the right external iliac artery with partial thrombosis in the stent. Probable high-grade stenosis at the origin of the external iliac artery. Renal nephrograms are symmetrical. Prominent calcification at the origins of the renal arteries. Portal venous phase images demonstrate normal flow to the portal and mesenteric veins. Liver parenchymal echotexture is somewhat heterogeneous likely related to passive congestion. No focal liver lesions are identified. The  gallbladder, pancreas, spleen, adrenal glands, kidneys, inferior vena cava, and retroperitoneal lymph nodes are unremarkable. Stomach, small bowel, and colon are not abnormally distended. Scattered stool in the colon. No free air or free fluid in the abdomen. Pelvis: Scattered diverticula in the sigmoid colon without evidence of diverticulitis. Appendix is not identified. Bladder wall is not thickened. Uterus is surgically absent. No free or loculated pelvic fluid collections. No pelvic mass or lymphadenopathy. Anterior compression of the L1 vertebra with sclerosis of the superior endplate and mild retropulsion of fracture fragments. There is about 40% loss of height anteriorly. There is progression since previous CT lumbar spine from 12/17/2015. Review of the MIP images confirms the above findings. IMPRESSION: No evidence of significant pulmonary  embolus. No evidence of active pulmonary disease. 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 Extensive atherosclerotic changes throughout the abdominal aorta and mesenteric vessels although vessels remain patent. Multiple focal areas of stenosis are demonstrated. Previous stents in the superior mesenteric artery origin and in the right external iliac artery. Progression of the old compression fracture of the L1 vertebra. Electronically Signed   By: Lucienne Capers M.D.   On: 01/20/2016 22:19      Impression / Plan:   Kirsten Castro is a 55 y.o. y/o female with With nausea vomiting and diarrhea for over a month. The patient was evaluated by Dr. Gustavo Lah and Dr. Candace Cruise in the past. The patient was supposed to have a workup as an outpatient but for a variety of reasons the procedures were canceled. The patient has eaten today and cannot undergo any procedures at this time. The patient had an abnormal bilirubin but it was all direct and her liver enzymes were normal indicating this is unlikely a liver issue. The patient will need an EGD and colonoscopy and if the patient is still here on Monday we will plan for that at that time. Patient and her husband have been explained the plan and agree with it.   Thank you for involving me in the care of this patient.        Ollen Bowl, MD  01/21/2016, 1:51 PM   Note: This dictation was prepared with Dragon dictation along with smaller phrase technology. Any transcriptional errors that result from this process are unintentional.

## 2016-01-22 ENCOUNTER — Observation Stay: Payer: BLUE CROSS/BLUE SHIELD

## 2016-01-22 MED ORDER — OXYCODONE HCL 5 MG PO TABS
10.0000 mg | ORAL_TABLET | ORAL | Status: DC | PRN
  Administered 2016-01-22 – 2016-01-24 (×6): 10 mg via ORAL
  Filled 2016-01-22 (×6): qty 2

## 2016-01-22 NOTE — Consult Note (Signed)
Consultation  Referring Provider:     No ref. provider found Primary Care Physician:  Leonel Ramsay, MD Primary Gastroenterologist:  Dr. Gustavo Lah Reason for Consultation:     Mel[place]  Date of Admission:  01/20/2016 Date of Follow Up:  01/22/2016         HPI:   Kirsten Castro is a 55 y.o. female who was admitted with a history of multiple medical problems including peripheral vascular disease and cardiac disease. She continues to report abdominal pain, n/v this am.    Review of Systems:    All systems reviewed and negative except where noted in HPI.   Physical Exam:  Vital signs in last 24 hours: Temp:  [97.9 F (36.6 C)-98.1 F (36.7 C)] 98.1 F (36.7 C) (04/22 0631) Pulse Rate:  [72-79] 79 (04/22 0631) Resp:  [16-24] 24 (04/22 0631) BP: (154-185)/(70-90) 185/90 mmHg (04/22 0631) SpO2:  [97 %-99 %] 99 % (04/22 0631) Weight:  [70.489 kg (155 lb 6.4 oz)] 70.489 kg (155 lb 6.4 oz) (04/22 0629) Last BM Date: 01/21/16 General:   Pleasant, cooperative in NAD Head:  Normocephalic and atraumatic. Eyes:   No icterus.   Conjunctiva pink. PERRLA. Ears:  Normal auditory acuity. Neck:  Supple; no masses or thyroidomegaly Lungs: Respirations even and unlabored. Lungs clear to auscultation bilaterally.   No wheezes, crackles, or rhonchi.  Heart:  Regular rate and rhythm;  Without murmur, clicks, rubs or gallops Abdomen:  Soft, nondistended, mildly tender to palpation diffusely. Normal bowel sounds. No appreciable masses or hepatomegaly.  No rebound or guarding.  Msk:  Symmetrical without gross deformities.  Strength  Extremities:  Without edema, cyanosis or clubbing. Neurologic:  Alert and oriented x3;  grossly normal neurologically. Skin:  Intact without significant lesions or rashes. Psych:  Alert and cooperative. Normal affect.  LAB RESULTS:  Recent Labs  01/20/16 1953  WBC 9.5  HGB 14.7  HCT 44.5  PLT 264   BMET  Recent Labs  01/20/16 1953  NA 137  K 4.3   CL 105  CO2 25  GLUCOSE 94  BUN 12  CREATININE 0.62  CALCIUM 9.5   LFT  Recent Labs  01/20/16 1953  PROT 6.3*  ALBUMIN 3.8  AST 25  ALT 24  ALKPHOS 89  BILITOT 1.3*  BILIDIR 0.3  IBILI 1.0*   STUDIES: Dg Chest 2 View  01/20/2016  CLINICAL DATA:  Acute onset of chest pain, shortness of breath and nausea onset 1 hour prior. EXAM: CHEST  2 VIEW COMPARISON:  Radiographs 12/15/2015, 11/20/2015 FINDINGS: Patient is post CABG with coronary stent. Great vessels stent also unchanged. Cardiomediastinal contours are unchanged, mild cardiomegaly. No pulmonary edema, confluent airspace disease, pleural effusion or pneumothorax. Sternum is absent. No acute osseous abnormality. IMPRESSION: No acute process or change from prior exams. Electronically Signed   By: Jeb Levering M.D.   On: 01/20/2016 19:14   Ct Angio Chest Pe W/cm &/or Wo Cm  01/20/2016  CLINICAL DATA:  Pt from home c/o 10/10 pain between shoulder blades that started today about 45 min PTA. Pt. Reports vomiting x 1week, hx of HTN, triple bypass surgery, back problems, and no sternum, former smoker, and hyster., 150 ml isovue 370 given-approx. 50 ml infiltrated into left forearm, Dr. Gerilyn Nestle consulted and ER MD, Dr. Joni Fears aware EXAM: CT ANGIOGRAPHY CHEST, ABDOMEN AND PELVIS TECHNIQUE: Multidetector CT imaging through the chest, abdomen and pelvis was performed using the standard protocol during bolus administration of intravenous contrast. Multiplanar reconstructed images  and MIPs were obtained and reviewed to evaluate the vascular anatomy. CT images of the chest were optimized for evaluation of pulmonary arteries. Additional portal venous phase images of the abdomen and pelvis were also obtained. CONTRAST:  150 mL Isovue 370 COMPARISON:  CT abdomen and pelvis 12/07/2015 FINDINGS: CTA CHEST FINDINGS Technically adequate study with good opacification of the central and segmental pulmonary arteries. No focal filling defects. No evidence  of significant pulmonary embolus. Postoperative changes in the mediastinum with absence of the sternum and bypass grafting changes. Mild cardiac enlargement with predominant right heart enlargement. There is reflux of contrast material into the IVC and hepatic veins suggesting passive congestion due to right heart failure. Coronary artery calcifications. Calcifications in the thoracic aorta. Aorta and great vessels are patent. Prominent atherosclerotic calcification in the aorta and great vessel origins. Esophagus is decompressed. No significant lymphadenopathy in the chest. Evaluation of lungs is limited due to respiratory motion artifact. There are scattered emphysematous changes in the lungs. Dependent atelectasis in the lung bases. No definite airspace disease or consolidation. No pneumothorax. No pleural effusion. Review of the MIP images confirms the above findings. CTA ABDOMEN AND PELVIS FINDINGS Images obtained during arterial phase of contrast bolus administration demonstrated normal caliber abdominal aorta. Prominent calcific and noncalcific atherosclerotic changes throughout the abdominal aorta. Probable high-grade stenosis of the origins of the iliac arteries bilaterally. The abdominal aorta, celiac axis, superior mesenteric artery, single bilateral renal arteries, and bilateral iliac, external iliac, and internal iliac arteries are patent. The common femoral and proximal superficial and deep femoral arteries are patent. There appears to be a wall stabbed in the origin of the superior mesenteric artery. Stent is present in the right external iliac artery with partial thrombosis in the stent. Probable high-grade stenosis at the origin of the external iliac artery. Renal nephrograms are symmetrical. Prominent calcification at the origins of the renal arteries. Portal venous phase images demonstrate normal flow to the portal and mesenteric veins. Liver parenchymal echotexture is somewhat heterogeneous  likely related to passive congestion. No focal liver lesions are identified. The gallbladder, pancreas, spleen, adrenal glands, kidneys, inferior vena cava, and retroperitoneal lymph nodes are unremarkable. Stomach, small bowel, and colon are not abnormally distended. Scattered stool in the colon. No free air or free fluid in the abdomen. Pelvis: Scattered diverticula in the sigmoid colon without evidence of diverticulitis. Appendix is not identified. Bladder wall is not thickened. Uterus is surgically absent. No free or loculated pelvic fluid collections. No pelvic mass or lymphadenopathy. Anterior compression of the L1 vertebra with sclerosis of the superior endplate and mild retropulsion of fracture fragments. There is about 40% loss of height anteriorly. There is progression since previous CT lumbar spine from 12/17/2015. Review of the MIP images confirms the above findings. IMPRESSION: No evidence of significant pulmonary embolus. No evidence of active pulmonary disease. 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 Extensive atherosclerotic changes throughout the abdominal aorta and mesenteric vessels although vessels remain patent. Multiple focal areas of stenosis are demonstrated. Previous stents in the superior mesenteric artery origin and in the right external iliac artery. Progression of the old compression fracture of the L1 vertebra. Electronically Signed   By: Lucienne Capers M.D.   On: 01/20/2016 22:19   Ct Cta Abd/pel W/cm &/or W/o Cm  01/20/2016  CLINICAL DATA:  Pt from home c/o 10/10 pain between shoulder blades that started today about 45 min PTA. Pt. Reports vomiting x  1week, hx of HTN, triple bypass surgery, back problems, and no sternum, former smoker, and hyster., 150 ml isovue 370 given-approx. 50 ml infiltrated into left forearm, Dr. Gerilyn Nestle consulted and ER MD, Dr. Joni Fears aware EXAM: CT ANGIOGRAPHY CHEST, ABDOMEN AND PELVIS TECHNIQUE:  Multidetector CT imaging through the chest, abdomen and pelvis was performed using the standard protocol during bolus administration of intravenous contrast. Multiplanar reconstructed images and MIPs were obtained and reviewed to evaluate the vascular anatomy. CT images of the chest were optimized for evaluation of pulmonary arteries. Additional portal venous phase images of the abdomen and pelvis were also obtained. CONTRAST:  150 mL Isovue 370 COMPARISON:  CT abdomen and pelvis 12/07/2015 FINDINGS: CTA CHEST FINDINGS Technically adequate study with good opacification of the central and segmental pulmonary arteries. No focal filling defects. No evidence of significant pulmonary embolus. Postoperative changes in the mediastinum with absence of the sternum and bypass grafting changes. Mild cardiac enlargement with predominant right heart enlargement. There is reflux of contrast material into the IVC and hepatic veins suggesting passive congestion due to right heart failure. Coronary artery calcifications. Calcifications in the thoracic aorta. Aorta and great vessels are patent. Prominent atherosclerotic calcification in the aorta and great vessel origins. Esophagus is decompressed. No significant lymphadenopathy in the chest. Evaluation of lungs is limited due to respiratory motion artifact. There are scattered emphysematous changes in the lungs. Dependent atelectasis in the lung bases. No definite airspace disease or consolidation. No pneumothorax. No pleural effusion. Review of the MIP images confirms the above findings. CTA ABDOMEN AND PELVIS FINDINGS Images obtained during arterial phase of contrast bolus administration demonstrated normal caliber abdominal aorta. Prominent calcific and noncalcific atherosclerotic changes throughout the abdominal aorta. Probable high-grade stenosis of the origins of the iliac arteries bilaterally. The abdominal aorta, celiac axis, superior mesenteric artery, single bilateral  renal arteries, and bilateral iliac, external iliac, and internal iliac arteries are patent. The common femoral and proximal superficial and deep femoral arteries are patent. There appears to be a wall stabbed in the origin of the superior mesenteric artery. Stent is present in the right external iliac artery with partial thrombosis in the stent. Probable high-grade stenosis at the origin of the external iliac artery. Renal nephrograms are symmetrical. Prominent calcification at the origins of the renal arteries. Portal venous phase images demonstrate normal flow to the portal and mesenteric veins. Liver parenchymal echotexture is somewhat heterogeneous likely related to passive congestion. No focal liver lesions are identified. The gallbladder, pancreas, spleen, adrenal glands, kidneys, inferior vena cava, and retroperitoneal lymph nodes are unremarkable. Stomach, small bowel, and colon are not abnormally distended. Scattered stool in the colon. No free air or free fluid in the abdomen. Pelvis: Scattered diverticula in the sigmoid colon without evidence of diverticulitis. Appendix is not identified. Bladder wall is not thickened. Uterus is surgically absent. No free or loculated pelvic fluid collections. No pelvic mass or lymphadenopathy. Anterior compression of the L1 vertebra with sclerosis of the superior endplate and mild retropulsion of fracture fragments. There is about 40% loss of height anteriorly. There is progression since previous CT lumbar spine from 12/17/2015. Review of the MIP images confirms the above findings. IMPRESSION: No evidence of significant pulmonary embolus. No evidence of active pulmonary disease. 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 Extensive atherosclerotic changes throughout the abdominal aorta and mesenteric vessels although vessels remain patent. Multiple focal areas of stenosis are demonstrated. Previous stents in the  superior  mesenteric artery origin and in the right external iliac artery. Progression of the old compression fracture of the L1 vertebra. Electronically Signed   By: Lucienne Capers M.D.   On: 01/20/2016 22:19   US Abdomen Limited Ruq  01/22/2016  CLINICAL DATA:  Upper abdominal pain for 2 months. EXAM: US ABDOMEN LIMITED - RIGHT UPPER QUADRANT COMPARISON:  CTA of the abdomen on 01/20/2016 FINDINGS: Gallbladder: Gallbladder has a normal appearance. Gallbladder wall is 2.4 mm, within normal limits. No stones or pericholecystic fluid. No sonographic Murphy's sign. Common bile duct: Diameter: 5.3 mm Liver: No focal lesion identified. Within normal limits in parenchymal echogenicity. IMPRESSION: Normal evaluation of the right upper quadrant. Electronically Signed   By: Nolon Nations M.D.   On: 01/22/2016 08:48      Impression / Plan:   Bula Jinks is a 55 y.o. y/o female with With nausea vomiting and diarrhea for over a month. The patient was evaluated by Dr. Gustavo Lah and Dr. Candace Cruise in the past. The patient was supposed to have a workup as an outpatient but for a variety of reasons the procedures were canceled.  The patient had an abnormal bilirubin but it was all direct and her liver enzymes were normal indicating this is unlikely a liver issue. The patient will need an EGD and colonoscopy, and Dr. Allen Norris has planned to pursue these on Monday if the pt remains in house at that time. In preparation for these procedures, the pt will need a clear liquid diet on Sunday, and colonoscopy prep. NPO after MN Sunday night.  Thank you for involving me in the care of this patient.       Kirsten Highland, MD, MPH  01/22/2016, 11:38 AM

## 2016-01-22 NOTE — Progress Notes (Addendum)
Pt has declined ice therapy/elevation for pain today.

## 2016-01-22 NOTE — Progress Notes (Signed)
Dr. Verdell Carmine notified of MRI results; no new orders at this time.

## 2016-01-22 NOTE — Progress Notes (Signed)
[facility] [place] NAME: Kirsten Castro    MR#:  [id number]  DATE OF BIRTH:  1961/08/13  SUBJECTIVE:   Pt. Here due to abdominal pain and also back pain.  Still having some lower back pain and also abdominal pain, nausea.  Says she vomited this morning but it was not witnessed.   REVIEW OF SYSTEMS:    Review of Systems  Constitutional: Negative for fever and chills.  HENT: Negative for congestion and tinnitus.   Eyes: Negative for blurred vision and double vision.  Respiratory: Negative for cough, shortness of breath and wheezing.   Cardiovascular: Negative for chest pain, orthopnea and PND.  Gastrointestinal: Positive for nausea and abdominal pain. Negative for vomiting and diarrhea.  Genitourinary: Negative for dysuria and hematuria.  Musculoskeletal: Positive for back pain.  Neurological: Negative for dizziness, sensory change and focal weakness.  All other systems reviewed and are negative.   Nutrition: Clear liquid Tolerating Diet: Yes Tolerating PT: Ambulatory   DRUG ALLERGIES:   Allergies  Allergen Reactions  . Ferrous Sulfate Hives    VITALS:  Blood pressure 140/56, pulse 70, temperature 98 F (36.7 C), temperature source Oral, resp. rate 18, height 5\' 1"  (1.549 m), weight 70.489 kg (155 lb 6.4 oz), SpO2 95 %.  PHYSICAL EXAMINATION:   Physical Exam  GENERAL:  55 y.o.-year-old patient lying in the bed in no acute distress.  EYES: Pupils equal, round, reactive to light and accommodation. No scleral icterus. Extraocular muscles intact.  HEENT: Head atraumatic, normocephalic. Oropharynx and nasopharynx clear.  NECK:  Supple, no jugular venous distention. No thyroid enlargement, no tenderness.  LUNGS: Normal breath sounds bilaterally, no wheezing, rales, rhonchi. No use of accessory muscles of respiration.  CARDIOVASCULAR: S1, S2 normal. No murmurs, rubs, or gallops.  ABDOMEN: Soft, Tender in low abdomen, no  rebound/rigidity, nondistended. Bowel sounds present. No organomegaly or mass.  EXTREMITIES: No cyanosis, clubbing or edema b/l.    NEUROLOGIC: Cranial nerves II through XII are intact. No focal Motor or sensory deficits b/l.   PSYCHIATRIC: The patient is alert and oriented x 3.  SKIN: No obvious rash, lesion, or ulcer.    LABORATORY PANEL:   CBC  Recent Labs Lab 01/20/16 1953  WBC 9.5  HGB 14.7  HCT 44.5  PLT 264   ------------------------------------------------------------------------------------------------------------------  Chemistries   Recent Labs Lab 01/20/16 1953  NA 137  K 4.3  CL 105  CO2 25  GLUCOSE 94  BUN 12  CREATININE 0.62  CALCIUM 9.5  AST 25  ALT 24  ALKPHOS 89  BILITOT 1.3*   ------------------------------------------------------------------------------------------------------------------  Cardiac Enzymes  Recent Labs Lab 01/21/16 0454  TROPONINI <0.03   ------------------------------------------------------------------------------------------------------------------  RADIOLOGY:  Dg Chest 2 View  01/20/2016  CLINICAL DATA:  Acute onset of chest pain, shortness of breath and nausea onset 1 hour prior. EXAM: CHEST  2 VIEW COMPARISON:  Radiographs 12/15/2015, 11/20/2015 FINDINGS: Patient is post CABG with coronary stent. Great vessels stent also unchanged. Cardiomediastinal contours are unchanged, mild cardiomegaly. No pulmonary edema, confluent airspace disease, pleural effusion or pneumothorax. Sternum is absent. No acute osseous abnormality. IMPRESSION: No acute process or change from prior exams. Electronically Signed   By: Jeb Levering M.D.   On: 01/20/2016 19:14   Ct Angio Chest Pe W/cm &/or Wo Cm  01/20/2016  CLINICAL DATA:  Pt from home c/o 10/10 pain between shoulder blades that started today about 45 min PTA. Pt. Reports vomiting x 1week, hx of  HTN, triple bypass surgery, back problems, and no sternum, former smoker, and hyster.,  150 ml isovue 370 given-approx. 50 ml infiltrated into left forearm, Dr. Gerilyn Nestle consulted and ER MD, Dr. Joni Fears aware EXAM: CT ANGIOGRAPHY CHEST, ABDOMEN AND PELVIS TECHNIQUE: Multidetector CT imaging through the chest, abdomen and pelvis was performed using the standard protocol during bolus administration of intravenous contrast. Multiplanar reconstructed images and MIPs were obtained and reviewed to evaluate the vascular anatomy. CT images of the chest were optimized for evaluation of pulmonary arteries. Additional portal venous phase images of the abdomen and pelvis were also obtained. CONTRAST:  150 mL Isovue 370 COMPARISON:  CT abdomen and pelvis 12/07/2015 FINDINGS: CTA CHEST FINDINGS Technically adequate study with good opacification of the central and segmental pulmonary arteries. No focal filling defects. No evidence of significant pulmonary embolus. Postoperative changes in the mediastinum with absence of the sternum and bypass grafting changes. Mild cardiac enlargement with predominant right heart enlargement. There is reflux of contrast material into the IVC and hepatic veins suggesting passive congestion due to right heart failure. Coronary artery calcifications. Calcifications in the thoracic aorta. Aorta and great vessels are patent. Prominent atherosclerotic calcification in the aorta and great vessel origins. Esophagus is decompressed. No significant lymphadenopathy in the chest. Evaluation of lungs is limited due to respiratory motion artifact. There are scattered emphysematous changes in the lungs. Dependent atelectasis in the lung bases. No definite airspace disease or consolidation. No pneumothorax. No pleural effusion. Review of the MIP images confirms the above findings. CTA ABDOMEN AND PELVIS FINDINGS Images obtained during arterial phase of contrast bolus administration demonstrated normal caliber abdominal aorta. Prominent calcific and noncalcific atherosclerotic changes throughout the  abdominal aorta. Probable high-grade stenosis of the origins of the iliac arteries bilaterally. The abdominal aorta, celiac axis, superior mesenteric artery, single bilateral renal arteries, and bilateral iliac, external iliac, and internal iliac arteries are patent. The common femoral and proximal superficial and deep femoral arteries are patent. There appears to be a wall stabbed in the origin of the superior mesenteric artery. Stent is present in the right external iliac artery with partial thrombosis in the stent. Probable high-grade stenosis at the origin of the external iliac artery. Renal nephrograms are symmetrical. Prominent calcification at the origins of the renal arteries. Portal venous phase images demonstrate normal flow to the portal and mesenteric veins. Liver parenchymal echotexture is somewhat heterogeneous likely related to passive congestion. No focal liver lesions are identified. The gallbladder, pancreas, spleen, adrenal glands, kidneys, inferior vena cava, and retroperitoneal lymph nodes are unremarkable. Stomach, small bowel, and colon are not abnormally distended. Scattered stool in the colon. No free air or free fluid in the abdomen. Pelvis: Scattered diverticula in the sigmoid colon without evidence of diverticulitis. Appendix is not identified. Bladder wall is not thickened. Uterus is surgically absent. No free or loculated pelvic fluid collections. No pelvic mass or lymphadenopathy. Anterior compression of the L1 vertebra with sclerosis of the superior endplate and mild retropulsion of fracture fragments. There is about 40% loss of height anteriorly. There is progression since previous CT lumbar spine from 12/17/2015. Review of the MIP images confirms the above findings. IMPRESSION: No evidence of significant pulmonary embolus. No evidence of active pulmonary disease. 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 Extensive atherosclerotic  changes throughout the abdominal aorta and mesenteric vessels although vessels remain patent. Multiple focal areas of stenosis are demonstrated. Previous stents in the superior mesenteric artery origin  and in the right external iliac artery. Progression of the old compression fracture of the L1 vertebra. Electronically Signed   By: Lucienne Capers M.D.   On: 01/20/2016 22:19   Mr Lumbar Spine Wo Contrast  01/22/2016  CLINICAL DATA:  History of fall 4 weeks ago. Low back and abdominal pain. Subsequent encounter. EXAM: MRI LUMBAR SPINE WITHOUT CONTRAST TECHNIQUE: Multiplanar, multisequence MR imaging of the lumbar spine was performed. No intravenous contrast was administered. COMPARISON:  CT lumbar spine 12/17/2015. CT angiogram abdomen and pelvis 01/20/2016. FINDINGS: As on the prior examinations, the patient has a subacute superior endplate compression fracture of L1. Vertebral body height loss is estimated at up to 40% anteriorly. There is some bony retropulsion off the superior endplate. Marrow edema is present within the vertebral body. Vertebral body height and signal are otherwise unremarkable. The conus medullaris is normal in signal and position. Imaged intra-abdominal contents are unremarkable. T11-12: Negative. T12-L1: Mild bony retropulsion off the superior endplate of L1 without central canal or foraminal stenosis. L1-2:  Negative. L2-3:  Negative. L3-4:  Negative. L4-5:  Mild facet degenerative change.  Otherwise negative. L5-S1:  Mild facet degenerative change.  Otherwise negative. IMPRESSION: Subacute L1 superior endplate compression fracture does not appear notably changed since the most recent exam. Mild bony retropulsion off the superior endplate of L1 does not cause central canal stenosis at T12-L1. The examination is otherwise negative. Electronically Signed   By: Inge Rise M.D.   On: 01/22/2016 13:37   Ct Cta Abd/pel W/cm &/or W/o Cm  01/20/2016  CLINICAL DATA:  Pt from home c/o  10/10 pain between shoulder blades that started today about 45 min PTA. Pt. Reports vomiting x 1week, hx of HTN, triple bypass surgery, back problems, and no sternum, former smoker, and hyster., 150 ml isovue 370 given-approx. 50 ml infiltrated into left forearm, Dr. Gerilyn Nestle consulted and ER MD, Dr. Joni Fears aware EXAM: CT ANGIOGRAPHY CHEST, ABDOMEN AND PELVIS TECHNIQUE: Multidetector CT imaging through the chest, abdomen and pelvis was performed using the standard protocol during bolus administration of intravenous contrast. Multiplanar reconstructed images and MIPs were obtained and reviewed to evaluate the vascular anatomy. CT images of the chest were optimized for evaluation of pulmonary arteries. Additional portal venous phase images of the abdomen and pelvis were also obtained. CONTRAST:  150 mL Isovue 370 COMPARISON:  CT abdomen and pelvis 12/07/2015 FINDINGS: CTA CHEST FINDINGS Technically adequate study with good opacification of the central and segmental pulmonary arteries. No focal filling defects. No evidence of significant pulmonary embolus. Postoperative changes in the mediastinum with absence of the sternum and bypass grafting changes. Mild cardiac enlargement with predominant right heart enlargement. There is reflux of contrast material into the IVC and hepatic veins suggesting passive congestion due to right heart failure. Coronary artery calcifications. Calcifications in the thoracic aorta. Aorta and great vessels are patent. Prominent atherosclerotic calcification in the aorta and great vessel origins. Esophagus is decompressed. No significant lymphadenopathy in the chest. Evaluation of lungs is limited due to respiratory motion artifact. There are scattered emphysematous changes in the lungs. Dependent atelectasis in the lung bases. No definite airspace disease or consolidation. No pneumothorax. No pleural effusion. Review of the MIP images confirms the above findings. CTA ABDOMEN AND PELVIS  FINDINGS Images obtained during arterial phase of contrast bolus administration demonstrated normal caliber abdominal aorta. Prominent calcific and noncalcific atherosclerotic changes throughout the abdominal aorta. Probable high-grade stenosis of the origins of the iliac arteries bilaterally. The abdominal aorta, celiac  axis, superior mesenteric artery, single bilateral renal arteries, and bilateral iliac, external iliac, and internal iliac arteries are patent. The common femoral and proximal superficial and deep femoral arteries are patent. There appears to be a wall stabbed in the origin of the superior mesenteric artery. Stent is present in the right external iliac artery with partial thrombosis in the stent. Probable high-grade stenosis at the origin of the external iliac artery. Renal nephrograms are symmetrical. Prominent calcification at the origins of the renal arteries. Portal venous phase images demonstrate normal flow to the portal and mesenteric veins. Liver parenchymal echotexture is somewhat heterogeneous likely related to passive congestion. No focal liver lesions are identified. The gallbladder, pancreas, spleen, adrenal glands, kidneys, inferior vena cava, and retroperitoneal lymph nodes are unremarkable. Stomach, small bowel, and colon are not abnormally distended. Scattered stool in the colon. No free air or free fluid in the abdomen. Pelvis: Scattered diverticula in the sigmoid colon without evidence of diverticulitis. Appendix is not identified. Bladder wall is not thickened. Uterus is surgically absent. No free or loculated pelvic fluid collections. No pelvic mass or lymphadenopathy. Anterior compression of the L1 vertebra with sclerosis of the superior endplate and mild retropulsion of fracture fragments. There is about 40% loss of height anteriorly. There is progression since previous CT lumbar spine from 12/17/2015. Review of the MIP images confirms the above findings. IMPRESSION: No  evidence of significant pulmonary embolus. No evidence of active pulmonary disease. 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 Extensive atherosclerotic changes throughout the abdominal aorta and mesenteric vessels although vessels remain patent. Multiple focal areas of stenosis are demonstrated. Previous stents in the superior mesenteric artery origin and in the right external iliac artery. Progression of the old compression fracture of the L1 vertebra. Electronically Signed   By: Lucienne Capers M.D.   On: 01/20/2016 22:19   US Abdomen Limited Ruq  01/22/2016  CLINICAL DATA:  Upper abdominal pain for 2 months. EXAM: US ABDOMEN LIMITED - RIGHT UPPER QUADRANT COMPARISON:  CTA of the abdomen on 01/20/2016 FINDINGS: Gallbladder: Gallbladder has a normal appearance. Gallbladder wall is 2.4 mm, within normal limits. No stones or pericholecystic fluid. No sonographic Murphy's sign. Common bile duct: Diameter: 5.3 mm Liver: No focal lesion identified. Within normal limits in parenchymal echogenicity. IMPRESSION: Normal evaluation of the right upper quadrant. Electronically Signed   By: Nolon Nations M.D.   On: 01/22/2016 08:48     ASSESSMENT AND PLAN:   55 year old female with past medical history of previous CVA, coronary disease, COPD, hypertension presents to the hospital due to abdominal pain, nausea vomiting and also noted to have back pain.  1. Abdominal pain-patient had a recent hospitalization due to acute colitis which was ischemic in nature and patient is status post angiogram and angioplasty of the SMA and now here w/ pain again.  -  CTA Abdomen/pelvis showing no acute pathology.  - seen by GI and plan for upper GI endoscopy/colonscopy on Monday.  - cont. Supportive care for now.   2. Back pain - likely related to compression fracture.  Subacute in nature.  - will consult Ortho on Monday to see if she needs Kyphoplasty - cont. Supportive  care with pain control  3. Anxiety-continue when necessary Xanax.  4. Hypertension-slightly accelerated.  -Continue Coreg and will advance if needed.  5. COPD-no acute exacerbation. Continue Dulera, Spiriva  6. GERD-continue Protonix.   All the records are reviewed and case discussed  with Care Management/Social Workerr. Management plans discussed with the patient, family and they are in agreement.  CODE STATUS: Full   DVT Prophylaxis: Lovenox   TOTAL TIME TAKING CARE OF THIS PATIENT: 30  minutes.   POSSIBLE D/C IN 1-2  DAYS, DEPENDING ON CLINICAL CONDITION.   Henreitta Leber M.D on 01/22/2016 at 2:01 PM  Between 7am to 6pm - Pager - [phone]  After 6pm go to [email or web address] - password [email or web address]  [facility]  Office  [phone]  CC: Primary care physician; Leonel Ramsay, MD

## 2016-01-23 LAB — CBC
HEMATOCRIT: 42.4 % (ref 35.0–47.0)
Hemoglobin: 14 g/dL (ref 12.0–16.0)
MCH: 33 pg (ref 26.0–34.0)
MCHC: 33.2 g/dL (ref 32.0–36.0)
MCV: 99.5 fL (ref 80.0–100.0)
Platelets: 200 10*3/uL (ref 150–440)
RBC: 4.26 MIL/uL (ref 3.80–5.20)
RDW: 15.3 % — AB (ref 11.5–14.5)
WBC: 7 10*3/uL (ref 3.6–11.0)

## 2016-01-23 LAB — CREATININE, SERUM
Creatinine, Ser: 0.64 mg/dL (ref 0.44–1.00)
GFR calc Af Amer: >60 mL/min (ref >60)
GFR calc non Af Amer: >60 mL/min (ref >60)

## 2016-01-23 MED ORDER — PEG 3350-KCL-NA BICARB-NACL 420 G PO SOLR
4000.0000 mL | Freq: Once | ORAL | Status: AC
  Administered 2016-01-23: 4000 mL via ORAL
  Filled 2016-01-23: qty 4000

## 2016-01-23 NOTE — Progress Notes (Signed)
[facility] [place] NAME: Kirsten Castro    MR#:  [id number]  DATE OF BIRTH:  1961-05-07  SUBJECTIVE:   Pt. Here due to abdominal pain and also back pain.  MRI of the lumbar spine yesterday showing a subacute L1 compression fracture. Patient's abdominal pain and back pain has improved since yesterday. Patient is scheduled for endoscopy/colonoscopy tomorrow.  REVIEW OF SYSTEMS:    Review of Systems  Constitutional: Negative for fever and chills.  HENT: Negative for congestion and tinnitus.   Eyes: Negative for blurred vision and double vision.  Respiratory: Negative for cough, shortness of breath and wheezing.   Cardiovascular: Negative for chest pain, orthopnea and PND.  Gastrointestinal: Positive for abdominal pain. Negative for nausea, vomiting and diarrhea.  Genitourinary: Negative for dysuria and hematuria.  Musculoskeletal: Positive for back pain.  Neurological: Negative for dizziness, sensory change and focal weakness.  All other systems reviewed and are negative.   Nutrition: Clear liquid Tolerating Diet: Yes Tolerating PT: Ambulatory   DRUG ALLERGIES:   Allergies  Allergen Reactions  . Ferrous Sulfate Hives    VITALS:  Blood pressure 156/77, pulse 69, temperature 97.8 F (36.6 C), temperature source Oral, resp. rate 16, height 5\' 1"  (1.549 m), weight 71.804 kg (158 lb 4.8 oz), SpO2 97 %.  PHYSICAL EXAMINATION:   Physical Exam  GENERAL:  55 y.o.-year-old patient lying in the bed in no acute distress.  EYES: Pupils equal, round, reactive to light and accommodation. No scleral icterus. Extraocular muscles intact.  HEENT: Head atraumatic, normocephalic. Oropharynx and nasopharynx clear.  NECK:  Supple, no jugular venous distention. No thyroid enlargement, no tenderness.  LUNGS: Normal breath sounds bilaterally, no wheezing, rales, rhonchi. No use of accessory muscles of respiration.  CARDIOVASCULAR: S1, S2 normal. No  murmurs, rubs, or gallops.  ABDOMEN: Soft, Tender in low abdomen, no rebound/rigidity, nondistended. Bowel sounds present. No organomegaly or mass.  EXTREMITIES: No cyanosis, clubbing or edema b/l.    NEUROLOGIC: Cranial nerves II through XII are intact. No focal Motor or sensory deficits b/l.   PSYCHIATRIC: The patient is alert and oriented x 3.  SKIN: No obvious rash, lesion, or ulcer.    LABORATORY PANEL:   CBC  Recent Labs Lab 01/23/16 0433  WBC 7.0  HGB 14.0  HCT 42.4  PLT 200   ------------------------------------------------------------------------------------------------------------------  Chemistries   Recent Labs Lab 01/20/16 1953 01/23/16 0433  NA 137  --   K 4.3  --   CL 105  --   CO2 25  --   GLUCOSE 94  --   BUN 12  --   CREATININE 0.62 0.64  CALCIUM 9.5  --   AST 25  --   ALT 24  --   ALKPHOS 89  --   BILITOT 1.3*  --    ------------------------------------------------------------------------------------------------------------------  Cardiac Enzymes  Recent Labs Lab 01/21/16 0454  TROPONINI <0.03   ------------------------------------------------------------------------------------------------------------------  RADIOLOGY:  Mr Lumbar Spine Wo Contrast  01/22/2016  CLINICAL DATA:  History of fall 4 weeks ago. Low back and abdominal pain. Subsequent encounter. EXAM: MRI LUMBAR SPINE WITHOUT CONTRAST TECHNIQUE: Multiplanar, multisequence MR imaging of the lumbar spine was performed. No intravenous contrast was administered. COMPARISON:  CT lumbar spine 12/17/2015. CT angiogram abdomen and pelvis 01/20/2016. FINDINGS: As on the prior examinations, the patient has a subacute superior endplate compression fracture of L1. Vertebral body height loss is estimated at up to 40% anteriorly. There is some bony retropulsion off  the superior endplate. Marrow edema is present within the vertebral body. Vertebral body height and signal are otherwise unremarkable.  The conus medullaris is normal in signal and position. Imaged intra-abdominal contents are unremarkable. T11-12: Negative. T12-L1: Mild bony retropulsion off the superior endplate of L1 without central canal or foraminal stenosis. L1-2:  Negative. L2-3:  Negative. L3-4:  Negative. L4-5:  Mild facet degenerative change.  Otherwise negative. L5-S1:  Mild facet degenerative change.  Otherwise negative. IMPRESSION: Subacute L1 superior endplate compression fracture does not appear notably changed since the most recent exam. Mild bony retropulsion off the superior endplate of L1 does not cause central canal stenosis at T12-L1. The examination is otherwise negative. Electronically Signed   By: Inge Rise M.D.   On: 01/22/2016 13:37   US Abdomen Limited Ruq  01/22/2016  CLINICAL DATA:  Upper abdominal pain for 2 months. EXAM: US ABDOMEN LIMITED - RIGHT UPPER QUADRANT COMPARISON:  CTA of the abdomen on 01/20/2016 FINDINGS: Gallbladder: Gallbladder has a normal appearance. Gallbladder wall is 2.4 mm, within normal limits. No stones or pericholecystic fluid. No sonographic Murphy's sign. Common bile duct: Diameter: 5.3 mm Liver: No focal lesion identified. Within normal limits in parenchymal echogenicity. IMPRESSION: Normal evaluation of the right upper quadrant. Electronically Signed   By: Nolon Nations M.D.   On: 01/22/2016 08:48     ASSESSMENT AND PLAN:   55 year old female with past medical history of previous CVA, coronary disease, COPD, hypertension presents to the hospital due to abdominal pain, nausea vomiting and also noted to have back pain.  1. Abdominal pain-patient had a recent hospitalization due to acute colitis which was ischemic in nature and patient is status post angiogram and angioplasty of the SMA and now here w/ pain again.  -  CTA Abdomen/pelvis showing no acute pathology.  - seen by GI and plan for upper GI endoscopy/colonscopy on Monday.  - cont. Supportive care for now.   2.  Back pain - likely related to compression fracture.  Subacute in nature.  -MRI of the lumbar spine did confirm a L1 compression fracture with no central canal stenosis. I will consult orthopedics tomorrow to see if the patient would benefit from kyphoplasty. - cont. Supportive care with pain control  3. Anxiety-continue Xanax.  4. Hypertension-improved since yesterday. -Continue Coreg and will advance if needed.  5. COPD-no acute exacerbation. Continue Dulera, Spiriva  6. GERD-continue Protonix.   All the records are reviewed and case discussed with Care Management/Social Workerr. Management plans discussed with the patient, family and they are in agreement.  CODE STATUS: Full   DVT Prophylaxis: Lovenox   TOTAL TIME TAKING CARE OF THIS PATIENT: 25  minutes.   POSSIBLE D/C IN 1-2  DAYS, DEPENDING ON CLINICAL CONDITION.   Henreitta Leber M.D on 01/23/2016 at 12:06 PM  Between 7am to 6pm - Pager - [phone]  After 6pm go to [email or web address] - password [email or web address] [facility]  [facility] [facility]  Office  [phone]  CC: Primary care physician; Leonel Ramsay, MD

## 2016-01-23 NOTE — Consult Note (Signed)
Consultation  Referring Provider:     No ref. provider found Primary Care Physician:  Leonel Ramsay, MD Primary Gastroenterologist:  Dr. Gustavo Lah Reason for Consultation:     Melena  Date of Admission:  01/20/2016 Date of Follow Up:  01/23/2016         HPI:   Kirsten Castro is a 55 y.o. female who was admitted with a history of multiple medical problems including peripheral vascular disease and cardiac disease. She continues to report abdominal pain, n/v and diarrhea this am.    Review of Systems:    All systems reviewed and negative except where noted in HPI.   Physical Exam:  Vital signs in last 24 hours: Temp:  [97.8 F (36.6 C)-98.1 F (36.7 C)] 97.8 F (36.6 C) (04/23 0607) Pulse Rate:  [69-75] 69 (04/23 0619) Resp:  [16-24] 16 (04/23 0433) BP: (140-188)/(56-98) 170/88 mmHg (04/23 0623) SpO2:  [95 %-99 %] 97 % (04/23 0607) Weight:  [71.804 kg (158 lb 4.8 oz)] 71.804 kg (158 lb 4.8 oz) (04/23 0433) Last BM Date: 01/23/16 General:   Pleasant, cooperative in NAD Head:  Normocephalic and atraumatic. Eyes:   No icterus.   Conjunctiva pink. PERRLA. Ears:  Normal auditory acuity. Neck:  Supple; no masses or thyroidomegaly Lungs: Respirations even and unlabored. Lungs clear to auscultation bilaterally.   No wheezes, crackles, or rhonchi.  Heart:  Regular rate and rhythm;  Without murmur, clicks, rubs or gallops Abdomen:  Soft, nondistended, mildly tender to palpation diffusely. Normal bowel sounds. No appreciable masses or hepatomegaly.  No rebound or guarding.  Msk:  Symmetrical without gross deformities.  Strength  Extremities:  Without edema, cyanosis or clubbing. Neurologic:  Alert and oriented x3;  grossly normal neurologically. Skin:  Intact without significant lesions or rashes. Psych:  Alert and cooperative. Normal affect.  LAB RESULTS:  Recent Labs  01/20/16 1953 01/23/16 0433  WBC 9.5 7.0  HGB 14.7 14.0  HCT 44.5 42.4  PLT 264 200    BMET  Recent Labs  01/20/16 1953 01/23/16 0433  NA 137  --   K 4.3  --   CL 105  --   CO2 25  --   GLUCOSE 94  --   BUN 12  --   CREATININE 0.62 0.64  CALCIUM 9.5  --    LFT  Recent Labs  01/20/16 1953  PROT 6.3*  ALBUMIN 3.8  AST 25  ALT 24  ALKPHOS 89  BILITOT 1.3*  BILIDIR 0.3  IBILI 1.0*   STUDIES: Mr Lumbar Spine Wo Contrast  01/22/2016  CLINICAL DATA:  History of fall 4 weeks ago. Low back and abdominal pain. Subsequent encounter. EXAM: MRI LUMBAR SPINE WITHOUT CONTRAST TECHNIQUE: Multiplanar, multisequence MR imaging of the lumbar spine was performed. No intravenous contrast was administered. COMPARISON:  CT lumbar spine 12/17/2015. CT angiogram abdomen and pelvis 01/20/2016. FINDINGS: As on the prior examinations, the patient has a subacute superior endplate compression fracture of L1. Vertebral body height loss is estimated at up to 40% anteriorly. There is some bony retropulsion off the superior endplate. Marrow edema is present within the vertebral body. Vertebral body height and signal are otherwise unremarkable. The conus medullaris is normal in signal and position. Imaged intra-abdominal contents are unremarkable. T11-12: Negative. T12-L1: Mild bony retropulsion off the superior endplate of L1 without central canal or foraminal stenosis. L1-2:  Negative. L2-3:  Negative. L3-4:  Negative. L4-5:  Mild facet degenerative change.  Otherwise negative. L5-S1:  Mild facet  degenerative change.  Otherwise negative. IMPRESSION: Subacute L1 superior endplate compression fracture does not appear notably changed since the most recent exam. Mild bony retropulsion off the superior endplate of L1 does not cause central canal stenosis at T12-L1. The examination is otherwise negative. Electronically Signed   By: Inge Rise M.D.   On: 01/22/2016 13:37   US Abdomen Limited Ruq  01/22/2016  CLINICAL DATA:  Upper abdominal pain for 2 months. EXAM: US ABDOMEN LIMITED - RIGHT UPPER  QUADRANT COMPARISON:  CTA of the abdomen on 01/20/2016 FINDINGS: Gallbladder: Gallbladder has a normal appearance. Gallbladder wall is 2.4 mm, within normal limits. No stones or pericholecystic fluid. No sonographic Murphy's sign. Common bile duct: Diameter: 5.3 mm Liver: No focal lesion identified. Within normal limits in parenchymal echogenicity. IMPRESSION: Normal evaluation of the right upper quadrant. Electronically Signed   By: Nolon Nations M.D.   On: 01/22/2016 08:48      Impression / Plan:   Kirsten Castro is a 55 y.o. y/o female with With nausea vomiting and diarrhea for over a month. The patient was evaluated by Dr. Gustavo Lah and Dr. Candace Cruise in the past. The patient was supposed to have a workup as an outpatient but for a variety of reasons the procedures were canceled.  The patient had an abnormal bilirubin but it was all direct and her liver enzymes were normal indicating this is unlikely a liver issue. The patient will need an EGD and colonoscopy, and Dr. Allen Norris has planned to pursue these on Monday (tomorrow). In preparation for these procedures, the pt will need a clear liquid diet today, and colonoscopy prep which I have discussed with her nurses. This should begin shortly after lunch time today. NPO after MN Sunday night.  Thank you for involving me in the care of this patient.       Fredonia Highland, MD, MPH  01/23/2016, 10:39 AM

## 2016-01-24 ENCOUNTER — Observation Stay: Payer: BLUE CROSS/BLUE SHIELD | Admitting: Anesthesiology

## 2016-01-24 ENCOUNTER — Encounter: Payer: Self-pay | Admitting: Anesthesiology

## 2016-01-24 ENCOUNTER — Encounter
Admission: EM | Disposition: A | Discharge status: Home / Self Care | Payer: Self-pay | Source: Home / Self Care | Attending: Emergency Medicine

## 2016-01-24 DIAGNOSIS — R112 Nausea with vomiting, unspecified: Secondary | ICD-10-CM

## 2016-01-24 DIAGNOSIS — B3781 Candidal esophagitis: Secondary | ICD-10-CM | POA: Diagnosis not present

## 2016-01-24 DIAGNOSIS — K2970 Gastritis, unspecified, without bleeding: Secondary | ICD-10-CM | POA: Diagnosis not present

## 2016-01-24 DIAGNOSIS — R1084 Generalized abdominal pain: Secondary | ICD-10-CM | POA: Insufficient documentation

## 2016-01-24 DIAGNOSIS — K529 Noninfective gastroenteritis and colitis, unspecified: Secondary | ICD-10-CM | POA: Insufficient documentation

## 2016-01-24 HISTORY — PX: ESOPHAGOGASTRODUODENOSCOPY (EGD) WITH PROPOFOL: SHX5813

## 2016-01-24 HISTORY — PX: COLONOSCOPY WITH PROPOFOL: SHX5780

## 2016-01-24 LAB — KOH PREP

## 2016-01-24 SURGERY — ESOPHAGOGASTRODUODENOSCOPY (EGD) WITH PROPOFOL
Anesthesia: Monitor Anesthesia Care

## 2016-01-24 SURGERY — ESOPHAGOGASTRODUODENOSCOPY (EGD) WITH PROPOFOL
Anesthesia: General

## 2016-01-24 MED ORDER — HYDRALAZINE HCL 20 MG/ML IJ SOLN
10.0000 mg | Freq: Four times a day (QID) | INTRAMUSCULAR | Status: DC | PRN
  Administered 2016-01-24: 10 mg via INTRAVENOUS
  Filled 2016-01-24: qty 1

## 2016-01-24 MED ORDER — PROPOFOL 10 MG/ML IV BOLUS
INTRAVENOUS | Status: DC | PRN
  Administered 2016-01-24: 90 mg via INTRAVENOUS

## 2016-01-24 MED ORDER — NITROGLYCERIN 2 % TD OINT
1.0000 [in_us] | TOPICAL_OINTMENT | Freq: Once | TRANSDERMAL | Status: DC
  Filled 2016-01-24: qty 1

## 2016-01-24 MED ORDER — PROPOFOL 500 MG/50ML IV EMUL
INTRAVENOUS | Status: DC | PRN
  Administered 2016-01-24: 120 ug/kg/min via INTRAVENOUS

## 2016-01-24 MED ORDER — LABETALOL HCL 5 MG/ML IV SOLN
10.0000 mg | Freq: Once | INTRAVENOUS | Status: AC
Start: 2016-01-24 — End: 2016-01-24
  Administered 2016-01-24: 10 mg via INTRAVENOUS
  Filled 2016-01-24: qty 4

## 2016-01-24 MED ORDER — HYDRALAZINE HCL 20 MG/ML IJ SOLN
10.0000 mg | Freq: Once | INTRAMUSCULAR | Status: AC
  Administered 2016-01-24: 10 mg via INTRAVENOUS
  Filled 2016-01-24: qty 1

## 2016-01-24 MED ORDER — SODIUM CHLORIDE 0.9 % IV SOLN
INTRAVENOUS | Status: DC | PRN
  Administered 2016-01-24: 16:00:00 via INTRAVENOUS

## 2016-01-24 MED ORDER — MIDAZOLAM HCL 2 MG/2ML IJ SOLN
INTRAMUSCULAR | Status: DC | PRN
  Administered 2016-01-24: 1 mg via INTRAVENOUS

## 2016-01-24 MED ORDER — FENTANYL CITRATE (PF) 100 MCG/2ML IJ SOLN
INTRAMUSCULAR | Status: DC | PRN
  Administered 2016-01-24: 50 ug via INTRAVENOUS

## 2016-01-24 NOTE — Anesthesia Preprocedure Evaluation (Addendum)
Anesthesia Evaluation  Patient identified by MRN, date of birth, ID band Patient awake    Reviewed: Allergy & Precautions, NPO status , Patient's Chart, lab work & pertinent test results, reviewed documented beta blocker date and time   Airway Mallampati: II  TM Distance: >3 FB     Dental  (+) Chipped, Upper Dentures, Poor Dentition, Missing   Pulmonary COPD, former smoker,           Cardiovascular hypertension, Pt. on medications and Pt. on home beta blockers + angina + CAD, + Past MI, + Cardiac Stents, + CABG and + Peripheral Vascular Disease       Neuro/Psych CVA    GI/Hepatic   Endo/Other    Renal/GU      Musculoskeletal   Abdominal   Peds  Hematology   Anesthesia Other Findings Hypertensive. TIAs in the past.Hx of MI. Stent placed in legs and carotid. 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  Reproductive/Obstetrics                          Anesthesia Physical Anesthesia Plan  ASA: III  Anesthesia Plan: General   Post-op Pain Management:    Induction: Intravenous  Airway Management Planned: Nasal Cannula  Additional Equipment:   Intra-op Plan:   Post-operative Plan:   Informed Consent: I have reviewed the patients History and Physical, chart, labs and discussed the procedure including the risks, benefits and alternatives for the proposed anesthesia with the patient or authorized representative who has indicated his/her understanding and acceptance.     Plan Discussed with: CRNA  Anesthesia Plan Comments:         Anesthesia Quick Evaluation

## 2016-01-24 NOTE — Progress Notes (Signed)
Endo nurse made aware of current BP after medications given.

## 2016-01-24 NOTE — Op Note (Signed)
[facility] Gastroenterology Patient Name: Kirsten Castro Procedure Date: 01/24/2016 4:11 PM MRN: [id number] Account #: [id number] Date of Birth: [birth year] Admit Type: Outpatient Age: 55 Room: [address] [facility] ENDO ROOM 4 Gender: Female Note Status: Finalized Procedure:            Upper GI endoscopy Indications:          Generalized abdominal pain, Nausea with vomiting Providers:            Lucilla Lame, MD Medicines:            Propofol per Anesthesia Complications:        No immediate complications. Procedure:            Pre-Anesthesia Assessment:                       - Prior to the procedure, a History and Physical was                        performed, and patient medications and allergies were                        reviewed. The patient's tolerance of previous                        anesthesia was also reviewed. The risks and benefits of                        the procedure and the sedation options and risks were                        discussed with the patient. All questions were                        answered, and informed consent was obtained. Prior                        Anticoagulants: The patient has taken no previous                        anticoagulant or antiplatelet agents. ASA Grade                        Assessment: II - A patient with mild systemic disease.                        After reviewing the risks and benefits, the patient was                        deemed in satisfactory condition to undergo the                        procedure.                       After obtaining informed consent, the endoscope was                        passed under direct vision. Throughout the procedure,                        the patient's  blood pressure, pulse, and oxygen                        saturations were monitored continuously. The Endoscope                        was introduced through the mouth, and advanced to the                        second part of duodenum.  The upper GI endoscopy was                        accomplished without difficulty. The patient tolerated                        the procedure well. Findings:      Localized candidiasis was found in the lower third of the esophagus.       Cells for cytology were obtained by brushing.      The Z-line was irregular and was found at the gastroesophageal junction.      Diffuse moderate inflammation characterized by erythema was found in the       entire examined stomach. Biopsies were taken with a cold forceps for       histology.      The examined duodenum was normal. Biopsies were taken with a cold       forceps for histology. Impression:           - Monilial esophagitis. Cells for cytology obtained.                       - Z-line irregular, at the gastroesophageal junction.                       - Gastritis. Biopsied.                       - Normal examined duodenum. Biopsied. Recommendation:       - Await pathology results.                       - Perform a colonoscopy today. Procedure Code(s):    --- Professional ---                       [phone], Esophagogastroduodenoscopy, flexible, transoral;                        with biopsy, single or multiple Diagnosis Code(s):    --- Professional ---                       R10.84, Generalized abdominal pain                       R11.2, Nausea with vomiting, unspecified                       K29.70, Gastritis, unspecified, without bleeding                       B37.81, Candidal esophagitis CPT copyright 2016 American Medical Association. All rights reserved. The codes documented in this report are preliminary and upon coder review may  be  revised to meet current compliance requirements. Lucilla Lame, MD 01/24/2016 4:28:25 PM This report has been signed electronically. Number of Addenda: 0 Note Initiated On: 01/24/2016 4:11 PM      [facility]

## 2016-01-24 NOTE — Anesthesia Postprocedure Evaluation (Signed)
Anesthesia Post Note  Patient: Kirsten Castro  Procedure(s) Performed: Procedure(s) (LRB): ESOPHAGOGASTRODUODENOSCOPY (EGD) WITH PROPOFOL (N/A) COLONOSCOPY WITH PROPOFOL (N/A)  Patient location during evaluation: Endoscopy Anesthesia Type: General Level of consciousness: awake and alert Pain management: pain level controlled Vital Signs Assessment: post-procedure vital signs reviewed and stable Respiratory status: spontaneous breathing, nonlabored ventilation, respiratory function stable and patient connected to nasal cannula oxygen Cardiovascular status: blood pressure returned to baseline and stable Postop Assessment: no signs of nausea or vomiting Anesthetic complications: no    Last Vitals:  Filed Vitals:   01/24/16 1720 01/24/16 1738  BP: 169/76 174/71  Pulse: 80 83  Temp:  36.7 C  Resp: 27 16    Last Pain:  Filed Vitals:   01/24/16 1804  PainSc: [place]

## 2016-01-24 NOTE — Plan of Care (Signed)
Problem: Safety: Goal: Ability to remain free from injury will improve Outcome: Not Progressing Pt is adamantly refusing bed alarm.

## 2016-01-24 NOTE — Progress Notes (Signed)
MD paged regarding BP, PRN orders for Hydralazine given.

## 2016-01-24 NOTE — Transfer of Care (Signed)
Immediate Anesthesia Transfer of Care Note  Patient: Kirsten Castro  Procedure(s) Performed: Procedure(s): ESOPHAGOGASTRODUODENOSCOPY (EGD) WITH PROPOFOL (N/A) COLONOSCOPY WITH PROPOFOL (N/A)  Patient Location: PACU and Endoscopy Unit  Anesthesia Type:General  Level of Consciousness: patient cooperative and lethargic  Airway & Oxygen Therapy: Patient Spontanous Breathing and Patient connected to nasal cannula oxygen  Post-op Assessment: Report given to RN and Post -op Vital signs reviewed and stable  Post vital signs: Reviewed and stable  Last Vitals:  Filed Vitals:   01/24/16 1455 01/24/16 1645  BP: 132/61 160/77  Pulse: 62 71  Temp: 36.3 C   Resp: 20 28    Complications: No apparent anesthesia complications

## 2016-01-24 NOTE — Op Note (Signed)
[facility] Gastroenterology Patient Name: Kirsten Castro Procedure Date: 01/24/2016 4:09 PM MRN: [id number] Account #: [id number] Date of Birth: 24-May-1961 Admit Type: Inpatient Age: 55 Room: [facility] ENDO ROOM 4 Gender: Female Note Status: Finalized Procedure:            Colonoscopy Indications:          Chronic diarrhea Providers:            Lucilla Lame, MD Medicines:            Propofol per Anesthesia Complications:        No immediate complications. Procedure:            Pre-Anesthesia Assessment:                       - Prior to the procedure, a History and Physical was                        performed, and patient medications and allergies were                        reviewed. The patient's tolerance of previous                        anesthesia was also reviewed. The risks and benefits of                        the procedure and the sedation options and risks were                        discussed with the patient. All questions were                        answered, and informed consent was obtained. Prior                        Anticoagulants: The patient has taken no previous                        anticoagulant or antiplatelet agents. ASA Grade                        Assessment: II - A patient with mild systemic disease.                        After reviewing the risks and benefits, the patient was                        deemed in satisfactory condition to undergo the                        procedure.                       After obtaining informed consent, the colonoscope was                        passed under direct vision. Throughout the procedure,                        the patient's blood pressure, pulse, and oxygen  saturations were monitored continuously. The                        Colonoscope was introduced through the anus and                        advanced to the the terminal ileum. The colonoscopy was    performed without difficulty. The patient tolerated the                        procedure well. The quality of the bowel preparation                        was excellent. Findings:      The perianal and digital rectal examinations were normal.      The terminal ileum appeared normal. Biopsies were taken with a cold       forceps for histology.      The colon (entire examined portion) appeared normal. Biopsies were taken       with a cold forceps for histology. Impression:           - The examined portion of the ileum was normal.                        Biopsied.                       - The entire examined colon is normal. Biopsied. Recommendation:       - Return patient to hospital ward for ongoing care. Procedure Code(s):    --- Professional ---                       [phone], Colonoscopy, flexible; with biopsy, single or                        multiple Diagnosis Code(s):    --- Professional ---                       K52.9, Noninfective gastroenteritis and colitis,                        unspecified CPT copyright 2016 American Medical Association. All rights reserved. The codes documented in this report are preliminary and upon coder review may  be revised to meet current compliance requirements. Lucilla Lame, MD 01/24/2016 4:40:51 PM This report has been signed electronically. Number of Addenda: 0 Note Initiated On: 01/24/2016 4:09 PM Scope Withdrawal Time: 0 hours 4 minutes 57 seconds  Total Procedure Duration: 0 hours 6 minutes 50 seconds       [facility]

## 2016-01-24 NOTE — Consult Note (Signed)
Patient was seen for evaluation of L1 compression fracture. She suffered a fall about 4 weeks ago she was feeling dizzy and landed directly on her bottom has been having back pain since then. She was admitted with nausea and vomiting also having some significant hypertension today.  She denies numbness or tingling down the leg but she does say she has some pain that runs around both sides of the lower ribs. She also complains of pain in the low back.   On exam she has tenderness to percussion around L1. She has no clonus or neuro deficit lower extremity.  MRI was reviewed showing subacute L1 compression fracture with significant anterior compression minimal retropulsion. This fits her history of fall 4 weeks ago  Impression L1 compression fracture with additional lower back pain  Recommendation: She has after she has her GI workup and blood pressure is under control she could consider kyphoplasty procedure was discussed with her in detail with a bone model and she was given a brochure as well. I think it would give relief of the fracture site not certain about the pain in the lower back is may be referred pain from the L1 fracture may just be more common low back pain. I discussed this with her and we'll continue to follow here in the hospital. If she is discharged I will have appointment made to see her next week as an outpatient

## 2016-01-24 NOTE — Progress Notes (Signed)
[place] [place] [facility] [place] NAME: Kirsten Castro    MR#:  [id number]  DATE OF BIRTH:  05-07-1961  SUBJECTIVE:   Pt. Here due to abdominal pain and also back pain.  MRI of the lumbar spine showing a subacute L1 compression fracture. Patient is scheduled for endoscopy/colonoscopy today. Blood pressure significantly uncontrolled today but she is asymptomatic.  REVIEW OF SYSTEMS:    Review of Systems  Constitutional: Negative for fever and chills.  HENT: Negative for congestion and tinnitus.   Eyes: Negative for blurred vision and double vision.  Respiratory: Negative for cough, shortness of breath and wheezing.   Cardiovascular: Negative for chest pain, orthopnea and PND.  Gastrointestinal: Positive for abdominal pain. Negative for nausea, vomiting and diarrhea.  Genitourinary: Negative for dysuria and hematuria.  Musculoskeletal: Positive for back pain.  Neurological: Negative for dizziness, sensory change and focal weakness.  All other systems reviewed and are negative.   Nutrition: Nothing by mouth for possible colonoscopy/endoscopy Tolerating Diet: No Tolerating PT: Ambulatory   DRUG ALLERGIES:   Allergies  Allergen Reactions  . Ferrous Sulfate Hives    VITALS:  Blood pressure 196/100, pulse 73, temperature 98 F (36.7 C), temperature source Oral, resp. rate 16, height 5\' 1"  (1.549 m), weight 70.625 kg (155 lb 11.2 oz), SpO2 97 %.  PHYSICAL EXAMINATION:   Physical Exam  GENERAL:  55 y.o.-year-old patient lying in the bed in no acute distress.  EYES: Pupils equal, round, reactive to light and accommodation. No scleral icterus. Extraocular muscles intact.  HEENT: Head atraumatic, normocephalic. Oropharynx and nasopharynx clear.  NECK:  Supple, no jugular venous distention. No thyroid enlargement, no tenderness.  LUNGS: Normal breath sounds bilaterally, no wheezing, rales, rhonchi. No use of accessory muscles of respiration.   CARDIOVASCULAR: S1, S2 normal. No murmurs, rubs, or gallops.  ABDOMEN: Soft, Tender in low abdomen, no rebound/rigidity, nondistended. Bowel sounds present. No organomegaly or mass.  EXTREMITIES: No cyanosis, clubbing or edema b/l.    NEUROLOGIC: Cranial nerves II through XII are intact. No focal Motor or sensory deficits b/l.   PSYCHIATRIC: The patient is alert and oriented x 3.  SKIN: No obvious rash, lesion, or ulcer.    LABORATORY PANEL:   CBC  Recent Labs Lab 01/23/16 0433  WBC 7.0  HGB 14.0  HCT 42.4  PLT 200   ------------------------------------------------------------------------------------------------------------------  Chemistries   Recent Labs Lab 01/20/16 1953 01/23/16 0433  NA 137  --   K 4.3  --   CL 105  --   CO2 25  --   GLUCOSE 94  --   BUN 12  --   CREATININE 0.62 0.64  CALCIUM 9.5  --   AST 25  --   ALT 24  --   ALKPHOS 89  --   BILITOT 1.3*  --    ------------------------------------------------------------------------------------------------------------------  Cardiac Enzymes  Recent Labs Lab 01/21/16 0454  TROPONINI <0.03   ------------------------------------------------------------------------------------------------------------------  RADIOLOGY:  No results found.   ASSESSMENT AND PLAN:   55 year old female with past medical history of previous CVA, coronary disease, COPD, hypertension presents to the hospital due to abdominal pain, nausea vomiting and also noted to have back pain.  1. Abdominal pain-patient had a recent hospitalization due to acute colitis which was ischemic in nature and patient is status post angiogram and angioplasty of the SMA and now here w/ pain again.  -  CTA Abdomen/pelvis during this hospitalization showing no acute pathology.  - seen by  GI and plan for upper GI endoscopy/colonscopy today.  - cont. Supportive care for now.   2. Accelerated hypertension-patient's blood pressure is been  significantly uncontrolled today but she is clinically asymptomatic. -DC IV fluids. Patient has been given IV hydralazine, IV labetalol with little improvement. Place intervention nitro paste and continue with an additional dose of hydralazine and follow hemodynamics. -If not improving would consider placing the patient on a nitroglycerin drip.  2. Back pain - likely related to compression fracture.  Subacute in nature.  -MRI of the lumbar spine did confirm a L1 compression fracture with no central canal stenosis. -Appreciate orthopedic input if blood pressure is improved and no acute GI issues after colonoscopy endoscopy possible plan for kyphoplasty if patient is in agreement. Otherwise follow-up with orthopedics as outpatient. - cont. Supportive care with pain control  3. Anxiety-continue Xanax.  4. COPD-no acute exacerbation. Continue Dulera, Spiriva  5. GERD-continue Protonix.   All the records are reviewed and case discussed with Care Management/Social Workerr. Management plans discussed with the patient, family and they are in agreement.  CODE STATUS: Full   DVT Prophylaxis: Lovenox   TOTAL TIME TAKING CARE OF THIS PATIENT: 30 minutes.   POSSIBLE D/C IN 1-2  DAYS, DEPENDING ON CLINICAL CONDITION.   Henreitta Leber M.D on 01/24/2016 at 2:10 PM  Between 7am to 6pm - Pager - [phone]  After 6pm go to [email or web address] - password [email or web address] [facility]  [place] [facility]  Office  [phone]  CC: Primary care physician; Leonel Ramsay, MD

## 2016-01-25 LAB — CALCITRIOL (1,25 DI-OH VIT D): Vit D, 1,25-Dihydroxy: 162 pg/mL — ABNORMAL HIGH (ref 19.9–79.3)

## 2016-01-25 MED ORDER — PANTOPRAZOLE SODIUM 40 MG PO TBEC: 40.0000 mg | DELAYED_RELEASE_TABLET | Freq: Two times a day (BID) | ORAL | Status: AC

## 2016-01-25 MED ORDER — LISINOPRIL 10 MG PO TABS: 10.0000 mg | ORAL_TABLET | Freq: Every day | ORAL | Status: AC

## 2016-01-25 MED ORDER — HYDROCODONE-ACETAMINOPHEN 5-325 MG PO TABS: 1.0000 | ORAL_TABLET | Freq: Four times a day (QID) | ORAL | Status: DC | PRN

## 2016-01-25 NOTE — Progress Notes (Signed)
Patient discharged to home as ordered. Patient given follow up appointment with Dr. Ola Spurr 02/02/2016 at 1030 am., and Dr. Rudene Christians on 01/31/2016. Prescriptions given as ordered. Patient is alert and oriented ambulates well with out assistance. Patient denies pain at this time

## 2016-01-25 NOTE — Discharge Summary (Signed)
[facility] NAME: Kirsten Castro    MR#:  [id number]  DATE OF BIRTH:  [birth year]  DATE OF ADMISSION:  01/20/2016 ADMITTING PHYSICIAN: Harrie Foreman, MD  DATE OF DISCHARGE: 01/25/2016 11:43 AM  PRIMARY CARE PHYSICIAN: Leonel Ramsay, MD    ADMISSION DIAGNOSIS:  Pleuritic chest pain [R07.81]  DISCHARGE DIAGNOSIS:  Active Problems:   Intractable pain   Upper abdominal pain   Nausea and vomiting in adult   Abdominal pain, generalized   Nausea with vomiting   Gastritis   Esophageal candidiasis (HCC)   Noninfectious diarrhea   SECONDARY DIAGNOSIS:   Past Medical History  Diagnosis Date  . Stroke ([place])   . Coronary artery disease   . COPD (chronic obstructive pulmonary disease) ([place])   . Myocardial infarction ([place])   . Anginal pain ([place])   . Hypertension   . Cancer ([place])     melanoma skin cancer    HOSPITAL COURSE:   55 year old female with past medical history of previous CVA, coronary disease, COPD, hypertension presents to the hospital due to abdominal pain, nausea vomiting and also noted to have back pain.  1. Abdominal pain-patient was admitted to the hospital due to worsening abdominal pain, diarrhea. While in the hospital she continued to have abdominal pain but no acute diarrhea.  -patient has had a recent hospitalization due to acute colitis which was ischemic in nature and patient is status post angiogram and angioplasty of the SMA. There was no evidence of ischemic colitis during this hospitalization. Patient had a CT scan of the abdomen pelvis on admission which showed no evidence of acute intra-abdominal pathology. -Patient was seen by gastroenterology and underwent upper GI endoscopy/colonoscopy which only showed mild monilial esophagitis but no evidence of acute colonic or stomach pathology. -Patient now is being discharged on oral PPI twice a day along with nystatin for the monilial  esophagitis.  2. Accelerated hypertension-patient had some episodes of significantly elevated blood pressure 24 hours prior to discharge. She was given IV hydralazine, labetalol with some improvement in hemodynamics. -Her blood pressure has improved and she will continue her Coreg, and a small dose of oral lisinopril was added to her antihypertensive regimen.  3. Back pain - this is due to a subacute lumbar compression fracture. -MRI of the lumbar spine did confirmed a L1 compression fracture with no central canal stenosis. -Patient was seen by orthopedics and will follow-up with the patient is an outpatient for need for kyphoplasty. -Patient has high drug seeking behavior therefore was given limited amounts of narcotics upon discharge.  4. Anxiety-she will continue Xanax.  5. COPD-no acute exacerbation. She will Continue Dulera, Spiriva  6. GERD- she will continue her PPI twice a day.  DISCHARGE CONDITIONS:   Stable  CONSULTS OBTAINED:  Treatment Team:  Lucilla Lame, MD Hessie Knows, MD  DRUG ALLERGIES:   Allergies  Allergen Reactions  . Ferrous Sulfate Hives    DISCHARGE MEDICATIONS:   Discharge Medication List as of 01/25/2016 10:43 AM    START taking these medications   Details  HYDROcodone-acetaminophen (NORCO) 5-325 MG tablet Take 1 tablet by mouth every 6 (six) hours as needed for moderate pain., Starting 01/25/2016, Until Discontinued, Print    lisinopril (ZESTRIL) 10 MG tablet Take 1 tablet (10 mg total) by mouth daily., Starting 01/25/2016, Until Discontinued, Print    pantoprazole (PROTONIX) 40 MG tablet Take 1 tablet (40 mg total) by mouth 2 (two) times daily., Starting  01/25/2016, Until Discontinued, Print      CONTINUE these medications which have NOT CHANGED   Details  acetaminophen (TYLENOL) 500 MG tablet Take 1,000 mg by mouth every 6 (six) hours as needed for mild pain or headache., Until Discontinued, Historical Med    ALPRAZolam (XANAX) 1 MG tablet Take  1 mg by mouth 3 (three) times daily., Until Discontinued, Historical Med    aspirin 81 MG EC tablet Take 81 mg by mouth daily. , Until Discontinued, Historical Med    atorvastatin (LIPITOR) 80 MG tablet Take 80 mg by mouth daily., Until Discontinued, Historical Med    carvedilol (COREG) 12.5 MG tablet Take 12.5 mg by mouth 2 (two) times daily with a meal., Until Discontinued, Historical Med    cetirizine (ZYRTEC) 10 MG tablet Take 10 mg by mouth daily., Until Discontinued, Historical Med    Fluticasone-Salmeterol (ADVAIR) 250-50 MCG/DOSE AEPB Inhale 1 puff into the lungs 2 (two) times daily., Until Discontinued, Historical Med    Magnesium 250 MG TABS Take 250 mg by mouth 2 (two) times daily., Until Discontinued, Historical Med    potassium chloride (K-DUR,KLOR-CON) 10 MEQ tablet Take 10 mEq by mouth 2 (two) times daily., Until Discontinued, Historical Med    traMADol (ULTRAM) 50 MG tablet Take 50 mg by mouth every 6 (six) hours as needed for moderate pain., Until Discontinued, Historical Med    traZODone (DESYREL) 50 MG tablet Take 50 mg by mouth at bedtime as needed for sleep. , Until Discontinued, Historical Med      STOP taking these medications     esomeprazole (NEXIUM) 20 MG capsule      clotrimazole-betamethasone (LOTRISONE) cream      Tiotropium Bromide Monohydrate (SPIRIVA RESPIMAT) 2.5 MCG/ACT AERS          DISCHARGE INSTRUCTIONS:   DIET:  Cardiac diet  DISCHARGE CONDITION:  Stable  ACTIVITY:  Activity as tolerated  OXYGEN:  Home Oxygen: No.   Oxygen Delivery: room air  DISCHARGE LOCATION:  home   If you experience worsening of your admission symptoms, develop shortness of breath, life threatening emergency, suicidal or homicidal thoughts you must seek medical attention immediately by calling 911 or calling your MD immediately  if symptoms less severe.  You Must read complete instructions/literature along with all the possible adverse reactions/side  effects for all the Medicines you take and that have been prescribed to you. Take any new Medicines after you have completely understood and accpet all the possible adverse reactions/side effects.   Please note  You were cared for by a hospitalist during your hospital stay. If you have any questions about your discharge medications or the care you received while you were in the hospital after you are discharged, you can call the unit and asked to speak with the hospitalist on call if the hospitalist that took care of you is not available. Once you are discharged, your primary care physician will handle any further medical issues. Please note that NO REFILLS for any discharge medications will be authorized once you are discharged, as it is imperative that you return to your primary care physician (or establish a relationship with a primary care physician if you do not have one) for your aftercare needs so that they can reassess your need for medications and monitor your lab values.     Today   Abdominal pain improved. Tolerated a soft diet well this morning. No diarrhea presently. Still having some back pain.  VITAL SIGNS:  Blood pressure  160/90, pulse 80, temperature 97.7 F (36.5 C), temperature source Oral, resp. rate 20, height 5\' 1"  (1.549 m), weight 69.264 kg (152 lb 11.2 oz), SpO2 95 %.  I/O:   Intake/Output Summary (Last 24 hours) at 01/25/16 1512 Last data filed at 01/25/16 1011  Gross per 24 hour  Intake    660 ml  Output    675 ml  Net    -15 ml    PHYSICAL EXAMINATION:  GENERAL:  55 y.o.-year-old patient lying in the bed with no acute distress.  EYES: Pupils equal, round, reactive to light and accommodation. No scleral icterus. Extraocular muscles intact.  HEENT: Head atraumatic, normocephalic. Oropharynx and nasopharynx clear.  NECK:  Supple, no jugular venous distention. No thyroid enlargement, no tenderness.  LUNGS: Normal breath sounds bilaterally, no wheezing,  rales,rhonchi. No use of accessory muscles of respiration.  CARDIOVASCULAR: S1, S2 normal. No murmurs, rubs, or gallops.  ABDOMEN: Soft, non-tender, non-distended. Bowel sounds present. No organomegaly or mass.  EXTREMITIES: No pedal edema, cyanosis, or clubbing.  NEUROLOGIC: Cranial nerves II through XII are intact. No focal motor or sensory defecits b/l.  PSYCHIATRIC: The patient is alert and oriented x 3. Good affect.  SKIN: No obvious rash, lesion, or ulcer.   DATA REVIEW:   CBC  Recent Labs Lab 01/23/16 0433  WBC 7.0  HGB 14.0  HCT 42.4  PLT 200    Chemistries   Recent Labs Lab 01/20/16 1953 01/23/16 0433  NA 137  --   K 4.3  --   CL 105  --   CO2 25  --   GLUCOSE 94  --   BUN 12  --   CREATININE 0.62 0.64  CALCIUM 9.5  --   AST 25  --   ALT 24  --   ALKPHOS 89  --   BILITOT 1.3*  --     Cardiac Enzymes  Recent Labs Lab 01/21/16 0454  TROPONINI <0.03    Microbiology Results  Results for orders placed or performed during the hospital encounter of 01/20/16  KOH prep     Status: None   Collection Time: 01/24/16  4:21 PM  Result Value Ref Range Status   Specimen Description BIOPSY  Final   Special Requests NONE  Final   KOH Prep BUDDING YEAST SEEN HYPHAL ELEMENTS SEEN   Final   Report Status 01/24/2016 FINAL  Final    RADIOLOGY:  No results found.    Management plans discussed with the patient, family and they are in agreement.  CODE STATUS:  Code Status History    Date Active Date Inactive Code Status Order ID Comments User Context   01/21/2016  3:11 AM 01/25/2016  2:59 PM Full Code [id number]  Harrie Foreman, MD Inpatient   12/15/2015 10:44 PM 12/18/2015  7:56 PM Full Code [id number]  Vaughan Basta, MD Inpatient   02/10/2015 12:02 PM 02/12/2015  7:12 PM Full Code [id number]  Algernon Huxley, MD Inpatient      TOTAL TIME TAKING CARE OF THIS PATIENT: 40 minutes.    Henreitta Leber M.D on 01/25/2016 at 3:12 PM  Between 7am to 6pm - Pager  - [phone]  After 6pm go to [email or web address] - password [email or web address] [facility]  [place] [facility]  Office  [phone]  CC: Primary care physician; Leonel Ramsay, MD

## 2016-01-26 ENCOUNTER — Encounter: Payer: Self-pay | Admitting: Gastroenterology

## 2016-01-26 LAB — SURGICAL PATHOLOGY

## 2016-02-01 ENCOUNTER — Telehealth: Payer: Self-pay

## 2016-02-01 NOTE — Telephone Encounter (Signed)
-----   Message from Lucilla Lame, MD sent at 01/27/2016 11:49 AM EDT ----- Let the patient know that the biopsies did not show any cause for her symptoms. If she continues to have symptoms please have her follow-up in the office with me.

## 2016-02-01 NOTE — Telephone Encounter (Signed)
Left results with husband as pt was not available to talk. She will call back later today to schedule follow up. Still having issues.

## 2016-02-03 ENCOUNTER — Ambulatory Visit (INDEPENDENT_AMBULATORY_CARE_PROVIDER_SITE_OTHER): Payer: BLUE CROSS/BLUE SHIELD | Admitting: Gastroenterology

## 2016-02-03 ENCOUNTER — Other Ambulatory Visit: Payer: Self-pay

## 2016-02-03 ENCOUNTER — Encounter: Payer: Self-pay | Admitting: Gastroenterology

## 2016-02-03 VITALS — BP 130/79 | HR 82 | Temp 98.7°F | Ht 61.0 in | Wt 150.0 lb

## 2016-02-03 DIAGNOSIS — I10 Essential (primary) hypertension: Secondary | ICD-10-CM | POA: Insufficient documentation

## 2016-02-03 DIAGNOSIS — F32A Depression, unspecified: Secondary | ICD-10-CM | POA: Insufficient documentation

## 2016-02-03 DIAGNOSIS — K5900 Constipation, unspecified: Secondary | ICD-10-CM

## 2016-02-03 DIAGNOSIS — F419 Anxiety disorder, unspecified: Secondary | ICD-10-CM | POA: Insufficient documentation

## 2016-02-03 DIAGNOSIS — I6782 Cerebral ischemia: Secondary | ICD-10-CM | POA: Insufficient documentation

## 2016-02-03 DIAGNOSIS — I25810 Atherosclerosis of coronary artery bypass graft(s) without angina pectoris: Secondary | ICD-10-CM

## 2016-02-03 DIAGNOSIS — I739 Peripheral vascular disease, unspecified: Secondary | ICD-10-CM | POA: Insufficient documentation

## 2016-02-03 DIAGNOSIS — I6529 Occlusion and stenosis of unspecified carotid artery: Secondary | ICD-10-CM | POA: Insufficient documentation

## 2016-02-03 DIAGNOSIS — E785 Hyperlipidemia, unspecified: Secondary | ICD-10-CM | POA: Insufficient documentation

## 2016-02-03 DIAGNOSIS — G939 Disorder of brain, unspecified: Secondary | ICD-10-CM | POA: Insufficient documentation

## 2016-02-03 DIAGNOSIS — F329 Major depressive disorder, single episode, unspecified: Secondary | ICD-10-CM | POA: Insufficient documentation

## 2016-02-03 DIAGNOSIS — C801 Malignant (primary) neoplasm, unspecified: Secondary | ICD-10-CM | POA: Insufficient documentation

## 2016-02-03 HISTORY — DX: Atherosclerosis of coronary artery bypass graft(s) without angina pectoris: I25.810

## 2016-02-03 HISTORY — DX: Essential (primary) hypertension: I10

## 2016-02-03 HISTORY — DX: Cerebral ischemia: I67.82

## 2016-02-03 HISTORY — DX: Peripheral vascular disease, unspecified: I73.9

## 2016-02-03 NOTE — Progress Notes (Signed)
Primary Care Physician: Leonel Ramsay, MD  Primary Gastroenterologist:  Dr. Lucilla Lame  Chief Complaint  Patient presents with  . Follow up procedure results    colon/egd 01/24/16  . Diarrhea    HPI: Kirsten Castro is a 55 y.o. female here For follow-up after being discharged from the hospital. The patient was told on discharge that she should take Imodium for her diarrhea. The patient had not had any diarrhea since being discharged but started taking Imodium 2 days ago. The patient also ran out of her MiraLAX which she had taken the past for constipation. The patient now comes in with abdominal discomfort and a tense abdomen as reported by her with inability to pass her bowels. She was also on chronic pain medication although she states she hasn't taken any in the last few days. She had a colonoscopy for diarrhea by me that did not show any abnormalities of the small bowel or colon on visualization or biopsies.  Current Outpatient Prescriptions  Medication Sig Dispense Refill  . acetaminophen (TYLENOL) 500 MG tablet Take 1,000 mg by mouth every 6 (six) hours as needed for mild pain or headache.    . ALPRAZolam (XANAX) 1 MG tablet Take 1 mg by mouth 3 (three) times daily.    Marland Kitchen aspirin 81 MG EC tablet Take 81 mg by mouth daily.     Marland Kitchen atorvastatin (LIPITOR) 80 MG tablet Take 80 mg by mouth daily.    . carvedilol (COREG) 12.5 MG tablet Take 12.5 mg by mouth 2 (two) times daily with a meal.    . cetirizine (ZYRTEC) 10 MG tablet Take 10 mg by mouth daily.    . clopidogrel (PLAVIX) 75 MG tablet     . clotrimazole (LOTRIMIN) 1 % cream APP EXT AA BID  0  . fenofibrate (TRICOR) 145 MG tablet Take by mouth.    . Fluticasone-Salmeterol (ADVAIR) 250-50 MCG/DOSE AEPB Inhale 1 puff into the lungs 2 (two) times daily.    . furosemide (LASIX) 40 MG tablet Take by mouth.    Marland Kitchen lisinopril (ZESTRIL) 10 MG tablet Take 1 tablet (10 mg total) by mouth daily. 60 tablet 1  . Magnesium 250 MG TABS  Take 250 mg by mouth 2 (two) times daily.    . ondansetron (ZOFRAN-ODT) 4 MG disintegrating tablet   0  . potassium chloride (K-DUR,KLOR-CON) 10 MEQ tablet Take 10 mEq by mouth 2 (two) times daily.    . traZODone (DESYREL) 50 MG tablet Take 50 mg by mouth at bedtime as needed for sleep.     Marland Kitchen HYDROcodone-acetaminophen (NORCO) 5-325 MG tablet Take 1 tablet by mouth every 6 (six) hours as needed for moderate pain. (Patient not taking: Reported on 02/03/2016) 15 tablet 0  . lisinopril-hydrochlorothiazide (PRINZIDE,ZESTORETIC) 10-12.5 MG tablet Reported on 02/03/2016  3  . Oxycodone HCl 20 MG TABS Reported on 02/03/2016  0  . oxyCODONE-acetaminophen (PERCOCET/ROXICET) 5-325 MG tablet Reported on 02/03/2016  0  . pantoprazole (PROTONIX) 40 MG tablet Take 1 tablet (40 mg total) by mouth 2 (two) times daily. (Patient not taking: Reported on 02/03/2016) 60 tablet 1  . traMADol (ULTRAM) 50 MG tablet Take 50 mg by mouth every 6 (six) hours as needed for moderate pain. Reported on 02/03/2016     No current facility-administered medications for this visit.    Allergies as of 02/03/2016 - Review Complete 02/03/2016  Allergen Reaction Noted  . Ferrous sulfate Hives 12/15/2015    ROS:  General: Negative for  anorexia, weight loss, fever, chills, fatigue, weakness. ENT: Negative for hoarseness, difficulty swallowing , nasal congestion. CV: Negative for chest pain, angina, palpitations, dyspnea on exertion, peripheral edema.  Respiratory: Negative for dyspnea at rest, dyspnea on exertion, cough, sputum, wheezing.  GI: See history of present illness. GU:  Negative for dysuria, hematuria, urinary incontinence, urinary frequency, nocturnal urination.  Endo: Negative for unusual weight change.    Physical Examination:   BP 130/79 mmHg  Pulse 82  Temp(Src) 98.7 F (37.1 C) (Oral)  Ht 5\' 1"  (1.549 m)  Wt 150 lb (68.04 kg)  BMI 28.36 kg/m2  General: Well-nourished, well-developed in no acute distress.  Eyes: No  icterus. Conjunctivae pink. Mouth: Oropharyngeal mucosa moist and pink , no lesions erythema or exudate. Lungs: Clear to auscultation bilaterally. Non-labored. Heart: Regular rate and rhythm, no murmurs rubs or gallops.  Abdomen: Bowel sounds are normal, nontender, nondistended, no hepatosplenomegaly or masses, no abdominal bruits or hernia , no rebound or guarding.   Extremities: No lower extremity edema. No clubbing or deformities. Neuro: Alert and oriented x 3.  Grossly intact. Skin: Warm and dry, no jaundice.   Psych: Alert and cooperative, normal mood and affect.  Labs:    Imaging Studies: Dg Chest 2 View  01/20/2016  CLINICAL DATA:  Acute onset of chest pain, shortness of breath and nausea onset 1 hour prior. EXAM: CHEST  2 VIEW COMPARISON:  Radiographs 12/15/2015, 11/20/2015 FINDINGS: Patient is post CABG with coronary stent. Great vessels stent also unchanged. Cardiomediastinal contours are unchanged, mild cardiomegaly. No pulmonary edema, confluent airspace disease, pleural effusion or pneumothorax. Sternum is absent. No acute osseous abnormality. IMPRESSION: No acute process or change from prior exams. Electronically Signed   By: Jeb Levering M.D.   On: 01/20/2016 19:14   Ct Angio Chest Pe W/cm &/or Wo Cm  01/20/2016  CLINICAL DATA:  Pt from home c/o 10/10 pain between shoulder blades that started today about 45 min PTA. Pt. Reports vomiting x 1week, hx of HTN, triple bypass surgery, back problems, and no sternum, former smoker, and hyster., 150 ml isovue 370 given-approx. 50 ml infiltrated into left forearm, Dr. Gerilyn Nestle consulted and ER MD, Dr. Joni Fears aware EXAM: CT ANGIOGRAPHY CHEST, ABDOMEN AND PELVIS TECHNIQUE: Multidetector CT imaging through the chest, abdomen and pelvis was performed using the standard protocol during bolus administration of intravenous contrast. Multiplanar reconstructed images and MIPs were obtained and reviewed to evaluate the vascular anatomy. CT images of  the chest were optimized for evaluation of pulmonary arteries. Additional portal venous phase images of the abdomen and pelvis were also obtained. CONTRAST:  150 mL Isovue 370 COMPARISON:  CT abdomen and pelvis 12/07/2015 FINDINGS: CTA CHEST FINDINGS Technically adequate study with good opacification of the central and segmental pulmonary arteries. No focal filling defects. No evidence of significant pulmonary embolus. Postoperative changes in the mediastinum with absence of the sternum and bypass grafting changes. Mild cardiac enlargement with predominant right heart enlargement. There is reflux of contrast material into the IVC and hepatic veins suggesting passive congestion due to right heart failure. Coronary artery calcifications. Calcifications in the thoracic aorta. Aorta and great vessels are patent. Prominent atherosclerotic calcification in the aorta and great vessel origins. Esophagus is decompressed. No significant lymphadenopathy in the chest. Evaluation of lungs is limited due to respiratory motion artifact. There are scattered emphysematous changes in the lungs. Dependent atelectasis in the lung bases. No definite airspace disease or consolidation. No pneumothorax. No pleural effusion. Review of the MIP images  confirms the above findings. CTA ABDOMEN AND PELVIS FINDINGS Images obtained during arterial phase of contrast bolus administration demonstrated normal caliber abdominal aorta. Prominent calcific and noncalcific atherosclerotic changes throughout the abdominal aorta. Probable high-grade stenosis of the origins of the iliac arteries bilaterally. The abdominal aorta, celiac axis, superior mesenteric artery, single bilateral renal arteries, and bilateral iliac, external iliac, and internal iliac arteries are patent. The common femoral and proximal superficial and deep femoral arteries are patent. There appears to be a wall stabbed in the origin of the superior mesenteric artery. Stent is present in  the right external iliac artery with partial thrombosis in the stent. Probable high-grade stenosis at the origin of the external iliac artery. Renal nephrograms are symmetrical. Prominent calcification at the origins of the renal arteries. Portal venous phase images demonstrate normal flow to the portal and mesenteric veins. Liver parenchymal echotexture is somewhat heterogeneous likely related to passive congestion. No focal liver lesions are identified. The gallbladder, pancreas, spleen, adrenal glands, kidneys, inferior vena cava, and retroperitoneal lymph nodes are unremarkable. Stomach, small bowel, and colon are not abnormally distended. Scattered stool in the colon. No free air or free fluid in the abdomen. Pelvis: Scattered diverticula in the sigmoid colon without evidence of diverticulitis. Appendix is not identified. Bladder wall is not thickened. Uterus is surgically absent. No free or loculated pelvic fluid collections. No pelvic mass or lymphadenopathy. Anterior compression of the L1 vertebra with sclerosis of the superior endplate and mild retropulsion of fracture fragments. There is about 40% loss of height anteriorly. There is progression since previous CT lumbar spine from 12/17/2015. Review of the MIP images confirms the above findings. IMPRESSION: No evidence of significant pulmonary embolus. No evidence of active pulmonary disease. 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 Extensive atherosclerotic changes throughout the abdominal aorta and mesenteric vessels although vessels remain patent. Multiple focal areas of stenosis are demonstrated. Previous stents in the superior mesenteric artery origin and in the right external iliac artery. Progression of the old compression fracture of the L1 vertebra. Electronically Signed   By: Lucienne Capers M.D.   On: 01/20/2016 22:19   Mr Lumbar Spine Wo Contrast  01/22/2016  CLINICAL DATA:  History of  fall 4 weeks ago. Low back and abdominal pain. Subsequent encounter. EXAM: MRI LUMBAR SPINE WITHOUT CONTRAST TECHNIQUE: Multiplanar, multisequence MR imaging of the lumbar spine was performed. No intravenous contrast was administered. COMPARISON:  CT lumbar spine 12/17/2015. CT angiogram abdomen and pelvis 01/20/2016. FINDINGS: As on the prior examinations, the patient has a subacute superior endplate compression fracture of L1. Vertebral body height loss is estimated at up to 40% anteriorly. There is some bony retropulsion off the superior endplate. Marrow edema is present within the vertebral body. Vertebral body height and signal are otherwise unremarkable. The conus medullaris is normal in signal and position. Imaged intra-abdominal contents are unremarkable. T11-12: Negative. T12-L1: Mild bony retropulsion off the superior endplate of L1 without central canal or foraminal stenosis. L1-2:  Negative. L2-3:  Negative. L3-4:  Negative. L4-5:  Mild facet degenerative change.  Otherwise negative. L5-S1:  Mild facet degenerative change.  Otherwise negative. IMPRESSION: Subacute L1 superior endplate compression fracture does not appear notably changed since the most recent exam. Mild bony retropulsion off the superior endplate of L1 does not cause central canal stenosis at T12-L1. The examination is otherwise negative. Electronically Signed   By: Inge Rise M.D.   On: 01/22/2016 13:37  Ct Cta Abd/pel W/cm &/or W/o Cm  01/20/2016  CLINICAL DATA:  Pt from home c/o 10/10 pain between shoulder blades that started today about 45 min PTA. Pt. Reports vomiting x 1week, hx of HTN, triple bypass surgery, back problems, and no sternum, former smoker, and hyster., 150 ml isovue 370 given-approx. 50 ml infiltrated into left forearm, Dr. Gerilyn Nestle consulted and ER MD, Dr. Joni Fears aware EXAM: CT ANGIOGRAPHY CHEST, ABDOMEN AND PELVIS TECHNIQUE: Multidetector CT imaging through the chest, abdomen and pelvis was performed  using the standard protocol during bolus administration of intravenous contrast. Multiplanar reconstructed images and MIPs were obtained and reviewed to evaluate the vascular anatomy. CT images of the chest were optimized for evaluation of pulmonary arteries. Additional portal venous phase images of the abdomen and pelvis were also obtained. CONTRAST:  150 mL Isovue 370 COMPARISON:  CT abdomen and pelvis 12/07/2015 FINDINGS: CTA CHEST FINDINGS Technically adequate study with good opacification of the central and segmental pulmonary arteries. No focal filling defects. No evidence of significant pulmonary embolus. Postoperative changes in the mediastinum with absence of the sternum and bypass grafting changes. Mild cardiac enlargement with predominant right heart enlargement. There is reflux of contrast material into the IVC and hepatic veins suggesting passive congestion due to right heart failure. Coronary artery calcifications. Calcifications in the thoracic aorta. Aorta and great vessels are patent. Prominent atherosclerotic calcification in the aorta and great vessel origins. Esophagus is decompressed. No significant lymphadenopathy in the chest. Evaluation of lungs is limited due to respiratory motion artifact. There are scattered emphysematous changes in the lungs. Dependent atelectasis in the lung bases. No definite airspace disease or consolidation. No pneumothorax. No pleural effusion. Review of the MIP images confirms the above findings. CTA ABDOMEN AND PELVIS FINDINGS Images obtained during arterial phase of contrast bolus administration demonstrated normal caliber abdominal aorta. Prominent calcific and noncalcific atherosclerotic changes throughout the abdominal aorta. Probable high-grade stenosis of the origins of the iliac arteries bilaterally. The abdominal aorta, celiac axis, superior mesenteric artery, single bilateral renal arteries, and bilateral iliac, external iliac, and internal iliac arteries  are patent. The common femoral and proximal superficial and deep femoral arteries are patent. There appears to be a wall stabbed in the origin of the superior mesenteric artery. Stent is present in the right external iliac artery with partial thrombosis in the stent. Probable high-grade stenosis at the origin of the external iliac artery. Renal nephrograms are symmetrical. Prominent calcification at the origins of the renal arteries. Portal venous phase images demonstrate normal flow to the portal and mesenteric veins. Liver parenchymal echotexture is somewhat heterogeneous likely related to passive congestion. No focal liver lesions are identified. The gallbladder, pancreas, spleen, adrenal glands, kidneys, inferior vena cava, and retroperitoneal lymph nodes are unremarkable. Stomach, small bowel, and colon are not abnormally distended. Scattered stool in the colon. No free air or free fluid in the abdomen. Pelvis: Scattered diverticula in the sigmoid colon without evidence of diverticulitis. Appendix is not identified. Bladder wall is not thickened. Uterus is surgically absent. No free or loculated pelvic fluid collections. No pelvic mass or lymphadenopathy. Anterior compression of the L1 vertebra with sclerosis of the superior endplate and mild retropulsion of fracture fragments. There is about 40% loss of height anteriorly. There is progression since previous CT lumbar spine from 12/17/2015. Review of the MIP images confirms the above findings. IMPRESSION: No evidence of significant pulmonary embolus. No evidence of active pulmonary disease. Changes of right heart failure with reflux of contrast material  into the IVC and hepatic veins. Extensive atherosclerotic changes throughout the abdominal aorta and mesenteric vessels although vessels remain patent. Multiple focal areas of stenosis are demonstrated. Previous stents in the superior mesenteric artery origin and in the right external iliac artery. Progression  of the old compression fracture of the L1 vertebra. Electronically Signed   By: Lucienne Capers M.D.   On: 01/20/2016 22:19   US Abdomen Limited Ruq  01/22/2016  CLINICAL DATA:  Upper abdominal pain for 2 months. EXAM: US ABDOMEN LIMITED - RIGHT UPPER QUADRANT COMPARISON:  CTA of the abdomen on 01/20/2016 FINDINGS: Gallbladder: Gallbladder has a normal appearance. Gallbladder wall is 2.4 mm, within normal limits. No stones or pericholecystic fluid. No sonographic Murphy's sign. Common bile duct: Diameter: 5.3 mm Liver: No focal lesion identified. Within normal limits in parenchymal echogenicity. IMPRESSION: Normal evaluation of the right upper quadrant. Electronically Signed   By: Nolon Nations M.D.   On: 01/22/2016 08:48    Assessment and Plan:   Daisee Foulds is a 55 y.o. y/o female who comes in today with a history of diarrhea. The patient was in the hospital for the diarrhea but now states she is constipated since she started taking Imodium 2 days ago. The patient's diarrhea had resolved before she took the Imodium but she states that they told her to take Imodium in the hospital so she started taking it. The patient will be started on some fiber supplementation and she has been told to avoid the Imodium. If she continues to have symptoms she'll call me otherwise she will notify me if her diarrhea returns.   Note: This dictation was prepared with Dragon dictation along with smaller phrase technology. Any transcriptional errors that result from this process are unintentional.

## 2016-02-08 ENCOUNTER — Ambulatory Visit: Payer: BLUE CROSS/BLUE SHIELD

## 2016-02-08 ENCOUNTER — Encounter
Admission: RE | Disposition: A | Discharge status: Home / Self Care | Payer: Self-pay | Source: Ambulatory Visit | Attending: Orthopedic Surgery

## 2016-02-08 ENCOUNTER — Ambulatory Visit
Admission: RE | Admit: 2016-02-08 | Discharge: 2016-02-08 | Disposition: A | Discharge status: Home / Self Care | Payer: BLUE CROSS/BLUE SHIELD | Source: Ambulatory Visit | Attending: Orthopedic Surgery | Admitting: Orthopedic Surgery

## 2016-02-08 ENCOUNTER — Encounter: Payer: Self-pay | Admitting: *Deleted

## 2016-02-08 ENCOUNTER — Ambulatory Visit: Payer: BLUE CROSS/BLUE SHIELD | Admitting: Certified Registered Nurse Anesthetist

## 2016-02-08 DIAGNOSIS — Z79891 Long term (current) use of opiate analgesic: Secondary | ICD-10-CM | POA: Diagnosis not present

## 2016-02-08 DIAGNOSIS — I739 Peripheral vascular disease, unspecified: Secondary | ICD-10-CM | POA: Insufficient documentation

## 2016-02-08 DIAGNOSIS — F329 Major depressive disorder, single episode, unspecified: Secondary | ICD-10-CM | POA: Diagnosis not present

## 2016-02-08 DIAGNOSIS — Z888 Allergy status to other drugs, medicaments and biological substances status: Secondary | ICD-10-CM | POA: Diagnosis not present

## 2016-02-08 DIAGNOSIS — I10 Essential (primary) hypertension: Secondary | ICD-10-CM | POA: Diagnosis not present

## 2016-02-08 DIAGNOSIS — S32010A Wedge compression fracture of first lumbar vertebra, initial encounter for closed fracture: Secondary | ICD-10-CM | POA: Insufficient documentation

## 2016-02-08 DIAGNOSIS — Z8041 Family history of malignant neoplasm of ovary: Secondary | ICD-10-CM | POA: Insufficient documentation

## 2016-02-08 DIAGNOSIS — Z79899 Other long term (current) drug therapy: Secondary | ICD-10-CM | POA: Insufficient documentation

## 2016-02-08 DIAGNOSIS — Z951 Presence of aortocoronary bypass graft: Secondary | ICD-10-CM | POA: Diagnosis not present

## 2016-02-08 DIAGNOSIS — Z955 Presence of coronary angioplasty implant and graft: Secondary | ICD-10-CM | POA: Diagnosis not present

## 2016-02-08 DIAGNOSIS — F419 Anxiety disorder, unspecified: Secondary | ICD-10-CM | POA: Insufficient documentation

## 2016-02-08 DIAGNOSIS — Z419 Encounter for procedure for purposes other than remedying health state, unspecified: Secondary | ICD-10-CM

## 2016-02-08 DIAGNOSIS — Z833 Family history of diabetes mellitus: Secondary | ICD-10-CM | POA: Insufficient documentation

## 2016-02-08 DIAGNOSIS — Z803 Family history of malignant neoplasm of breast: Secondary | ICD-10-CM | POA: Insufficient documentation

## 2016-02-08 DIAGNOSIS — I2510 Atherosclerotic heart disease of native coronary artery without angina pectoris: Secondary | ICD-10-CM | POA: Diagnosis not present

## 2016-02-08 DIAGNOSIS — X58XXXA Exposure to other specified factors, initial encounter: Secondary | ICD-10-CM | POA: Insufficient documentation

## 2016-02-08 DIAGNOSIS — Z7982 Long term (current) use of aspirin: Secondary | ICD-10-CM | POA: Diagnosis not present

## 2016-02-08 DIAGNOSIS — Z7951 Long term (current) use of inhaled steroids: Secondary | ICD-10-CM | POA: Insufficient documentation

## 2016-02-08 DIAGNOSIS — Z8249 Family history of ischemic heart disease and other diseases of the circulatory system: Secondary | ICD-10-CM | POA: Diagnosis not present

## 2016-02-08 DIAGNOSIS — Z90710 Acquired absence of both cervix and uterus: Secondary | ICD-10-CM | POA: Diagnosis not present

## 2016-02-08 DIAGNOSIS — Z8673 Personal history of transient ischemic attack (TIA), and cerebral infarction without residual deficits: Secondary | ICD-10-CM | POA: Insufficient documentation

## 2016-02-08 HISTORY — PX: KYPHOPLASTY: SHX5884

## 2016-02-08 SURGERY — KYPHOPLASTY
Anesthesia: General

## 2016-02-08 MED ORDER — LIDOCAINE HCL (PF) 1 % IJ SOLN
INTRAMUSCULAR | Status: AC
  Filled 2016-02-08: qty 30

## 2016-02-08 MED ORDER — IPRATROPIUM-ALBUTEROL 0.5-2.5 (3) MG/3ML IN SOLN
3.0000 mL | Freq: Once | RESPIRATORY_TRACT | Status: AC
  Administered 2016-02-08: 3 mL via RESPIRATORY_TRACT

## 2016-02-08 MED ORDER — SODIUM CHLORIDE 0.9 % IV SOLN
INTRAVENOUS | Status: DC
Start: 2016-02-08 — End: 2016-02-08

## 2016-02-08 MED ORDER — HYDROCODONE-ACETAMINOPHEN 5-325 MG PO TABS: 1.0000 | ORAL_TABLET | Freq: Four times a day (QID) | ORAL | Status: DC | PRN

## 2016-02-08 MED ORDER — FENTANYL CITRATE (PF) 100 MCG/2ML IJ SOLN
25.0000 ug | INTRAMUSCULAR | Status: DC | PRN
  Administered 2016-02-08: 25 ug via INTRAVENOUS

## 2016-02-08 MED ORDER — FAMOTIDINE 20 MG PO TABS
20.0000 mg | ORAL_TABLET | Freq: Once | ORAL | Status: AC
  Administered 2016-02-08: 20 mg via ORAL

## 2016-02-08 MED ORDER — METOCLOPRAMIDE HCL 5 MG/ML IJ SOLN: 5.0000 mg | Freq: Three times a day (TID) | INTRAMUSCULAR | Status: DC | PRN

## 2016-02-08 MED ORDER — BUPIVACAINE-EPINEPHRINE (PF) 0.5% -1:200000 IJ SOLN
INTRAMUSCULAR | Status: AC
  Filled 2016-02-08: qty 30

## 2016-02-08 MED ORDER — PROPOFOL 10 MG/ML IV BOLUS
INTRAVENOUS | Status: DC | PRN
  Administered 2016-02-08 (×2): 20 mg via INTRAVENOUS

## 2016-02-08 MED ORDER — IOPAMIDOL (ISOVUE-M 200) INJECTION 41%
INTRAMUSCULAR | Status: DC | PRN
  Administered 2016-02-08: 20 mL

## 2016-02-08 MED ORDER — CEFAZOLIN SODIUM-DEXTROSE 2-4 GM/100ML-% IV SOLN
INTRAVENOUS | Status: AC
  Filled 2016-02-08: qty 100

## 2016-02-08 MED ORDER — ONDANSETRON HCL 4 MG/2ML IJ SOLN: 4.0000 mg | Freq: Once | INTRAMUSCULAR | Status: DC | PRN

## 2016-02-08 MED ORDER — ACETAMINOPHEN 10 MG/ML IV SOLN
INTRAVENOUS | Status: AC
  Filled 2016-02-08: qty 100

## 2016-02-08 MED ORDER — CEFAZOLIN SODIUM-DEXTROSE 2-4 GM/100ML-% IV SOLN
2.0000 g | Freq: Once | INTRAVENOUS | Status: AC
  Administered 2016-02-08: 2 g via INTRAVENOUS

## 2016-02-08 MED ORDER — FENTANYL CITRATE (PF) 100 MCG/2ML IJ SOLN
INTRAMUSCULAR | Status: DC | PRN
  Administered 2016-02-08 (×2): 50 ug via INTRAVENOUS

## 2016-02-08 MED ORDER — ACETAMINOPHEN 10 MG/ML IV SOLN
INTRAVENOUS | Status: DC | PRN
  Administered 2016-02-08: 1000 mg via INTRAVENOUS

## 2016-02-08 MED ORDER — IPRATROPIUM-ALBUTEROL 0.5-2.5 (3) MG/3ML IN SOLN
RESPIRATORY_TRACT | Status: AC
  Administered 2016-02-08: 3 mL via RESPIRATORY_TRACT
  Filled 2016-02-08: qty 3

## 2016-02-08 MED ORDER — CARVEDILOL 12.5 MG PO TABS
ORAL_TABLET | ORAL | Status: AC
  Administered 2016-02-08: 12.5 mg via ORAL
  Filled 2016-02-08: qty 1

## 2016-02-08 MED ORDER — METOCLOPRAMIDE HCL 10 MG PO TABS: 5.0000 mg | ORAL_TABLET | Freq: Three times a day (TID) | ORAL | Status: DC | PRN

## 2016-02-08 MED ORDER — HYDROCODONE-ACETAMINOPHEN 5-325 MG PO TABS: 1.0000 | ORAL_TABLET | ORAL | Status: DC | PRN

## 2016-02-08 MED ORDER — EPHEDRINE SULFATE 50 MG/ML IJ SOLN
INTRAMUSCULAR | Status: DC | PRN
  Administered 2016-02-08: 5 mg via INTRAVENOUS
  Administered 2016-02-08: 2.5 mg via INTRAVENOUS
  Administered 2016-02-08: 5 mg via INTRAVENOUS
  Administered 2016-02-08: 2.5 mg via INTRAVENOUS
  Administered 2016-02-08: 5 mg via INTRAVENOUS

## 2016-02-08 MED ORDER — LIDOCAINE HCL 1 % IJ SOLN
INTRAMUSCULAR | Status: DC | PRN
  Administered 2016-02-08: 10 mL

## 2016-02-08 MED ORDER — PROPOFOL 500 MG/50ML IV EMUL
INTRAVENOUS | Status: DC | PRN
  Administered 2016-02-08: 100 ug/kg/min via INTRAVENOUS

## 2016-02-08 MED ORDER — HEPARIN (PORCINE) IN NACL 2-0.9 UNIT/ML-% IJ SOLN
INTRAMUSCULAR | Status: AC
  Filled 2016-02-08: qty 1000

## 2016-02-08 MED ORDER — LACTATED RINGERS IV SOLN
INTRAVENOUS | Status: DC
  Administered 2016-02-08: 100 mL/h via INTRAVENOUS

## 2016-02-08 MED ORDER — ONDANSETRON HCL 4 MG PO TABS: 4.0000 mg | ORAL_TABLET | Freq: Four times a day (QID) | ORAL | Status: DC | PRN

## 2016-02-08 MED ORDER — CARVEDILOL 12.5 MG PO TABS
12.5000 mg | ORAL_TABLET | Freq: Once | ORAL | Status: AC
  Administered 2016-02-08: 12.5 mg via ORAL

## 2016-02-08 MED ORDER — VASOPRESSIN 20 UNIT/ML IV SOLN
INTRAVENOUS | Status: DC | PRN
  Administered 2016-02-08: 4 [IU] via INTRAVENOUS

## 2016-02-08 MED ORDER — MIDAZOLAM HCL 2 MG/2ML IJ SOLN
INTRAMUSCULAR | Status: DC | PRN
  Administered 2016-02-08: 2 mg via INTRAVENOUS

## 2016-02-08 MED ORDER — PHENYLEPHRINE HCL 10 MG/ML IJ SOLN
INTRAMUSCULAR | Status: DC | PRN
  Administered 2016-02-08: 200 ug via INTRAVENOUS
  Administered 2016-02-08: 100 ug via INTRAVENOUS
  Administered 2016-02-08 (×3): 200 ug via INTRAVENOUS

## 2016-02-08 MED ORDER — FAMOTIDINE 20 MG PO TABS
ORAL_TABLET | ORAL | Status: AC
  Administered 2016-02-08: 20 mg via ORAL
  Filled 2016-02-08: qty 1

## 2016-02-08 MED ORDER — ONDANSETRON HCL 4 MG/2ML IJ SOLN
INTRAMUSCULAR | Status: AC
  Administered 2016-02-08: 4 mg via INTRAVENOUS
  Filled 2016-02-08: qty 2

## 2016-02-08 MED ORDER — LIDOCAINE HCL (PF) 1 % IJ SOLN
INTRAMUSCULAR | Status: AC
  Filled 2016-02-08: qty 60

## 2016-02-08 MED ORDER — BUPIVACAINE-EPINEPHRINE (PF) 0.5% -1:200000 IJ SOLN
INTRAMUSCULAR | Status: DC | PRN
  Administered 2016-02-08: 20 mL

## 2016-02-08 MED ORDER — IOPAMIDOL (ISOVUE-M 200) INJECTION 41%
INTRAMUSCULAR | Status: AC
  Filled 2016-02-08: qty 20

## 2016-02-08 MED ORDER — FENTANYL CITRATE (PF) 100 MCG/2ML IJ SOLN
INTRAMUSCULAR | Status: AC
  Filled 2016-02-08: qty 2

## 2016-02-08 MED ORDER — ONDANSETRON HCL 4 MG/2ML IJ SOLN: 4.0000 mg | Freq: Four times a day (QID) | INTRAMUSCULAR | Status: DC | PRN

## 2016-02-08 MED ORDER — DEXAMETHASONE SODIUM PHOSPHATE 10 MG/ML IJ SOLN
INTRAMUSCULAR | Status: DC | PRN
  Administered 2016-02-08: 10 mg via INTRAVENOUS

## 2016-02-08 MED ORDER — ONDANSETRON HCL 4 MG/2ML IJ SOLN
4.0000 mg | Freq: Once | INTRAMUSCULAR | Status: AC
  Administered 2016-02-08: 4 mg via INTRAVENOUS

## 2016-02-08 SURGICAL SUPPLY — 13 items
CEMENT KYPHON CX01A KIT/MIXER (Cement) ×3 IMPLANT
DEVICE BIOPSY BONE KYPHX (INSTRUMENTS) ×3 IMPLANT
DRAPE C-ARM XRAY 36X54 (DRAPES) ×3 IMPLANT
DURAPREP 26ML APPLICATOR (WOUND CARE) ×3 IMPLANT
GLOVE SURG ORTHO 9.0 STRL STRW (GLOVE) ×3 IMPLANT
GOWN SPECIALTY ULTRA XL (MISCELLANEOUS) ×3 IMPLANT
GOWN STRL REUS W/ TWL LRG LVL3 (GOWN DISPOSABLE) ×1 IMPLANT
GOWN STRL REUS W/TWL LRG LVL3 (GOWN DISPOSABLE) ×2
LIQUID BAND (GAUZE/BANDAGES/DRESSINGS) ×3 IMPLANT
PACK KYPHOPLASTY (MISCELLANEOUS) ×3 IMPLANT
STRAP SAFETY BODY (MISCELLANEOUS) ×3 IMPLANT
TRAY KYPHOPAK 15/3 EXPRESS 1ST (MISCELLANEOUS) IMPLANT
TRAY KYPHOPAK 20/3 EXPRESS 1ST (MISCELLANEOUS) ×3 IMPLANT

## 2016-02-08 NOTE — Transfer of Care (Signed)
Immediate Anesthesia Transfer of Care Note  Patient: Kirsten Castro  Procedure(s) Performed: Procedure(s): KYPHOPLASTY L1 (N/A)  Patient Location: PACU  Anesthesia Type:General  Level of Consciousness: sedated  Airway & Oxygen Therapy: Patient Spontanous Breathing and Patient connected to face mask oxygen  Post-op Assessment: Report given to RN and Post -op Vital signs reviewed and stable  Post vital signs: Reviewed and stable  Last Vitals:  Filed Vitals:   02/08/16 1046 02/08/16 1303  BP: 97/67   Pulse: 83   Temp: 37.1 C 36.6 C  Resp: 18     Last Pain:  Filed Vitals:   02/08/16 1303  PainSc: 10-Worst pain ever      Patients Stated Pain Goal: 1 ([id number])  Complications: No apparent anesthesia complications

## 2016-02-08 NOTE — Anesthesia Procedure Notes (Signed)
Date/Time: 02/08/2016 12:00 PM Performed by: Johnna Acosta Pre-anesthesia Checklist: Patient identified, Emergency Drugs available, Suction available, Patient being monitored and Timeout performed Patient Re-evaluated:Patient Re-evaluated prior to inductionOxygen Delivery Method: Simple face mask

## 2016-02-08 NOTE — Discharge Instructions (Signed)
AMBULATORY SURGERY  DISCHARGE INSTRUCTIONS   1) The drugs that you were given will stay in your system until tomorrow so for the next 24 hours you should not:  A) Drive an automobile B) Make any legal decisions C) Drink any alcoholic beverage   2) You may resume regular meals tomorrow.  Today it is better to start with liquids and gradually work up to solid foods.  You may eat anything you prefer, but it is better to start with liquids, then soup and crackers, and gradually work up to solid foods.   3) Please notify your doctor immediately if you have any unusual bleeding, trouble breathing, redness and pain at the surgery site, drainage, fever, or pain not relieved by medication.    Please contact your physician with any problems or Same Day Surgery at [phone], Monday through Friday 6 am to 4 pm, or [place] at [facility] number at [phone].  Percutaneous Vertebroplasty, Care After These instructions give you information on caring for yourself after your procedure. Your doctor may also give you more specific instructions. Call your doctor if you have any problems or questions after your procedure. HOME CARE  Take medicine as told by your doctor.  Keep your wound dry and covered for 24 hours or as told by your doctor.  Ask your doctor when you can bathe or shower.  Put an ice pack on your wound.  Put ice in a plastic bag.  Place a towel between your skin and the bag.  Leave the ice on for 15-20 minutes, 3-4 times a day.  Rest in your bed for 24 hours or as told by your doctor.  Return to normal activities as told by your doctor.  Ask your doctor what stretches and exercises you can do.  Do not bend or lift anything heavy as told by your doctor. GET HELP IF:  Your wound becomes red, puffy (swollen), or tender to the touch.  You are bleeding or leaking fluid from the wound.  You are sick to your stomach (nauseous) or throw up (vomit) for more than 24  hours after the procedure.  Your back pain does not get better.  You have a fever. GET HELP RIGHT AWAY IF:   You have bad back pain that comes on suddenly.  You cannot control when you pee (urinate) or poop (bowel movement).  You lose feeling (numbness) or have tingling in your legs or feet, or they become weak.  You have sudden weakness in your arms or legs.  You have shooting pain down your legs.  You have chest pain or a hard time breathing.  You feel dizzy or pass out (faint).  Your vision changes or you cannot talk as you normally do.   This information is not intended to replace advice given to you by your health care provider. Make sure you discuss any questions you have with your health care provider.   Document Released: 12/13/2009 Document Revised: 09/23/2013 Document Reviewed: 05/27/2013 Elsevier Interactive Patient Education [employer].

## 2016-02-08 NOTE — Op Note (Signed)
02/08/2016  1:04 PM  PATIENT:  Kirsten Castro  55 y.o. female  PRE-OPERATIVE DIAGNOSIS:  COMPRESSION WEDGE FRACTURE L1  POST-OPERATIVE DIAGNOSIS:  compression wedge fracture L1  PROCEDURE:  Procedure(s): KYPHOPLASTY L1 (N/A)  SURGEON: Laurene Footman, MD  ASSISTANTS: None  ANESTHESIA:   general  EBL:  Total I/O In: 600 [I.V.:600] Out: 3 [Blood:3]  BLOOD ADMINISTERED:none  DRAINS: none   LOCAL MEDICATIONS USED:  MARCAINE    and XYLOCAINE   SPECIMEN:  Source of Specimen:  L1 vertebral body  DISPOSITION OF SPECIMEN:  PATHOLOGY  COUNTS:  YES  TOURNIQUET:  * No tourniquets in log *  IMPLANTS: Bone cement  DICTATION: .Dragon Dictation patient was brought the operating room and after adequate sedation was given the patient was placed prone socially during the procedure she is essentially given a general anesthetic to maintain her laying still. C-arm was brought in and good visualization of L1 was obtained in both AP and lateral projections. Appropriate patient identification and timeout procedures were completed and 10 cc of 1% Xylocaine was a low traded subcutaneously. After prepping draping repeat timeout procedure carried out. Spinal needle was then used to get local down to the pedicle on both sides 50-50 mix of 1% Xylocaine have percent Sensorcaine with epinephrine 20 cc each side. Small incision was made on the right side and the trocar advanced into the vertebral body and a extrapedicular approach. Drilling was carried out after biopsy and balloon inserted. The identical procedures and carried on the left side approximate 2 cc of inflation on each side. Bone cement was mixed and then inserted with good fill a small amount to go into the L1-2 disc space and no more cement was placed on the right side from that there is good fill both sides and appeared to fill the fracture site. After the cement was set adequately the trochars removed and the wounds closed with Dermabond  followed by Band-Aids  PLAN OF CARE: Discharge to home after PACU  PATIENT DISPOSITION:  PACU - hemodynamically stable.

## 2016-02-08 NOTE — Anesthesia Postprocedure Evaluation (Signed)
Anesthesia Post Note  Patient: Kirsten Castro  Procedure(s) Performed: Procedure(s) (LRB): KYPHOPLASTY L1 (N/A)  Patient location during evaluation: PACU Anesthesia Type: General Level of consciousness: awake and alert Pain management: pain level controlled Vital Signs Assessment: post-procedure vital signs reviewed and stable Respiratory status: spontaneous breathing and respiratory function stable Cardiovascular status: stable Anesthetic complications: no    Last Vitals:  Filed Vitals:   02/08/16 1403 02/08/16 1425  BP: 104/72 117/53  Pulse: 72 75  Temp:    Resp: 18 18    Last Pain:  Filed Vitals:   02/08/16 1436  PainSc: 0-No pain                 Everette Mall K

## 2016-02-08 NOTE — Anesthesia Preprocedure Evaluation (Signed)
Anesthesia Evaluation  Patient identified by MRN, date of birth, ID band Patient awake    Reviewed: Allergy & Precautions, NPO status , Patient's Chart, lab work & pertinent test results, reviewed documented beta blocker date and time   History of Anesthesia Complications Negative for: history of anesthetic complications  Airway Mallampati: II       Dental  (+) Upper Dentures   Pulmonary COPD,  COPD inhaler, former smoker,           Cardiovascular hypertension, Pt. on medications and Pt. on home beta blockers + angina + CAD, + Past MI, + Cardiac Stents, + CABG and + Peripheral Vascular Disease       Neuro/Psych Anxiety Depression TIA (unsure of symptoms)   GI/Hepatic Neg liver ROS, GERD  Medicated and Poorly Controlled,  Endo/Other  negative endocrine ROS  Renal/GU negative Renal ROS     Musculoskeletal   Abdominal   Peds  Hematology  (+) anemia ,   Anesthesia Other Findings   Reproductive/Obstetrics                             Anesthesia Physical Anesthesia Plan  ASA: III  Anesthesia Plan: General   Post-op Pain Management:    Induction: Intravenous  Airway Management Planned: Nasal Cannula  Additional Equipment:   Intra-op Plan:   Post-operative Plan:   Informed Consent: I have reviewed the patients History and Physical, chart, labs and discussed the procedure including the risks, benefits and alternatives for the proposed anesthesia with the patient or authorized representative who has indicated his/her understanding and acceptance.     Plan Discussed with:   Anesthesia Plan Comments:         Anesthesia Quick Evaluation

## 2016-02-08 NOTE — H&P (Signed)
Reviewed paper H+P, will be scanned into chart. No changes noted.  

## 2016-02-09 LAB — SURGICAL PATHOLOGY

## 2016-02-24 ENCOUNTER — Other Ambulatory Visit: Payer: Self-pay | Admitting: Orthopedic Surgery

## 2016-02-24 DIAGNOSIS — S32040A Wedge compression fracture of fourth lumbar vertebra, initial encounter for closed fracture: Secondary | ICD-10-CM

## 2016-02-25 ENCOUNTER — Other Ambulatory Visit: Payer: Self-pay

## 2016-02-29 ENCOUNTER — Ambulatory Visit (INDEPENDENT_AMBULATORY_CARE_PROVIDER_SITE_OTHER): Payer: BLUE CROSS/BLUE SHIELD | Admitting: Surgery

## 2016-02-29 ENCOUNTER — Encounter: Payer: Self-pay | Admitting: Surgery

## 2016-02-29 VITALS — BP 138/82 | HR 79 | Temp 98.3°F | Ht 61.0 in | Wt 150.0 lb

## 2016-02-29 DIAGNOSIS — N644 Mastodynia: Secondary | ICD-10-CM

## 2016-02-29 NOTE — Patient Instructions (Addendum)
Your dermatologist appointment will be on 03/01/2016 on the [facility] [phone] at 1:40 PM, please arrive 15 minutes prior to your appointment.  Your appointment to have your mammogram will be on 03/08/2016 at 9:00 AM [address]. Please arrive 15 minutes earlier to your appointment.   Once we get results, we will see you back.

## 2016-02-29 NOTE — Progress Notes (Signed)
Patient ID: Kirsten Castro, female   DOB: 24-Jul-1961, 55 y.o.   MRN: [id number]  History of Present Illness Kirsten Castro is a 55 y.o. female seen in consultation for bilateral chest pain and breast pain and some skin changes. She reports having daily sharp pain in both her chest wall and bilateral breast. Pain is constant and severe in nature and worsening when she applies pressure to her progress. Pain is also radiated to the sternum and also to the bilateral shoulders. Of note she had a history of coronary artery disease and a history of a CABG at the hospital a few years ago complicated by sternal dehiscence and ostial of the sternum requiring pectoralis flap muscle repair [facility]. None of those records are available at this time. From her breast perspective she had a history of a breast cyst that was aspirated 3 years ago and no mammograms since then. Of note follow-up of the mammogram revealed resolution of the breast cyst. She does have a sister with breast and ovarian cancer. She has placed some and cream on the skin of the breast with no major results. She has stopped smoking She has a history of chronic back pain  Past Medical History Past Medical History  Diagnosis Date  . Stroke (Kirsten Castro)   . Coronary artery disease   . COPD (chronic obstructive pulmonary disease) (Kirsten Castro)   . Myocardial infarction (Kirsten Castro)   . Anginal pain (Kirsten Castro)   . Hypertension   . Cancer [facility])     melanoma skin cancer     Past Surgical History  Procedure Laterality Date  . Coronary artery bypass graft    . Peripheral vascular catheterization N/A 02/10/2015    Procedure: Carotid PTA/Stent Intervention;  Surgeon: Kirsten Cabal, MD;  Location: [place] [facility];  Service: Cardiovascular;  Laterality: N/A;  . Stent placement vascular ([facility] hx)    . Peripheral vascular catheterization N/A 12/17/2015    Procedure: Visceral Angiography;  Surgeon: Kirsten Cabal, MD;  Location: [place] [facility];   Service: Cardiovascular;  Laterality: N/A;  . Peripheral vascular catheterization N/A 12/17/2015    Procedure: Visceral Artery Intervention;  Surgeon: Kirsten Cabal, MD;  Location: [place] [facility];  Service: Cardiovascular;  Laterality: N/A;  . Abdominal hysterectomy    . Other surgical history  Jul 11 2014    sternum removal  . Nose surgery      cancer removal  . Esophagogastroduodenoscopy (egd) with propofol N/A 01/24/2016    Procedure: ESOPHAGOGASTRODUODENOSCOPY (EGD) WITH PROPOFOL;  Surgeon: Kirsten Lame, MD;  Location: [facility] ENDOSCOPY;  Service: Endoscopy;  Laterality: N/A;  . Colonoscopy with propofol N/A 01/24/2016    Procedure: COLONOSCOPY WITH PROPOFOL;  Surgeon: Kirsten Lame, MD;  Location: [facility] ENDOSCOPY;  Service: Endoscopy;  Laterality: N/A;  . Kyphoplasty N/A 02/08/2016    Procedure: KYPHOPLASTY L1;  Surgeon: Kirsten Knows, MD;  Location: [facility] ORS;  Service: Orthopedics;  Laterality: N/A;    Allergies  Allergen Reactions  . Ferrous Sulfate Hives    Current Outpatient Prescriptions  Medication Sig Dispense Refill  . acetaminophen (TYLENOL) 500 MG tablet Take 1,000 mg by mouth every 6 (six) hours as needed for mild pain or headache.    . ALPRAZolam (XANAX) 1 MG tablet Take 1 mg by mouth 3 (three) times daily.    Marland Kitchen aspirin 81 MG EC tablet Take 81 mg by mouth daily.     Marland Kitchen atorvastatin (LIPITOR) 80 MG tablet Take 80 mg by mouth daily.    Marland Kitchen  carvedilol (COREG) 12.5 MG tablet Take 12.5 mg by mouth 2 (two) times daily with a meal.    . cetirizine (ZYRTEC) 10 MG tablet Take 10 mg by mouth daily. Reported on 02/08/2016    . clopidogrel (PLAVIX) 75 MG tablet     . clotrimazole (LOTRIMIN) 1 % cream APP EXT AA BID  0  . fenofibrate (TRICOR) 145 MG tablet Take by mouth.    . Fluticasone-Salmeterol (ADVAIR) 250-50 MCG/DOSE AEPB Inhale 1 puff into the lungs 2 (two) times daily.    . furosemide (LASIX) 40 MG tablet Take by mouth.    Marland Kitchen lisinopril (ZESTRIL) 10 MG tablet Take 1 tablet (10  mg total) by mouth daily. 60 tablet 1  . Magnesium 250 MG TABS Take 250 mg by mouth 2 (two) times daily.    Marland Kitchen nystatin (MYCOSTATIN) 100000 UNIT/ML suspension Take 5 mLs by mouth daily.  0  . oxyCODONE (OXY IR/ROXICODONE) 5 MG immediate release tablet Take 1 tablet by mouth every 12 (twelve) hours.    . Oxycodone HCl 20 MG TABS Take 1 tablet by mouth every 8 (eight) hours.    Marland Kitchen oxyCODONE-acetaminophen (PERCOCET/ROXICET) 5-325 MG tablet Take 1 tablet by mouth 1 day or 1 dose.    . pantoprazole (PROTONIX) 40 MG tablet Take 1 tablet (40 mg total) by mouth 2 (two) times daily. 60 tablet 1  . potassium chloride (K-DUR,KLOR-CON) 10 MEQ tablet Take 10 mEq by mouth 2 (two) times daily.    . traZODone (DESYREL) 50 MG tablet Take 50 mg by mouth at bedtime as needed for sleep.      No current facility-administered medications for this visit.    Family History Family History  Problem Relation Age of Onset  . Breast cancer Sister   . CAD Father   . CAD Brother      Social History Social History  Substance Use Topics  . Smoking status: Former Smoker    Quit date: 08/01/2012  . Smokeless tobacco: Never Used  . Alcohol Use: No     ROS 10 point ROS was performed and is otherwise negative  Physical Exam Blood pressure 138/82, pulse 79, temperature 98.3 F (36.8 C), temperature source Oral, height 5\' 1"  (1.549 m), weight 68.04 kg (150 lb).  CONSTITUTIONAL: No acute distress, awake alert EYES: Pupils equal, round, and reactive to light, Sclera non-icteric. EARS, NOSE, MOUTH AND THROAT: The oropharynx is clear. Oral mucosa is pink and moist. Hearing is intact to voice.  NECK: Trachea is midline, and there is no jugular venous distension. Thyroid is without palpable abnormalities. LYMPH NODES:  Lymph nodes in the neck are not enlarged. BREAST:  There is a skin rash with some papular rash on both nipples and skin around areola,  There is diffuse tenderness to palpation bilaterally. There is no  palpable mass. No axillary adenopathy CHEST: sternum is stable w sternotomy scar well healed RESPIRATORY:  Lungs are clear, and breath sounds are equal bilaterally. Normal respiratory effort without pathologic use of accessory muscles. CARDIOVASCULAR: Heart is regular without murmurs, gallops, or rubs. GI: The abdomen is  soft, nontender, and nondistended. There were no palpable masses. There was no hepatosplenomegaly. There were normal bowel sounds. MUSCULOSKELETAL:  Normal muscle strength and tone in all four extremities.    SKIN: Skin turgor is normal. There are no pathologic skin lesions.  NEUROLOGIC:  Motor and sensation is grossly normal.  Cranial nerves are grossly intact. PSYCH:  Alert and oriented to person, place and time. Affect  is normal.  Data Reviewed Previous mammograms reviewed  I have personally reviewed the patient's imaging and medical records.    Assessment/Plan Bilateral breast pain as associated with some skin changes. First order of business is to obtain bilateral mammograms. After obtaining bilateral mammogram was nondiagnostic we will proceed with biopsy of the skin changes. I doubt that this is inflammatory breast cancer  but nonetheless we will have to rule this out. Likely an pain is multifactorial with the patient already chronic pain issues. No need for immediate surgical intervention. F/u after mammo is done. We will also refer her for dermatology to determine the cause of the skin changes. Extensive counseling provided.  Kirsten Budzik, MD [place] 02/29/2016, 12:06 PM

## 2016-03-02 HISTORY — PX: BREAST BIOPSY: SHX20

## 2016-03-06 ENCOUNTER — Emergency Department
Admission: EM | Admit: 2016-03-06 | Discharge: 2016-03-07 | Disposition: A | Discharge status: Home / Self Care | Payer: BLUE CROSS/BLUE SHIELD | Attending: Emergency Medicine | Admitting: Emergency Medicine

## 2016-03-06 DIAGNOSIS — Z7982 Long term (current) use of aspirin: Secondary | ICD-10-CM | POA: Insufficient documentation

## 2016-03-06 DIAGNOSIS — Z79899 Other long term (current) drug therapy: Secondary | ICD-10-CM | POA: Insufficient documentation

## 2016-03-06 DIAGNOSIS — R0789 Other chest pain: Secondary | ICD-10-CM | POA: Diagnosis not present

## 2016-03-06 DIAGNOSIS — Z8679 Personal history of other diseases of the circulatory system: Secondary | ICD-10-CM | POA: Insufficient documentation

## 2016-03-06 DIAGNOSIS — I10 Essential (primary) hypertension: Secondary | ICD-10-CM | POA: Insufficient documentation

## 2016-03-06 DIAGNOSIS — E785 Hyperlipidemia, unspecified: Secondary | ICD-10-CM | POA: Diagnosis not present

## 2016-03-06 DIAGNOSIS — J449 Chronic obstructive pulmonary disease, unspecified: Secondary | ICD-10-CM | POA: Insufficient documentation

## 2016-03-06 DIAGNOSIS — Z85828 Personal history of other malignant neoplasm of skin: Secondary | ICD-10-CM | POA: Diagnosis not present

## 2016-03-06 DIAGNOSIS — R531 Weakness: Secondary | ICD-10-CM | POA: Insufficient documentation

## 2016-03-06 DIAGNOSIS — R519 Headache, unspecified: Secondary | ICD-10-CM

## 2016-03-06 DIAGNOSIS — Z87891 Personal history of nicotine dependence: Secondary | ICD-10-CM | POA: Insufficient documentation

## 2016-03-06 DIAGNOSIS — R55 Syncope and collapse: Secondary | ICD-10-CM | POA: Diagnosis not present

## 2016-03-06 DIAGNOSIS — R079 Chest pain, unspecified: Secondary | ICD-10-CM

## 2016-03-06 DIAGNOSIS — I252 Old myocardial infarction: Secondary | ICD-10-CM | POA: Diagnosis not present

## 2016-03-06 DIAGNOSIS — R51 Headache: Secondary | ICD-10-CM | POA: Diagnosis not present

## 2016-03-06 DIAGNOSIS — Z951 Presence of aortocoronary bypass graft: Secondary | ICD-10-CM | POA: Insufficient documentation

## 2016-03-06 DIAGNOSIS — R202 Paresthesia of skin: Secondary | ICD-10-CM | POA: Diagnosis not present

## 2016-03-06 NOTE — ED Notes (Signed)
Patient to ED via POV for a fall in the shower. Triage, first nurse and charge nurse had to extricate patient from front seat of car as she stated she hurt to bad to move and that her legs were too weak to stand. Patient was in fact able to stand with two person assist. Patient now states she did not fall in the shower but rather she lowered herself down as she felt weak. Patient denies striking her head or injurying herself. Patient without facial droop or slurred speech. Patient states she has pain from her head to her toes. No obvious injury or ecchymosis noted. History of CABG at [facility] in 2014.

## 2016-03-07 ENCOUNTER — Emergency Department: Payer: BLUE CROSS/BLUE SHIELD

## 2016-03-07 LAB — CBC
HEMATOCRIT: 45.1 % (ref 35.0–47.0)
HEMOGLOBIN: 15.5 g/dL (ref 12.0–16.0)
MCH: 32.6 pg (ref 26.0–34.0)
MCHC: 34.4 g/dL (ref 32.0–36.0)
MCV: 94.8 fL (ref 80.0–100.0)
PLATELETS: 314 10*3/uL (ref 150–440)
RBC: 4.76 MIL/uL (ref 3.80–5.20)
RDW: 14.2 % (ref 11.5–14.5)
WBC: 7.2 10*3/uL (ref 3.6–11.0)

## 2016-03-07 LAB — BLOOD GAS, VENOUS
Acid-base deficit: 0.2 mmol/L (ref 0.0–2.0)
Bicarbonate: 25.4 mEq/L (ref 21.0–28.0)
O2 Saturation: 78.1 %
PATIENT TEMPERATURE: 37
PO2 VEN: 44 mmHg (ref 31.0–45.0)
pCO2, Ven: 44 mmHg (ref 44.0–60.0)
pH, Ven: 7.37 (ref 7.320–7.430)

## 2016-03-07 LAB — COMPREHENSIVE METABOLIC PANEL
ALK PHOS: 112 U/L (ref 38–126)
ALT: 19 U/L (ref 14–54)
AST: 22 U/L (ref 15–41)
Albumin: 4.2 g/dL (ref 3.5–5.0)
Anion gap: 11 (ref 5–15)
BUN: 15 mg/dL (ref 6–20)
CALCIUM: 9.3 mg/dL (ref 8.9–10.3)
CO2: 24 mmol/L (ref 22–32)
CREATININE: 0.97 mg/dL (ref 0.44–1.00)
Chloride: 93 mmol/L — ABNORMAL LOW (ref 101–111)
Glucose, Bld: 105 mg/dL — ABNORMAL HIGH (ref 65–99)
Potassium: 4.1 mmol/L (ref 3.5–5.1)
Sodium: 128 mmol/L — ABNORMAL LOW (ref 135–145)
Total Bilirubin: 0.8 mg/dL (ref 0.3–1.2)
Total Protein: 6.9 g/dL (ref 6.5–8.1)

## 2016-03-07 LAB — URINALYSIS COMPLETE WITH MICROSCOPIC (ARMC ONLY)
BILIRUBIN URINE: NEGATIVE
Bacteria, UA: NONE SEEN
Glucose, UA: NEGATIVE mg/dL
HGB URINE DIPSTICK: NEGATIVE
KETONES UR: NEGATIVE mg/dL
LEUKOCYTES UA: NEGATIVE
Nitrite: NEGATIVE
PH: 7 (ref 5.0–8.0)
Protein, ur: NEGATIVE mg/dL
RBC / HPF: NONE SEEN RBC/hpf (ref 0–5)
Specific Gravity, Urine: 1.008 (ref 1.005–1.030)
Squamous Epithelial / LPF: NONE SEEN

## 2016-03-07 LAB — MAGNESIUM: Magnesium: 1.7 mg/dL (ref 1.7–2.4)

## 2016-03-07 LAB — TROPONIN I: Troponin I: <0.03 ng/mL (ref <0.031)

## 2016-03-07 MED ORDER — DIPHENHYDRAMINE HCL 50 MG/ML IJ SOLN
25.0000 mg | Freq: Once | INTRAMUSCULAR | Status: AC
  Administered 2016-03-07: 25 mg via INTRAVENOUS
  Filled 2016-03-07: qty 1

## 2016-03-07 MED ORDER — METOCLOPRAMIDE HCL 5 MG/ML IJ SOLN
10.0000 mg | Freq: Once | INTRAMUSCULAR | Status: AC
Start: 2016-03-07 — End: 2016-03-07
  Administered 2016-03-07: 10 mg via INTRAVENOUS
  Filled 2016-03-07: qty 2

## 2016-03-07 MED ORDER — KETOROLAC TROMETHAMINE 30 MG/ML IJ SOLN
30.0000 mg | Freq: Once | INTRAMUSCULAR | Status: AC
Start: 2016-03-07 — End: 2016-03-07
  Administered 2016-03-07: 30 mg via INTRAVENOUS
  Filled 2016-03-07: qty 1

## 2016-03-07 MED ORDER — SODIUM CHLORIDE 0.9 % IV BOLUS (SEPSIS)
1000.0000 mL | Freq: Once | INTRAVENOUS | Status: AC
  Administered 2016-03-07: 1000 mL via INTRAVENOUS

## 2016-03-07 MED ORDER — DIAZEPAM 2 MG PO TABS
2.0000 mg | ORAL_TABLET | Freq: Once | ORAL | Status: AC
  Administered 2016-03-07: 2 mg via ORAL
  Filled 2016-03-07: qty 1

## 2016-03-07 NOTE — ED Provider Notes (Signed)
[facility] Emergency Department Provider Note   ____________________________________________  Time seen: Approximately 0004 AM  I have reviewed the triage vital signs and the nursing notes.   HISTORY  Chief Complaint Fall    HPI Kirsten Castro is a 55 y.o. female who comes into the hospital today with a fall versus passing out at home. The patient's significant other reports that she's had some medical problems going on for the last few months. He reports that her blood pressures went up and down, she's had diarrhea and constipation. She is also had tightness in her chest. She did have back surgery 1 month ago. The patient reports that today she had some tightness in her chest where she was gasping for air some numbness in her feet and her hands that she was in the shower when she felt that she passed out. The patient reports that she did hit her head. She initially told EMS that she passed out but then told staff here that she lowered herself down to the floor because she felt weak. The patient reports that she took all of her medications today except her Xanax. The patient has had some difficulty with medication adjustment since her surgery and has been seeing her doctor is Dr. Ubaldo Glassing and Dr. Ola Spurr often recently. The patient reports that she has a rash to her breast. The patient's chest tightness she reports started today in the gasping for air started tonight. She reports that the arm and leg tingling and numbness has been going on since yesterday. She reports that she hurts everywhere and she is unable to move. She rates her pain a 10 out of 10 in intensity. She also reports she has a headache.   Past Medical History  Diagnosis Date  . Stroke ([place])   . Coronary artery disease   . COPD (chronic obstructive pulmonary disease) ([place])   . Myocardial infarction ([place])   . Anginal pain ([place])   . Hypertension   . Cancer [facility])     melanoma skin cancer     Patient Active Problem List   Diagnosis Date Noted  . Temporary cerebral vascular dysfunction 02/03/2016  . Peripheral vascular disease ([place]) 02/03/2016  . BP (high blood pressure) 02/03/2016  . HLD (hyperlipidemia) 02/03/2016  . Clinical depression 02/03/2016  . Carotid artery narrowing 02/03/2016  . 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 Malignant neoplastic disease ([place]) 02/03/2016  . Anxiety 02/03/2016  . Abdominal pain, generalized   . Nausea with vomiting   . Gastritis   . Esophageal candidiasis ([place])   . Noninfectious diarrhea   . Intractable pain 01/21/2016  . Upper abdominal pain   . Nausea and vomiting in adult   . Ischemic colitis ([place]) 01/01/2016  . Colitis 12/15/2015  . GI bleed 12/15/2015  . Acute inflammation of the pancreas 12/06/2015  . Vitamin B12 deficiency anemia 02/25/2015  . Absolute anemia 02/25/2015  . Carotid stenosis 02/10/2015  . Personal history of other specified conditions 11/27/2014  . Osteomyelitis ([place]) 11/27/2014  . H/O coronary artery bypass surgery 05/18/2014    Past Surgical History  Procedure Laterality Date  . Coronary artery bypass graft    . Peripheral vascular catheterization N/A 02/10/2015    Procedure: Carotid PTA/Stent Intervention;  Surgeon: Katha Cabal, MD;  Location: [place] [facility];  Service: Cardiovascular;  Laterality: N/A;  . Stent placement vascular ([facility] hx)    . Peripheral vascular catheterization N/A 12/17/2015  Procedure: Visceral Angiography;  Surgeon: Katha Cabal, MD;  Location: [place] [facility];  Service: Cardiovascular;  Laterality: N/A;  . Peripheral vascular catheterization N/A 12/17/2015    Procedure: Visceral Artery Intervention;  Surgeon: Katha Cabal, MD;  Location: [place] [facility];  Service: Cardiovascular;  Laterality: N/A;  . Abdominal hysterectomy    . Other surgical history  Jul 11 2014    sternum removal  . Nose surgery      cancer  removal  . Esophagogastroduodenoscopy (egd) with propofol N/A 01/24/2016    Procedure: ESOPHAGOGASTRODUODENOSCOPY (EGD) WITH PROPOFOL;  Surgeon: Lucilla Lame, MD;  Location: [facility] ENDOSCOPY;  Service: Endoscopy;  Laterality: N/A;  . Colonoscopy with propofol N/A 01/24/2016    Procedure: COLONOSCOPY WITH PROPOFOL;  Surgeon: Lucilla Lame, MD;  Location: [facility] ENDOSCOPY;  Service: Endoscopy;  Laterality: N/A;  . Kyphoplasty N/A 02/08/2016    Procedure: KYPHOPLASTY L1;  Surgeon: Hessie Knows, MD;  Location: [facility] ORS;  Service: Orthopedics;  Laterality: N/A;    Current Outpatient Rx  Name  Route  Sig  Dispense  Refill  . acetaminophen (TYLENOL) 500 MG tablet   Oral   Take 1,000 mg by mouth every 6 (six) hours as needed for mild pain or headache.         . ALPRAZolam (XANAX) 1 MG tablet   Oral   Take 1 mg by mouth 3 (three) times daily.         Marland Kitchen aspirin 81 MG EC tablet   Oral   Take 81 mg by mouth daily.          Marland Kitchen atorvastatin (LIPITOR) 80 MG tablet   Oral   Take 80 mg by mouth daily.         . carvedilol (COREG) 12.5 MG tablet   Oral   Take 12.5 mg by mouth 2 (two) times daily with a meal.         . cetirizine (ZYRTEC) 10 MG tablet   Oral   Take 10 mg by mouth daily. Reported on 02/08/2016         . clopidogrel (PLAVIX) 75 MG tablet               . clotrimazole (LOTRIMIN) 1 % cream      APP EXT AA BID      0   . fenofibrate (TRICOR) 145 MG tablet   Oral   Take by mouth.         . Fluticasone-Salmeterol (ADVAIR) 250-50 MCG/DOSE AEPB   Inhalation   Inhale 1 puff into the lungs 2 (two) times daily.         . furosemide (LASIX) 40 MG tablet   Oral   Take by mouth.         Marland Kitchen lisinopril (ZESTRIL) 10 MG tablet   Oral   Take 1 tablet (10 mg total) by mouth daily.   60 tablet   1   . Magnesium 250 MG TABS   Oral   Take 250 mg by mouth 2 (two) times daily.         Marland Kitchen nystatin (MYCOSTATIN) 100000 UNIT/ML suspension   Oral   Take 5 mLs by mouth  daily.      0   . oxyCODONE (OXY IR/ROXICODONE) 5 MG immediate release tablet   Oral   Take 1 tablet by mouth every 12 (twelve) hours.         . Oxycodone HCl 20 MG TABS   Oral  Take 1 tablet by mouth every 8 (eight) hours.         Marland Kitchen oxyCODONE-acetaminophen (PERCOCET/ROXICET) 5-325 MG tablet   Oral   Take 1 tablet by mouth 1 day or 1 dose.         . pantoprazole (PROTONIX) 40 MG tablet   Oral   Take 1 tablet (40 mg total) by mouth 2 (two) times daily.   60 tablet   1   . potassium chloride (K-DUR,KLOR-CON) 10 MEQ tablet   Oral   Take 10 mEq by mouth 2 (two) times daily.         . traZODone (DESYREL) 50 MG tablet   Oral   Take 50 mg by mouth at bedtime as needed for sleep.            Allergies Ferrous sulfate  Family History  Problem Relation Age of Onset  . Breast cancer Sister   . CAD Father   . CAD Brother     Social History Social History  Substance Use Topics  . Smoking status: Former Smoker    Quit date: 08/01/2012  . Smokeless tobacco: Never Used  . Alcohol Use: No    Review of Systems Constitutional: No fever/chills Eyes: No visual changes. ENT: No sore throat. Cardiovascular:  chest pain. Respiratory:  shortness of breath. Gastrointestinal: No abdominal pain.  No nausea, no vomiting.  No diarrhea.  No constipation. Genitourinary: Negative for dysuria. Musculoskeletal:  back pain. Skin: rash. Neurological: Headache with numbness and tingling in legs and arms  10-point ROS otherwise negative.  ____________________________________________   PHYSICAL EXAM:  VITAL SIGNS: ED Triage Vitals  Enc Vitals Group     BP 03/06/16 2351 119/68 mmHg     Pulse Rate 03/06/16 2351 83     Resp 03/06/16 2351 22     Temp 03/06/16 2351 97.7 F (36.5 C)     Temp Source 03/06/16 2351 Oral     SpO2 03/06/16 2351 99 %     Weight 03/06/16 2351 150 lb (68.04 kg)     Height 03/06/16 2351 5\' 2"  (1.575 m)     Head Cir --      Peak Flow --       Pain Score 03/06/16 2353 10     Pain Loc --      Pain Edu? --      Excl. in [place]? --     Constitutional: Alert and oriented. Disheveled appearing and in moderate distress. Eyes: Conjunctivae are normal. PERRL. EOMI. Head: Atraumatic. Nose: No congestion/rhinnorhea. Neck: Patient has some positive cervical motion tenderness to palpation Mouth/Throat: Mucous membranes are moist.  Oropharynx non-erythematous. Cardiovascular: Normal rate, regular rhythm. Grossly normal heart sounds.  Good peripheral circulation. Respiratory: Increased respiratory effort.  No retractions. Lungs CTAB. Gastrointestinal: Soft and nontender. No distention. Positive bowel sounds Musculoskeletal: No lower extremity tenderness nor edema.   Neurologic:  Normal speech and language. Cranial nerves II through XII are grossly intact, patient has some generalized weakness with no focal weakness.  Skin:  Skin is warm, dry and intact.  Psychiatric: Patient moaning and reports in pain.  ____________________________________________   LABS (all labs ordered are listed, but only abnormal results are displayed)  Labs Reviewed  COMPREHENSIVE METABOLIC PANEL - Abnormal; Notable for the following:    Sodium 128 (*)    Chloride 93 (*)    Glucose, Bld 105 (*)    All other components within normal limits  URINALYSIS COMPLETEWITH MICROSCOPIC ([facility] ONLY) - Abnormal; Notable  for the following:    Color, Urine YELLOW (*)    APPearance CLEAR (*)    All other components within normal limits  CBC  TROPONIN I  MAGNESIUM  BLOOD GAS, VENOUS  TROPONIN I   ____________________________________________  EKG  ED ECG REPORT I, Loney Hering, the attending physician, personally viewed and interpreted this ECG.   Date: 03/06/2016  EKG Time: 2352  Rate: 83  Rhythm: normal sinus rhythm  Axis: normal  Intervals:none  ST&T Change: none  ____________________________________________  RADIOLOGY  CT head and cervical spine:  No acute intracranial process, generalized cerebral atrophy with mild chronic small vessel ischemic disease, intracranial atherosclerosis. No acute traumatic injury within the cervical spine,   Chest x-ray: Mild right basilar atelectasis, no other active cardiopulmonary disease, sequela of prior CABG ____________________________________________   PROCEDURES  Procedure(s) performed: None  Critical Care performed: No  ____________________________________________   INITIAL IMPRESSION / ASSESSMENT AND PLAN / ED COURSE  Pertinent labs & imaging results that were available during my care of the patient were reviewed by me and considered in my medical decision making (see chart for details).  This is a 55 year old female who comes into the hospital today with a syncopal episode versus weakness. The patient had some change in her story from EMS to the staff here to my evaluation. I did check some blood work here and she does have some mild hyponatremia. She was complaining of a headache so I did give her some normal saline with Reglan, Benadryl and Toradol. The patient fell asleep and although she complained of pain was sleeping without any difficulty. I looked back in the patient's notes and it appears as though she's been having some chest/breast pain for some time and is having a mammogram done. She has been following with the surgeon Dr. Dahlia Byes for this pain. When asked if her pain from today was similar to her previous pain she could not answer. When I did return to the room the patient was sitting on the stretcher in no acute distress. She was moving both of her legs without any difficulties. Her pain appeared improved. She was moving with more ease that she had previously. I informed the patient of the results and informed her that she needed to follow up her doctors that she has scheduled. The patient will be discharged home to follow-up with her primary care  physicians. ____________________________________________   FINAL CLINICAL IMPRESSION(S) / ED DIAGNOSES  Final diagnoses:  Pre-syncope  Weakness  Acute nonintractable headache, unspecified headache type  Chest pain, unspecified chest pain type  Paresthesia      NEW MEDICATIONS STARTED DURING THIS VISIT:  Discharge Medication List as of 03/07/2016  5:10 AM       Note:  This document was prepared using Dragon voice recognition software and may include unintentional dictation errors.    Loney Hering, MD 03/07/16 [phone]

## 2016-03-07 NOTE — Discharge Instructions (Signed)
General Headache Without Cause A headache is pain or discomfort felt around the head or neck area. The specific cause of a headache may not be found. There are many causes and types of headaches. A few common ones are:  Tension headaches.  Migraine headaches.  Cluster headaches.  Chronic daily headaches. HOME CARE INSTRUCTIONS  Watch your condition for any changes. Take these steps to help with your condition: Managing Pain  Take over-the-counter and prescription medicines only as told by your health care provider.  Lie down in a dark, quiet room when you have a headache.  If directed, apply ice to the head and neck area:  Put ice in a plastic bag.  Place a towel between your skin and the bag.  Leave the ice on for 20 minutes, 2-3 times per day.  Use a heating pad or hot shower to apply heat to the head and neck area as told by your health care provider.  Keep lights dim if bright lights bother you or make your headaches worse. Eating and Drinking  Eat meals on a regular schedule.  Limit alcohol use.  Decrease the amount of caffeine you drink, or stop drinking caffeine. General Instructions  Keep all follow-up visits as told by your health care provider. This is important.  Keep a headache journal to help find out what may trigger your headaches. For example, write down:  What you eat and drink.  How much sleep you get.  Any change to your diet or medicines.  Try massage or other relaxation techniques.  Limit stress.  Sit up straight, and do not tense your muscles.  Do not use tobacco products, including cigarettes, chewing tobacco, or e-cigarettes. If you need help quitting, ask your health care provider.  Exercise regularly as told by your health care provider.  Sleep on a regular schedule. Get 7-9 hours of sleep, or the amount recommended by your health care provider. SEEK MEDICAL CARE IF:   Your symptoms are not helped by medicine.  You have a  headache that is different from the usual headache.  You have nausea or you vomit.  You have a fever. SEEK IMMEDIATE MEDICAL CARE IF:   Your headache becomes severe.  You have repeated vomiting.  You have a stiff neck.  You have a loss of vision.  You have problems with speech.  You have pain in the eye or ear.  You have muscular weakness or loss of muscle control.  You lose your balance or have trouble walking.  You feel faint or pass out.  You have confusion.   This information is not intended to replace advice given to you by your health care provider. Make sure you discuss any questions you have with your health care provider.   Document Released: 09/18/2005 Document Revised: 06/09/2015 Document Reviewed: 01/11/2015 Elsevier Interactive Patient Education 2016 [employer].  Near-Syncope Near-syncope (commonly known as near fainting) is sudden weakness, dizziness, or feeling like you might pass out. During an episode of near-syncope, you may also develop pale skin, have tunnel vision, or feel sick to your stomach (nauseous). Near-syncope may occur when getting up after sitting or while standing for a long time. It is caused by a sudden decrease in blood flow to the brain. This decrease can result from various causes or triggers, most of which are not serious. However, because near-syncope can sometimes be a sign of something serious, a medical evaluation is required. The specific cause is often not determined. HOME CARE  INSTRUCTIONS  Monitor your condition for any changes. The following actions may help to alleviate any discomfort you are experiencing:  Have someone stay with you until you feel stable.  Lie down right away and prop your feet up if you start feeling like you might faint. Breathe deeply and steadily. Wait until all the symptoms have passed. Most of these episodes last only a few minutes. You may feel tired for several hours.   Drink enough fluids to keep  your urine clear or pale yellow.   If you are taking blood pressure or heart medicine, get up slowly when seated or lying down. Take several minutes to sit and then stand. This can reduce dizziness.  Follow up with your health care provider as directed. SEEK IMMEDIATE MEDICAL CARE IF:   You have a severe headache.   You have unusual pain in the chest, abdomen, or back.   You are bleeding from the mouth or rectum, or you have black or tarry stool.   You have an irregular or very fast heartbeat.   You have repeated fainting or have seizure-like jerking during an episode.   You faint when sitting or lying down.   You have confusion.   You have difficulty walking.   You have severe weakness.   You have vision problems.  MAKE SURE YOU:   Understand these instructions.  Will watch your condition.  Will get help right away if you are not doing well or get worse.   This information is not intended to replace advice given to you by your health care provider. Make sure you discuss any questions you have with your health care provider.   Document Released: 09/18/2005 Document Revised: 09/23/2013 Document Reviewed: 02/21/2013 Elsevier Interactive Patient Education 2016 [employer]  Nonspecific Chest Pain  Chest pain can be caused by many different conditions. There is always a chance that your pain could be related to something serious, such as a heart attack or a blood clot in your lungs. Chest pain can also be caused by conditions that are not life-threatening. If you have chest pain, it is very important to follow up with your health care provider. CAUSES  Chest pain can be caused by:  Heartburn.  Pneumonia or bronchitis.  Anxiety or stress.  Inflammation around your heart (pericarditis) or lung (pleuritis or pleurisy).  A blood clot in your lung.  A collapsed lung (pneumothorax). It can develop suddenly on its own (spontaneous pneumothorax) or from trauma  to the chest.  Shingles infection (varicella-zoster virus).  Heart attack.  Damage to the bones, muscles, and cartilage that make up your chest wall. This can include:  Bruised bones due to injury.  Strained muscles or cartilage due to frequent or repeated coughing or overwork.  Fracture to one or more ribs.  Sore cartilage due to inflammation (costochondritis). RISK FACTORS  Risk factors for chest pain may include:  Activities that increase your risk for trauma or injury to your chest.  Respiratory infections or conditions that cause frequent coughing.  Medical conditions or overeating that can cause heartburn.  Heart disease or family history of heart disease.  Conditions or health behaviors that increase your risk of developing a blood clot.  Having had chicken pox (varicella zoster). SIGNS AND SYMPTOMS Chest pain can feel like:  Burning or tingling on the surface of your chest or deep in your chest.  Crushing, pressure, aching, or squeezing pain.  Dull or sharp pain that is worse when you move,  cough, or take a deep breath.  Pain that is also felt in your back, neck, shoulder, or arm, or pain that spreads to any of these areas. Your chest pain may come and go, or it may stay constant. DIAGNOSIS Lab tests or other studies may be needed to find the cause of your pain. Your health care provider may have you take a test called an ambulatory ECG (electrocardiogram). An ECG records your heartbeat patterns at the time the test is performed. You may also have other tests, such as:  Transthoracic echocardiogram (TTE). During echocardiography, sound waves are used to create a picture of all of the heart structures and to look at how blood flows through your heart.  Transesophageal echocardiogram (TEE).This is a more advanced imaging test that obtains images from inside your body. It allows your health care provider to see your heart in finer detail.  Cardiac monitoring. This  allows your health care provider to monitor your heart rate and rhythm in real time.  Holter monitor. This is a portable device that records your heartbeat and can help to diagnose abnormal heartbeats. It allows your health care provider to track your heart activity for several days, if needed.  Stress tests. These can be done through exercise or by taking medicine that makes your heart beat more quickly.  Blood tests.  Imaging tests. TREATMENT  Your treatment depends on what is causing your chest pain. Treatment may include:  Medicines. These may include:  Acid blockers for heartburn.  Anti-inflammatory medicine.  Pain medicine for inflammatory conditions.  Antibiotic medicine, if an infection is present.  Medicines to dissolve blood clots.  Medicines to treat coronary artery disease.  Supportive care for conditions that do not require medicines. This may include:  Resting.  Applying heat or cold packs to injured areas.  Limiting activities until pain decreases. HOME CARE INSTRUCTIONS  If you were prescribed an antibiotic medicine, finish it all even if you start to feel better.  Avoid any activities that bring on chest pain.  Do not use any tobacco products, including cigarettes, chewing tobacco, or electronic cigarettes. If you need help quitting, ask your health care provider.  Do not drink alcohol.  Take medicines only as directed by your health care provider.  Keep all follow-up visits as directed by your health care provider. This is important. This includes any further testing if your chest pain does not go away.  If heartburn is the cause for your chest pain, you may be told to keep your head raised (elevated) while sleeping. This reduces the chance that acid will go from your stomach into your esophagus.  Make lifestyle changes as directed by your health care provider. These may include:  Getting regular exercise. Ask your health care provider to suggest  some activities that are safe for you.  Eating a heart-healthy diet. A registered dietitian can help you to learn healthy eating options.  Maintaining a healthy weight.  Managing diabetes, if necessary.  Reducing stress. SEEK MEDICAL CARE IF:  Your chest pain does not go away after treatment.  You have a rash with blisters on your chest.  You have a fever. SEEK IMMEDIATE MEDICAL CARE IF:   Your chest pain is worse.  You have an increasing cough, or you cough up blood.  You have severe abdominal pain.  You have severe weakness.  You faint.  You have chills.  You have sudden, unexplained chest discomfort.  You have sudden, unexplained discomfort in your arms,  back, neck, or jaw.  You have shortness of breath at any time.  You suddenly start to sweat, or your skin gets clammy.  You feel nauseous or you vomit.  You suddenly feel light-headed or dizzy.  Your heart begins to beat quickly, or it feels like it is skipping beats. These symptoms may represent a serious problem that is an emergency. Do not wait to see if the symptoms will go away. Get medical help right away. Call your local emergency services (911 in the [place] Do not drive yourself to the hospital.   This information is not intended to replace advice given to you by your health care provider. Make sure you discuss any questions you have with your health care provider.   Document Released: 06/28/2005 Document Revised: 10/09/2014 Document Reviewed: 04/24/2014 Elsevier Interactive Patient Education 2016 Else[employer] Inc[employer]  Weakness Weakness is a lack of strength. It may be felt all over the body (generalized) or in one specific part of the body (focal). Some causes of weakness can be serious. You may need further medical evaluation, especially if you are elderly or you have a history of immunosuppression (such as chemotherapy or HIV), kidney disease, heart disease, or diabetes. CAUSES  Weakness can be caused by  many different things, including:  Infection.  Physical exhaustion.  Internal bleeding or other blood loss that results in a lack of red blood cells (anemia).  Dehydration. This cause is more common in elderly people.  Side effects or electrolyte abnormalities from medicines, such as pain medicines or sedatives.  Emotional distress, anxiety, or depression.  Circulation problems, especially severe peripheral arterial disease.  Heart disease, such as rapid atrial fibrillation, bradycardia, or heart failure.  Nervous system disorders, such as Guillain-Barr syndrome, multiple sclerosis, or stroke. DIAGNOSIS  To find the cause of your weakness, your caregiver will take your history and perform a physical exam. Lab tests or X-rays may also be ordered, if needed. TREATMENT  Treatment of weakness depends on the cause of your symptoms and can vary greatly. HOME CARE INSTRUCTIONS   Rest as needed.  Eat a well-balanced diet.  Try to get some exercise every day.  Only take over-the-counter or prescription medicines as directed by your caregiver. SEEK MEDICAL CARE IF:   Your weakness seems to be getting worse or spreads to other parts of your body.  You develop new aches or pains. SEEK IMMEDIATE MEDICAL CARE IF:   You cannot perform your normal daily activities, such as getting dressed and feeding yourself.  You cannot walk up and down stairs, or you feel exhausted when you do so.  You have shortness of breath or chest pain.  You have difficulty moving parts of your body.  You have weakness in only one area of the body or on only one side of the body.  You have a fever.  You have trouble speaking or swallowing.  You cannot control your bladder or bowel movements.  You have black or bloody vomit or stools. MAKE SURE YOU:  Understand these instructions.  Will watch your condition.  Will get help right away if you are not doing well or get worse.   This information is  not intended to replace advice given to you by your health care provider. Make sure you discuss any questions you have with your health care provider.   Document Released: 09/18/2005 Document Revised: 03/19/2012 Document Reviewed: 11/17/2011 Elsevier Interactive Patient Education 2016 [employer] Inc[employer]  Paresthesia Paresthesia is a burning or prickling feeling.  This feeling can happen in any part of the body. It often happens in the hands, arms, legs, or feet. Usually, it is not painful. In most cases, the feeling goes away in a short time and is not a sign of a serious problem. HOME CARE  Avoid drinking alcohol.  Try massage or needle therapy (acupuncture) to help with your problems.  Keep all follow-up visits as told by your doctor. This is important. GET HELP IF:  You keep on having episodes of paresthesia.  Your burning or prickling feeling gets worse when you walk.  You have pain or cramps.  You feel dizzy.  You have a rash. GET HELP RIGHT AWAY IF:  You feel weak.  You have trouble walking or moving.  You have problems speaking, understanding, or seeing.  You feel confused.  You cannot control when you pee (urinate) or poop (bowel movement).  You lose feeling (numbness) after an injury.  You pass out (faint).   This information is not intended to replace advice given to you by your health care provider. Make sure you discuss any questions you have with your health care provider.   Document Released: 08/31/2008 Document Revised: 02/02/2015 Document Reviewed: 09/14/2014 Elsevier Interactive Patient Education [employer].

## 2016-03-08 ENCOUNTER — Emergency Department
Admission: EM | Admit: 2016-03-08 | Discharge: 2016-03-08 | Disposition: A | Discharge status: Home / Self Care | Payer: BLUE CROSS/BLUE SHIELD | Attending: Emergency Medicine | Admitting: Emergency Medicine

## 2016-03-08 ENCOUNTER — Ambulatory Visit
Admission: RE | Admit: 2016-03-08 | Discharge: 2016-03-08 | Disposition: A | Discharge status: Home / Self Care | Payer: BLUE CROSS/BLUE SHIELD | Source: Ambulatory Visit | Attending: Surgery | Admitting: Surgery

## 2016-03-08 ENCOUNTER — Encounter: Payer: Self-pay | Admitting: Emergency Medicine

## 2016-03-08 ENCOUNTER — Ambulatory Visit
Admission: EM | Admit: 2016-03-08 | Discharge: 2016-03-08 | Disposition: A | Discharge status: Hospice | Payer: BLUE CROSS/BLUE SHIELD | Attending: Family Medicine | Admitting: Family Medicine

## 2016-03-08 DIAGNOSIS — Z8673 Personal history of transient ischemic attack (TIA), and cerebral infarction without residual deficits: Secondary | ICD-10-CM | POA: Insufficient documentation

## 2016-03-08 DIAGNOSIS — R339 Retention of urine, unspecified: Secondary | ICD-10-CM

## 2016-03-08 DIAGNOSIS — Z1231 Encounter for screening mammogram for malignant neoplasm of breast: Secondary | ICD-10-CM | POA: Insufficient documentation

## 2016-03-08 DIAGNOSIS — Z79899 Other long term (current) drug therapy: Secondary | ICD-10-CM | POA: Diagnosis not present

## 2016-03-08 DIAGNOSIS — Z8679 Personal history of other diseases of the circulatory system: Secondary | ICD-10-CM | POA: Diagnosis not present

## 2016-03-08 DIAGNOSIS — Z7982 Long term (current) use of aspirin: Secondary | ICD-10-CM | POA: Insufficient documentation

## 2016-03-08 DIAGNOSIS — A419 Sepsis, unspecified organism: Secondary | ICD-10-CM | POA: Diagnosis not present

## 2016-03-08 DIAGNOSIS — J449 Chronic obstructive pulmonary disease, unspecified: Secondary | ICD-10-CM | POA: Diagnosis not present

## 2016-03-08 DIAGNOSIS — Z87891 Personal history of nicotine dependence: Secondary | ICD-10-CM | POA: Diagnosis not present

## 2016-03-08 DIAGNOSIS — N390 Urinary tract infection, site not specified: Secondary | ICD-10-CM | POA: Diagnosis not present

## 2016-03-08 DIAGNOSIS — R310 Gross hematuria: Secondary | ICD-10-CM | POA: Insufficient documentation

## 2016-03-08 DIAGNOSIS — Z85820 Personal history of malignant melanoma of skin: Secondary | ICD-10-CM | POA: Insufficient documentation

## 2016-03-08 DIAGNOSIS — Z951 Presence of aortocoronary bypass graft: Secondary | ICD-10-CM | POA: Insufficient documentation

## 2016-03-08 DIAGNOSIS — N644 Mastodynia: Secondary | ICD-10-CM

## 2016-03-08 DIAGNOSIS — F329 Major depressive disorder, single episode, unspecified: Secondary | ICD-10-CM | POA: Diagnosis not present

## 2016-03-08 LAB — CBC WITH DIFFERENTIAL/PLATELET
Basophils Absolute: 0 10*3/uL (ref 0–0.1)
Basophils Relative: 1 %
EOS ABS: 0.1 10*3/uL (ref 0–0.7)
Eosinophils Relative: 2 %
HEMATOCRIT: 46.6 % (ref 35.0–47.0)
HEMOGLOBIN: 16 g/dL (ref 12.0–16.0)
LYMPHS ABS: 1.6 10*3/uL (ref 1.0–3.6)
Lymphocytes Relative: 25 %
MCH: 32.4 pg (ref 26.0–34.0)
MCHC: 34.3 g/dL (ref 32.0–36.0)
MCV: 94.6 fL (ref 80.0–100.0)
MONOS PCT: 12 %
Monocytes Absolute: 0.8 10*3/uL (ref 0.2–0.9)
NEUTROS ABS: 4 10*3/uL (ref 1.4–6.5)
NEUTROS PCT: 60 %
Platelets: 284 10*3/uL (ref 150–440)
RBC: 4.93 MIL/uL (ref 3.80–5.20)
RDW: 14.4 % (ref 11.5–14.5)
WBC: 6.5 10*3/uL (ref 3.6–11.0)

## 2016-03-08 LAB — COMPREHENSIVE METABOLIC PANEL
ALT: 19 U/L (ref 14–54)
ANION GAP: 12 (ref 5–15)
AST: 22 U/L (ref 15–41)
Albumin: 4.6 g/dL (ref 3.5–5.0)
Alkaline Phosphatase: 116 U/L (ref 38–126)
BUN: 18 mg/dL (ref 6–20)
CHLORIDE: 96 mmol/L — AB (ref 101–111)
CO2: 26 mmol/L (ref 22–32)
CREATININE: 0.82 mg/dL (ref 0.44–1.00)
Calcium: 10 mg/dL (ref 8.9–10.3)
Glucose, Bld: 104 mg/dL — ABNORMAL HIGH (ref 65–99)
POTASSIUM: 4 mmol/L (ref 3.5–5.1)
SODIUM: 134 mmol/L — AB (ref 135–145)
Total Bilirubin: 1.3 mg/dL — ABNORMAL HIGH (ref 0.3–1.2)
Total Protein: 7.5 g/dL (ref 6.5–8.1)

## 2016-03-08 LAB — URINALYSIS COMPLETE WITH MICROSCOPIC (ARMC ONLY)
BACTERIA UA: NONE SEEN
Bilirubin Urine: NEGATIVE
Glucose, UA: NEGATIVE mg/dL
HGB URINE DIPSTICK: NEGATIVE
KETONES UR: NEGATIVE mg/dL
Leukocytes, UA: NEGATIVE
NITRITE: NEGATIVE
PH: 6 (ref 5.0–8.0)
PROTEIN: NEGATIVE mg/dL
SPECIFIC GRAVITY, URINE: 1.004 — AB (ref 1.005–1.030)
WBC UA: NONE SEEN WBC/hpf (ref 0–5)

## 2016-03-08 MED ORDER — TAMSULOSIN HCL 0.4 MG PO CAPS: 0.4000 mg | ORAL_CAPSULE | Freq: Every day | ORAL | Status: DC

## 2016-03-08 MED ORDER — PHENAZOPYRIDINE HCL 200 MG PO TABS
200.0000 mg | ORAL_TABLET | Freq: Once | ORAL | Status: AC
  Administered 2016-03-08: 200 mg via ORAL
  Filled 2016-03-08 (×2): qty 1

## 2016-03-08 NOTE — ED Notes (Signed)
I&O cath performed, about 350 mls of clear, light yellow urine with no odor obtained. Pt reports still feels full and having lower abd/pelvic pain and states "I'm still full of fluid" but no more came out of cath even when pt beared down, or nurse pressed on lower abd.  Dr. Posey Pronto notified. Pt tolerated well.

## 2016-03-08 NOTE — ED Notes (Signed)
MD at bedside reassessed pt.

## 2016-03-08 NOTE — ED Provider Notes (Signed)
[facility] Emergency Department Provider Note  ____________________________________________   I have reviewed the triage vital signs and the nursing notes.   HISTORY  Chief Complaint Urinary Retention    HPI Kirsten Castro is a 55 y.o. female presents today complaining ofurinary retention. Patient has had this before. She did have a urine catheter recently placed. Patient has no incontinence of bladder. She denies any focal numbness or weakness. Patient has had this before. Patient has multiple other medical problems. I do note that since February of this year she has had an MRI of her lumbar spine which showed a partial 15 % compression fracture but no stenosis, she has had for x-rays of her lumbar spine a CT of her lumbar spine in addition to multiple chest x-rays, CT head, CT C-spine, 2 CTs her abdomen and pelvis, and CT scans of her chest 2. These showed any acute pathology aside from that mentioned. She has also had a negative right upper quadrant ultrasound. In short, patient has had multiple recurrent negative radiographic high radiation tests over the last several months for multiple different complaints. Today, she states that she is having urinary retention. Patient has no other neurologic complaints.      Past Medical History  Diagnosis Date  . Stroke ([place])   . Coronary artery disease   . COPD (chronic obstructive pulmonary disease) ([place])   . Myocardial infarction ([place])   . Anginal pain ([place])   . Hypertension   . Cancer [facility])     melanoma skin cancer    Patient Active Problem List   Diagnosis Date Noted  . Temporary cerebral vascular dysfunction 02/03/2016  . Peripheral vascular disease ([place]) 02/03/2016  . BP (high blood pressure) 02/03/2016  . HLD (hyperlipidemia) 02/03/2016  . Clinical depression 02/03/2016  . Carotid artery narrowing 02/03/2016  . 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 Malignant  neoplastic disease ([place]) 02/03/2016  . Anxiety 02/03/2016  . Abdominal pain, generalized   . Nausea with vomiting   . Gastritis   . Esophageal candidiasis ([place])   . Noninfectious diarrhea   . Intractable pain 01/21/2016  . Upper abdominal pain   . Nausea and vomiting in adult   . Ischemic colitis ([place]) 01/01/2016  . Colitis 12/15/2015  . GI bleed 12/15/2015  . Acute inflammation of the pancreas 12/06/2015  . Vitamin B12 deficiency anemia 02/25/2015  . Absolute anemia 02/25/2015  . Carotid stenosis 02/10/2015  . Personal history of other specified conditions 11/27/2014  . Osteomyelitis ([place]) 11/27/2014  . H/O coronary artery bypass surgery 05/18/2014    Past Surgical History  Procedure Laterality Date  . Coronary artery bypass graft    . Peripheral vascular catheterization N/A 02/10/2015    Procedure: Carotid PTA/Stent Intervention;  Surgeon: Katha Cabal, MD;  Location: [place] [facility];  Service: Cardiovascular;  Laterality: N/A;  . Stent placement vascular ([facility] hx)    . Peripheral vascular catheterization N/A 12/17/2015    Procedure: Visceral Angiography;  Surgeon: Katha Cabal, MD;  Location: [place] [facility];  Service: Cardiovascular;  Laterality: N/A;  . Peripheral vascular catheterization N/A 12/17/2015    Procedure: Visceral Artery Intervention;  Surgeon: Katha Cabal, MD;  Location: [place] [facility];  Service: Cardiovascular;  Laterality: N/A;  . Abdominal hysterectomy    . Other surgical history  Jul 11 2014    sternum removal  . Nose surgery      cancer removal  . Esophagogastroduodenoscopy (  egd) with propofol N/A 01/24/2016    Procedure: ESOPHAGOGASTRODUODENOSCOPY (EGD) WITH PROPOFOL;  Surgeon: Lucilla Lame, MD;  Location: [facility] ENDOSCOPY;  Service: Endoscopy;  Laterality: N/A;  . Colonoscopy with propofol N/A 01/24/2016    Procedure: COLONOSCOPY WITH PROPOFOL;  Surgeon: Lucilla Lame, MD;  Location: [facility] ENDOSCOPY;  Service: Endoscopy;   Laterality: N/A;  . Kyphoplasty N/A 02/08/2016    Procedure: KYPHOPLASTY L1;  Surgeon: Hessie Knows, MD;  Location: [facility] ORS;  Service: Orthopedics;  Laterality: N/A;  . Breast biopsy Left 6 2017    results not in yet    Current Outpatient Rx  Name  Route  Sig  Dispense  Refill  . acetaminophen (TYLENOL) 500 MG tablet   Oral   Take 1,000 mg by mouth every 6 (six) hours as needed for mild pain or headache.         . ALPRAZolam (XANAX) 1 MG tablet   Oral   Take 1 mg by mouth 3 (three) times daily.         Marland Kitchen aspirin 81 MG EC tablet   Oral   Take 81 mg by mouth daily.          Marland Kitchen atorvastatin (LIPITOR) 80 MG tablet   Oral   Take 80 mg by mouth daily.         . carvedilol (COREG) 12.5 MG tablet   Oral   Take 12.5 mg by mouth 2 (two) times daily with a meal.         . cetirizine (ZYRTEC) 10 MG tablet   Oral   Take 10 mg by mouth daily. Reported on 02/08/2016         . clopidogrel (PLAVIX) 75 MG tablet               . clotrimazole (LOTRIMIN) 1 % cream      APP EXT AA BID      0   . fenofibrate (TRICOR) 145 MG tablet   Oral   Take by mouth.         . Fluticasone-Salmeterol (ADVAIR) 250-50 MCG/DOSE AEPB   Inhalation   Inhale 1 puff into the lungs 2 (two) times daily.         . furosemide (LASIX) 40 MG tablet   Oral   Take by mouth.         Marland Kitchen lisinopril (ZESTRIL) 10 MG tablet   Oral   Take 1 tablet (10 mg total) by mouth daily.   60 tablet   1   . Magnesium 250 MG TABS   Oral   Take 250 mg by mouth 2 (two) times daily.         Marland Kitchen nystatin (MYCOSTATIN) 100000 UNIT/ML suspension   Oral   Take 5 mLs by mouth daily.      0   . oxyCODONE (OXY IR/ROXICODONE) 5 MG immediate release tablet   Oral   Take 1 tablet by mouth every 12 (twelve) hours.         . Oxycodone HCl 20 MG TABS   Oral   Take 1 tablet by mouth every 8 (eight) hours.         Marland Kitchen oxyCODONE-acetaminophen (PERCOCET/ROXICET) 5-325 MG tablet   Oral   Take 1 tablet by mouth 1  day or 1 dose.         . pantoprazole (PROTONIX) 40 MG tablet   Oral   Take 1 tablet (40 mg total) by mouth 2 (two) times daily.   [place]  tablet   1   . potassium chloride (K-DUR,KLOR-CON) 10 MEQ tablet   Oral   Take 10 mEq by mouth 2 (two) times daily.         . traZODone (DESYREL) 50 MG tablet   Oral   Take 50 mg by mouth at bedtime as needed for sleep.            Allergies Ferrous sulfate  Family History  Problem Relation Age of Onset  . Breast cancer Sister   . CAD Father   . CAD Brother     Social History Social History  Substance Use Topics  . Smoking status: Former Smoker    Quit date: 08/01/2012  . Smokeless tobacco: Never Used  . Alcohol Use: No    Review of Systems Constitutional: No fever/chills Eyes: No visual changes. ENT: No sore throat. No stiff neck no neck pain Cardiovascular: Denies chest pain. Respiratory: Denies shortness of breath. Gastrointestinal:   no vomiting.  No diarrhea.  No constipation. Genitourinary: Negative for dysuria. Musculoskeletal: Negative lower extremity swelling Skin: Negative for rash. Neurological: Negative for headaches, focal weakness or numbness. 10-point ROS otherwise negative.  ____________________________________________   PHYSICAL EXAM:  VITAL SIGNS: ED Triage Vitals  Enc Vitals Group     BP 03/08/16 1739 101/57 mmHg     Pulse Rate 03/08/16 1739 80     Resp 03/08/16 1739 20     Temp 03/08/16 1739 98 F (36.7 C)     Temp Source 03/08/16 1739 Oral     SpO2 03/08/16 1739 98 %     Weight 03/08/16 1739 150 lb (68.04 kg)     Height 03/08/16 1739 5\' 1"  (1.549 m)     Head Cir --      Peak Flow --      Pain Score 03/08/16 1937 10     Pain Loc --      Pain Edu? --      Excl. in [place]? --     Constitutional: Alert and oriented. Well appearing and in no acute distress. Eyes: Conjunctivae are normal. PERRL. EOMI. Head: Atraumatic. Nose: No congestion/rhinnorhea. Mouth/Throat: Mucous membranes are moist.   Oropharynx non-erythematous. Neck: No stridor.   Nontender with no meningismus Cardiovascular: Normal rate, regular rhythm. Grossly normal heart sounds.  Good peripheral circulation. Respiratory: Normal respiratory effort.  No retractions. Lungs CTAB. Abdominal: Soft and nontender. No distention. No guarding no rebound Back:  There is no focal tenderness or step off there is no midline tenderness there are no lesions noted. there is no CVA tenderness Musculoskeletal: No lower extremity tenderness. No joint effusions, no DVT signs strong distal pulses no edema Neurologic:  Normal speech and language. No gross focal neurologic deficits are appreciated.  Skin:  Skin is warm, dry and intact. No rash noted. Psychiatric: Mood and affect are normal. Speech and behavior are normal.  ____________________________________________   LABS (all labs ordered are listed, but only abnormal results are displayed)  Labs Reviewed  URINALYSIS COMPLETEWITH MICROSCOPIC ([facility] ONLY) - Abnormal; Notable for the following:    Color, Urine COLORLESS (*)    APPearance CLEAR (*)    Specific Gravity, Urine 1.004 (*)    Squamous Epithelial / LPF 0-5 (*)    All other components within normal limits  COMPREHENSIVE METABOLIC PANEL - Abnormal; Notable for the following:    Sodium 134 (*)    Chloride 96 (*)    Glucose, Bld 104 (*)    Total Bilirubin 1.3 (*)  All other components within normal limits  URINE CULTURE  CBC WITH DIFFERENTIAL/PLATELET   ____________________________________________  EKG  I personally interpreted any EKGs ordered by me or triage  ____________________________________________  RADIOLOGY  I reviewed any imaging ordered by me or triage that were performed during my shift and, if possible, patient and/or family made aware of any abnormal findings. ____________________________________________   PROCEDURES  Procedure(s) performed: None  Critical Care performed:  None  ____________________________________________   INITIAL IMPRESSION / ASSESSMENT AND PLAN / ED COURSE  Pertinent labs & imaging results that were available during my care of the patient were reviewed by me and considered in my medical decision making (see chart for details).  Patient with no saddle anesthesia declines rectal exam, no evidence of cauda equina syndrome, she has had urinary retention in the past she states. She did urinate on her own here with no difficulty and afterwards a Foley was placed which showed a partially 10 cc of residual urines or post void residual was minimal. She did, however, have several 100 cc of urine in her bladder. We will keep her with a Foley catheter as this is her second visit in the last few days for this. It is unclear exactly the cause of it. I do not think any emergent imaging is required, patient and, as noted above, has had very extensive magnetic resonating injury of her back as well as multiple CTs of her chest abdomen pelvis head and C-spine and lumbar spine and all of them have shown no pathology likely cause cauda equina syndrome. Patient has had no recent falls. She has no complete back pain. Patient is on chronic narcotics which could certainly cause urinary retention she has had 2 different prescriptions filled the last few weeks for multiple different narcotic pills. No evidence of urinary tract infection. Blood work is reassuring. We will send her home with a Foley catheter in place follow up with urology and her neurologist. ____________________________________________   FINAL CLINICAL IMPRESSION(S) / ED DIAGNOSES  Final diagnoses:  None      This chart was dictated using voice recognition software.  Despite best efforts to proofread,  errors can occur which can change meaning.     Schuyler Amor, MD 03/08/16 2140

## 2016-03-08 NOTE — ED Notes (Signed)
Bladder scan done in triage, 317mL of urine in bladder

## 2016-03-08 NOTE — ED Notes (Signed)
Pt arrives to ER via POV c/o urinary retention. Pt came from Urgent Care where they did in and out catheter to drain urine. Pt last peed on her own earlier today, but very little. Pt alert and oriented X4, active, cooperative, pt in NAD. RR even and unlabored, color WNL.  Bladder scan showed 331ml

## 2016-03-08 NOTE — ED Notes (Signed)
Foley bag changed to a leg bag prior to discharge.

## 2016-03-08 NOTE — ED Provider Notes (Signed)
CSN: [id number]     Arrival date & time 03/08/16  1610 History   First MD Initiated Contact with Patient 03/08/16 1631     Chief Complaint  Patient presents with  . Urinary Retention   (Consider location/radiation/quality/duration/timing/severity/associated sxs/prior Treatment) HPI: Patient presents today with symptoms of urinary retention. Patient states that she has only been able to urinate small amounts since yesterday when she had an in and out catheterization done at the ER. Patient has been feeling nauseous but denies any fever. She denies any symptoms such as dysuria, flank pain, diarrhea, constipation. She denies any history of kidney stones in the past. She states that her symptoms started after having an in and out catheter done at the ER yesterday. She noticed a small amount of blood from the area when wiping earlier today. Patient admits to history of hysterectomy in the past. She denies any chest pain or shortness of breath.  Past Medical History  Diagnosis Date  . Stroke ([place])   . Coronary artery disease   . COPD (chronic obstructive pulmonary disease) ([place])   . Myocardial infarction ([place])   . Anginal pain ([place])   . Hypertension   . Cancer [facility])     melanoma skin cancer   Past Surgical History  Procedure Laterality Date  . Coronary artery bypass graft    . Peripheral vascular catheterization N/A 02/10/2015    Procedure: Carotid PTA/Stent Intervention;  Surgeon: Katha Cabal, MD;  Location: [place] [facility];  Service: Cardiovascular;  Laterality: N/A;  . Stent placement vascular ([facility] hx)    . Peripheral vascular catheterization N/A 12/17/2015    Procedure: Visceral Angiography;  Surgeon: Katha Cabal, MD;  Location: [place] [facility];  Service: Cardiovascular;  Laterality: N/A;  . Peripheral vascular catheterization N/A 12/17/2015    Procedure: Visceral Artery Intervention;  Surgeon: Katha Cabal, MD;  Location: [place] [facility];  Service:  Cardiovascular;  Laterality: N/A;  . Abdominal hysterectomy    . Other surgical history  Jul 11 2014    sternum removal  . Nose surgery      cancer removal  . Esophagogastroduodenoscopy (egd) with propofol N/A 01/24/2016    Procedure: ESOPHAGOGASTRODUODENOSCOPY (EGD) WITH PROPOFOL;  Surgeon: Lucilla Lame, MD;  Location: [facility] ENDOSCOPY;  Service: Endoscopy;  Laterality: N/A;  . Colonoscopy with propofol N/A 01/24/2016    Procedure: COLONOSCOPY WITH PROPOFOL;  Surgeon: Lucilla Lame, MD;  Location: [facility] ENDOSCOPY;  Service: Endoscopy;  Laterality: N/A;  . Kyphoplasty N/A 02/08/2016    Procedure: KYPHOPLASTY L1;  Surgeon: Hessie Knows, MD;  Location: [facility] ORS;  Service: Orthopedics;  Laterality: N/A;  . Breast biopsy Left 6 2017    results not in yet   Family History  Problem Relation Age of Onset  . Breast cancer Sister   . CAD Father   . CAD Brother    Social History  Substance Use Topics  . Smoking status: Former Smoker    Quit date: 08/01/2012  . Smokeless tobacco: Never Used  . Alcohol Use: No   OB History    No data available     Review of Systems: Negative except mentioned above.  Allergies  Ferrous sulfate  Home Medications   Prior to Admission medications   Medication Sig Start Date End Date Taking? Authorizing Provider  acetaminophen (TYLENOL) 500 MG tablet Take 1,000 mg by mouth every 6 (six) hours as needed for mild pain or headache.    Historical Provider, MD  ALPRAZolam Duanne Moron)  1 MG tablet Take 1 mg by mouth 3 (three) times daily.    Historical Provider, MD  aspirin 81 MG EC tablet Take 81 mg by mouth daily.     Historical Provider, MD  atorvastatin (LIPITOR) 80 MG tablet Take 80 mg by mouth daily.    Historical Provider, MD  carvedilol (COREG) 12.5 MG tablet Take 12.5 mg by mouth 2 (two) times daily with a meal.    Historical Provider, MD  cetirizine (ZYRTEC) 10 MG tablet Take 10 mg by mouth daily. Reported on 02/08/2016    Historical Provider, MD  clopidogrel  (PLAVIX) 75 MG tablet  02/02/16   Historical Provider, MD  clotrimazole (LOTRIMIN) 1 % cream APP EXT AA BID 12/02/15   Historical Provider, MD  fenofibrate (TRICOR) 145 MG tablet Take by mouth.    Historical Provider, MD  Fluticasone-Salmeterol (ADVAIR) 250-50 MCG/DOSE AEPB Inhale 1 puff into the lungs 2 (two) times daily.    Historical Provider, MD  furosemide (LASIX) 40 MG tablet Take by mouth.    Historical Provider, MD  lisinopril (ZESTRIL) 10 MG tablet Take 1 tablet (10 mg total) by mouth daily. 01/25/16   Henreitta Leber, MD  Magnesium 250 MG TABS Take 250 mg by mouth 2 (two) times daily.    Historical Provider, MD  nystatin (MYCOSTATIN) 100000 UNIT/ML suspension Take 5 mLs by mouth daily. 01/25/16   Historical Provider, MD  oxyCODONE (OXY IR/ROXICODONE) 5 MG immediate release tablet Take 1 tablet by mouth every 12 (twelve) hours. 02/23/16   Historical Provider, MD  Oxycodone HCl 20 MG TABS Take 1 tablet by mouth every 8 (eight) hours. 12/28/15   Historical Provider, MD  oxyCODONE-acetaminophen (PERCOCET/ROXICET) 5-325 MG tablet Take 1 tablet by mouth 1 day or 1 dose. 12/18/15   Historical Provider, MD  pantoprazole (PROTONIX) 40 MG tablet Take 1 tablet (40 mg total) by mouth 2 (two) times daily. 01/25/16   Henreitta Leber, MD  potassium chloride (K-DUR,KLOR-CON) 10 MEQ tablet Take 10 mEq by mouth 2 (two) times daily.    Historical Provider, MD  traZODone (DESYREL) 50 MG tablet Take 50 mg by mouth at bedtime as needed for sleep.     Historical Provider, MD   Meds Ordered and Administered this Visit  Medications - No data to display  BP 116/85 mmHg  Pulse 85  Temp(Src) 97.8 F (36.6 C) (Oral)  Resp 22  Ht 5\' 1"  (1.549 m)  Wt 150 lb (68.04 kg)  BMI 28.36 kg/m2  SpO2 99% No data found.   Physical Exam   GENERAL: discomfort, moaning  RESP: CTA B CARD: RRR ABD: +BS, soft, mild discomfort in the suprapubic area, mild guarding of the area, no bruits appreciated, no flank tenderness   ED  Course  Procedures (including critical care time)    MDM  A/P: Urinary retention- Patient still feels abdominal discomfort and bloating even after in and out catheter was done in the office today (approx. 350 mls). I explained to her that this could be a result of spasm. Patient still feels like she needs to urinate but can't. Would recommend patient go to the ER now for further evaluation with imaging and treatment. Husband is here to take her there now. Will call ER to inform them patient is coming.     Paulina Fusi, MD 03/08/16 [phone]

## 2016-03-08 NOTE — Discharge Instructions (Signed)
Acute Urinary Retention, Female °Acute urinary retention is the temporary inability to urinate. This is an uncommon problem in women. It can be caused by: °· Infection. °· A side effect of a medicine. °· A problem in a nearby organ that presses or squeezes on the bladder or the urethra (the tube that drains the bladder). °· Psychological problems. °·  Surgery on your bladder, urethra, or pelvic organs that causes obstruction to the outflow of urine from your bladder. °HOME CARE INSTRUCTIONS  °If you are sent home with a Foley catheter and a drainage system, you will need to discuss the best course of action with your health care provider. While the catheter is in, maintain a good intake of fluids. Keep the drainage bag emptied and lower than your catheter. This is so that contaminated urine will not flow back into your bladder, which could lead to a urinary tract infection. °There are two main types of drainage bags. One is a large bag that usually is used at night. It has a good capacity that will allow you to sleep through the night without having to empty it. The second type is called a leg bag. It has a smaller capacity so it needs to be emptied more frequently. However, the main advantage is that it can be attached by a leg strap and goes underneath your clothing, allowing you the freedom to move about or leave your home. °Only take over-the-counter or prescription medicines for pain, discomfort, or fever as directed by your health care provider.  °SEEK MEDICAL CARE IF: °· You develop a low-grade fever. °· You experience spasms or leakage of urine with the spasms. °SEEK IMMEDIATE MEDICAL CARE IF:  °· You develop chills or fever. °· Your catheter stops draining urine. °· Your catheter falls out. °· You start to develop increased bleeding that does not respond to rest and increased fluid intake. °MAKE SURE YOU: °· Understand these instructions. °· Will watch your condition. °· Will get help right away if you are  not doing well or get worse. °  °This information is not intended to replace advice given to you by your health care provider. Make sure you discuss any questions you have with your health care provider. °  °Document Released: 09/17/2006 Document Revised: 02/02/2015 Document Reviewed: 02/27/2013 °Elsevier Interactive Patient Education ©2016 [employer] ° °

## 2016-03-08 NOTE — ED Notes (Signed)
Pt reports unable to empty bladder fully, able to urinate small amounts but cannot empty fully, has lower abd pain, headache and pain in chest. Pt walking bent over and moaning. Pt having nausea, given emesis bag in triage.

## 2016-03-08 NOTE — ED Notes (Signed)
Discharge instructions reviewed with patient. Questions fielded by this RN. Patient verbalizes understanding of instructions. Patient discharged home in stable condition per [facility] MD . No acute distress noted at time of discharge.

## 2016-03-09 ENCOUNTER — Emergency Department: Payer: BLUE CROSS/BLUE SHIELD

## 2016-03-09 ENCOUNTER — Inpatient Hospital Stay
Admission: EM | Admit: 2016-03-09 | Discharge: 2016-03-11 | DRG: 872 | Disposition: A | Discharge status: Home / Self Care | Payer: BLUE CROSS/BLUE SHIELD | Attending: Internal Medicine | Admitting: Internal Medicine

## 2016-03-09 ENCOUNTER — Encounter: Payer: Self-pay | Admitting: Emergency Medicine

## 2016-03-09 DIAGNOSIS — Z85820 Personal history of malignant melanoma of skin: Secondary | ICD-10-CM | POA: Diagnosis not present

## 2016-03-09 DIAGNOSIS — K559 Vascular disorder of intestine, unspecified: Secondary | ICD-10-CM | POA: Diagnosis present

## 2016-03-09 DIAGNOSIS — D519 Vitamin B12 deficiency anemia, unspecified: Secondary | ICD-10-CM | POA: Diagnosis present

## 2016-03-09 DIAGNOSIS — R32 Unspecified urinary incontinence: Secondary | ICD-10-CM | POA: Diagnosis present

## 2016-03-09 DIAGNOSIS — E785 Hyperlipidemia, unspecified: Secondary | ICD-10-CM | POA: Diagnosis present

## 2016-03-09 DIAGNOSIS — J449 Chronic obstructive pulmonary disease, unspecified: Secondary | ICD-10-CM | POA: Diagnosis present

## 2016-03-09 DIAGNOSIS — N390 Urinary tract infection, site not specified: Secondary | ICD-10-CM | POA: Diagnosis present

## 2016-03-09 DIAGNOSIS — I739 Peripheral vascular disease, unspecified: Secondary | ICD-10-CM | POA: Diagnosis present

## 2016-03-09 DIAGNOSIS — Z79899 Other long term (current) drug therapy: Secondary | ICD-10-CM | POA: Diagnosis not present

## 2016-03-09 DIAGNOSIS — Z888 Allergy status to other drugs, medicaments and biological substances status: Secondary | ICD-10-CM

## 2016-03-09 DIAGNOSIS — Z23 Encounter for immunization: Secondary | ICD-10-CM | POA: Diagnosis not present

## 2016-03-09 DIAGNOSIS — M549 Dorsalgia, unspecified: Secondary | ICD-10-CM | POA: Diagnosis present

## 2016-03-09 DIAGNOSIS — R1030 Lower abdominal pain, unspecified: Secondary | ICD-10-CM

## 2016-03-09 DIAGNOSIS — Z7982 Long term (current) use of aspirin: Secondary | ICD-10-CM

## 2016-03-09 DIAGNOSIS — Z8249 Family history of ischemic heart disease and other diseases of the circulatory system: Secondary | ICD-10-CM | POA: Diagnosis not present

## 2016-03-09 DIAGNOSIS — I252 Old myocardial infarction: Secondary | ICD-10-CM | POA: Diagnosis not present

## 2016-03-09 DIAGNOSIS — R339 Retention of urine, unspecified: Secondary | ICD-10-CM

## 2016-03-09 DIAGNOSIS — I2510 Atherosclerotic heart disease of native coronary artery without angina pectoris: Secondary | ICD-10-CM

## 2016-03-09 DIAGNOSIS — F329 Major depressive disorder, single episode, unspecified: Secondary | ICD-10-CM | POA: Diagnosis present

## 2016-03-09 DIAGNOSIS — Z951 Presence of aortocoronary bypass graft: Secondary | ICD-10-CM | POA: Diagnosis not present

## 2016-03-09 DIAGNOSIS — A419 Sepsis, unspecified organism: Principal | ICD-10-CM | POA: Diagnosis present

## 2016-03-09 DIAGNOSIS — Z803 Family history of malignant neoplasm of breast: Secondary | ICD-10-CM | POA: Diagnosis not present

## 2016-03-09 DIAGNOSIS — Z79891 Long term (current) use of opiate analgesic: Secondary | ICD-10-CM

## 2016-03-09 DIAGNOSIS — Z87891 Personal history of nicotine dependence: Secondary | ICD-10-CM

## 2016-03-09 DIAGNOSIS — I10 Essential (primary) hypertension: Secondary | ICD-10-CM | POA: Diagnosis present

## 2016-03-09 DIAGNOSIS — Z7902 Long term (current) use of antithrombotics/antiplatelets: Secondary | ICD-10-CM

## 2016-03-09 DIAGNOSIS — G8929 Other chronic pain: Secondary | ICD-10-CM | POA: Diagnosis present

## 2016-03-09 DIAGNOSIS — Z8673 Personal history of transient ischemic attack (TIA), and cerebral infarction without residual deficits: Secondary | ICD-10-CM | POA: Diagnosis not present

## 2016-03-09 LAB — CBC WITH DIFFERENTIAL/PLATELET
BASOS ABS: 0.1 10*3/uL (ref 0–0.1)
BASOS PCT: 1 %
Eosinophils Absolute: 0.1 10*3/uL (ref 0–0.7)
Eosinophils Relative: 1 %
HEMATOCRIT: 45.1 % (ref 35.0–47.0)
HEMOGLOBIN: 14.8 g/dL (ref 12.0–16.0)
Lymphocytes Relative: 20 %
Lymphs Abs: 1.9 10*3/uL (ref 1.0–3.6)
MCH: 32.1 pg (ref 26.0–34.0)
MCHC: 32.9 g/dL (ref 32.0–36.0)
MCV: 97.7 fL (ref 80.0–100.0)
MONOS PCT: 11 %
Monocytes Absolute: 1 10*3/uL — ABNORMAL HIGH (ref 0.2–0.9)
NEUTROS ABS: 6.5 10*3/uL (ref 1.4–6.5)
NEUTROS PCT: 67 %
Platelets: 282 10*3/uL (ref 150–440)
RBC: 4.62 MIL/uL (ref 3.80–5.20)
RDW: 14.4 % (ref 11.5–14.5)
WBC: 9.5 10*3/uL (ref 3.6–11.0)

## 2016-03-09 LAB — BASIC METABOLIC PANEL
ANION GAP: 10 (ref 5–15)
BUN: 32 mg/dL — AB (ref 6–20)
CHLORIDE: 98 mmol/L — AB (ref 101–111)
CO2: 26 mmol/L (ref 22–32)
CREATININE: 1.18 mg/dL — AB (ref 0.44–1.00)
Calcium: 9.5 mg/dL (ref 8.9–10.3)
GFR calc non Af Amer: 52 mL/min — ABNORMAL LOW (ref >60)
GLUCOSE: 140 mg/dL — AB (ref 65–99)
POTASSIUM: 4.2 mmol/L (ref 3.5–5.1)
SODIUM: 134 mmol/L — AB (ref 135–145)

## 2016-03-09 LAB — TROPONIN I

## 2016-03-09 LAB — URINALYSIS COMPLETE WITH MICROSCOPIC (ARMC ONLY)
BACTERIA UA: NONE SEEN
BILIRUBIN URINE: NEGATIVE
Glucose, UA: NEGATIVE mg/dL
Ketones, ur: NEGATIVE mg/dL
Nitrite: POSITIVE — AB
PH: 6 (ref 5.0–8.0)
PROTEIN: 100 mg/dL — AB
Specific Gravity, Urine: 1.012 (ref 1.005–1.030)

## 2016-03-09 LAB — LACTIC ACID, PLASMA: Lactic Acid, Venous: 1.5 mmol/L (ref 0.5–2.0)

## 2016-03-09 LAB — GLUCOSE, CAPILLARY: GLUCOSE-CAPILLARY: 170 mg/dL — AB (ref 65–99)

## 2016-03-09 MED ORDER — SODIUM CHLORIDE 0.9 % IV SOLN
Freq: Once | INTRAVENOUS | Status: AC
  Administered 2016-03-09: 16:00:00 via INTRAVENOUS

## 2016-03-09 MED ORDER — CARVEDILOL 6.25 MG PO TABS
12.5000 mg | ORAL_TABLET | Freq: Two times a day (BID) | ORAL | Status: DC
  Administered 2016-03-10 – 2016-03-11 (×3): 12.5 mg via ORAL
  Filled 2016-03-09: qty 2
  Filled 2016-03-09: qty 4
  Filled 2016-03-09: qty 2

## 2016-03-09 MED ORDER — SODIUM CHLORIDE 0.9 % IV BOLUS (SEPSIS)
1000.0000 mL | Freq: Once | INTRAVENOUS | Status: AC
  Administered 2016-03-09: 1000 mL via INTRAVENOUS

## 2016-03-09 MED ORDER — TAMSULOSIN HCL 0.4 MG PO CAPS
0.4000 mg | ORAL_CAPSULE | Freq: Every day | ORAL | Status: DC
  Administered 2016-03-10 – 2016-03-11 (×2): 0.4 mg via ORAL
  Filled 2016-03-09 (×2): qty 1

## 2016-03-09 MED ORDER — CLOPIDOGREL BISULFATE 75 MG PO TABS
75.0000 mg | ORAL_TABLET | Freq: Every day | ORAL | Status: DC
  Administered 2016-03-10 – 2016-03-11 (×2): 75 mg via ORAL
  Filled 2016-03-09 (×2): qty 1

## 2016-03-09 MED ORDER — VANCOMYCIN HCL IN DEXTROSE 1-5 GM/200ML-% IV SOLN
1000.0000 mg | Freq: Once | INTRAVENOUS | Status: AC
  Administered 2016-03-09: 1000 mg via INTRAVENOUS
  Filled 2016-03-09: qty 200

## 2016-03-09 MED ORDER — ATORVASTATIN CALCIUM 20 MG PO TABS
80.0000 mg | ORAL_TABLET | Freq: Every day | ORAL | Status: DC
  Administered 2016-03-10: 80 mg via ORAL
  Filled 2016-03-09 (×2): qty 4

## 2016-03-09 MED ORDER — SENNOSIDES-DOCUSATE SODIUM 8.6-50 MG PO TABS: 1.0000 | ORAL_TABLET | Freq: Every evening | ORAL | Status: DC | PRN

## 2016-03-09 MED ORDER — PIPERACILLIN-TAZOBACTAM 3.375 G IVPB
3.3750 g | Freq: Three times a day (TID) | INTRAVENOUS | Status: DC
  Administered 2016-03-10 – 2016-03-11 (×5): 3.375 g via INTRAVENOUS
  Filled 2016-03-09 (×9): qty 50

## 2016-03-09 MED ORDER — IOPAMIDOL (ISOVUE-370) INJECTION 76%
100.0000 mL | Freq: Once | INTRAVENOUS | Status: AC | PRN
  Administered 2016-03-09: 100 mL via INTRAVENOUS

## 2016-03-09 MED ORDER — FENOFIBRATE 54 MG PO TABS
54.0000 mg | ORAL_TABLET | Freq: Every day | ORAL | Status: DC
  Administered 2016-03-10 – 2016-03-11 (×2): 54 mg via ORAL
  Filled 2016-03-09 (×2): qty 1

## 2016-03-09 MED ORDER — LISINOPRIL 10 MG PO TABS
10.0000 mg | ORAL_TABLET | Freq: Every day | ORAL | Status: DC
Start: 2016-03-10 — End: 2016-03-11
  Administered 2016-03-10 – 2016-03-11 (×2): 10 mg via ORAL
  Filled 2016-03-09 (×2): qty 1

## 2016-03-09 MED ORDER — ENOXAPARIN SODIUM 40 MG/0.4ML ~~LOC~~ SOLN
40.0000 mg | SUBCUTANEOUS | Status: DC
  Administered 2016-03-10 (×2): 40 mg via SUBCUTANEOUS
  Filled 2016-03-09 (×2): qty 0.4

## 2016-03-09 MED ORDER — SODIUM CHLORIDE 0.9 % IV BOLUS (SEPSIS)
250.0000 mL | Freq: Once | INTRAVENOUS | Status: AC
  Administered 2016-03-09: 250 mL via INTRAVENOUS

## 2016-03-09 MED ORDER — SODIUM CHLORIDE 0.9% FLUSH
3.0000 mL | Freq: Two times a day (BID) | INTRAVENOUS | Status: DC
  Administered 2016-03-10 – 2016-03-11 (×3): 3 mL via INTRAVENOUS

## 2016-03-09 MED ORDER — ACETAMINOPHEN 650 MG RE SUPP: 650.0000 mg | Freq: Four times a day (QID) | RECTAL | Status: DC | PRN

## 2016-03-09 MED ORDER — ACETAMINOPHEN 500 MG PO TABS: 1000.0000 mg | ORAL_TABLET | Freq: Four times a day (QID) | ORAL | Status: DC | PRN

## 2016-03-09 MED ORDER — MAGNESIUM OXIDE 400 (241.3 MG) MG PO TABS
400.0000 mg | ORAL_TABLET | Freq: Two times a day (BID) | ORAL | Status: DC
  Administered 2016-03-10 – 2016-03-11 (×4): 400 mg via ORAL
  Filled 2016-03-09 (×4): qty 1

## 2016-03-09 MED ORDER — ALPRAZOLAM 0.5 MG PO TABS
1.0000 mg | ORAL_TABLET | Freq: Three times a day (TID) | ORAL | Status: DC
  Administered 2016-03-10 – 2016-03-11 (×6): 1 mg via ORAL
  Filled 2016-03-09: qty 2
  Filled 2016-03-09: qty 1
  Filled 2016-03-09: qty 2
  Filled 2016-03-09: qty 1
  Filled 2016-03-09 (×2): qty 2

## 2016-03-09 MED ORDER — LORATADINE 10 MG PO TABS
10.0000 mg | ORAL_TABLET | Freq: Every day | ORAL | Status: DC
  Administered 2016-03-10 – 2016-03-11 (×2): 10 mg via ORAL
  Filled 2016-03-09 (×2): qty 1

## 2016-03-09 MED ORDER — ONDANSETRON HCL 4 MG/2ML IJ SOLN: 4.0000 mg | Freq: Four times a day (QID) | INTRAMUSCULAR | Status: DC | PRN

## 2016-03-09 MED ORDER — POTASSIUM CHLORIDE IN NACL 20-0.45 MEQ/L-% IV SOLN
INTRAVENOUS | Status: DC
  Administered 2016-03-10: via INTRAVENOUS
  Filled 2016-03-09 (×3): qty 1000

## 2016-03-09 MED ORDER — POTASSIUM CHLORIDE CRYS ER 10 MEQ PO TBCR
10.0000 meq | EXTENDED_RELEASE_TABLET | Freq: Two times a day (BID) | ORAL | Status: DC
  Administered 2016-03-10: 10 meq via ORAL
  Filled 2016-03-09: qty 1

## 2016-03-09 MED ORDER — TRAZODONE HCL 50 MG PO TABS
50.0000 mg | ORAL_TABLET | Freq: Every evening | ORAL | Status: DC | PRN
  Administered 2016-03-10 (×2): 50 mg via ORAL
  Filled 2016-03-09 (×2): qty 1

## 2016-03-09 MED ORDER — ONDANSETRON HCL 4 MG PO TABS: 4.0000 mg | ORAL_TABLET | Freq: Four times a day (QID) | ORAL | Status: DC | PRN

## 2016-03-09 MED ORDER — PNEUMOCOCCAL VAC POLYVALENT 25 MCG/0.5ML IJ INJ
0.5000 mL | INJECTION | INTRAMUSCULAR | Status: AC
  Administered 2016-03-11: 0.5 mL via INTRAMUSCULAR
  Filled 2016-03-09: qty 0.5

## 2016-03-09 MED ORDER — PIPERACILLIN-TAZOBACTAM 3.375 G IVPB 30 MIN
3.3750 g | Freq: Once | INTRAVENOUS | Status: AC
  Administered 2016-03-09: 3.375 g via INTRAVENOUS
  Filled 2016-03-09: qty 50

## 2016-03-09 MED ORDER — MOMETASONE FURO-FORMOTEROL FUM 200-5 MCG/ACT IN AERO
2.0000 | INHALATION_SPRAY | Freq: Two times a day (BID) | RESPIRATORY_TRACT | Status: DC
  Administered 2016-03-10 – 2016-03-11 (×3): 2 via RESPIRATORY_TRACT
  Filled 2016-03-09: qty 8.8

## 2016-03-09 MED ORDER — OXYCODONE HCL 20 MG PO TABS
1.0000 | ORAL_TABLET | Freq: Three times a day (TID) | ORAL | Status: DC
  Administered 2016-03-10: 20 mg via ORAL

## 2016-03-09 MED ORDER — ASPIRIN EC 81 MG PO TBEC
81.0000 mg | DELAYED_RELEASE_TABLET | Freq: Every day | ORAL | Status: DC
  Administered 2016-03-10 – 2016-03-11 (×2): 81 mg via ORAL
  Filled 2016-03-09 (×2): qty 1

## 2016-03-09 MED ORDER — ACETAMINOPHEN 325 MG PO TABS
650.0000 mg | ORAL_TABLET | Freq: Four times a day (QID) | ORAL | Status: DC | PRN
  Administered 2016-03-10: 650 mg via ORAL
  Filled 2016-03-09: qty 2

## 2016-03-09 MED ORDER — PANTOPRAZOLE SODIUM 40 MG PO TBEC
40.0000 mg | DELAYED_RELEASE_TABLET | Freq: Two times a day (BID) | ORAL | Status: DC
  Administered 2016-03-10 (×2): 40 mg via ORAL
  Filled 2016-03-09 (×3): qty 1

## 2016-03-09 MED ORDER — VANCOMYCIN HCL IN DEXTROSE 750-5 MG/150ML-% IV SOLN
750.0000 mg | INTRAVENOUS | Status: DC
  Administered 2016-03-10: 750 mg via INTRAVENOUS
  Filled 2016-03-09 (×2): qty 150

## 2016-03-09 NOTE — ED Notes (Signed)
Pt soiled self and bed x 2. Pt cleaned up, given new sheets. Pt tolerated well, NAD noted at this time. Vitals stable.

## 2016-03-09 NOTE — H&P (Signed)
PCP:   Leonel Ramsay, MD   Chief Complaint:  Shakes  HPI: This is a 55 year old female who has been having the shakes on the for the past few days. That got worse today. She denies any fever, nausea or vomiting. She denies any chills. She has been having problems urinating on and off for a while. She was in the ER last night and was diagnosed with urinary retention. A Foley was placed, she was discharged home with instructions to follow-up with a urologist outpatient. Today she started leaking around the catheter and she was returned to the ER for this reason. In the ER a UA revealed a significant UTI. The patient was also hypotensive with systolic blood pressure in the 70s. She received 2 and a half liters of normal saline bolus and the blood pressures rebounded. The hospitalist was requested to admit. On arriving in the room the patient's blood pressure had again decreased to 77. The patient was shivering. She and her significant other were able to give a history. She does have some abdominal discomfort, mostly in the lower quadrants radiating around to the back like a band. She has chronic back pain with multiple back surgeries but she is experiencing some increased discomfort. She denies any other symptoms such as lightheadedness or dizziness. Patient's Foley was change in the ER.  Review of Systems:  The patient denies anorexia, fever, weight loss,, vision loss, decreased hearing, hoarseness, chest pain, syncope, dyspnea on exertion, peripheral edema, balance deficits, hemoptysis, abdominal pain, melena, hematochezia, severe indigestion/heartburn, hematuria, incontinence, genital sores, muscle weakness, suspicious skin lesions, transient blindness, difficulty walking, depression, unusual weight change, abnormal bleeding, enlarged lymph nodes, angioedema, and breast masses.  Past Medical History: Past Medical History  Diagnosis Date  . Stroke ([place])   . Coronary artery disease   . COPD  (chronic obstructive pulmonary disease) ([place])   . Myocardial infarction ([place])   . Anginal pain ([place])   . Hypertension   . Cancer [facility])     melanoma skin cancer   Past Surgical History  Procedure Laterality Date  . Coronary artery bypass graft    . Peripheral vascular catheterization N/A 02/10/2015    Procedure: Carotid PTA/Stent Intervention;  Surgeon: Katha Cabal, MD;  Location: [place] [facility];  Service: Cardiovascular;  Laterality: N/A;  . Stent placement vascular ([facility] hx)    . Peripheral vascular catheterization N/A 12/17/2015    Procedure: Visceral Angiography;  Surgeon: Katha Cabal, MD;  Location: [place] [facility];  Service: Cardiovascular;  Laterality: N/A;  . Peripheral vascular catheterization N/A 12/17/2015    Procedure: Visceral Artery Intervention;  Surgeon: Katha Cabal, MD;  Location: [place] [facility];  Service: Cardiovascular;  Laterality: N/A;  . Abdominal hysterectomy    . Other surgical history  Jul 11 2014    sternum removal  . Nose surgery      cancer removal  . Esophagogastroduodenoscopy (egd) with propofol N/A 01/24/2016    Procedure: ESOPHAGOGASTRODUODENOSCOPY (EGD) WITH PROPOFOL;  Surgeon: Lucilla Lame, MD;  Location: [facility] ENDOS[facility];  Service: Endoscopy;  Laterality: N/A;  . Colonoscopy with propofol N/A 01/24/2016    Procedure: COLONOSCOPY WITH PROPOFOL;  Surgeon: Lucilla Lame, MD;  Location: [facility] ENDOSCOPY;  Service: Endoscopy;  Laterality: N/A;  . Kyphoplasty N/A 02/08/2016    Procedure: KYPHOPLASTY L1;  Surgeon: Hessie Knows, MD;  Location: [facility] ORS;  Service: Orthopedics;  Laterality: N/A;  . Breast biopsy Left 6 2017    results not in yet  Medications: Prior to Admission medications   Medication Sig Start Date End Date Taking? Authorizing Provider  acetaminophen (TYLENOL) 500 MG tablet Take 1,000 mg by mouth every 6 (six) hours as needed for mild pain or headache.   Yes Historical Provider, MD  ALPRAZolam Duanne Moron) 1 MG tablet  Take 1 mg by mouth 3 (three) times daily.   Yes Historical Provider, MD  aspirin 81 MG EC tablet Take 81 mg by mouth daily.    Yes Historical Provider, MD  atorvastatin (LIPITOR) 80 MG tablet Take 80 mg by mouth daily.   Yes Historical Provider, MD  carvedilol (COREG) 12.5 MG tablet Take 12.5 mg by mouth 2 (two) times daily with a meal.   Yes Historical Provider, MD  cetirizine (ZYRTEC) 10 MG tablet Take 10 mg by mouth daily. Reported on 02/08/2016   Yes Historical Provider, MD  clopidogrel (PLAVIX) 75 MG tablet Take 75 mg by mouth daily.  02/02/16  Yes Historical Provider, MD  fenofibrate (TRICOR) 145 MG tablet Take 145 mg by mouth daily.    Yes Historical Provider, MD  Fluticasone-Salmeterol (ADVAIR) 250-50 MCG/DOSE AEPB Inhale 1 puff into the lungs 2 (two) times daily.   Yes Historical Provider, MD  lisinopril (ZESTRIL) 10 MG tablet Take 1 tablet (10 mg total) by mouth daily. 01/25/16  Yes Henreitta Leber, MD  Magnesium 250 MG TABS Take 250 mg by mouth 2 (two) times daily.   Yes Historical Provider, MD  Oxycodone HCl 20 MG TABS Take 1 tablet by mouth every 8 (eight) hours. 12/28/15  Yes Historical Provider, MD  pantoprazole (PROTONIX) 40 MG tablet Take 1 tablet (40 mg total) by mouth 2 (two) times daily. 01/25/16  Yes Henreitta Leber, MD  potassium chloride (K-DUR,KLOR-CON) 10 MEQ tablet Take 10 mEq by mouth 2 (two) times daily.   Yes Historical Provider, MD  tamsulosin (FLOMAX) 0.4 MG CAPS capsule Take 1 capsule (0.4 mg total) by mouth daily. 03/08/16  Yes Schuyler Amor, MD  traZODone (DESYREL) 50 MG tablet Take 50 mg by mouth at bedtime as needed for sleep.    Yes Historical Provider, MD    Allergies:   Allergies  Allergen Reactions  . Ferrous Sulfate Hives    Social History:  reports that she quit smoking about 3 years ago. She has never used smokeless tobacco. She reports that she does not drink alcohol or use illicit drugs.  Family History: Family History  Problem Relation Age of Onset   . Breast cancer Sister   . CAD Father   . CAD Brother     Physical Exam: Filed Vitals:   03/09/16 1735 03/09/16 1843 03/09/16 1952 03/09/16 2046  BP: 101/48 95/50 114/54 129/69  Pulse: 72 77 73 77  Temp:      TempSrc:      Resp: 21 18 18 18   Height:      Weight:      SpO2: 99% 100% 100% 98%    General:  Alert and oriented times three, well developed and nourished, Shivering patient Eyes: PERRLA, pink conjunctiva, no scleral icterus ENT: Moist oral mucosa, neck supple, no thyromegaly Lungs: clear to ascultation, no wheeze, no crackles, no use of accessory muscles Cardiovascular: regular rate and rhythm, no regurgitation, no gallops, no murmurs. No carotid bruits, no JVD Abdomen: soft, positive BS, mild nonspecific tenderness to palpation, non-distended, no organomegaly, not an acute abdomen GU: Indwelling Foley catheter Neuro: CN II - XII grossly intact, sensation intact Musculoskeletal: strength 5/5 all extremities,  no clubbing, cyanosis or edema Skin: no rash, no subcutaneous crepitation, no decubitus Psych: appropriate patient   Labs on Admission:   Recent Labs  03/07/16 03/08/16 2000 03/09/16 1645  NA 128* 134* 134*  K 4.1 4.0 4.2  CL 93* 96* 98*  CO2 24 26 26   GLUCOSE 105* 104* 140*  BUN 15 18 32*  CREATININE 0.97 0.82 1.18*  CALCIUM 9.3 10.0 9.5  MG 1.7  --   --     Recent Labs  03/07/16 03/08/16 2000  AST 22 22  ALT 19 19  ALKPHOS 112 116  BILITOT 0.8 1.3*  PROT 6.9 7.5  ALBUMIN 4.2 4.6   No results for input(s): LIPASE, AMYLASE in the last 72 hours.  Recent Labs  03/08/16 2000 03/09/16 1645  WBC 6.5 9.5  NEUTROABS 4.0 6.5  HGB 16.0 14.8  HCT 46.6 45.1  MCV 94.6 97.7  PLT 284 282    Recent Labs  03/07/16 03/07/16 0408 03/09/16 1613  TROPONINI <0.03 <0.03 <0.03   Invalid input(s): POCBNP No results for input(s): DDIMER in the last 72 hours. No results for input(s): HGBA1C in the last 72 hours. No results for input(s): CHOL, HDL,  LDLCALC, TRIG, CHOLHDL, LDLDIRECT in the last 72 hours. No results for input(s): TSH, T4TOTAL, T3FREE, THYROIDAB in the last 72 hours.  Invalid input(s): FREET3 No results for input(s): VITAMINB12, FOLATE, FERRITIN, TIBC, IRON, RETICCTPCT in the last 72 hours.  Micro Results: No results found for this or any previous visit (from the past 240 hour(s)).   Radiological Exams on Admission: Dg Chest Port 1 View  03/09/2016  CLINICAL DATA:  Sepsis, low blood pressure EXAM: PORTABLE CHEST 1 VIEW COMPARISON:  03/07/2016 FINDINGS: There is no focal parenchymal opacity. There is no pleural effusion or pneumothorax. The heart and mediastinal contours are unremarkable. There is evidence of prior CABG. The osseous structures are unremarkable. IMPRESSION: No active disease. Electronically Signed   By: Kathreen Devoid   On: 03/09/2016 17:39   Mm Digital Screening Bilateral  03/08/2016  CLINICAL DATA:  Screening. EXAM: DIGITAL SCREENING BILATERAL MAMMOGRAM WITH CAD COMPARISON:  Previous exam(s). ACR Breast Density Category b: There are scattered areas of fibroglandular density. FINDINGS: There are no findings suspicious for malignancy. Images were processed with CAD. IMPRESSION: No mammographic evidence of malignancy. A result letter of this screening mammogram will be mailed directly to the patient. RECOMMENDATION: Screening mammogram in one year. (Code:SM-B-01Y) BI-RADS CATEGORY  1: Negative. Electronically Signed   By: Fidela Salisbury M.D.   On: 03/08/2016 12:03   Ct Angio Chest/abd/pel For Dissection W And/or W/wo  03/09/2016  CLINICAL DATA:  Lower abdominal discomfort, dizziness EXAM: CT ANGIOGRAPHY CHEST, ABDOMEN AND PELVIS TECHNIQUE: Multidetector CT imaging through the chest, abdomen and pelvis was performed using the standard protocol during bolus administration of intravenous contrast. Multiplanar reconstructed images and MIPs were obtained and reviewed to evaluate the vascular anatomy. CONTRAST:  100 mL  Isovue 370 IV COMPARISON:  CTA chest abdomen pelvis dated 01/20/2016. FINDINGS: CT CHEST FINDINGS On unenhanced CT, there is no evidence of mediastinal hematoma. No evidence of thoracic aortic aneurysm or dissection. Although not tailored for evaluation, there is no evidence of pulmonary embolism. Mediastinum/Nodes: Mild cardiomegaly.  No pericardial effusion. Three vessel coronary atherosclerosis. Postsurgical changes related to prior CABG. Atherosclerotic calcifications of the aortic arch. 13 mm short axis subcarinal node. Visualized thyroid is grossly unremarkable. Lungs/Pleura: Evaluation of lung parenchyma is constrained by respiratory motion. Mild emphysematous changes. No suspicious pulmonary  nodules. No focal consolidation. No pleural effusion or pneumothorax. Musculoskeletal: Very mild degenerative changes of the mid thoracic spine. No rib fracture is seen. CT ABDOMEN PELVIS FINDINGS No evidence of abdominal aortic aneurysm or dissection. 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 SMA stent. Bilateral iliac artery stents. Right common femoral artery stent. 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 SMA is patent. Atherosclerosis at the origins of the bilateral renal arteries, which remain patent. Bilateral iliac arterial systems are patent. Hepatobiliary: Liver is within normal limits. Gallbladder is underdistended but unremarkable. No intrahepatic or extrahepatic ductal dilatation. Pancreas: Within normal limits. Spleen: Mildly heterogeneous perfusion. Adrenals/Urinary Tract: Adrenal glands are within normal limits. Kidneys are within normal limits.  No hydronephrosis. Bladder is decompressed with indwelling Foley catheter. Stomach/Bowel: Stomach is notable for a tiny hiatal hernia. No evidence of bowel obstruction. Sigmoid diverticulosis, without evidence of diverticulitis. Vascular/Lymphatic: Vascular findings as above. No  suspicious abdominopelvic lymphadenopathy Reproductive: Status post hysterectomy. No adnexal masses. Other: No abdominopelvic ascites. Musculoskeletal: Mild L1 compression fracture deformity with prior vertebral augmentation. Mild superior endplate compression fracture deformity at L4, new. No retropulsion. Review of the MIP images confirms the above findings. IMPRESSION: No evidence of thoracoabdominal aortic aneurysm or dissection. No evidence of pulmonary embolism. Three vessel coronary atherosclerosis.  Status post CABG. SMA, bilateral iliac, and right common femoral artery vascular stents, as above. Mild superior endplate compression fracture deformity at L4, new. Prior mild L1 compression fracture deformity with vertebral augmentation. Additional ancillary findings as above. Electronically Signed   By: Julian Hy M.D.   On: 03/09/2016 18:54    Assessment/Plan Present on Admission:  . Sepsis ([place]) likely secondary to UTI -Admit to stepdown -Continue aggressive fluid rehydration, 1 L normal saline bolus ordered as patient's last systolic blood pressure 77. Patient's blood pressure had rebounded but has now decreased again. -Blood cultures and urine cultures ordered -Continue IV antibiotics vancomycin and Zosyn pharmacy to manage -No presses ordered, will add if needed.  UTI -See above  Urinary retention with foley placement -Foley in place. Patient has appointment with urologist next week  . HLD (hyperlipidemia) -Stable, resume home medications  . Ischemic colitis ([place]) -Stable.  . Peripheral vascular disease ([place]) -Stable.  . Vitamin B12 deficiency anemia -Stable, resume home medications   Anaisa Radi 03/09/2016, 9:27 PM

## 2016-03-09 NOTE — ED Notes (Signed)
Pt's husband brought in food for pt. Pt eating and tolerating well, NAD noted at this time.

## 2016-03-09 NOTE — ED Notes (Signed)
MD at bedside. 

## 2016-03-09 NOTE — ED Provider Notes (Signed)
[facility] Emergency Department Provider Note   ____________________________________________  Time seen: Approximately [id number] PM  I have reviewed the triage vital signs and the nursing notes.   HISTORY  Chief Complaint foley catheter problem  and Hypotension   HPI Kirsten Castro is a 55 y.o. female with a history of chronic back pain and urinary retention and was presenting to the emergency department today with urinary retention as well as hypotension. She says that she was seen in the emergency department yesterday with urinary retention and a Foley catheter was placed. She is now complaining of blood in the Foley as well as no drainage over the past several hours. She says that she is beginning to feel fullness in her lower abdomen again as if she was retaining urine.    Past Medical History  Diagnosis Date  . Stroke ([place])   . Coronary artery disease   . COPD (chronic obstructive pulmonary disease) ([place])   . Myocardial infarction ([place])   . Anginal pain ([place])   . Hypertension   . Cancer [facility])     melanoma skin cancer    Patient Active Problem List   Diagnosis Date Noted  . Temporary cerebral vascular dysfunction 02/03/2016  . Peripheral vascular disease ([place]) 02/03/2016  . BP (high blood pressure) 02/03/2016  . HLD (hyperlipidemia) 02/03/2016  . Clinical depression 02/03/2016  . Carotid artery narrowing 02/03/2016  . 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 Anxiety 02/03/2016  . Abdominal pain, generalized   . Nausea with vomiting   . Gastritis   . Esophageal candidiasis ([place])   . Noninfectious diarrhea   . Intractable pain 01/21/2016  . Upper abdominal pain   . Nausea and vomiting in adult   . Ischemic colitis ([place]) 01/01/2016  . Colitis 12/15/2015  . GI bleed 12/15/2015  . Acute inflammation of the pancreas 12/06/2015  . Vitamin B12 deficiency anemia  02/25/2015  . Absolute anemia 02/25/2015  . Carotid stenosis 02/10/2015  . Personal history of other specified conditions 11/27/2014  . Osteomyelitis ([place]) 11/27/2014  . H/O coronary artery bypass surgery 05/18/2014    Past Surgical History  Procedure Laterality Date  . Coronary artery bypass graft    . Peripheral vascular catheterization N/A 02/10/2015    Procedure: Carotid PTA/Stent Intervention;  Surgeon: Katha Cabal, MD;  Location: [place] CV LAB;  Service: Cardiovascular;  Laterality: N/A;  . Stent placement vascular ([facility] hx)    . Peripheral vascular catheterization N/A 12/17/2015    Procedure: Visceral Angiography;  Surgeon: Katha Cabal, MD;  Location: [place] CV LAB;  Service: Cardiovascular;  Laterality: N/A;  . Peripheral vascular catheterization N/A 12/17/2015    Procedure: Visceral Artery Intervention;  Surgeon: Katha Cabal, MD;  Location: [place] CV LAB;  Service: Cardiovascular;  Laterality: N/A;  . Abdominal hysterectomy    . Other surgical history  Jul 11 2014    sternum removal  . Nose surgery      cancer removal  . Esophagogastroduodenoscopy (egd) with propofol N/A 01/24/2016    Procedure: ESOPHAGOGASTRODUODENOSCOPY (EGD) WITH PROPOFOL;  Surgeon: Lucilla Lame, MD;  Location: [facility] ENDOSCOPY;  Service: Endoscopy;  Laterality: N/A;  . Colonoscopy with propofol N/A 01/24/2016    Procedure: COLONOSCOPY WITH PROPOFOL;  Surgeon: Lucilla Lame, MD;  Location: [facility] ENDOSCOPY;  Service: Endoscopy;  Laterality: N/A;  . Kyphoplasty N/A 02/08/2016    Procedure: KYPHOPLASTY L1;  Surgeon: Hessie Knows,  MD;  Location: [facility] ORS;  Service: Orthopedics;  Laterality: N/A;  . Breast biopsy Left 6 2017    results not in yet    Current Outpatient Rx  Name  Route  Sig  Dispense  Refill  . acetaminophen (TYLENOL) 500 MG tablet   Oral   Take 1,000 mg by mouth every 6 (six) hours as needed for mild pain or headache.         . ALPRAZolam (XANAX) 1 MG tablet    Oral   Take 1 mg by mouth 3 (three) times daily.         Marland Kitchen aspirin 81 MG EC tablet   Oral   Take 81 mg by mouth daily.          Marland Kitchen atorvastatin (LIPITOR) 80 MG tablet   Oral   Take 80 mg by mouth daily.         . carvedilol (COREG) 12.5 MG tablet   Oral   Take 12.5 mg by mouth 2 (two) times daily with a meal.         . cetirizine (ZYRTEC) 10 MG tablet   Oral   Take 10 mg by mouth daily. Reported on 02/08/2016         . clopidogrel (PLAVIX) 75 MG tablet               . clotrimazole (LOTRIMIN) 1 % cream      APP EXT AA BID      0   . fenofibrate (TRICOR) 145 MG tablet   Oral   Take by mouth.         . Fluticasone-Salmeterol (ADVAIR) 250-50 MCG/DOSE AEPB   Inhalation   Inhale 1 puff into the lungs 2 (two) times daily.         . furosemide (LASIX) 40 MG tablet   Oral   Take by mouth.         Marland Kitchen lisinopril (ZESTRIL) 10 MG tablet   Oral   Take 1 tablet (10 mg total) by mouth daily.   60 tablet   1   . Magnesium 250 MG TABS   Oral   Take 250 mg by mouth 2 (two) times daily.         Marland Kitchen nystatin (MYCOSTATIN) 100000 UNIT/ML suspension   Oral   Take 5 mLs by mouth daily.      0   . oxyCODONE (OXY IR/ROXICODONE) 5 MG immediate release tablet   Oral   Take 1 tablet by mouth every 12 (twelve) hours.         . Oxycodone HCl 20 MG TABS   Oral   Take 1 tablet by mouth every 8 (eight) hours.         Marland Kitchen oxyCODONE-acetaminophen (PERCOCET/ROXICET) 5-325 MG tablet   Oral   Take 1 tablet by mouth 1 day or 1 dose.         . pantoprazole (PROTONIX) 40 MG tablet   Oral   Take 1 tablet (40 mg total) by mouth 2 (two) times daily.   60 tablet   1   . potassium chloride (K-DUR,KLOR-CON) 10 MEQ tablet   Oral   Take 10 mEq by mouth 2 (two) times daily.         . tamsulosin (FLOMAX) 0.4 MG CAPS capsule   Oral   Take 1 capsule (0.4 mg total) by mouth daily.   10 capsule   0   . traZODone (DESYREL) 50 MG tablet  Oral   Take 50 mg by mouth at  bedtime as needed for sleep.            Allergies Ferrous sulfate  Family History  Problem Relation Age of Onset  . Breast cancer Sister   . CAD Father   . CAD Brother     Social History Social History  Substance Use Topics  . Smoking status: Former Smoker    Quit date: 08/01/2012  . Smokeless tobacco: Never Used  . Alcohol Use: No    Review of Systems Constitutional: No fever/chills Eyes: No visual changes. ENT: No sore throat. Cardiovascular: Denies chest pain. Respiratory: Denies shortness of breath. Gastrointestinal: No nausea, no vomiting.  No diarrhea.  No constipation. Genitourinary: Negative for dysuria. Musculoskeletal: Negative for back pain. Skin: Negative for rash. Neurological:Weakness to the bilateral lower extremities which the patient says is chronic and unchanged. 10-point ROS otherwise negative.  ____________________________________________   PHYSICAL EXAM:  VITAL SIGNS: ED Triage Vitals  Enc Vitals Group     BP 03/09/16 1538 0/0 mmHg     Pulse Rate 03/09/16 1538 82     Resp 03/09/16 1538 18     Temp 03/09/16 1538 97.9 F (36.6 C)     Temp Source 03/09/16 1538 Oral     SpO2 03/09/16 1538 96 %     Weight 03/09/16 1538 155 lb (70.308 kg)     Height 03/09/16 1538 5\' 1"  (1.549 m)     Head Cir --      Peak Flow --      Pain Score 03/09/16 1544 8     Pain Loc --      Pain Edu? --      Excl. in [place]? --     Constitutional: Alert and oriented. Well appearing and in no acute distress. Eyes: Conjunctivae are normal. PERRL. EOMI. Head: Atraumatic. Nose: No congestion/rhinnorhea. Mouth/Throat: Mucous membranes are moist.  Oropharynx non-erythematous. Neck: No stridor.   Cardiovascular: Normal rate, regular rhythm. Grossly normal heart sounds.  Good peripheral circulation With bilateral and equal radial as well as dorsalis pedis pulses.  Respiratory: Normal respiratory effort.  No retractions. Lungs CTAB. Gastrointestinal: Soft with minimal  lower abdominal tenderness over the suprapubic region. No guarding or rebound. No distention. No CVA tenderness. Genitourinary:  Patient with Foley bag to the right thigh. Yellow urine mixed with blood in the bag. Only a small amount of bag contents, likely 5 10 cc. Musculoskeletal: No lower extremity tenderness nor edema.  No joint effusions. Neurologic:  Normal speech and language. 4 out of 5 strength to bilateral lower extremities which the patient says is chronic and unchanged however is not found to be documented on yesterday's note. No saddle anesthesia. Skin:  Skin is warm, dry and intact. No rash noted. Psychiatric: Mood and affect are normal. Speech and behavior are normal.  ____________________________________________   LABS (all labs ordered are listed, but only abnormal results are displayed)  Labs Reviewed  CBC WITH DIFFERENTIAL/PLATELET - Abnormal; Notable for the following:    Monocytes Absolute 1.0 (*)    All other components within normal limits  BASIC METABOLIC PANEL - Abnormal; Notable for the following:    Sodium 134 (*)    Chloride 98 (*)    Glucose, Bld 140 (*)    BUN 32 (*)    Creatinine, Ser 1.18 (*)    GFR calc non Af Amer 52 (*)    All other components within normal limits  URINALYSIS COMPLETEWITH MICROSCOPIC (  [facility] ONLY) - Abnormal; Notable for the following:    Color, Urine AMBER (*)    APPearance CLOUDY (*)    Hgb urine dipstick 3+ (*)    Protein, ur 100 (*)    Nitrite POSITIVE (*)    Leukocytes, UA 2+ (*)    Squamous Epithelial / LPF 6-30 (*)    All other components within normal limits  CULTURE, BLOOD (ROUTINE X 2)  CULTURE, BLOOD (ROUTINE X 2)  URINE CULTURE  LACTIC ACID, PLASMA  TROPONIN I   ____________________________________________  EKG  ED ECG REPORT I, Doran Stabler, the attending physician, personally viewed and interpreted this ECG.   Date: 03/09/2016  EKG Time: 1600  Rate: 86  Rhythm: normal sinus rhythm  Axis: Normal  axis  Intervals:none  ST&T Change: No ST segment elevation or depression. Sigel T-wave inversion in aVL.  ____________________________________________  RADIOLOGY     CT Angio Chest/Abd/Pel for Dissection W and/or W/WO (Final result) Result time: 03/09/16 18:54:23   Final result by Rad Results In Interface (03/09/16 18:54:23)   Narrative:   CLINICAL DATA: Lower abdominal discomfort, dizziness  EXAM: CT ANGIOGRAPHY CHEST, ABDOMEN AND PELVIS  TECHNIQUE: Multidetector CT imaging through the chest, abdomen and pelvis was performed using the standard protocol during bolus administration of intravenous contrast. Multiplanar reconstructed images and MIPs were obtained and reviewed to evaluate the vascular anatomy.  CONTRAST: 100 mL Isovue 370 IV  COMPARISON: CTA chest abdomen pelvis dated 01/20/2016.  FINDINGS: CT CHEST FINDINGS  On unenhanced CT, there is no evidence of mediastinal hematoma.  No evidence of thoracic aortic aneurysm or dissection.  Although not tailored for evaluation, there is no evidence of pulmonary embolism.  Mediastinum/Nodes: Mild cardiomegaly. No pericardial effusion.  Three vessel coronary atherosclerosis. Postsurgical changes related to prior CABG.  Atherosclerotic calcifications of the aortic arch.  13 mm short axis subcarinal node.  Visualized thyroid is grossly unremarkable.  Lungs/Pleura: Evaluation of lung parenchyma is constrained by respiratory motion.  Mild emphysematous changes.  No suspicious pulmonary nodules.  No focal consolidation.  No pleural effusion or pneumothorax.  Musculoskeletal: Very mild degenerative changes of the mid thoracic spine.  No rib fracture is seen.  CT ABDOMEN PELVIS FINDINGS  No evidence of abdominal aortic aneurysm or dissection.  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  SMA stent. Bilateral iliac artery stents. Right common femoral artery  stent.  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 SMA is patent. Atherosclerosis at the origins of the bilateral renal arteries, which remain patent. Bilateral iliac arterial systems are patent.  Hepatobiliary: Liver is within normal limits.  Gallbladder is underdistended but unremarkable. No intrahepatic or extrahepatic ductal dilatation.  Pancreas: Within normal limits.  Spleen: Mildly heterogeneous perfusion.  Adrenals/Urinary Tract: Adrenal glands are within normal limits.  Kidneys are within normal limits. No hydronephrosis.  Bladder is decompressed with indwelling Foley catheter.  Stomach/Bowel: Stomach is notable for a tiny hiatal hernia.  No evidence of bowel obstruction.  Sigmoid diverticulosis, without evidence of diverticulitis.  Vascular/Lymphatic: Vascular findings as above.  No suspicious abdominopelvic lymphadenopathy  Reproductive: Status post hysterectomy.  No adnexal masses.  Other: No abdominopelvic ascites.  Musculoskeletal: Mild L1 compression fracture deformity with prior vertebral augmentation.  Mild superior endplate compression fracture deformity at L4, new. No retropulsion.  Review of the MIP images confirms the above findings.  IMPRESSION: No evidence of thoracoabdominal aortic aneurysm or dissection.  No evidence  of pulmonary embolism.  Three vessel coronary atherosclerosis. Status post CABG.  SMA, bilateral iliac, and right common femoral artery vascular stents, as above.  Mild superior endplate compression fracture deformity at L4, new. Prior mild L1 compression fracture deformity with vertebral augmentation.  Additional ancillary findings as above.   Electronically Signed By: Julian Hy M.D. On: 03/09/2016 18:54          DG Chest Port 1 View (Final result) Result time: 03/09/16 17:39:04   Final result by Rad Results In Interface (03/09/16 17:39:04)    Narrative:   CLINICAL DATA: Sepsis, low blood pressure  EXAM: PORTABLE CHEST 1 VIEW  COMPARISON: 03/07/2016  FINDINGS: There is no focal parenchymal opacity. There is no pleural effusion or pneumothorax. The heart and mediastinal contours are unremarkable. There is evidence of prior CABG.  The osseous structures are unremarkable.  IMPRESSION: No active disease.   Electronically Signed By: Kathreen Devoid On: 03/09/2016 17:39    ____________________________________________   PROCEDURES    ____________________________________________   INITIAL IMPRESSION / ASSESSMENT AND PLAN / ED COURSE  Pertinent labs & imaging results that were available during my care of the patient were reviewed by me and considered in my medical decision making (see chart for details).  ----------------------------------------- 5:28 PM on 03/09/2016 -----------------------------------------  Foley catheter flushed with only minimal return. Bladder scan done with a reading of only 27 mL. Patient saying that she still feels urine dripping from around the catheter onto her leg. Foley changed and still without any large amount of urine drainage. We'll pursue further workup with CAT scan imaging. Patient also shaking at this time.  Osmole Reiger's. Recheck temperature the room and the patient's aperture was 97.6.  ----------------------------------------- 7:56 PM on 03/09/2016 -----------------------------------------  Patient with blood pressure that appears to be resolving however last systolic was [id number]. Labs very reassuring. However, patient has only put out 200 cc of urine. Patient initially with severe hypotension to the 0000000 systolic. Feel that she is likely in the early stages of sepsis and do not feel comfortable discharging her to home at this time. I discussed this with the patient and she agrees. She will be admitted to the hospital. Signed out to Dr.  Juliet Rude ____________________________________________   FINAL CLINICAL IMPRESSION(S) / ED DIAGNOSES  Final diagnoses:  Lower abdominal pain  Hypotension.Sepsis. UTI.    NEW MEDICATIONS STARTED DURING THIS VISIT:  New Prescriptions   No medications on file     Note:  This document was prepared using Dragon voice recognition software and may include unintentional dictation errors.    Orbie Pyo, MD 03/09/16 [phone]

## 2016-03-09 NOTE — Progress Notes (Signed)
Pharmacy Antibiotic Note  Kirsten Castro is a 55 y.o. female admitted on 03/09/2016 with sepsis.  Pharmacy has been consulted for Zosyn and vancomycin dosing.  Plan: 1. Zosyn 3.375 gm IV Q8H EI 2. Vancomycin 1 gm IV x 1 in ED followed in 12 hours (stacked dosing) by 750 mg IV Q18H, predicted trough 15 mcg/mL. Pharmacy will continue to follow and adjust as needed to maintain trough 15 to 20 mcg/mL.   Vd. 39.3 L, Ke 0.045 hr-1, T1/2 15.3 hr  Height: 5\' 1"  (154.9 cm) Weight: 153 lb (69.4 kg) IBW/kg (Calculated) : 47.8  Temp (24hrs), Avg:97.9 F (36.6 C), Min:97.8 F (36.6 C), Max:97.9 F (36.6 C)   Recent Labs Lab 03/07/16 03/08/16 2000 03/09/16 1645 03/09/16 1729  WBC 7.2 6.5 9.5  --   CREATININE 0.97 0.82 1.18*  --   LATICACIDVEN  --   --   --  1.5    Estimated Creatinine Clearance: 49.7 mL/min (by C-G formula based on Cr of 1.18).    Allergies  Allergen Reactions  . Ferrous Sulfate Hives    Thank you for allowing pharmacy to be a part of this patient's care.  Laural Benes, Pharm.D., BCPS Clinical Pharmacist 03/09/2016 11:58 PM

## 2016-03-09 NOTE — ED Notes (Signed)
Seen and treated in ED yesterday for urinary retention, sent home with foley.  Patient states catheter has stopped draining and "has come out a little bit".  C/o lower abdominal discomfort and feeling dizzy.

## 2016-03-10 LAB — CBC
HEMATOCRIT: 40.2 % (ref 35.0–47.0)
Hemoglobin: 13.1 g/dL (ref 12.0–16.0)
MCH: 32.4 pg (ref 26.0–34.0)
MCHC: 32.5 g/dL (ref 32.0–36.0)
MCV: 99.7 fL (ref 80.0–100.0)
Platelets: 205 10*3/uL (ref 150–440)
RBC: 4.03 MIL/uL (ref 3.80–5.20)
RDW: 14.7 % — AB (ref 11.5–14.5)
WBC: 5.4 10*3/uL (ref 3.6–11.0)

## 2016-03-10 LAB — BASIC METABOLIC PANEL
Anion gap: 6 (ref 5–15)
BUN: 20 mg/dL (ref 6–20)
CHLORIDE: 107 mmol/L (ref 101–111)
CO2: 26 mmol/L (ref 22–32)
Calcium: 8.6 mg/dL — ABNORMAL LOW (ref 8.9–10.3)
Creatinine, Ser: 0.76 mg/dL (ref 0.44–1.00)
GFR calc Af Amer: >60 mL/min (ref >60)
GLUCOSE: 98 mg/dL (ref 65–99)
POTASSIUM: 4.5 mmol/L (ref 3.5–5.1)
Sodium: 139 mmol/L (ref 135–145)

## 2016-03-10 LAB — MRSA PCR SCREENING: MRSA by PCR: NEGATIVE

## 2016-03-10 LAB — URINE CULTURE: Culture: 60000 — AB

## 2016-03-10 MED ORDER — DOCUSATE SODIUM 100 MG PO CAPS
100.0000 mg | ORAL_CAPSULE | Freq: Two times a day (BID) | ORAL | Status: DC
  Administered 2016-03-10 – 2016-03-11 (×3): 100 mg via ORAL
  Filled 2016-03-10 (×3): qty 1

## 2016-03-10 MED ORDER — OXYCODONE HCL 5 MG PO TABS
20.0000 mg | ORAL_TABLET | Freq: Three times a day (TID) | ORAL | Status: AC
  Administered 2016-03-10: 20 mg via ORAL
  Filled 2016-03-10 (×3): qty 4

## 2016-03-10 MED ORDER — VANCOMYCIN HCL IN DEXTROSE 750-5 MG/150ML-% IV SOLN
750.0000 mg | Freq: Two times a day (BID) | INTRAVENOUS | Status: DC
  Administered 2016-03-10 – 2016-03-11 (×2): 750 mg via INTRAVENOUS
  Filled 2016-03-10 (×4): qty 150

## 2016-03-10 MED ORDER — POLYETHYLENE GLYCOL 3350 17 G PO PACK
17.0000 g | PACK | Freq: Two times a day (BID) | ORAL | Status: DC
  Administered 2016-03-10 – 2016-03-11 (×3): 17 g via ORAL
  Filled 2016-03-10 (×3): qty 1

## 2016-03-10 NOTE — Progress Notes (Signed)
[place] [place] NAME: Kirsten Castro    MR#:  [id number]  DATE OF BIRTH:  1961/09/22  SUBJECTIVE:  CHIEF COMPLAINT:  Patient is resting comfortably. Concerned about her bladder incontinence, sees urology as an outpatient in next appointment is on June 14  REVIEW OF SYSTEMS:  CONSTITUTIONAL: No fever, fatigue or weakness.  EYES: No blurred or double vision.  EARS, NOSE, AND THROAT: No tinnitus or ear pain.  RESPIRATORY: No cough, shortness of breath, wheezing or hemoptysis.  CARDIOVASCULAR: No chest pain, orthopnea, edema.  GASTROINTESTINAL: No nausea, vomiting, diarrhea or abdominal pain.  GENITOURINARY: No dysuria, hematuria. Has history of chronic bladder incontinence ENDOCRINE: No polyuria, nocturia,  HEMATOLOGY: No anemia, easy bruising or bleeding SKIN: No rash or lesion. MUSCULOSKELETAL: No joint pain or arthritis.   NEUROLOGIC: No tingling, numbness, weakness.  PSYCHIATRY: No anxiety or depression.   DRUG ALLERGIES:   Allergies  Allergen Reactions  . Ferrous Sulfate Hives    VITALS:  Blood pressure 151/71, pulse 73, temperature 97.9 F (36.6 C), temperature source Oral, resp. rate 18, height 5\' 1"  (1.549 m), weight 69.4 kg (153 lb), SpO2 99 %.  PHYSICAL EXAMINATION:  GENERAL:  55 y.o.-year-old patient lying in the bed with no acute distress.  EYES: Pupils equal, round, reactive to light and accommodation. No scleral icterus. Extraocular muscles intact.  HEENT: Head atraumatic, normocephalic. Oropharynx and nasopharynx clear.  NECK:  Supple, no jugular venous distention. No thyroid enlargement, no tenderness.  LUNGS: Normal breath sounds bilaterally, no wheezing, rales,rhonchi or crepitation. No use of accessory muscles of respiration.  CARDIOVASCULAR: S1, S2 normal. No murmurs, rubs, or gallops.  ABDOMEN: Soft, nontender, nondistended. Bowel sounds present. No organomegaly or mass.  EXTREMITIES: No pedal edema,  cyanosis, or clubbing.  NEUROLOGIC: Cranial nerves II through XII are intact. Muscle strength 5/5 in all extremities. Sensation intact. Gait not checked.  PSYCHIATRIC: The patient is alert and oriented x 3.  SKIN: No obvious rash, lesion, or ulcer.    LABORATORY PANEL:   CBC  Recent Labs Lab 03/10/16 0640  WBC 5.4  HGB 13.1  HCT 40.2  PLT 205   ------------------------------------------------------------------------------------------------------------------  Chemistries   Recent Labs Lab 03/07/16 03/08/16 2000  03/10/16 0640  NA 128* 134*  < > 139  K 4.1 4.0  < > 4.5  CL 93* 96*  < > 107  CO2 24 26  < > 26  GLUCOSE 105* 104*  < > 98  BUN 15 18  < > 20  CREATININE 0.97 0.82  < > 0.76  CALCIUM 9.3 10.0  < > 8.6*  MG 1.7  --   --   --   AST 22 22  --   --   ALT 19 19  --   --   ALKPHOS 112 116  --   --   BILITOT 0.8 1.3*  --   --   < > = values in this interval not displayed. ------------------------------------------------------------------------------------------------------------------  Cardiac Enzymes  Recent Labs Lab 03/09/16 1613  TROPONINI <0.03   ------------------------------------------------------------------------------------------------------------------  RADIOLOGY:  Dg Chest Port 1 View  03/09/2016  CLINICAL DATA:  Sepsis, low blood pressure EXAM: PORTABLE CHEST 1 VIEW COMPARISON:  03/07/2016 FINDINGS: There is no focal parenchymal opacity. There is no pleural effusion or pneumothorax. The heart and mediastinal contours are unremarkable. There is evidence of prior CABG. The osseous structures are unremarkable. IMPRESSION: No active disease. Electronically Signed   By: Kathreen Devoid  On: 03/09/2016 17:39   Ct Angio Chest/abd/pel For Dissection W And/or W/wo  03/09/2016  CLINICAL DATA:  Lower abdominal discomfort, dizziness EXAM: CT ANGIOGRAPHY CHEST, ABDOMEN AND PELVIS TECHNIQUE: Multidetector CT imaging through the chest, abdomen and pelvis was  performed using the standard protocol during bolus administration of intravenous contrast. Multiplanar reconstructed images and MIPs were obtained and reviewed to evaluate the vascular anatomy. CONTRAST:  100 mL Isovue 370 IV COMPARISON:  CTA chest abdomen pelvis dated 01/20/2016. FINDINGS: CT CHEST FINDINGS On unenhanced CT, there is no evidence of mediastinal hematoma. No evidence of thoracic aortic aneurysm or dissection. Although not tailored for evaluation, there is no evidence of pulmonary embolism. Mediastinum/Nodes: Mild cardiomegaly.  No pericardial effusion. Three vessel coronary atherosclerosis. Postsurgical changes related to prior CABG. Atherosclerotic calcifications of the aortic arch. 13 mm short axis subcarinal node. Visualized thyroid is grossly unremarkable. Lungs/Pleura: Evaluation of lung parenchyma is constrained by respiratory motion. Mild emphysematous changes. No suspicious pulmonary nodules. No focal consolidation. No pleural effusion or pneumothorax. Musculoskeletal: Very mild degenerative changes of the mid thoracic spine. No rib fracture is seen. CT ABDOMEN PELVIS FINDINGS No evidence of abdominal aortic aneurysm or dissection. 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 SMA stent. Bilateral iliac artery stents. Right common femoral artery stent. 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 SMA is patent. Atherosclerosis at the origins of the bilateral renal arteries, which remain patent. Bilateral iliac arterial systems are patent. Hepatobiliary: Liver is within normal limits. Gallbladder is underdistended but unremarkable. No intrahepatic or extrahepatic ductal dilatation. Pancreas: Within normal limits. Spleen: Mildly heterogeneous perfusion. Adrenals/Urinary Tract: Adrenal glands are within normal limits. Kidneys are within normal limits.  No hydronephrosis. Bladder is decompressed with indwelling Foley  catheter. Stomach/Bowel: Stomach is notable for a tiny hiatal hernia. No evidence of bowel obstruction. Sigmoid diverticulosis, without evidence of diverticulitis. Vascular/Lymphatic: Vascular findings as above. No suspicious abdominopelvic lymphadenopathy Reproductive: Status post hysterectomy. No adnexal masses. Other: No abdominopelvic ascites. Musculoskeletal: Mild L1 compression fracture deformity with prior vertebral augmentation. Mild superior endplate compression fracture deformity at L4, new. No retropulsion. Review of the MIP images confirms the above findings. IMPRESSION: No evidence of thoracoabdominal aortic aneurysm or dissection. No evidence of pulmonary embolism. Three vessel coronary atherosclerosis.  Status post CABG. SMA, bilateral iliac, and right common femoral artery vascular stents, as above. Mild superior endplate compression fracture deformity at L4, new. Prior mild L1 compression fracture deformity with vertebral augmentation. Additional ancillary findings as above. Electronically Signed   By: Julian Hy M.D.   On: 03/09/2016 18:54    EKG:   Orders placed or performed during the hospital encounter of 03/09/16  . ED EKG 12-Lead  . ED EKG 12-Lead  . EKG 12-Lead  . EKG 12-Lead    ASSESSMENT AND PLAN:   #  Sepsis ([place]) likely secondary to UTI -Continue  fluid rehydration,. -Blood cultures With no growth less than 24 hours and urine cultures are pending -Continue IV antibiotics vancomycin and Zosyn pharmacy to manage -Patient is hemodynamically stable -Dr. Ola Spurr is her primary care physician, consult placed    # Urinary retention with foley placement -Foley in place. Patient has appointment with urologist next week on 6/14 CT angiogram of the chest and abdomen has revealed no pulmonary embolism, aneurysm or dissection  . HLD (hyperlipidemia) -Stable, resume home medications  . Ischemic colitis ([place]) -Stable.  . Peripheral vascular disease  ([place]) -Stable. Continue home  medication aspirin and statin  . Vitamin B12 deficiency anemia - resume home medications     All the records are reviewed and case discussed with Care Management/Social Workerr. Management plans discussed with the patient, family and they are in agreement.  CODE STATUS: fc  TOTAL TIME TAKING CARE OF THIS PATIENT: 40minutes.   POSSIBLE D/C IN 2 DAYS, DEPENDING ON CLINICAL CONDITION.  Note: This dictation was prepared with Dragon dictation along with smaller phrase technology. Any transcriptional errors that result from this process are unintentional.   Nicholes Mango M.D on 03/10/2016 at 3:16 PM  Between 7am to 6pm - Pager - [phone] After 6pm go to [email or web address] - password [email or web address] [facility]  [facility]  Office  [phone]  CC: Primary care physician; Leonel Ramsay, MD

## 2016-03-10 NOTE — Progress Notes (Addendum)
Pharmacy Antibiotic Note  Kirsten Castro is a 55 y.o. female admitted on 03/09/2016 with sepsis.  Pharmacy has been consulted for Zosyn and vancomycin dosing.  Plan: Zosyn: Continue Zosyn 3.375 g IV q8 hours.   Vancomycin: Will change Vancomycin dose to 750 mg IV q12 hours. Trough level prior to the 18:00 dose on 6/10.    Height: 5\' 1"  (154.9 cm) Weight: 153 lb (69.4 kg) IBW/kg (Calculated) : 47.8  Temp (24hrs), Avg:97.9 F (36.6 C), Min:97.8 F (36.6 C), Max:98.1 F (36.7 C)   Recent Labs Lab 03/07/16 03/08/16 2000 03/09/16 1645 03/09/16 1729 03/10/16 0640  WBC 7.2 6.5 9.5  --  5.4  CREATININE 0.97 0.82 1.18*  --  0.76  LATICACIDVEN  --   --   --  1.5  --     Estimated Creatinine Clearance: 73.2 mL/min (by C-G formula based on Cr of 0.76).    Allergies  Allergen Reactions  . Ferrous Sulfate Hives    Thank you for allowing pharmacy to be a part of this patient's care.  Jaliana Medellin D, Pharm.D., BCPS Clinical Pharmacist 03/10/2016 3:17 PM

## 2016-03-11 LAB — CBC
HCT: 39.1 % (ref 35.0–47.0)
Hemoglobin: 13.3 g/dL (ref 12.0–16.0)
MCH: 33.2 pg (ref 26.0–34.0)
MCHC: 34 g/dL (ref 32.0–36.0)
MCV: 97.4 fL (ref 80.0–100.0)
PLATELETS: 200 10*3/uL (ref 150–440)
RBC: 4.01 MIL/uL (ref 3.80–5.20)
RDW: 14.8 % — ABNORMAL HIGH (ref 11.5–14.5)
WBC: 5.8 10*3/uL (ref 3.6–11.0)

## 2016-03-11 LAB — BASIC METABOLIC PANEL
Anion gap: 5 (ref 5–15)
BUN: 16 mg/dL (ref 6–20)
CALCIUM: 8.9 mg/dL (ref 8.9–10.3)
CHLORIDE: 104 mmol/L (ref 101–111)
CO2: 26 mmol/L (ref 22–32)
CREATININE: 0.67 mg/dL (ref 0.44–1.00)
Glucose, Bld: 107 mg/dL — ABNORMAL HIGH (ref 65–99)
Potassium: 4.1 mmol/L (ref 3.5–5.1)
SODIUM: 135 mmol/L (ref 135–145)

## 2016-03-11 LAB — URINE CULTURE

## 2016-03-11 MED ORDER — SENNOSIDES-DOCUSATE SODIUM 8.6-50 MG PO TABS: 1.0000 | ORAL_TABLET | Freq: Every evening | ORAL | Status: AC | PRN

## 2016-03-11 MED ORDER — SACCHAROMYCES BOULARDII 250 MG PO CAPS: 250.0000 mg | ORAL_CAPSULE | Freq: Two times a day (BID) | ORAL | Status: DC

## 2016-03-11 MED ORDER — AMOXICILLIN 500 MG PO CAPS: 500.0000 mg | ORAL_CAPSULE | Freq: Three times a day (TID) | ORAL | Status: DC

## 2016-03-11 MED ORDER — OXYCODONE HCL 5 MG PO TABS: 5.0000 mg | ORAL_TABLET | Freq: Two times a day (BID) | ORAL | Status: DC | PRN

## 2016-03-11 MED ORDER — POTASSIUM CHLORIDE CRYS ER 10 MEQ PO TBCR: 10.0000 meq | EXTENDED_RELEASE_TABLET | Freq: Every day | ORAL | Status: DC

## 2016-03-11 NOTE — Discharge Summary (Signed)
[facility] NAME: Kirsten Castro    MR#:  [id number]  DATE OF BIRTH:  05/11/1961  DATE OF ADMISSION:  03/09/2016 ADMITTING PHYSICIAN: Quintella Baton, MD  DATE OF DISCHARGE:  03/11/16  PRIMARY CARE PHYSICIAN: Leonel Ramsay, MD    ADMISSION DIAGNOSIS:  UTI (lower urinary tract infection) [N39.0] Lower abdominal pain [R10.30] Sepsis, due to unspecified organism ([place]) [A41.9]  DISCHARGE DIAGNOSIS:  Active Problems:   Peripheral vascular disease ([place])   Ischemic colitis ([place])   HLD (hyperlipidemia)   Vitamin B12 deficiency anemia   Sepsis ([place])   Urinary retention   CAD (coronary artery disease)   SECONDARY DIAGNOSIS:   Past Medical History  Diagnosis Date  . Stroke ([place])   . Coronary artery disease   . COPD (chronic obstructive pulmonary disease) ([place])   . Myocardial infarction ([place])   . Anginal pain ([place])   . Hypertension   . Cancer ([place])     melanoma skin cancer    HOSPITAL COURSE:  # Sepsis ([place]) likely secondary to UTI -fluid rehydration provided -Blood cultures With no growth less than 24 hours and urine cultures are with gr B strep - d/c with po amoxicillin  - provided  IV antibiotics vancomycin and Zosyn , changed to po amoxicillin at d/c  -Patient is hemodynamically stable -Dr. Ola Spurr is her primary care physician, op f/u recommended    # Urinary retention with foley placement -Foley in place. Patient has appointment with urologist next week on 6/14 -foley care instructions given to pt and husband -d/c home with foley and leg bag CT angiogram of the chest and abdomen has revealed no pulmonary embolism, aneurysm or dissection  . HLD (hyperlipidemia) -Stable, resume home medications  . Ischemic colitis ([place]) -Stable.  . Peripheral vascular disease ([place]) -Stable. Continue home medication aspirin and statin  . Vitamin B12 deficiency anemia - resume home medications   DISCHARGE  CONDITIONS:   fair  CONSULTS OBTAINED:      PROCEDURES none  DRUG ALLERGIES:   Allergies  Allergen Reactions  . Ferrous Sulfate Hives    DISCHARGE MEDICATIONS:   Current Discharge Medication List    START taking these medications   Details  amoxicillin (AMOXIL) 500 MG capsule Take 1 capsule (500 mg total) by mouth 3 (three) times daily. Qty: 15 capsule, Refills: 0    saccharomyces boulardii (FLORASTOR) 250 MG capsule Take 1 capsule (250 mg total) by mouth 2 (two) times daily. Qty: 30 capsule, Refills: 0    senna-docusate (SENOKOT-S) 8.6-50 MG tablet Take 1 tablet by mouth at bedtime as needed for mild constipation.      CONTINUE these medications which have CHANGED   Details  potassium chloride (K-DUR,KLOR-CON) 10 MEQ tablet Take 1 tablet (10 mEq total) by mouth daily.      CONTINUE these medications which have NOT CHANGED   Details  acetaminophen (TYLENOL) 500 MG tablet Take 1,000 mg by mouth every 6 (six) hours as needed for mild pain or headache.    ALPRAZolam (XANAX) 1 MG tablet Take 1 mg by mouth 3 (three) times daily.    aspirin 81 MG EC tablet Take 81 mg by mouth daily.     atorvastatin (LIPITOR) 80 MG tablet Take 80 mg by mouth daily.    carvedilol (COREG) 12.5 MG tablet Take 12.5 mg by mouth 2 (two) times daily with a meal.    cetirizine (ZYRTEC) 10 MG tablet Take 10 mg by mouth  daily. Reported on 02/08/2016    clopidogrel (PLAVIX) 75 MG tablet Take 75 mg by mouth daily.     fenofibrate (TRICOR) 145 MG tablet Take 145 mg by mouth daily.     Fluticasone-Salmeterol (ADVAIR) 250-50 MCG/DOSE AEPB Inhale 1 puff into the lungs 2 (two) times daily.    lisinopril (ZESTRIL) 10 MG tablet Take 1 tablet (10 mg total) by mouth daily. Qty: 60 tablet, Refills: 1    Magnesium 250 MG TABS Take 250 mg by mouth 2 (two) times daily.    Oxycodone HCl 20 MG TABS Take 1 tablet by mouth every 8 (eight) hours.    pantoprazole (PROTONIX) 40 MG tablet Take 1 tablet (40 mg  total) by mouth 2 (two) times daily. Qty: 60 tablet, Refills: 1    tamsulosin (FLOMAX) 0.4 MG CAPS capsule Take 1 capsule (0.4 mg total) by mouth daily. Qty: 10 capsule, Refills: 0    traZODone (DESYREL) 50 MG tablet Take 50 mg by mouth at bedtime as needed for sleep.          DISCHARGE INSTRUCTIONS:   Activity - as tolerated Diet - cardiac F/u with pcp in a week F/u with dr.Brandon as scheduled 6/14 Continue foley care as instructed until urology f/u    DIET:  cardiac  DISCHARGE CONDITION:  fair  ACTIVITY:  As tolerated  OXYGEN:  Home Oxygen: no   Oxygen Delivery: RA  DISCHARGE LOCATION:  HOME with foley and leg bag  If you experience worsening of your admission symptoms, develop shortness of breath, life threatening emergency, suicidal or homicidal thoughts you must seek medical attention immediately by calling 911 or calling your MD immediately  if symptoms less severe.  You Must read complete instructions/literature along with all the possible adverse reactions/side effects for all the Medicines you take and that have been prescribed to you. Take any new Medicines after you have completely understood and accpet all the possible adverse reactions/side effects.   Please note  You were cared for by a hospitalist during your hospital stay. If you have any questions about your discharge medications or the care you received while you were in the hospital after you are discharged, you can call the unit and asked to speak with the hospitalist on call if the hospitalist that took care of you is not available. Once you are discharged, your primary care physician will handle any further medical issues. Please note that NO REFILLS for any discharge medications will be authorized once you are discharged, as it is imperative that you return to your primary care physician (or establish a relationship with a primary care physician if you do not have one) for your aftercare needs so  that they can reassess your need for medications and monitor your lab values.     Today  Chief Complaint  Patient presents with  . foley catheter problem   . Hypotension   Pt is doing fine and reporting urinary incontinance , has an apt with urology on 6/14. Wants to go home  ROS:  CONSTITUTIONAL: Denies fevers, chills. Denies any fatigue, weakness.  EYES: Denies blurry vision, double vision, eye pain. EARS, NOSE, THROAT: Denies tinnitus, ear pain, hearing loss. RESPIRATORY: Denies cough, wheeze, shortness of breath.  CARDIOVASCULAR: Denies chest pain, palpitations, edema.  GASTROINTESTINAL: Denies nausea, vomiting, diarrhea, abdominal pain. Denies bright red blood per rectum. GENITOURINARY: Denies dysuria, hematuria. Reports ch urinary incontinance ENDOCRINE: Denies nocturia or thyroid problems. HEMATOLOGIC AND LYMPHATIC: Denies easy bruising or bleeding. SKIN: Denies  rash or lesion. MUSCULOSKELETAL: Denies pain in neck, back, shoulder, knees, hips or arthritic symptoms.  NEUROLOGIC: Denies paralysis, paresthesias.  PSYCHIATRIC: Denies anxiety or depressive symptoms.   VITAL SIGNS:  Blood pressure 172/78, pulse 72, temperature 97.9 F (36.6 C), temperature source Oral, resp. rate 16, height 5\' 1"  (1.549 m), weight 69.4 kg (153 lb), SpO2 100 %.  I/O:    Intake/Output Summary (Last 24 hours) at 03/11/16 1709 Last data filed at 03/11/16 1516  Gross per 24 hour  Intake    880 ml  Output   5050 ml  Net  -4170 ml    PHYSICAL EXAMINATION:  GENERAL:  55 y.o.-year-old patient lying in the bed with no acute distress.  EYES: Pupils equal, round, reactive to light and accommodation. No scleral icterus. Extraocular muscles intact.  HEENT: Head atraumatic, normocephalic. Oropharynx and nasopharynx clear.  NECK:  Supple, no jugular venous distention. No thyroid enlargement, no tenderness.  LUNGS: Normal breath sounds bilaterally, no wheezing, rales,rhonchi or crepitation. No use of  accessory muscles of respiration.  CARDIOVASCULAR: S1, S2 normal. No murmurs, rubs, or gallops.  ABDOMEN: Soft, non-tender, non-distended. Bowel sounds present. No organomegaly or mass.  EXTREMITIES: No pedal edema, cyanosis, or clubbing.  NEUROLOGIC: Cranial nerves II through XII are intact. Muscle strength 5/5 in all extremities. Sensation intact. Gait not checked.  PSYCHIATRIC: The patient is alert and oriented x 3.  SKIN: No obvious rash, lesion, or ulcer.   DATA REVIEW:   CBC  Recent Labs Lab 03/11/16 0335  WBC 5.8  HGB 13.3  HCT 39.1  PLT 200    Chemistries   Recent Labs Lab 03/07/16 03/08/16 2000  03/11/16 0335  NA 128* 134*  < > 135  K 4.1 4.0  < > 4.1  CL 93* 96*  < > 104  CO2 24 26  < > 26  GLUCOSE 105* 104*  < > 107*  BUN 15 18  < > 16  CREATININE 0.97 0.82  < > 0.67  CALCIUM 9.3 10.0  < > 8.9  MG 1.7  --   --   --   AST 22 22  --   --   ALT 19 19  --   --   ALKPHOS 112 116  --   --   BILITOT 0.8 1.3*  --   --   < > = values in this interval not displayed.  Cardiac Enzymes  Recent Labs Lab 03/09/16 1613  TROPONINI <0.03    Microbiology Results  Results for orders placed or performed during the hospital encounter of 03/09/16  Urine culture     Status: Abnormal   Collection Time: 03/09/16  4:47 PM  Result Value Ref Range Status   Specimen Description URINE, RANDOM  Final   Special Requests NONE  Final   Culture (A)  Final    20,000 COLONIES/mL GROUP B STREP(S.AGALACTIAE)ISOLATED Virtually 100% of S. agalactiae (Group B) strains are susceptible to Penicillin.  For Penicillin-allergic patients, Erythromycin (85-95% sensitive) and Clindamycin (80% sensitive) are drugs of choice. Contact microbiology lab to request sensitivities if  needed within 7 days. Performed at [facility]    Report Status 03/11/2016 FINAL  Final  Blood Culture (routine x 2)     Status: None (Preliminary result)   Collection Time: 03/09/16  5:29 PM  Result Value Ref  Range Status   Specimen Description BLOOD RIGHT ASSIST CONTROL  Final   Special Requests BOTTLES DRAWN AEROBIC AND ANAEROBIC [place]  Final  Culture NO GROWTH 2 DAYS  Final   Report Status PENDING  Incomplete  Blood Culture (routine x 2)     Status: None (Preliminary result)   Collection Time: 03/09/16  5:30 PM  Result Value Ref Range Status   Specimen Description BLOOD LEFT ASSIST CONTROL  Final   Special Requests BOTTLES DRAWN AEROBIC AND ANAEROBIC 2CCAERO,2CCANA  Final   Culture NO GROWTH 2 DAYS  Final   Report Status PENDING  Incomplete  MRSA PCR Screening     Status: None   Collection Time: 03/09/16 11:35 PM  Result Value Ref Range Status   MRSA by PCR NEGATIVE NEGATIVE Final    Comment:        The GeneXpert MRSA Assay (FDA approved for NASAL specimens only), is one component of a comprehensive MRSA colonization surveillance program. It is not intended to diagnose MRSA infection nor to guide or monitor treatment for MRSA infections.     RADIOLOGY:  Dg Chest Port 1 View  03/09/2016  CLINICAL DATA:  Sepsis, low blood pressure EXAM: PORTABLE CHEST 1 VIEW COMPARISON:  03/07/2016 FINDINGS: There is no focal parenchymal opacity. There is no pleural effusion or pneumothorax. The heart and mediastinal contours are unremarkable. There is evidence of prior CABG. The osseous structures are unremarkable. IMPRESSION: No active disease. Electronically Signed   By: Kathreen Devoid   On: 03/09/2016 17:39   Mm Digital Screening Bilateral  03/08/2016  CLINICAL DATA:  Screening. EXAM: DIGITAL SCREENING BILATERAL MAMMOGRAM WITH CAD COMPARISON:  Previous exam(s). ACR Breast Density Category b: There are scattered areas of fibroglandular density. FINDINGS: There are no findings suspicious for malignancy. Images were processed with CAD. IMPRESSION: No mammographic evidence of malignancy. A result letter of this screening mammogram will be mailed directly to the patient. RECOMMENDATION: Screening  mammogram in one year. (Code:SM-B-01Y) BI-RADS CATEGORY  1: Negative. Electronically Signed   By: Fidela Salisbury M.D.   On: 03/08/2016 12:03   Ct Angio Chest/abd/pel For Dissection W And/or W/wo  03/09/2016  CLINICAL DATA:  Lower abdominal discomfort, dizziness EXAM: CT ANGIOGRAPHY CHEST, ABDOMEN AND PELVIS TECHNIQUE: Multidetector CT imaging through the chest, abdomen and pelvis was performed using the standard protocol during bolus administration of intravenous contrast. Multiplanar reconstructed images and MIPs were obtained and reviewed to evaluate the vascular anatomy. CONTRAST:  100 mL Isovue 370 IV COMPARISON:  CTA chest abdomen pelvis dated 01/20/2016. FINDINGS: CT CHEST FINDINGS On unenhanced CT, there is no evidence of mediastinal hematoma. No evidence of thoracic aortic aneurysm or dissection. Although not tailored for evaluation, there is no evidence of pulmonary embolism. Mediastinum/Nodes: Mild cardiomegaly.  No pericardial effusion. Three vessel coronary atherosclerosis. Postsurgical changes related to prior CABG. Atherosclerotic calcifications of the aortic arch. 13 mm short axis subcarinal node. Visualized thyroid is grossly unremarkable. Lungs/Pleura: Evaluation of lung parenchyma is constrained by respiratory motion. Mild emphysematous changes. No suspicious pulmonary nodules. No focal consolidation. No pleural effusion or pneumothorax. Musculoskeletal: Very mild degenerative changes of the mid thoracic spine. No rib fracture is seen. CT ABDOMEN PELVIS FINDINGS No evidence of abdominal aortic aneurysm or dissection. 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 SMA stent. Bilateral iliac artery stents. Right common femoral artery stent. 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 SMA is patent. Atherosclerosis at the origins of the bilateral renal arteries, which remain patent. Bilateral iliac arterial systems are  patent. Hepatobiliary: Liver is within normal limits. Gallbladder is underdistended but  unremarkable. No intrahepatic or extrahepatic ductal dilatation. Pancreas: Within normal limits. Spleen: Mildly heterogeneous perfusion. Adrenals/Urinary Tract: Adrenal glands are within normal limits. Kidneys are within normal limits.  No hydronephrosis. Bladder is decompressed with indwelling Foley catheter. Stomach/Bowel: Stomach is notable for a tiny hiatal hernia. No evidence of bowel obstruction. Sigmoid diverticulosis, without evidence of diverticulitis. Vascular/Lymphatic: Vascular findings as above. No suspicious abdominopelvic lymphadenopathy Reproductive: Status post hysterectomy. No adnexal masses. Other: No abdominopelvic ascites. Musculoskeletal: Mild L1 compression fracture deformity with prior vertebral augmentation. Mild superior endplate compression fracture deformity at L4, new. No retropulsion. Review of the MIP images confirms the above findings. IMPRESSION: No evidence of thoracoabdominal aortic aneurysm or dissection. No evidence of pulmonary embolism. Three vessel coronary atherosclerosis.  Status post CABG. SMA, bilateral iliac, and right common femoral artery vascular stents, as above. Mild superior endplate compression fracture deformity at L4, new. Prior mild L1 compression fracture deformity with vertebral augmentation. Additional ancillary findings as above. Electronically Signed   By: Julian Hy M.D.   On: 03/09/2016 18:54    EKG:   Orders placed or performed during the hospital encounter of 03/09/16  . ED EKG 12-Lead  . ED EKG 12-Lead  . EKG 12-Lead  . EKG 12-Lead      Management plans discussed with the patient, family and they are in agreement.  CODE STATUS:     Code Status Orders        Start     Ordered   03/09/16 2316  Full code   Continuous     03/09/16 2315    Code Status History    Date Active Date Inactive Code Status Order ID Comments User Context    01/21/2016  3:11 AM 01/25/2016  2:59 PM Full Code [id number]  Harrie Foreman, MD Inpatient   12/15/2015 10:44 PM 12/18/2015  7:56 PM Full Code [id number]  Vaughan Basta, MD Inpatient   02/10/2015 12:02 PM 02/12/2015  7:12 PM Full Code [id number]  Algernon Huxley, MD Inpatient      TOTAL TIME TAKING CARE OF THIS PATIENT: 45 minutes.   Note: This dictation was prepared with Dragon dictation along with smaller phrase technology. Any transcriptional errors that result from this process are unintentional.   @MEC @  on 03/11/2016 at 5:09 PM  Between 7am to 6pm - Pager - [phone]  After 6pm go to [email or web address] - password [email or web address]  [facility]  Office  [phone]  CC: Primary care physician; Leonel Ramsay, MD

## 2016-03-11 NOTE — Progress Notes (Signed)
Patient was discharged home via wheelchair and CNA.  IV removed with cath intact. Changed foley bag to leg bag.  Instructed both patient and husband multiple times. Gave handouts and reviewed.  Used teach back for trouble shotting foley and care.  Husband was able to demo and explain these before discharge. Scripts were sent with patient and husband. Had a discussion about follow-up appointments.  Allowed time for questions.

## 2016-03-11 NOTE — Progress Notes (Signed)
   Wilmon Arms, RN      [id number] Mar 11, 2016 11:37 AM [facility], confirmed that patient got Rx from [facility] on Feb 23, 2016 for Oxycodone 5mg  (1tab) PRN Q12 hours.    In March Dr. Delana Meyer prescribed Oxycodone 20 mg Q8 hr PRN No refills.   Laural Benes, [facility]      Fri Mar 10, 2016 12:14 AM Patient last filled 5 mg tablets #30 for 15 days, 02/29/2016 from Dr. Arvella Nigh in orthopedics. Patient says she takes 20 mg po Q8H scheduled, when she can (has struggled with constipation). She wasn't aware Dr. Arvella Nigh was decreasing her dose. I entered 20 mg po Q8H x 2 doses for tonight. Please f/u with Dr. Arvella Nigh' office in AM

## 2016-03-11 NOTE — Discharge Instructions (Signed)
Activity - as tolerated Diet - cardiac F/u with pcp in a week F/u with dr.Brandon as scheduled 6/14 Continue foley care as instructed until urology f/u

## 2016-03-11 NOTE — Progress Notes (Signed)
Spoke with Dr. Margaretmary Eddy about clarification of home dosing of oxycodone.  Gave verbal orders for 5mg  BID PRN.  Orders placed in computer.

## 2016-03-13 ENCOUNTER — Telehealth: Payer: Self-pay

## 2016-03-13 NOTE — Telephone Encounter (Signed)
Called Dr. Vinie Sill office to ask what was said and done at patient's consult on 03/06/2016.   I then will need to notify Dr. Dahlia Byes.

## 2016-03-14 LAB — CULTURE, BLOOD (ROUTINE X 2)
CULTURE: NO GROWTH
Culture: NO GROWTH

## 2016-03-15 ENCOUNTER — Encounter: Payer: Self-pay | Admitting: Dermatology

## 2016-03-15 ENCOUNTER — Encounter: Payer: Self-pay | Admitting: Urology

## 2016-03-15 ENCOUNTER — Ambulatory Visit (INDEPENDENT_AMBULATORY_CARE_PROVIDER_SITE_OTHER): Payer: BLUE CROSS/BLUE SHIELD | Admitting: Urology

## 2016-03-15 VITALS — BP 132/78 | HR 87 | Ht 61.0 in | Wt 145.5 lb

## 2016-03-15 DIAGNOSIS — N390 Urinary tract infection, site not specified: Secondary | ICD-10-CM | POA: Diagnosis not present

## 2016-03-15 DIAGNOSIS — T83511A Infection and inflammatory reaction due to indwelling urethral catheter, initial encounter: Secondary | ICD-10-CM

## 2016-03-15 DIAGNOSIS — R339 Retention of urine, unspecified: Secondary | ICD-10-CM

## 2016-03-15 LAB — BLADDER SCAN AMB NON-IMAGING

## 2016-03-15 NOTE — Telephone Encounter (Signed)
Called Dr. Vinie Sill office again and left them a voicemail.

## 2016-03-15 NOTE — Progress Notes (Signed)
03/15/2016 9:01 AM   Kirsten Castro April 09, 1961 [id number]  Referring provider: Leonel Ramsay, MD [address] [place], [place] [address]  Chief Complaint  Patient presents with  . New Patient (Initial Visit)    urinary retention    HPI: 55 year old female who presents today for further evaluation of urinary retention.   She initially presented to the emergency room on 03/08/2016 with difficulty emptying her bladder. A Foley catheter was placed and she was discharged home with urology follow-up. At that time, her UA was completely clear. She returned to the emergency room the following day with leakage around her catheter. A repeat urinalysis was more suspicious for an infection. Urine culture ultimately grew group B strep. She was admitted to the hospital for 2 days for treatment of her UTI and associated hypotension.  She continues on oral antibiotics at this time.  In addition, she has been started on Flomax.  She has no previous history of urinary retention. Patient is a very difficult historian today and its unclear whether or not she has any history of voiding dysfunction prior to this episode.  She does have innumerable medical problems including history of stroke, CAD, COPD, chronic back pain status post recent kyphoplasty for an L1 compression fracture on 02/08/2016. She is on narcotics.  She is very anxious to have her catheter removed today.  She is accompanied today by her husband.   PMH: Past Medical History  Diagnosis Date  . Stroke ([place])   . Coronary artery disease   . COPD (chronic obstructive pulmonary disease) ([place])   . Myocardial infarction ([place])   . Anginal pain ([place])   . Hypertension   . Cancer ([place])     melanoma skin cancer  . 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  This was done at [facility] in August, 2015.  Postoperative course complicated by mediastinitis treated with sternal resection.  Last Assessment & Plan:  Relevant Hx: Course: Daily Update: Today's Plan:   . BP (high blood pressure) 02/03/2016  . Carotid stenosis 02/10/2015  . Peripheral vascular disease ([place]) 02/03/2016    Overview:  Follows with Dr. Delana Meyer    . Temporary cerebral vascular dysfunction 02/03/2016  . Colitis 12/15/2015  . Esophageal candidiasis ([place])   . Gastritis   . GI bleed 12/15/2015  . Ischemic colitis ([place]) 01/01/2016    Overview:   SMA mesenteric ischemia now status post angioplasty by vascular of In stent restenosis.   Overview:  S?P SMA angioplasty   . Nausea and vomiting in adult   . Noninfectious diarrhea     Surgical History: Past Surgical History  Procedure Laterality Date  . Coronary artery bypass graft    . Peripheral vascular catheterization N/A 02/10/2015    Procedure: Carotid PTA/Stent Intervention;  Surgeon: Katha Cabal, MD;  Location: [place] [facility];  Service: Cardiovascular;  Laterality: N/A;  . Stent placement vascular ([facility] hx)    . Peripheral vascular catheterization N/A 12/17/2015    Procedure: Visceral Angiography;  Surgeon: Katha Cabal, MD;  Location:  [place] [facility];  Service: Cardiovascular;  Laterality: N/A;  . Peripheral vascular catheterization N/A 12/17/2015    Procedure: Visceral Artery Intervention;  Surgeon: Katha Cabal, MD;  Location: [place] [facility];  Service: Cardiovascular;  Laterality: N/A;  . Abdominal hysterectomy    . Other surgical history  Jul 11 2014    sternum removal  . Nose surgery      cancer removal  . Esophagogastroduodenoscopy (egd) with propofol N/A  01/24/2016    Procedure: ESOPHAGOGASTRODUODENOSCOPY (EGD) WITH PROPOFOL;  Surgeon: Lucilla Lame, MD;  Location: [facility];  Service: Endoscopy;  Laterality: N/A;  . Colonoscopy with propofol N/A 01/24/2016    Procedure: COLONOSCOPY WITH PROPOFOL;  Surgeon: Lucilla Lame, MD;  Location: [facility];  Service: Endoscopy;  Laterality: N/A;  . Kyphoplasty N/A 02/08/2016    Procedure: KYPHOPLASTY L1;  Surgeon: Hessie Knows, MD;  Location: [facility]S;  Service: Orthopedics;  Laterality: N/A;  . Breast biopsy Left 6 2017    results not in yet    Home Medications:    Medication List       This list is accurate as of: 03/15/16 11:59 PM.  Always use your most recent med list.               acetaminophen 500 MG tablet  Commonly known as:  TYLENOL  Take 1,000 mg by mouth every 6 (six) hours as needed for mild pain or headache.     ADVAIR DISKUS 250-50 MCG/DOSE Aepb  Generic drug:  Fluticasone-Salmeterol  Inhale into the lungs.     ALPRAZolam 1 MG tablet  Commonly known as:  XANAX  Take 1 mg by mouth 3 (three) times daily.     amoxicillin 500 MG capsule  Commonly known as:  AMOXIL  Take 1 capsule (500 mg total) by mouth 3 (three) times daily.     aspirin 81 MG EC tablet  Take 81 mg by mouth daily.     atorvastatin 80 MG tablet  Commonly known as:  LIPITOR  Take 80 mg by mouth daily.     carvedilol 12.5 MG tablet  Commonly known as:  COREG  Take 12.5 mg by mouth 2 (two) times daily with a meal.     cetirizine 10 MG tablet  Commonly known as:  ZYRTEC  Take 10 mg by mouth daily. Reported on 02/08/2016     clopidogrel 75 MG tablet  Commonly known as:  PLAVIX  Take 75 mg by mouth daily.     CLOTRIMAZOLE ANTI-FUNGAL 1 % cream  Generic drug:  clotrimazole     fenofibrate 145 MG tablet  Commonly known as:  TRICOR  Take 145 mg by mouth daily. Reported on 03/15/2016     HYDROcodone-acetaminophen 5-325 MG tablet  Commonly known as:  NORCO/VICODIN  TK 1 T PO  Q 6 HOURS PRN FOR  MODERATE PAIN     lisinopril 10 MG tablet  Commonly known as:  ZESTRIL  Take 1 tablet (10 mg total) by mouth daily.     Magnesium 250 MG Tabs  Take 250 mg by mouth 2 (two) times daily.     ondansetron 4 MG disintegrating tablet  Commonly known as:  ZOFRAN-ODT  Take by mouth.     Oxycodone HCl 20 MG Tabs  Take 1 tablet by mouth every 8 (eight) hours. Reported on 03/15/2016     pantoprazole 40 MG tablet  Commonly known as:  PROTONIX  Take 1 tablet (40 mg total) by mouth 2 (  two) times daily.     potassium chloride 10 MEQ CR capsule  Commonly known as:  MICRO-K  Take by mouth. Reported on 03/15/2016     potassium chloride 10 MEQ tablet  Commonly known as:  K-DUR,KLOR-CON  Take 1 tablet (10 mEq total) by mouth daily.     saccharomyces boulardii 250 MG capsule  Commonly known as:  FLORASTOR  Take 1 capsule (250 mg total) by mouth 2 (two) times daily.     senna-docusate 8.6-50 MG tablet  Commonly known as:  Senokot-S  Take 1 tablet by mouth at bedtime as needed for mild constipation.     tamsulosin 0.4 MG Caps capsule  Commonly known as:  FLOMAX  Take 1 capsule (0.4 mg total) by mouth daily.     traZODone 50 MG tablet  Commonly known as:  DESYREL  Take 50 mg by mouth at bedtime as needed for sleep. Reported on 03/15/2016        Allergies:  Allergies  Allergen Reactions  . Ferrous Sulfate Hives    Family History: Family History  Problem Relation Age of Onset  . Breast cancer Sister   . CAD Father   . CAD Brother     Social History:  reports that she quit smoking about 3 years ago. She has never used smokeless tobacco. She reports that she does not drink alcohol or use illicit drugs.  ROS: UROLOGY Frequent Urination?: No Hard to postpone urination?: Yes Burning/pain with urination?: No Get up at night to urinate?: No Leakage of urine?: Yes Urine stream starts and stops?: No Trouble starting stream?: No Do you have to strain to urinate?: No Blood in urine?:  No Urinary tract infection?: Yes Sexually transmitted disease?: No Injury to kidneys or bladder?: No Painful intercourse?: No Weak stream?: Yes Currently pregnant?: No Vaginal bleeding?: No Last menstrual period?: no  Gastrointestinal Nausea?: Yes Vomiting?: Yes Indigestion/heartburn?: No Diarrhea?: Yes Constipation?: Yes  Constitutional Fever: No Night sweats?: Yes Weight loss?: No Fatigue?: Yes  Skin Skin rash/lesions?: No Itching?: No  Eyes Blurred vision?: No Double vision?: No  Ears/Nose/Throat Sore throat?: No Sinus problems?: No  Hematologic/Lymphatic Swollen glands?: No Easy bruising?: Yes  Cardiovascular Leg swelling?: No Chest pain?: Yes  Respiratory Cough?: No Shortness of breath?: Yes  Endocrine Excessive thirst?: Yes  Musculoskeletal Back pain?: Yes Joint pain?: No  Neurological Headaches?: Yes Dizziness?: Yes  Psychologic Depression?: Yes Anxiety?: Yes  Physical Exam: BP 132/78 mmHg  Pulse 87  Ht 5\' 1"  (1.549 m)  Wt 145 lb 8 oz (65.998 kg)  BMI 27.51 kg/m2  Constitutional:  Alert and oriented, No acute distress.   HEENT: [place] AT, moist mucus membranes.  Trachea midline, no masses. Cardiovascular: No clubbing, cyanosis, or edema. Respiratory: Normal respiratory effort, no increased work of breathing. GI: Abdomen is soft, nontender, nondistended, no abdominal masses GU: No CVA tenderness.  Foley catheter in place draining clear yellow urine. Skin: No rashes, bruises or suspicious lesions. Lymph: No cervical adenopathy. Neurologic: Grossly intact, no focal deficits, moving all 4 extremities. Psychiatric: Tangential, difficult to follow and unable to answer all questions appropriately.    Laboratory Data: Lab Results  Component Value Date   WBC 5.8 03/11/2016   HGB 13.3 03/11/2016   HCT 39.1 03/11/2016   MCV 97.4 03/11/2016   PLT 200 03/11/2016    Lab Results  Component Value Date   CREATININE 0.67 03/11/2016    Lab  Results  Component Value Date   HGBA1C 5.7 01/20/2016  Urinalysis Component     Latest Ref Rng 03/08/2016 03/09/2016  Color, Urine     YELLOW COLORLESS (A) AMBER (A)  Appearance     CLEAR CLEAR (A) CLOUDY (A)  Glucose     NEGATIVE mg/dL NEGATIVE NEGATIVE  Bilirubin Urine     NEGATIVE NEGATIVE NEGATIVE  Ketones, ur     NEGATIVE mg/dL NEGATIVE NEGATIVE  Specific Gravity, Urine     1.005 - 1.030 1.004 (L) 1.012  Hgb urine dipstick     NEGATIVE NEGATIVE 3+ (A)  pH     5.0 - 8.0 6.0 6.0  Protein     NEGATIVE mg/dL NEGATIVE 100 (A)  Nitrite     NEGATIVE NEGATIVE POSITIVE (A)  Leukocytes, UA     NEGATIVE NEGATIVE 2+ (A)  RBC / HPF     0 - 5 RBC/hpf 0-5 TOO NUMEROUS TO COUNT  WBC, UA     0 - 5 WBC/hpf NONE SEEN TOO NUMEROUS TO COUNT  Bacteria, UA     NONE SEEN NONE SEEN NONE SEEN  Squamous Epithelial / LPF     NONE SEEN 0-5 (A) 6-30 (A)  Mucous       PRESENT  Hyaline Casts, UA      PRESENT PRESENT    Pertinent Imaging: CT angiogram of chest abdomen and pelvis reviewed from 03/09/2016. This reveals normal kidneys without stones and indwelling Foley catheter. There is no other GU pathology appreciated.  There is mild superior endplate compression fracture deformity at L4 which is new appreciated on the scan.  Assessment & Plan:  55 yo F with acute urinary retention requiring Foley catheter complicated by catheter associated urinary tract infection requiring admission. She underwent successful voiding trial today and was able to empty completely with a minimal postvoid residual.  Etiology of urinary retention likely multifactorial, including narcotics use/ pharmacological with chronic pain following recent surgery amongst others. Should retention persists, may consider MRI of lumbar spine especially in the setting of new compression fracture of L4 which also may be contributing to her acute symptoms. Incidentally, she is quite tangential today and very difficult to follow. There  is some concern for substance abuse, will check urine drug screen as this may also be contributing to her presentation.  1. Urinary retention S/p sucessful voiding trial today Return if unable to void Continue Flomax at this time Follow-up urine drug screen RTC in 4 weeks to reasses recheck PVR - Drugs of abuse scrn w alc, routine urine - BLADDER SCAN AMB NON-IMAGING  2. Urinary tract infection associated with catheterization of urinary tract, initial encounter Complete abx course- GBS usually a contaminant but her UA was convincingly positive for infection on her second emergency room visit.   Return in about 4 weeks (around 04/12/2016) for PVR, symptoms recheck (any provider).  Hollice Espy, MD  [facility] [address] [place], [place] [address] [phone]

## 2016-03-15 NOTE — Progress Notes (Signed)
Fill and Pull Catheter Removal  Patient is present today for a catheter removal.  Patient was cleaned and prepped in a sterile fashion 334ml of sterile water/ saline was instilled into the bladder. 49ml of water was then drained from the balloon.  A 16FR foley cath was removed from the bladder complications were noted as: patient had a bladder spasm as catheter was removed and urine was voided on the table patient was not able to make it to the restroom, no discomfort was noted and patient did not have the urge to urinate .  Patient tolerated well, she did not have a urge to urinate but did try to void with out success, a PVR was then preformed which showed only 83ml.   Preformed by: Fonnie Jarvis, CMA  Follow up/ Additional notes: 4weeks

## 2016-03-15 NOTE — Telephone Encounter (Signed)
Sent Dr. Dahlia Byes a staff message on patient's results and awaiting for recommendations.

## 2016-03-16 ENCOUNTER — Ambulatory Visit
Admission: RE | Admit: 2016-03-16 | Discharge: 2016-03-16 | Disposition: A | Discharge status: Home / Self Care | Payer: BLUE CROSS/BLUE SHIELD | Source: Ambulatory Visit | Attending: Orthopedic Surgery | Admitting: Orthopedic Surgery

## 2016-03-16 DIAGNOSIS — S32040A Wedge compression fracture of fourth lumbar vertebra, initial encounter for closed fracture: Secondary | ICD-10-CM | POA: Insufficient documentation

## 2016-03-16 DIAGNOSIS — X58XXXA Exposure to other specified factors, initial encounter: Secondary | ICD-10-CM | POA: Insufficient documentation

## 2016-03-16 NOTE — Telephone Encounter (Signed)
Called patient and spoke to her husband Shanon Brow because he stated that his wife was in the shower. Therefore, Shanon [place] stated that I was able to tell him what I was going to tell his wife. I then checked if I was able to due to HIPAA. He was in the chart. I told Mr. [place] that his wife's pathology was negative (benign) and that her Mammogram was also negative. Shanon [place] understood and stated that he will let his wife know.

## 2016-03-22 LAB — DRUG PROFILE 799031
BENZODIAZEPINES: POSITIVE — AB
HYDROXYALPRAZOLAM: POSITIVE — AB
NORDIAZEPAM: NEGATIVE
OH-ALPRAZOLAM GC/MS CONF: 1470 ng/mL
OXAZEPAM UR: NEGATIVE

## 2016-03-22 LAB — URINE DRUGS OF ABUSE SCREEN W ALC, ROUTINE (REF LAB)
AMPHETAMINES, URINE: NEGATIVE ng/mL
BARBITURATE QUANT UR: NEGATIVE ng/mL
CANNABINOID QUANT UR: NEGATIVE ng/mL
Cocaine (Metab.): NEGATIVE ng/mL
ETHANOL U, QUAN: NEGATIVE %
METHADONE SCREEN, URINE: NEGATIVE ng/mL
PCP QUANT UR: NEGATIVE ng/mL
PROPOXYPHENE: NEGATIVE ng/mL

## 2016-03-22 LAB — OPIATES CONFIRMATION, URINE: OPIATES: NEGATIVE

## 2016-03-24 ENCOUNTER — Ambulatory Visit
Admission: RE | Admit: 2016-03-24 | Discharge: 2016-03-24 | Disposition: A | Discharge status: Home / Self Care | Payer: BLUE CROSS/BLUE SHIELD | Source: Ambulatory Visit | Attending: Orthopedic Surgery | Admitting: Orthopedic Surgery

## 2016-03-24 DIAGNOSIS — X58XXXA Exposure to other specified factors, initial encounter: Secondary | ICD-10-CM | POA: Insufficient documentation

## 2016-03-24 DIAGNOSIS — Z01812 Encounter for preprocedural laboratory examination: Secondary | ICD-10-CM | POA: Diagnosis not present

## 2016-03-24 DIAGNOSIS — S32040A Wedge compression fracture of fourth lumbar vertebra, initial encounter for closed fracture: Secondary | ICD-10-CM | POA: Insufficient documentation

## 2016-03-24 HISTORY — DX: Gastro-esophageal reflux disease without esophagitis: K21.9

## 2016-03-24 HISTORY — DX: Headache, unspecified: R51.9

## 2016-03-24 HISTORY — DX: Anemia, unspecified: D64.9

## 2016-03-24 HISTORY — DX: Major depressive disorder, single episode, unspecified: F32.9

## 2016-03-24 HISTORY — DX: Headache: R51

## 2016-03-24 HISTORY — DX: Anxiety disorder, unspecified: F41.9

## 2016-03-24 HISTORY — DX: Reserved for inherently not codable concepts without codable children: IMO0001

## 2016-03-24 HISTORY — DX: Chronic kidney disease, unspecified: N18.9

## 2016-03-24 HISTORY — DX: Depression, unspecified: F32.A

## 2016-03-24 LAB — CBC
HCT: 45.7 % (ref 35.0–47.0)
Hemoglobin: 16 g/dL (ref 12.0–16.0)
MCH: 32.4 pg (ref 26.0–34.0)
MCHC: 35 g/dL (ref 32.0–36.0)
MCV: 92.6 fL (ref 80.0–100.0)
PLATELETS: 283 10*3/uL (ref 150–440)
RBC: 4.94 MIL/uL (ref 3.80–5.20)
RDW: 14.4 % (ref 11.5–14.5)
WBC: 9.5 10*3/uL (ref 3.6–11.0)

## 2016-03-24 LAB — DIFFERENTIAL
BASOS PCT: 0 %
Basophils Absolute: 0 10*3/uL (ref 0–0.1)
EOS PCT: 1 %
Eosinophils Absolute: 0.1 10*3/uL (ref 0–0.7)
LYMPHS PCT: 11 %
Lymphs Abs: 1 10*3/uL (ref 1.0–3.6)
Monocytes Absolute: 0.8 10*3/uL (ref 0.2–0.9)
Monocytes Relative: 8 %
NEUTROS ABS: 7.5 10*3/uL — AB (ref 1.4–6.5)
NEUTROS PCT: 80 %

## 2016-03-24 LAB — SURGICAL PCR SCREEN
MRSA, PCR: NEGATIVE
STAPHYLOCOCCUS AUREUS: POSITIVE — AB

## 2016-03-24 NOTE — Patient Instructions (Signed)
  Your procedure is scheduled on: March 28, 2016 (Tuesday) Report to Day Surgery.[facility]  SECOND FLOOR To find out your arrival time please call [phone] between 1PM - 3PM on March 27, 2016 (Monday).  Remember: Instructions that are not followed completely may result in serious medical risk, up to and including death, or upon the discretion of your surgeon and anesthesiologist your surgery may need to be rescheduled.    __x__ 1. Do not eat food or drink liquids after midnight. No gum chewing or hard candies.     __x__ 2. No Alcohol for 24 hours before or after surgery.   __x__ 3. Do Not Smoke For 24 Hours Prior to Your Surgery.   ____ 4. Bring all medications with you on the day of surgery if instructed.    __x__ 5. Notify your doctor if there is any change in your medical condition     (cold, fever, infections).       Do not wear jewelry, make-up, hairpins, clips or nail polish.  Do not wear lotions, powders, or perfumes. You may wear deodorant.  Do not shave 48 hours prior to surgery. Men may shave face and neck.  Do not bring valuables to the hospital.    [facility] is not responsible for any belongings or valuables.               Contacts, dentures or bridgework may not be worn into surgery.  Leave your suitcase in the car. After surgery it may be brought to your room.  For patients admitted to the hospital, discharge time is determined by your                treatment team.   Patients discharged the day of surgery will not be allowed to drive home.   Please read over the following fact sheets that you were given:   MRSA Information and Surgical Site Infection Prevention   __x__ Take these medicines the morning of surgery with A SIP OF WATER:    1. CARVEDILOL  2. LISINOPRIL  3. MAGNESIUM  4. PANTOPRAZOLE  5.  6.  ____ Fleet Enema (as directed)   _x___ Use CHG Soap as directed  ____ Use inhalers on the day of surgery  ____ Stop metformin 2 days prior to  surgery    ____ Take 1/2 of usual insulin dose the night before surgery and none on the morning of surgery.   __x__ Stop Coumadin/Plavix/aspirin on (ASK DR. MENZ ABOUT STOPPING ASPIRIN AND PLAVIX)  ____ Stop Anti-inflammatories on (NO NSAIDS)  TYLENOL OK TO TAKE FOR PAIN IF NEEDED   ____ Stop supplements until after surgery.    ____ Bring C-Pap to the hospital.

## 2016-03-24 NOTE — Pre-Procedure Instructions (Signed)
Medical clearance called and faxed to Dr. Rudene Christians office (spoke to [place]).

## 2016-03-24 NOTE — Pre-Procedure Instructions (Signed)
Cardiac clearance received from Dr.Fath and on chart.

## 2016-03-24 NOTE — Pre-Procedure Instructions (Signed)
Dr. Kayleen Memos notified of patient medical history and requested clearance from PCP.

## 2016-03-28 ENCOUNTER — Ambulatory Visit
Admission: RE | Admit: 2016-03-28 | Payer: BLUE CROSS/BLUE SHIELD | Source: Ambulatory Visit | Admitting: Orthopedic Surgery

## 2016-03-28 ENCOUNTER — Ambulatory Visit
Admission: RE | Admit: 2016-03-28 | Discharge: 2016-03-28 | Disposition: A | Discharge status: Home / Self Care | Payer: BLUE CROSS/BLUE SHIELD | Source: Ambulatory Visit | Attending: Orthopedic Surgery | Admitting: Orthopedic Surgery

## 2016-03-28 ENCOUNTER — Encounter: Admission: RE | Payer: Self-pay | Source: Ambulatory Visit

## 2016-03-28 ENCOUNTER — Encounter
Admission: RE | Disposition: A | Discharge status: Home / Self Care | Payer: Self-pay | Source: Ambulatory Visit | Attending: Orthopedic Surgery

## 2016-03-28 ENCOUNTER — Encounter: Payer: Self-pay | Admitting: *Deleted

## 2016-03-28 ENCOUNTER — Ambulatory Visit: Payer: BLUE CROSS/BLUE SHIELD | Admitting: Anesthesiology

## 2016-03-28 ENCOUNTER — Ambulatory Visit: Payer: BLUE CROSS/BLUE SHIELD

## 2016-03-28 DIAGNOSIS — I739 Peripheral vascular disease, unspecified: Secondary | ICD-10-CM | POA: Diagnosis not present

## 2016-03-28 DIAGNOSIS — I10 Essential (primary) hypertension: Secondary | ICD-10-CM | POA: Diagnosis not present

## 2016-03-28 DIAGNOSIS — I252 Old myocardial infarction: Secondary | ICD-10-CM | POA: Diagnosis not present

## 2016-03-28 DIAGNOSIS — M4856XA Collapsed vertebra, not elsewhere classified, lumbar region, initial encounter for fracture: Secondary | ICD-10-CM | POA: Insufficient documentation

## 2016-03-28 DIAGNOSIS — F419 Anxiety disorder, unspecified: Secondary | ICD-10-CM | POA: Diagnosis not present

## 2016-03-28 DIAGNOSIS — Z862 Personal history of diseases of the blood and blood-forming organs and certain disorders involving the immune mechanism: Secondary | ICD-10-CM | POA: Diagnosis not present

## 2016-03-28 DIAGNOSIS — Z8673 Personal history of transient ischemic attack (TIA), and cerebral infarction without residual deficits: Secondary | ICD-10-CM | POA: Insufficient documentation

## 2016-03-28 DIAGNOSIS — F329 Major depressive disorder, single episode, unspecified: Secondary | ICD-10-CM | POA: Insufficient documentation

## 2016-03-28 DIAGNOSIS — K219 Gastro-esophageal reflux disease without esophagitis: Secondary | ICD-10-CM | POA: Insufficient documentation

## 2016-03-28 DIAGNOSIS — Z955 Presence of coronary angioplasty implant and graft: Secondary | ICD-10-CM | POA: Diagnosis not present

## 2016-03-28 DIAGNOSIS — Z87891 Personal history of nicotine dependence: Secondary | ICD-10-CM | POA: Diagnosis not present

## 2016-03-28 DIAGNOSIS — R0602 Shortness of breath: Secondary | ICD-10-CM | POA: Insufficient documentation

## 2016-03-28 DIAGNOSIS — J449 Chronic obstructive pulmonary disease, unspecified: Secondary | ICD-10-CM | POA: Insufficient documentation

## 2016-03-28 DIAGNOSIS — I2510 Atherosclerotic heart disease of native coronary artery without angina pectoris: Secondary | ICD-10-CM | POA: Insufficient documentation

## 2016-03-28 DIAGNOSIS — Z951 Presence of aortocoronary bypass graft: Secondary | ICD-10-CM | POA: Insufficient documentation

## 2016-03-28 DIAGNOSIS — Z419 Encounter for procedure for purposes other than remedying health state, unspecified: Secondary | ICD-10-CM

## 2016-03-28 HISTORY — PX: KYPHOPLASTY: SHX5884

## 2016-03-28 SURGERY — KYPHOPLASTY
Anesthesia: Choice

## 2016-03-28 SURGERY — KYPHOPLASTY
Anesthesia: General | Wound class: Clean

## 2016-03-28 MED ORDER — HYDROCODONE-ACETAMINOPHEN 5-325 MG PO TABS
1.0000 | ORAL_TABLET | ORAL | Status: DC | PRN
  Administered 2016-03-28: 1 via ORAL

## 2016-03-28 MED ORDER — HYDROCODONE-ACETAMINOPHEN 5-325 MG PO TABS
ORAL_TABLET | ORAL | Status: AC
  Filled 2016-03-28: qty 1

## 2016-03-28 MED ORDER — CEFAZOLIN IN D5W 1 GM/50ML IV SOLN
INTRAVENOUS | Status: AC
  Filled 2016-03-28: qty 50

## 2016-03-28 MED ORDER — ONDANSETRON HCL 4 MG/2ML IJ SOLN
INTRAMUSCULAR | Status: AC
  Filled 2016-03-28: qty 2

## 2016-03-28 MED ORDER — CEFAZOLIN IN D5W 1 GM/50ML IV SOLN
INTRAVENOUS | Status: AC
  Administered 2016-03-28: 1 g via INTRAVENOUS
  Filled 2016-03-28: qty 50

## 2016-03-28 MED ORDER — LACTATED RINGERS IV SOLN: INTRAVENOUS | Status: DC

## 2016-03-28 MED ORDER — LACTATED RINGERS IV SOLN
INTRAVENOUS | Status: DC
  Administered 2016-03-28 (×2): via INTRAVENOUS

## 2016-03-28 MED ORDER — FENTANYL CITRATE (PF) 100 MCG/2ML IJ SOLN
INTRAMUSCULAR | Status: AC
  Administered 2016-03-28: 25 ug via INTRAVENOUS
  Filled 2016-03-28: qty 2

## 2016-03-28 MED ORDER — ONDANSETRON HCL 4 MG PO TABS: 4.0000 mg | ORAL_TABLET | Freq: Four times a day (QID) | ORAL | Status: DC | PRN

## 2016-03-28 MED ORDER — METOCLOPRAMIDE HCL 5 MG/ML IJ SOLN: 5.0000 mg | Freq: Three times a day (TID) | INTRAMUSCULAR | Status: DC | PRN

## 2016-03-28 MED ORDER — FENTANYL CITRATE (PF) 100 MCG/2ML IJ SOLN
25.0000 ug | INTRAMUSCULAR | Status: DC | PRN
  Administered 2016-03-28 (×2): 25 ug via INTRAVENOUS

## 2016-03-28 MED ORDER — HYDROCODONE-ACETAMINOPHEN 5-325 MG PO TABS: 1.0000 | ORAL_TABLET | Freq: Four times a day (QID) | ORAL | Status: DC | PRN

## 2016-03-28 MED ORDER — ONDANSETRON HCL 4 MG/2ML IJ SOLN
4.0000 mg | Freq: Once | INTRAMUSCULAR | Status: AC
Start: 2016-03-28 — End: 2016-03-28
  Administered 2016-03-28: 4 mg via INTRAVENOUS

## 2016-03-28 MED ORDER — MIDAZOLAM HCL 2 MG/2ML IJ SOLN
INTRAMUSCULAR | Status: DC | PRN
  Administered 2016-03-28: 2 mg via INTRAVENOUS

## 2016-03-28 MED ORDER — ONDANSETRON HCL 4 MG/2ML IJ SOLN: 4.0000 mg | Freq: Four times a day (QID) | INTRAMUSCULAR | Status: DC | PRN

## 2016-03-28 MED ORDER — LIDOCAINE HCL 1 % IJ SOLN
INTRAMUSCULAR | Status: DC | PRN
  Administered 2016-03-28: 20 mL

## 2016-03-28 MED ORDER — BUPIVACAINE-EPINEPHRINE (PF) 0.5% -1:200000 IJ SOLN
INTRAMUSCULAR | Status: AC
  Filled 2016-03-28: qty 30

## 2016-03-28 MED ORDER — IOPAMIDOL (ISOVUE-M 200) INJECTION 41%
INTRAMUSCULAR | Status: AC
  Filled 2016-03-28: qty 20

## 2016-03-28 MED ORDER — PROPOFOL 500 MG/50ML IV EMUL
INTRAVENOUS | Status: DC | PRN
  Administered 2016-03-28: 75 ug/kg/min via INTRAVENOUS

## 2016-03-28 MED ORDER — CEFAZOLIN IN D5W 1 GM/50ML IV SOLN: 1.0000 g | Freq: Once | INTRAVENOUS | Status: DC

## 2016-03-28 MED ORDER — ONDANSETRON HCL 4 MG/2ML IJ SOLN
4.0000 mg | Freq: Once | INTRAMUSCULAR | Status: AC | PRN
Start: 2016-03-28 — End: 2016-03-28
  Administered 2016-03-28: 4 mg via INTRAVENOUS

## 2016-03-28 MED ORDER — METOCLOPRAMIDE HCL 10 MG PO TABS: 5.0000 mg | ORAL_TABLET | Freq: Three times a day (TID) | ORAL | Status: DC | PRN

## 2016-03-28 MED ORDER — BUPIVACAINE-EPINEPHRINE (PF) 0.5% -1:200000 IJ SOLN
INTRAMUSCULAR | Status: DC | PRN
  Administered 2016-03-28: 40 mL via PERINEURAL

## 2016-03-28 MED ORDER — ONDANSETRON HCL 4 MG/2ML IJ SOLN
INTRAMUSCULAR | Status: AC
  Administered 2016-03-28: 4 mg via INTRAVENOUS
  Filled 2016-03-28: qty 2

## 2016-03-28 MED ORDER — LIDOCAINE HCL (PF) 1 % IJ SOLN
INTRAMUSCULAR | Status: AC
  Filled 2016-03-28: qty 60

## 2016-03-28 MED ORDER — SODIUM CHLORIDE 0.9 % IV SOLN: INTRAVENOUS | Status: DC

## 2016-03-28 SURGICAL SUPPLY — 13 items
CEMENT KYPHON CX01A KIT/MIXER (Cement) ×3 IMPLANT
DEVICE BIOPSY BONE KYPHX (INSTRUMENTS) ×3 IMPLANT
DRAPE C-ARM XRAY 36X54 (DRAPES) ×3 IMPLANT
DURAPREP 26ML APPLICATOR (WOUND CARE) ×3 IMPLANT
GLOVE SURG ORTHO 9.0 STRL STRW (GLOVE) ×3 IMPLANT
GOWN STRL REUS W/ TWL LRG LVL3 (GOWN DISPOSABLE) ×1 IMPLANT
GOWN STRL REUS W/TWL LRG LVL3 (GOWN DISPOSABLE) ×2
GOWN SURG XXL (GOWNS) ×3 IMPLANT
LIQUID BAND (GAUZE/BANDAGES/DRESSINGS) ×3 IMPLANT
PACK KYPHOPLASTY (MISCELLANEOUS) ×3 IMPLANT
STRAP SAFETY BODY (MISCELLANEOUS) ×3 IMPLANT
TRAY KYPHOPAK 15/3 EXPRESS 1ST (MISCELLANEOUS) ×3 IMPLANT
TRAY KYPHOPAK 20/3 EXPRESS 1ST (MISCELLANEOUS) IMPLANT

## 2016-03-28 NOTE — Anesthesia Preprocedure Evaluation (Signed)
Anesthesia Evaluation  Patient identified by MRN, date of birth, ID band Patient awake    Reviewed: Allergy & Precautions, NPO status , Patient's Chart, lab work & pertinent test results, reviewed documented beta blocker date and time   History of Anesthesia Complications Negative for: history of anesthetic complications  Airway Mallampati: II       Dental  (+) Upper Dentures   Pulmonary shortness of breath and with exertion, COPD,  COPD inhaler, former smoker,    Pulmonary exam normal        Cardiovascular hypertension, Pt. on medications and Pt. on home beta blockers + angina with exertion + CAD, + Past MI, + Cardiac Stents, + CABG and + Peripheral Vascular Disease  Normal cardiovascular exam     Neuro/Psych  Headaches, Anxiety Depression TIA (unsure of symptoms)CVA    GI/Hepatic Neg liver ROS, GERD  Medicated and Poorly Controlled,  Endo/Other  negative endocrine ROS  Renal/GU Renal InsufficiencyRenal diseasenegative Renal ROS  negative genitourinary   Musculoskeletal Compression Fxs   Abdominal Normal abdominal exam  (+)   Peds  Hematology  (+) anemia ,   Anesthesia Other Findings   Reproductive/Obstetrics                             Anesthesia Physical  Anesthesia Plan  ASA: III  Anesthesia Plan: General   Post-op Pain Management:    Induction: Intravenous  Airway Management Planned: Nasal Cannula  Additional Equipment:   Intra-op Plan:   Post-operative Plan:   Informed Consent: I have reviewed the patients History and Physical, chart, labs and discussed the procedure including the risks, benefits and alternatives for the proposed anesthesia with the patient or authorized representative who has indicated his/her understanding and acceptance.   Dental advisory given  Plan Discussed with: CRNA and Surgeon  Anesthesia Plan Comments:         Anesthesia Quick  Evaluation

## 2016-03-28 NOTE — Anesthesia Postprocedure Evaluation (Signed)
Anesthesia Post Note  Patient: Kirsten Castro  Procedure(s) Performed: Procedure(s) (LRB): KYPHOPLASTY (N/A)  Patient location during evaluation: PACU Anesthesia Type: General Level of consciousness: awake and alert and oriented Pain management: pain level controlled Vital Signs Assessment: post-procedure vital signs reviewed and stable Respiratory status: spontaneous breathing Cardiovascular status: blood pressure returned to baseline Anesthetic complications: no    Last Vitals:  Filed Vitals:   03/28/16 1440 03/28/16 1451  BP: 103/64 121/56  Pulse: 71 71  Temp: 36.2 C 36.6 C  Resp: 21 20    Last Pain:  Filed Vitals:   03/28/16 1455  PainSc: 8                  [name]

## 2016-03-28 NOTE — Anesthesia Procedure Notes (Signed)
Date/Time: 03/28/2016 1:15 PM Performed by: Nelda Marseille Pre-anesthesia Checklist: Patient identified, Emergency Drugs available, Suction available, Patient being monitored and Timeout performed Oxygen Delivery Method: Nasal cannula

## 2016-03-28 NOTE — Progress Notes (Signed)
zofran given for nausea  

## 2016-03-28 NOTE — Op Note (Signed)
03/28/2016  1:53 PM  PATIENT:  Kirsten Castro  55 y.o. female  PRE-OPERATIVE DIAGNOSIS:  L4 COMPRESSION FRACTURE  POST-OPERATIVE DIAGNOSIS:  L4 COMPRESSION FRACTURE  PROCEDURE:  Procedure(s): KYPHOPLASTY (N/A)  SURGEON: Laurene Footman, MD  ASSISTANTS: None  ANESTHESIA:   local and MAC  EBL:     BLOOD ADMINISTERED:none  DRAINS: none   LOCAL MEDICATIONS USED:  MARCAINE    and XYLOCAINE   SPECIMEN:  Source of Specimen:  L4 body  DISPOSITION OF SPECIMEN:  PATHOLOGY  COUNTS:  YES  TOURNIQUET:  * No tourniquets in log *  IMPLANTS: Bone cement  DICTATION: .Dragon Dictation patient brought the operating room and after adequate anesthesia was obtained patient was placed prone. C-arm was brought in and good visualization of L4 in both AP and lateral projections obtained. After appropriate patient identification and timeout procedure completed, 10 cc of 1% Xylocaine was infiltrated subcutaneously after prepping with alcohol. For this the back was prepped and draped and repeat timeout procedure carried out. Local anesthetic was then infiltrated down to the pedicle on both sides with a mixture of 1% Xylocaine, 0.5% Sensorcaine with epinephrine, a total of 20 cc on each side. Additional 5 cc 1% Xylocaine was infiltrated for local right at the level of the skin. A small stab incision made on the right side and a trocar advanced and extrapedicular fashion. On advancing the biopsy needle the biopsy was obtained with good bone specimen. Next drilling was carried out in the balloon didn't cross the midline so uni pedicular approach was chosen and left side not approached. Balloon was inserted and inflated to approximately 3 to have cc and 1 cement was a appropriate consistency approximate 4 to have cc of cement and infiltrated into the vertebral body getting very good fill without extravasation. After the cemented set the trochars removed and permanent C-arm views were obtained. Wound was closed  with Dermabond followed by a Band-Aid  PLAN OF CARE: Discharge to home after PACU  PATIENT DISPOSITION:  PACU - hemodynamically stable.

## 2016-03-28 NOTE — Transfer of Care (Signed)
Immediate Anesthesia Transfer of Care Note  Patient: Kirsten Castro  Procedure(s) Performed: Procedure(s): KYPHOPLASTY (N/A)  Patient Location: PACU  Anesthesia Type:General  Level of Consciousness: awake and sedated  Airway & Oxygen Therapy: Patient Spontanous Breathing and Patient connected to nasal cannula oxygen  Post-op Assessment: Report given to RN and Post -op Vital signs reviewed and stable  Post vital signs: Reviewed and stable  Last Vitals:  Filed Vitals:   03/28/16 1138  BP: 100/56  Pulse: 79  Temp: 36.7 C  Resp: 16    Last Pain:  Filed Vitals:   03/28/16 1140  PainSc: 10-Worst pain ever         Complications: No apparent anesthesia complications

## 2016-03-28 NOTE — H&P (Signed)
Reviewed paper H+P, will be scanned into chart. No changes noted.  

## 2016-03-28 NOTE — Discharge Instructions (Addendum)
Remove Band-Aid on Thursday. Okay to shower after that. Pain medicine as directed. Minimal activity today and tomorrow, on Thursday resume normal activity  AMBULATORY SURGERY  DISCHARGE INSTRUCTIONS   1) The drugs that you were given will stay in your system until tomorrow so for the next 24 hours you should not:  A) Drive an automobile B) Make any legal decisions C) Drink any alcoholic beverage   2) You may resume regular meals tomorrow.  Today it is better to start with liquids and gradually work up to solid foods.  You may eat anything you prefer, but it is better to start with liquids, then soup and crackers, and gradually work up to solid foods.   3) Please notify your doctor immediately if you have any unusual bleeding, trouble breathing, redness and pain at the surgery site, drainage, fever, or pain not relieved by medication.    4) Additional Instructions:   Please contact your physician with any problems or Same Day Surgery at [phone], Monday through Friday 6 am to 4 pm, or [place] at [facility] number at [phone].

## 2016-03-29 ENCOUNTER — Encounter: Payer: Self-pay | Admitting: Orthopedic Surgery

## 2016-03-30 LAB — SURGICAL PATHOLOGY

## 2016-04-11 ENCOUNTER — Telehealth: Payer: Self-pay | Admitting: Urology

## 2016-04-11 NOTE — Telephone Encounter (Signed)
Patient called the office this afternoon with compliant of difficulty urinating and swollen abdomen. She has a history of urinary retention.  After speaking with [facility] (triage nurse), I offered for the patient to come into the office NOW to have a bladder scan.  She declined and asked for an appointment with the nurse tomorrow, 7/12 at 10:00am.  Appointment made and chart documented.

## 2016-04-12 ENCOUNTER — Ambulatory Visit (INDEPENDENT_AMBULATORY_CARE_PROVIDER_SITE_OTHER): Payer: BLUE CROSS/BLUE SHIELD | Admitting: *Deleted

## 2016-04-12 ENCOUNTER — Encounter: Payer: Self-pay | Admitting: *Deleted

## 2016-04-12 VITALS — BP 164/90 | HR 88 | Ht 61.0 in | Wt 147.9 lb

## 2016-04-12 DIAGNOSIS — R339 Retention of urine, unspecified: Secondary | ICD-10-CM | POA: Diagnosis not present

## 2016-04-12 LAB — BLADDER SCAN AMB NON-IMAGING: SCAN RESULT: 270

## 2016-04-12 MED ORDER — LIDOCAINE HCL 2 % EX GEL: 1.0000 "application " | Freq: Once | CUTANEOUS | Status: DC

## 2016-04-12 NOTE — Progress Notes (Signed)
Patient came to office for PVR because she does not have any feeling or warning that she has to urinate. Patient states when she stands up it all comes out. Her PVR is 270. Spoke to Dr. Pilar Jarvis and he told me to put a foley in place and keep it in for 15 days and for patient to recheck sxs and voiding trial with Dr. Erlene Quan. Patient did not want the Foley and after 30 minutes of talking with patient and back and forth, I finally told the patient she had to make the decision and she decided to have foley placed. Follow up appointment made and patient counseled and told to call office back with any problems.  Simple Catheter Placement  Due to urinary retention patient is present today for a foley cath placement.  Patient was cleaned and prepped in a sterile fashion with betadine and lidocaine jelly 2% was instilled into the urethra.  A 16 FR foley catheter was inserted, urine return was noted  234ml, urine was clear and light yellow in color.  The balloon was filled with 10cc of sterile water.  A 16 bag was attached for drainage. Patient was also given a night bag to take home and was given instruction on how to change from one bag to another.  Patient was given instruction on proper catheter care.  Patient tolerated well, no complications were noted   Preformed by: Arleta Creek CMA  Additional notes/ Follow up: 15 days for voiding trial and recheck sxs with Dr. Erlene Quan

## 2016-04-14 ENCOUNTER — Telehealth: Payer: Self-pay

## 2016-04-14 NOTE — Telephone Encounter (Signed)
Pt called stating she is having tingling and feels like foley is falling out. Pt denied n/v, f/c. Pt stated that her foley is still draining. Reinforced with pt as long as the foley is draining then the foley is in the proper place. Also reinforced the tingling is most likely related to the foley. Pt voiced understanding of whole conversation.

## 2016-04-18 ENCOUNTER — Ambulatory Visit: Payer: BLUE CROSS/BLUE SHIELD | Admitting: Urology

## 2016-04-18 ENCOUNTER — Observation Stay
Admission: EM | Admit: 2016-04-18 | Discharge: 2016-04-20 | Disposition: A | Discharge status: Home / Self Care | Payer: BLUE CROSS/BLUE SHIELD | Attending: Internal Medicine | Admitting: Internal Medicine

## 2016-04-18 ENCOUNTER — Telehealth: Payer: Self-pay

## 2016-04-18 DIAGNOSIS — Z8673 Personal history of transient ischemic attack (TIA), and cerebral infarction without residual deficits: Secondary | ICD-10-CM | POA: Diagnosis not present

## 2016-04-18 DIAGNOSIS — Z85820 Personal history of malignant melanoma of skin: Secondary | ICD-10-CM | POA: Diagnosis not present

## 2016-04-18 DIAGNOSIS — Z9889 Other specified postprocedural states: Secondary | ICD-10-CM | POA: Insufficient documentation

## 2016-04-18 DIAGNOSIS — I129 Hypertensive chronic kidney disease with stage 1 through stage 4 chronic kidney disease, or unspecified chronic kidney disease: Secondary | ICD-10-CM | POA: Diagnosis not present

## 2016-04-18 DIAGNOSIS — R1084 Generalized abdominal pain: Secondary | ICD-10-CM | POA: Insufficient documentation

## 2016-04-18 DIAGNOSIS — I2510 Atherosclerotic heart disease of native coronary artery without angina pectoris: Secondary | ICD-10-CM | POA: Insufficient documentation

## 2016-04-18 DIAGNOSIS — K5730 Diverticulosis of large intestine without perforation or abscess without bleeding: Secondary | ICD-10-CM | POA: Diagnosis not present

## 2016-04-18 DIAGNOSIS — F419 Anxiety disorder, unspecified: Secondary | ICD-10-CM | POA: Insufficient documentation

## 2016-04-18 DIAGNOSIS — Z7902 Long term (current) use of antithrombotics/antiplatelets: Secondary | ICD-10-CM | POA: Insufficient documentation

## 2016-04-18 DIAGNOSIS — Z79891 Long term (current) use of opiate analgesic: Secondary | ICD-10-CM | POA: Insufficient documentation

## 2016-04-18 DIAGNOSIS — I739 Peripheral vascular disease, unspecified: Secondary | ICD-10-CM | POA: Insufficient documentation

## 2016-04-18 DIAGNOSIS — F329 Major depressive disorder, single episode, unspecified: Secondary | ICD-10-CM | POA: Diagnosis not present

## 2016-04-18 DIAGNOSIS — I700 Atherosclerosis of aorta: Secondary | ICD-10-CM | POA: Diagnosis not present

## 2016-04-18 DIAGNOSIS — Z951 Presence of aortocoronary bypass graft: Secondary | ICD-10-CM | POA: Insufficient documentation

## 2016-04-18 DIAGNOSIS — Z79899 Other long term (current) drug therapy: Secondary | ICD-10-CM | POA: Insufficient documentation

## 2016-04-18 DIAGNOSIS — Z87891 Personal history of nicotine dependence: Secondary | ICD-10-CM | POA: Insufficient documentation

## 2016-04-18 DIAGNOSIS — R339 Retention of urine, unspecified: Secondary | ICD-10-CM | POA: Diagnosis present

## 2016-04-18 DIAGNOSIS — K219 Gastro-esophageal reflux disease without esophagitis: Secondary | ICD-10-CM | POA: Insufficient documentation

## 2016-04-18 DIAGNOSIS — Z955 Presence of coronary angioplasty implant and graft: Secondary | ICD-10-CM | POA: Insufficient documentation

## 2016-04-18 DIAGNOSIS — I6529 Occlusion and stenosis of unspecified carotid artery: Secondary | ICD-10-CM | POA: Diagnosis not present

## 2016-04-18 DIAGNOSIS — G8929 Other chronic pain: Principal | ICD-10-CM | POA: Insufficient documentation

## 2016-04-18 DIAGNOSIS — N189 Chronic kidney disease, unspecified: Secondary | ICD-10-CM | POA: Diagnosis not present

## 2016-04-18 DIAGNOSIS — I252 Old myocardial infarction: Secondary | ICD-10-CM | POA: Insufficient documentation

## 2016-04-18 DIAGNOSIS — R079 Chest pain, unspecified: Secondary | ICD-10-CM

## 2016-04-18 DIAGNOSIS — I517 Cardiomegaly: Secondary | ICD-10-CM | POA: Diagnosis not present

## 2016-04-18 DIAGNOSIS — Z8249 Family history of ischemic heart disease and other diseases of the circulatory system: Secondary | ICD-10-CM | POA: Insufficient documentation

## 2016-04-18 DIAGNOSIS — J449 Chronic obstructive pulmonary disease, unspecified: Secondary | ICD-10-CM | POA: Insufficient documentation

## 2016-04-18 DIAGNOSIS — K449 Diaphragmatic hernia without obstruction or gangrene: Secondary | ICD-10-CM | POA: Insufficient documentation

## 2016-04-18 DIAGNOSIS — Z803 Family history of malignant neoplasm of breast: Secondary | ICD-10-CM | POA: Insufficient documentation

## 2016-04-18 DIAGNOSIS — R109 Unspecified abdominal pain: Secondary | ICD-10-CM | POA: Diagnosis present

## 2016-04-18 DIAGNOSIS — Z90710 Acquired absence of both cervix and uterus: Secondary | ICD-10-CM | POA: Insufficient documentation

## 2016-04-18 DIAGNOSIS — Z7982 Long term (current) use of aspirin: Secondary | ICD-10-CM | POA: Insufficient documentation

## 2016-04-18 HISTORY — DX: Systemic involvement of connective tissue, unspecified: M35.9

## 2016-04-18 LAB — COMPREHENSIVE METABOLIC PANEL
ALBUMIN: 4 g/dL (ref 3.5–5.0)
ALT: 18 U/L (ref 14–54)
ANION GAP: 9 (ref 5–15)
AST: 22 U/L (ref 15–41)
Alkaline Phosphatase: 97 U/L (ref 38–126)
BILIRUBIN TOTAL: 1.2 mg/dL (ref 0.3–1.2)
BUN: 10 mg/dL (ref 6–20)
CO2: 25 mmol/L (ref 22–32)
Calcium: 9.6 mg/dL (ref 8.9–10.3)
Chloride: 99 mmol/L — ABNORMAL LOW (ref 101–111)
Creatinine, Ser: 0.55 mg/dL (ref 0.44–1.00)
GLUCOSE: 102 mg/dL — AB (ref 65–99)
POTASSIUM: 4 mmol/L (ref 3.5–5.1)
Sodium: 133 mmol/L — ABNORMAL LOW (ref 135–145)
TOTAL PROTEIN: 7 g/dL (ref 6.5–8.1)

## 2016-04-18 LAB — CBC
HCT: 45.9 % (ref 35.0–47.0)
Hemoglobin: 15.8 g/dL (ref 12.0–16.0)
MCH: 32.1 pg (ref 26.0–34.0)
MCHC: 34.3 g/dL (ref 32.0–36.0)
MCV: 93.5 fL (ref 80.0–100.0)
PLATELETS: 273 10*3/uL (ref 150–440)
RBC: 4.91 MIL/uL (ref 3.80–5.20)
RDW: 15.8 % — ABNORMAL HIGH (ref 11.5–14.5)
WBC: 8.1 10*3/uL (ref 3.6–11.0)

## 2016-04-18 LAB — URINALYSIS COMPLETE WITH MICROSCOPIC (ARMC ONLY)
BILIRUBIN URINE: NEGATIVE
Bacteria, UA: NONE SEEN
GLUCOSE, UA: NEGATIVE mg/dL
Ketones, ur: NEGATIVE mg/dL
NITRITE: NEGATIVE
Protein, ur: NEGATIVE mg/dL
SPECIFIC GRAVITY, URINE: 1.008 (ref 1.005–1.030)
Squamous Epithelial / LPF: NONE SEEN
pH: 7 (ref 5.0–8.0)

## 2016-04-18 LAB — TROPONIN I

## 2016-04-18 LAB — LIPASE, BLOOD: Lipase: 36 U/L (ref 11–51)

## 2016-04-18 MED ORDER — MORPHINE SULFATE (PF) 4 MG/ML IV SOLN
4.0000 mg | Freq: Once | INTRAVENOUS | Status: AC
  Administered 2016-04-18: 4 mg via INTRAVENOUS
  Filled 2016-04-18: qty 1

## 2016-04-18 MED ORDER — ONDANSETRON HCL 4 MG/2ML IJ SOLN
4.0000 mg | Freq: Once | INTRAMUSCULAR | Status: AC
  Administered 2016-04-18: 4 mg via INTRAVENOUS
  Filled 2016-04-18: qty 2

## 2016-04-18 NOTE — Telephone Encounter (Signed)
Pt called stating she has heart disease and she feels like she is having fluid build up in her chest. Pt stated that her cath is continuing to drain yellow urine. Reinforced with pt that a cath should not cause her to have fluid build up around her heart it, if anything it would help her get the fluid off. Reinforced with pt to call her cardiologist. Pt voiced understanding.

## 2016-04-18 NOTE — ED Notes (Signed)
MD Brown at bedside.

## 2016-04-18 NOTE — ED Notes (Signed)
Pt from home via EMS, reports abd pain n/v/d and chest pain. Pt also has catheter present states. States " my catheter blew up and got urine all over me and there was blood in it"

## 2016-04-19 ENCOUNTER — Encounter: Payer: Self-pay | Admitting: Radiology

## 2016-04-19 ENCOUNTER — Observation Stay: Payer: BLUE CROSS/BLUE SHIELD

## 2016-04-19 ENCOUNTER — Emergency Department: Payer: BLUE CROSS/BLUE SHIELD

## 2016-04-19 DIAGNOSIS — R109 Unspecified abdominal pain: Secondary | ICD-10-CM | POA: Diagnosis present

## 2016-04-19 LAB — HEMOGLOBIN A1C: Hgb A1c MFr Bld: 6.8 % — ABNORMAL HIGH (ref 4.0–6.0)

## 2016-04-19 LAB — FIBRIN DERIVATIVES D-DIMER (ARMC ONLY): FIBRIN DERIVATIVES D-DIMER (ARMC): 497 (ref 0–499)

## 2016-04-19 LAB — BRAIN NATRIURETIC PEPTIDE: B Natriuretic Peptide: 51 pg/mL (ref 0.0–100.0)

## 2016-04-19 LAB — TSH: TSH: 0.798 u[IU]/mL (ref 0.350–4.500)

## 2016-04-19 MED ORDER — MOMETASONE FURO-FORMOTEROL FUM 200-5 MCG/ACT IN AERO
2.0000 | INHALATION_SPRAY | Freq: Two times a day (BID) | RESPIRATORY_TRACT | Status: DC
  Administered 2016-04-19 – 2016-04-20 (×2): 2 via RESPIRATORY_TRACT
  Filled 2016-04-19: qty 8.8

## 2016-04-19 MED ORDER — LORATADINE 10 MG PO TABS
10.0000 mg | ORAL_TABLET | Freq: Every day | ORAL | Status: DC
  Administered 2016-04-19 – 2016-04-20 (×2): 10 mg via ORAL
  Filled 2016-04-19 (×2): qty 1

## 2016-04-19 MED ORDER — OXYCODONE HCL 5 MG PO TABS
5.0000 mg | ORAL_TABLET | ORAL | Status: DC | PRN
  Administered 2016-04-19 (×2): 5 mg via ORAL
  Filled 2016-04-19 (×2): qty 1

## 2016-04-19 MED ORDER — CYCLOBENZAPRINE HCL 10 MG PO TABS: 10.0000 mg | ORAL_TABLET | Freq: Three times a day (TID) | ORAL | Status: DC | PRN

## 2016-04-19 MED ORDER — LISINOPRIL 10 MG PO TABS
10.0000 mg | ORAL_TABLET | Freq: Every day | ORAL | Status: DC
  Administered 2016-04-19 – 2016-04-20 (×2): 10 mg via ORAL
  Filled 2016-04-19 (×2): qty 1

## 2016-04-19 MED ORDER — ACETAMINOPHEN 500 MG PO TABS: 1000.0000 mg | ORAL_TABLET | Freq: Four times a day (QID) | ORAL | Status: DC | PRN

## 2016-04-19 MED ORDER — CARVEDILOL 6.25 MG PO TABS
12.5000 mg | ORAL_TABLET | Freq: Two times a day (BID) | ORAL | Status: DC
  Administered 2016-04-19 – 2016-04-20 (×3): 12.5 mg via ORAL
  Filled 2016-04-19 (×4): qty 2

## 2016-04-19 MED ORDER — PANTOPRAZOLE SODIUM 40 MG PO TBEC
40.0000 mg | DELAYED_RELEASE_TABLET | Freq: Two times a day (BID) | ORAL | Status: DC
  Administered 2016-04-19 – 2016-04-20 (×3): 40 mg via ORAL
  Filled 2016-04-19 (×3): qty 1

## 2016-04-19 MED ORDER — FUROSEMIDE 10 MG/ML IJ SOLN
20.0000 mg | Freq: Once | INTRAMUSCULAR | Status: AC
  Administered 2016-04-19: 20 mg via INTRAVENOUS
  Filled 2016-04-19: qty 2

## 2016-04-19 MED ORDER — DOCUSATE SODIUM 100 MG PO CAPS
100.0000 mg | ORAL_CAPSULE | Freq: Two times a day (BID) | ORAL | Status: DC
  Administered 2016-04-19 – 2016-04-20 (×3): 100 mg via ORAL
  Filled 2016-04-19 (×3): qty 1

## 2016-04-19 MED ORDER — POTASSIUM CHLORIDE CRYS ER 10 MEQ PO TBCR
10.0000 meq | EXTENDED_RELEASE_TABLET | Freq: Two times a day (BID) | ORAL | Status: DC
  Administered 2016-04-19 – 2016-04-20 (×3): 10 meq via ORAL
  Filled 2016-04-19 (×3): qty 1

## 2016-04-19 MED ORDER — ENOXAPARIN SODIUM 40 MG/0.4ML ~~LOC~~ SOLN
40.0000 mg | Freq: Every day | SUBCUTANEOUS | Status: DC
  Administered 2016-04-19: 40 mg via SUBCUTANEOUS
  Filled 2016-04-19 (×2): qty 0.4

## 2016-04-19 MED ORDER — LIDOCAINE HCL 2 % EX GEL
1.0000 "application " | Freq: Once | CUTANEOUS | Status: DC
  Filled 2016-04-19: qty 5

## 2016-04-19 MED ORDER — MAGNESIUM GLUCONATE 500 MG PO TABS
250.0000 mg | ORAL_TABLET | Freq: Two times a day (BID) | ORAL | Status: DC
  Administered 2016-04-19 – 2016-04-20 (×2): 250 mg via ORAL
  Filled 2016-04-19 (×4): qty 1

## 2016-04-19 MED ORDER — SENNOSIDES-DOCUSATE SODIUM 8.6-50 MG PO TABS: 1.0000 | ORAL_TABLET | Freq: Every evening | ORAL | Status: DC | PRN

## 2016-04-19 MED ORDER — TRAZODONE HCL 50 MG PO TABS
50.0000 mg | ORAL_TABLET | Freq: Every evening | ORAL | Status: DC | PRN
  Administered 2016-04-19: 50 mg via ORAL
  Filled 2016-04-19: qty 1

## 2016-04-19 MED ORDER — PROMETHAZINE HCL 25 MG PO TABS: 12.5000 mg | ORAL_TABLET | Freq: Four times a day (QID) | ORAL | Status: DC | PRN

## 2016-04-19 MED ORDER — DIATRIZOATE MEGLUMINE & SODIUM 66-10 % PO SOLN
15.0000 mL | ORAL | Status: AC
  Administered 2016-04-19 (×2): 15 mL via ORAL

## 2016-04-19 MED ORDER — CLOPIDOGREL BISULFATE 75 MG PO TABS
75.0000 mg | ORAL_TABLET | Freq: Every day | ORAL | Status: DC
  Administered 2016-04-19 – 2016-04-20 (×2): 75 mg via ORAL
  Filled 2016-04-19 (×2): qty 1

## 2016-04-19 MED ORDER — IOPAMIDOL (ISOVUE-300) INJECTION 61%
100.0000 mL | Freq: Once | INTRAVENOUS | Status: AC | PRN
  Administered 2016-04-19: 100 mL via INTRAVENOUS

## 2016-04-19 MED ORDER — ASPIRIN EC 81 MG PO TBEC
81.0000 mg | DELAYED_RELEASE_TABLET | Freq: Every day | ORAL | Status: DC
  Administered 2016-04-19 – 2016-04-20 (×2): 81 mg via ORAL
  Filled 2016-04-19 (×2): qty 1

## 2016-04-19 MED ORDER — SODIUM CHLORIDE 0.9 % IV SOLN
INTRAVENOUS | Status: DC
  Administered 2016-04-19: 03:00:00 via INTRAVENOUS

## 2016-04-19 MED ORDER — ATORVASTATIN CALCIUM 20 MG PO TABS
80.0000 mg | ORAL_TABLET | Freq: Every day | ORAL | Status: DC
  Administered 2016-04-19 – 2016-04-20 (×2): 80 mg via ORAL
  Filled 2016-04-19 (×2): qty 4

## 2016-04-19 MED ORDER — ALPRAZOLAM 1 MG PO TABS
1.0000 mg | ORAL_TABLET | Freq: Three times a day (TID) | ORAL | Status: DC | PRN
  Administered 2016-04-19 – 2016-04-20 (×2): 1 mg via ORAL
  Filled 2016-04-19 (×2): qty 1

## 2016-04-19 NOTE — Progress Notes (Signed)
Patient transported to CT 

## 2016-04-19 NOTE — Progress Notes (Signed)
Report given to oncoming nurse. Pt in bed AAOX3. No distress noted . Husband at bedside

## 2016-04-19 NOTE — Progress Notes (Signed)
SCDs applied and medicated for pain. Pt drowsy and asleep at times when carrying a conversation. Dr Bridgett Larsson was made aware of assessment. Foley cath patent clear yellow urine

## 2016-04-19 NOTE — H&P (Signed)
Kirsten Castro is an 55 y.o. female.   Chief Complaint: Abdominal pain HPI: The patient with past medical history significant for coronary artery disease status post CABG and sternotomy following sternal infection as well as vertebral plasty 2 weeks ago presents emergency department complaining of abdominal pain as well as chest pain. Both appear to be chronic but have acutely worsened today. Laboratory dilation in the emergency department was unremarkable. After multiple doses of pain medication without relief emergency department staff called for admission.  Past Medical History  Diagnosis Date  . Coronary artery disease   . COPD (chronic obstructive pulmonary disease) ([place])   . Myocardial infarction ([place])   . Anginal pain ([place])   . Hypertension   . Cancer ([place])     melanoma skin cancer  . 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  This was done at [facility]  . BP (high blood pressure) 02/03/2016  . Carotid stenosis 02/10/2015  . Peripheral vascular disease ([place]) 02/03/2016    Overview:  Follows with Dr. Delana Meyer    . Temporary cerebral vascular dysfunction 02/03/2016  . Colitis 12/15/2015  . Esophageal candidiasis ([place])   . Gastritis   . GI bleed 12/15/2015  . 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   Overview:  S?P SMA angioplasty   . Nausea and vomiting in adult   . Noninfectious diarrhea   . Stroke [facility])     TIA X 2  . Shortness of breath dyspnea     with exertion  . Anxiety   . Depression   . Chronic  kidney disease     UTI  . GERD (gastroesophageal reflux disease)   . Headache   . Anemia     Past Surgical History  Procedure Laterality Date  . Coronary artery bypass graft    . Peripheral vascular catheterization N/A 02/10/2015    Procedure: Carotid PTA/Stent Intervention;  Surgeon: Katha Cabal, MD;  Location: [place] [facility];  Service: Cardiovascular;  Laterality: N/A;  . Stent placement vascular ([facility] h[facility])    . Peripheral vascular catheterization N/A 12/17/2015    Procedure: Visceral Angiography;  Surgeon: Katha Cabal, MD;  Location: [place] [facility];  Service: Cardiovascular;  Laterality: N/A;  . Peripheral vascular catheterization N/A 12/17/2015    Procedure: Visceral Artery Intervention;  Surgeon: Katha Cabal, MD;  Location: [place] [facility];  Service: Cardiovascular;  Laterality: N/A;  . Abdominal hysterectomy    . Other surgical history  Jul 11 2014    sternum removal  . Nose surgery      cancer removal  . Esophagogastroduodenoscopy (egd) with propofol N/A 01/24/2016    Procedure: ESOPHAGOGASTRODUODENOSCOPY (EGD) WITH PROPOFOL;  Surgeon: Lucilla Lame, MD;  Location: [facility] ENDOSCOPY;  Service: Endoscopy;  Laterality: N/A;  . Colonoscopy with propofol N/A 01/24/2016    Procedure: COLONOSCOPY WITH PROPOFOL;  Surgeon: Lucilla Lame, MD;  Location: [facility] ENDOSCOPY;  Service: Endoscopy;  Laterality: N/A;  . Kyphoplasty N/A 02/08/2016  Procedure: KYPHOPLASTY L1;  Surgeon: Hessie Knows, MD;  Location: [facility] ORS;  Service: Orthopedics;  Laterality: N/A;  . Breast biopsy Left 6 2017    results not in yet  . Coronary angioplasty    . Back surgery    . Kyphoplasty N/A 03/28/2016    Procedure: KYPHOPLASTY;  Surgeon: Hessie Knows, MD;  Location: [facility] ORS;  Service: Orthopedics;  Laterality: N/A;    Family History  Problem Relation Age of Onset  . Breast cancer Sister   . CAD Father   . CAD Brother    Social History:  reports that she quit smoking about 3 years  ago. Her smoking use included Cigarettes. She smoked 1.00 pack per day. She has never used smokeless tobacco. She reports that she does not drink alcohol or use illicit drugs.  Allergies:  Allergies  Allergen Reactions  . Ferrous Sulfate Hives    Prior to Admission medications   Medication Sig Start Date End Date Taking? Authorizing Provider  acetaminophen (TYLENOL) 500 MG tablet Take 1,000 mg by mouth every 6 (six) hours as needed for mild pain or headache.   Yes Historical Provider, MD  ALPRAZolam Duanne Moron) 1 MG tablet Take 1 mg by mouth 3 (three) times daily as needed for anxiety or sleep.    Yes Historical Provider, MD  aspirin 81 MG EC tablet Take 81 mg by mouth daily.    Yes Historical Provider, MD  atorvastatin (LIPITOR) 80 MG tablet Take 80 mg by mouth daily.   Yes Historical Provider, MD  carvedilol (COREG) 12.5 MG tablet Take 12.5 mg by mouth 2 (two) times daily with a meal.   Yes Historical Provider, MD  cetirizine (ZYRTEC) 10 MG tablet Take 10 mg by mouth daily. Reported on 02/08/2016   Yes Historical Provider, MD  clopidogrel (PLAVIX) 75 MG tablet Take 75 mg by mouth daily.  02/02/16  Yes Historical Provider, MD  cyclobenzaprine (FLEXERIL) 10 MG tablet Take 10 mg by mouth 3 (three) times daily as needed for muscle spasms.    Yes Historical Provider, MD  Fluticasone-Salmeterol (ADVAIR DISKUS) 250-50 MCG/DOSE AEPB Inhale 1 puff into the lungs 2 (two) times daily.    Yes Historical Provider, MD  lisinopril (ZESTRIL) 10 MG tablet Take 1 tablet (10 mg total) by mouth daily. 01/25/16  Yes Henreitta Leber, MD  Magnesium 250 MG TABS Take 250 mg by mouth 2 (two) times daily.   Yes Historical Provider, MD  ondansetron (ZOFRAN-ODT) 4 MG disintegrating tablet Take 4 mg by mouth every 8 (eight) hours as needed.  03/01/16  Yes Historical Provider, MD  pantoprazole (PROTONIX) 40 MG tablet Take 1 tablet (40 mg total) by mouth 2 (two) times daily. 01/25/16  Yes Henreitta Leber, MD  potassium chloride  (MICRO-K) 10 MEQ CR capsule Take 10 mEq by mouth 2 (two) times daily.    Yes Historical Provider, MD  senna-docusate (SENOKOT-S) 8.6-50 MG tablet Take 1 tablet by mouth at bedtime as needed for mild constipation. 03/11/16  Yes Nicholes Mango, MD  traZODone (DESYREL) 50 MG tablet Take 50 mg by mouth at bedtime as needed.   Yes Historical Provider, MD  HYDROcodone-acetaminophen (NORCO) 5-325 MG tablet Take 1 tablet by mouth every 6 (six) hours as needed for moderate pain. Patient not taking: Reported on 04/12/2016 03/28/16   Hessie Knows, MD  saccharomyces boulardii (FLORASTOR) 250 MG capsule Take 1 capsule (250 mg total) by mouth 2 (two) times daily. Patient not taking: Reported on 03/28/2016 03/11/16  Nicholes Mango, MD  tamsulosin (FLOMAX) 0.4 MG CAPS capsule Take 1 capsule (0.4 mg total) by mouth daily. Patient not taking: Reported on 03/28/2016 03/08/16   Schuyler Amor, MD     Results for orders placed or performed during the hospital encounter of 04/18/16 (from the past 48 hour(s))  CBC     Status: Abnormal   Collection Time: 04/18/16 10:04 PM  Result Value Ref Range   WBC 8.1 3.6 - 11.0 K/uL   RBC 4.91 3.80 - 5.20 MIL/uL   Hemoglobin 15.8 12.0 - 16.0 g/dL   HCT 45.9 35.0 - 47.0 %   MCV 93.5 80.0 - 100.0 fL   MCH 32.1 26.0 - 34.0 pg   MCHC 34.3 32.0 - 36.0 g/dL   RDW 15.8 (H) 11.5 - 14.5 %   Platelets 273 150 - 440 K/uL  Urinalysis complete, with microscopic     Status: Abnormal   Collection Time: 04/18/16 10:04 PM  Result Value Ref Range   Color, Urine YELLOW (A) YELLOW   APPearance CLEAR (A) CLEAR   Glucose, UA NEGATIVE NEGATIVE mg/dL   Bilirubin Urine NEGATIVE NEGATIVE   Ketones, ur NEGATIVE NEGATIVE mg/dL   Specific Gravity, Urine 1.008 1.005 - 1.030   Hgb urine dipstick 2+ (A) NEGATIVE   pH 7.0 5.0 - 8.0   Protein, ur NEGATIVE NEGATIVE mg/dL   Nitrite NEGATIVE NEGATIVE   Leukocytes, UA 1+ (A) NEGATIVE   RBC / HPF 0-5 0 - 5 RBC/hpf   WBC, UA 6-30 0 - 5 WBC/hpf   Bacteria, UA  NONE SEEN NONE SEEN   Squamous Epithelial / LPF NONE SEEN NONE SEEN  Lipase, blood     Status: None   Collection Time: 04/18/16 11:13 PM  Result Value Ref Range   Lipase 36 11 - 51 U/L  Comprehensive metabolic panel     Status: Abnormal   Collection Time: 04/18/16 11:13 PM  Result Value Ref Range   Sodium 133 (L) 135 - 145 mmol/L   Potassium 4.0 3.5 - 5.1 mmol/L   Chloride 99 (L) 101 - 111 mmol/L   CO2 25 22 - 32 mmol/L   Glucose, Bld 102 (H) 65 - 99 mg/dL   BUN 10 6 - 20 mg/dL   Creatinine, Ser 0.55 0.44 - 1.00 mg/dL   Calcium 9.6 8.9 - 10.3 mg/dL   Total Protein 7.0 6.5 - 8.1 g/dL   Albumin 4.0 3.5 - 5.0 g/dL   AST 22 15 - 41 U/L   ALT 18 14 - 54 U/L   Alkaline Phosphatase 97 38 - 126 U/L   Total Bilirubin 1.2 0.3 - 1.2 mg/dL   GFR calc non Af Amer >60 >60 mL/min   GFR calc Af Amer >60 >60 mL/min    Comment: (NOTE) The eGFR has been calculated using the CKD EPI equation. This calculation has not been validated in all clinical situations. eGFR's persistently <60 mL/min signify possible Chronic Kidney Disease.    Anion gap 9 5 - 15  Troponin I     Status: None   Collection Time: 04/18/16 11:13 PM  Result Value Ref Range   Troponin I <0.03 <0.03 ng/mL  Fibrin derivatives D-Dimer     Status: None   Collection Time: 04/18/16 11:13 PM  Result Value Ref Range   Fibrin derivatives D-dimer ([facility]) 497 0 - 499    Comment: <> Exclusion of Venous Thromboembolism (VTE) - OUTPATIENTS ONLY        (Emergency Department or [place])  0-499 ng/ml (FEU)  : With a low to intermediate pretest                                        probability for VTE this test result                                        excludes the diagnosis of VTE.           > 499 ng/ml (FEU)  : VTE not excluded.  Additional work up                                   for VTE is required.   <>  Testing on Inpatients and Evaluation of Disseminated Intravascular        Coagulation (DIC)             Reference  Range:   0-499 ng/ml (FEU)    Dg Chest Portable 1 View  04/19/2016  CLINICAL DATA:  Initial evaluation for acute chest pain, dyspnea. EXAM: PORTABLE CHEST 1 VIEW COMPARISON:  Prior radiograph from 03/09/2016. FINDINGS: Cardiomegaly is stable. Sequela prior CABG noted. Coronary stents noted. Vascular stent overlies the upper left mediastinum, stable. Atheromatous plaque within the aortic arch. Lungs normally inflated. No focal or trach, pulmonary edema, or pleural effusion. No pneumothorax. No acute osseus abnormality. IMPRESSION: 1. No active cardiopulmonary disease. 2. Stable cardiomegaly without pulmonary edema. 3. Sequela of prior CABG and coronary stenting. 4. Aortic atherosclerosis. Electronically Signed   By: Jeannine Boga M.D.   On: 04/19/2016 00:41    Review of Systems  Constitutional: Negative for fever and chills.  HENT: Negative for sore throat and tinnitus.   Eyes: Negative for blurred vision and redness.  Respiratory: Negative for cough and shortness of breath.   Cardiovascular: Negative for chest pain, palpitations, orthopnea and PND.  Gastrointestinal: Positive for abdominal pain. Negative for nausea, vomiting and diarrhea.  Genitourinary: Negative for dysuria, urgency and frequency.  Musculoskeletal: Negative for myalgias and joint pain.  Skin: Negative for rash.       No lesions  Neurological: Negative for speech change, focal weakness and weakness.  Endo/Heme/Allergies: Does not bruise/bleed easily.       No temperature intolerance  Psychiatric/Behavioral: Negative for depression and suicidal ideas.    Blood pressure 110/80, pulse 80, temperature 97.6 F (36.4 C), resp. rate 24, height 5' 1"  (1.549 m), weight 66.679 kg (147 lb), SpO2 96 %. Physical Exam  Vitals reviewed. Constitutional: She is oriented to person, place, and time. She appears well-developed and well-nourished. No distress.  HENT:  Head: Normocephalic and atraumatic.  Mouth/Throat: Oropharynx is  clear and moist.  Eyes: Conjunctivae and EOM are normal. Pupils are equal, round, and reactive to light. No scleral icterus.  Neck: Normal range of motion. Neck supple. No JVD present. No tracheal deviation present. No thyromegaly present.  Cardiovascular: Normal rate, regular rhythm and normal heart sounds.  Exam reveals no gallop and no friction rub.   No murmur heard. Respiratory: Effort normal and breath sounds normal.  Surgically removed sternum  GI: Soft. Bowel sounds are normal. She exhibits no distension. There is tenderness (diffusely).  Genitourinary:  Deferred   Musculoskeletal: Normal range of motion.  She exhibits no edema.  Lymphadenopathy:    She has no cervical adenopathy.  Neurological: She is alert and oriented to person, place, and time. No cranial nerve deficit. She exhibits normal muscle tone.  Skin: Skin is warm and dry. No rash noted. No erythema.  Psychiatric: She has a normal mood and affect. Her behavior is normal. Judgment and thought content normal.     Assessment/Plan This is a 54 year old female admitted for abdominal pain and 1. Abdominal pain: Acute on chronic; seems to be more associated with bloating than actual pain. This may be related to her urinary retention. I will give her 1 dose of Lasix IV to see if this relieves some of her generalized fluid retention. Abdomen is nonsurgical. Continue pain management. 2. Urinary retention: This is been ongoing since her back injury. Foley in place (changed 2 days ago) 3. Coronary artery disease: No indication of ACS at this time. Chest pain is likely musculoskeletal given the absence of the sternum to support posture. Continue to monitor telemetry. Continue Plavix and aspirin 4. Essential hypertension: Controlled; continue lisinopril 5. COPD: Without exacerbation; continue inhaled corticosteroid 5. DVT prophylaxis: Lovenox 6. GI prophylaxis: Pantoprazole per home regimen The patient is a full code. Time spent on  admission orders and patient care approximately 45 minutes  Harrie Foreman, MD 04/19/2016, 1:32 AM

## 2016-04-19 NOTE — ED Provider Notes (Signed)
[facility] Emergency Department Provider Note  ____________________________________________  Time seen: 11:07 PM  I have reviewed the triage vital signs and the nursing notes.   HISTORY  Chief Complaint Abdominal Pain     HPI Kirsten Castro is a 55 y.o. female presents with multiple medical complaints including chest pain 2 days that is currently 10 out of 10 nonradiating accompanied by dyspnea patient denies any cough or palpitations. Patient denies any diaphoresis or dizziness. Patient also admits to generalized 10 out of 10 abdominal pain which is similar to her chronic abdominal pain. Patient states that the pain is accompanied by nausea vomiting and diarrhea.  Past Medical History  Diagnosis Date  . Coronary artery disease   . COPD (chronic obstructive pulmonary disease) ([place])   . Myocardial infarction ([place])   . Anginal pain ([place])   . Hypertension   . Cancer ([place])     melanoma skin cancer  . 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  This was done at [facility]  . BP (high blood pressure) 02/03/2016  . Carotid stenosis 02/10/2015  . Peripheral vascular disease ([place]) 02/03/2016    Overview:  Follows with Dr. Delana Meyer    . Temporary cerebral vascular dysfunction 02/03/2016  . Colitis 12/15/2015  . Esophageal candidiasis ([place])   . Gastritis   . GI bleed 12/15/2015  . Ischemic colitis ([place]) 01/01/2016    Overview:   SMA mesenteric ischemia now status post angioplasty by vascular of In stent restenosis.   Overview:  S?P SMA angioplasty   . Nausea and vomiting in adult   .  Noninfectious diarrhea   . Stroke [facility])     TIA X 2  . Shortness of breath dyspnea     with exertion  . Anxiety   . Depression   . Chronic kidney disease     UTI  . GERD (gastroesophageal reflux disease)   . Headache   . Anemia     Patient Active Problem List   Diagnosis Date Noted  . Sepsis ([place]) 03/09/2016  . Urinary retention 03/09/2016  . CAD (coronary artery disease) 03/09/2016  . Frank hematuria 03/08/2016  . Temporary cerebral vascular dysfunction 02/03/2016  . Peripheral vascular disease ([place]) 02/03/2016  . BP (high blood pressure) 02/03/2016  . HLD (hyperlipidemia) 02/03/2016  . Clinical depression 02/03/2016  . Carotid artery narrowing 02/03/2016  . 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 Anxiety 02/03/2016  . Abdominal pain, generalized   . Nausea with vomiting   . Gastritis   . Esophageal candidiasis ([place])   . Noninfectious diarrhea   . Intractable pain 01/21/2016  . Upper abdominal pain   . Nausea and vomiting in adult   . Ischemic colitis ([place]) 01/01/2016  . Colitis 12/15/2015  . GI bleed 12/15/2015  . Acute inflammation of the pancreas 12/06/2015  . Vitamin B12 deficiency anemia 02/25/2015  . Absolute anemia 02/25/2015  . Carotid stenosis 02/10/2015  . Personal history of other specified conditions 11/27/2014  . Osteomyelitis ([place]) 11/27/2014  .  H/O coronary artery bypass surgery 05/18/2014    Past Surgical History  Procedure Laterality Date  . Coronary artery bypass graft    . Peripheral vascular catheterization N/A 02/10/2015    Procedure: Carotid PTA/Stent Intervention;  Surgeon: Katha Cabal, MD;  Location: [place] CV [facility];  Service: Cardiovascular;  Laterality: N/A;  . Stent placement vascular ([facility] hx)    . Peripheral vascular catheterization N/A 12/17/2015    Procedure: Visceral Angiography;  Surgeon: Katha Cabal, MD;  Location: [place] CV LAB;   Service: Cardiovascular;  Laterality: N/A;  . Peripheral vascular catheterization N/A 12/17/2015    Procedure: Visceral Artery Intervention;  Surgeon: Katha Cabal, MD;  Location: [place] [facility] LAB;  Service: Cardiovascular;  Laterality: N/A;  . Abdominal hysterectomy    . Other surgical history  Jul 11 2014    sternum removal  . Nose surgery      cancer removal  . Esophagogastroduodenoscopy (egd) with propofol N/A 01/24/2016    Procedure: ESOPHAGOGASTRODUODENOSCOPY (EGD) WITH PROPOFOL;  Surgeon: Lucilla Lame, MD;  Location: [facility] ENDOSCOPY;  Service: Endoscopy;  Laterality: N/A;  . Colonoscopy with propofol N/A 01/24/2016    Procedure: COLONOSCOPY WITH PROPOFOL;  Surgeon: Lucilla Lame, MD;  Location: [facility] ENDOSCOPY;  Service: Endoscopy;  Laterality: N/A;  . Kyphoplasty N/A 02/08/2016    Procedure: KYPHOPLASTY L1;  Surgeon: Hessie Knows, MD;  Location: [facility] ORS;  Service: Orthopedics;  Laterality: N/A;  . Breast biopsy Left 6 2017    results not in yet  . Coronary angioplasty    . Back surgery    . Kyphoplasty N/A 03/28/2016    Procedure: KYPHOPLASTY;  Surgeon: Hessie Knows, MD;  Location: [facility] ORS;  Service: Orthopedics;  Laterality: N/A;    Current Outpatient Rx  Name  Route  Sig  Dispense  Refill  . acetaminophen (TYLENOL) 500 MG tablet   Oral   Take 1,000 mg by mouth every 6 (six) hours as needed for mild pain or headache.         . ALPRAZolam (XANAX) 1 MG tablet   Oral   Take 1 mg by mouth 3 (three) times daily as needed for anxiety or sleep.          Marland Kitchen aspirin 81 MG EC tablet   Oral   Take 81 mg by mouth daily.          Marland Kitchen atorvastatin (LIPITOR) 80 MG tablet   Oral   Take 80 mg by mouth daily.         . carvedilol (COREG) 12.5 MG tablet   Oral   Take 12.5 mg by mouth 2 (two) times daily with a meal.         . cetirizine (ZYRTEC) 10 MG tablet   Oral   Take 10 mg by mouth daily. Reported on 02/08/2016         . clopidogrel (PLAVIX) 75 MG tablet   Oral    Take 75 mg by mouth daily.          . cyclobenzaprine (FLEXERIL) 10 MG tablet   Oral   Take 10 mg by mouth 3 (three) times daily as needed for muscle spasms.          . Fluticasone-Salmeterol (ADVAIR DISKUS) 250-50 MCG/DOSE AEPB   Inhalation   Inhale 1 puff into the lungs 2 (two) times daily.          Marland Kitchen lisinopril (ZESTRIL) 10 MG tablet   Oral   Take 1 tablet (  10 mg total) by mouth daily.   60 tablet   1   . Magnesium 250 MG TABS   Oral   Take 250 mg by mouth 2 (two) times daily.         . ondansetron (ZOFRAN-ODT) 4 MG disintegrating tablet   Oral   Take 4 mg by mouth every 8 (eight) hours as needed.          . pantoprazole (PROTONIX) 40 MG tablet   Oral   Take 1 tablet (40 mg total) by mouth 2 (two) times daily.   60 tablet   1   . potassium chloride (MICRO-K) 10 MEQ CR capsule   Oral   Take 10 mEq by mouth 2 (two) times daily.          Marland Kitchen senna-docusate (SENOKOT-S) 8.6-50 MG tablet   Oral   Take 1 tablet by mouth at bedtime as needed for mild constipation.         . traZODone (DESYREL) 50 MG tablet   Oral   Take 50 mg by mouth at bedtime as needed.         Marland Kitchen HYDROcodone-acetaminophen (NORCO) 5-325 MG tablet   Oral   Take 1 tablet by mouth every 6 (six) hours as needed for moderate pain. Patient not taking: Reported on 04/12/2016   20 tablet   0   . saccharomyces boulardii (FLORASTOR) 250 MG capsule   Oral   Take 1 capsule (250 mg total) by mouth 2 (two) times daily. Patient not taking: Reported on 03/28/2016   30 capsule   0   . tamsulosin (FLOMAX) 0.4 MG CAPS capsule   Oral   Take 1 capsule (0.4 mg total) by mouth daily. Patient not taking: Reported on 03/28/2016   10 capsule   0     Allergies Ferrous sulfate  Family History  Problem Relation Age of Onset  . Breast cancer Sister   . CAD Father   . CAD Brother     Social History Social History  Substance Use Topics  . Smoking status: Former Smoker -- 1.00 packs/day    Types:  Cigarettes    Quit date: 08/01/2012  . Smokeless tobacco: Never Used  . Alcohol Use: No    Review of Systems  Constitutional: Negative for fever. Eyes: Negative for visual changes. ENT: Negative for sore throat. Cardiovascular: Negative for chest pain. Respiratory: Positive for shortness of breath. Gastrointestinal: Positive for abdominal pain, vomiting and diarrhea. Genitourinary: Negative for dysuria. Musculoskeletal: Negative for back pain. Skin: Negative for rash. Neurological: Negative for headaches, focal weakness or numbness.   10-point ROS otherwise negative.  ____________________________________________   PHYSICAL EXAM:  VITAL SIGNS: ED Triage Vitals  Enc Vitals Group     BP 04/18/16 2156 140/102 mmHg     Pulse Rate 04/18/16 2156 74     Resp 04/18/16 2156 20     Temp 04/18/16 2156 97.6 F (36.4 C)     Temp src --      SpO2 04/18/16 2156 100 %     Weight 04/18/16 2156 147 lb (66.679 kg)     Height 04/18/16 2156 5\' 1"  (1.549 m)     Head Cir --      Peak Flow --      Pain Score 04/18/16 2156 10     Pain Loc --      Pain Edu? --      Excl. in [place]? --      Constitutional: Alert and  oriented. Well appearing and in no distress. Eyes: Conjunctivae are normal. PERRL. Normal extraocular movements. ENT   Head: Normocephalic and atraumatic.   Nose: No congestion/rhinnorhea.   Mouth/Throat: Mucous membranes are moist.   Neck: No stridor. Hematological/Lymphatic/Immunilogical: No cervical lymphadenopathy. Cardiovascular: Normal rate, regular rhythm. Normal and symmetric distal pulses are present in all extremities. No murmurs, rubs, or gallops. Respiratory: Normal respiratory effort without tachypnea nor retractions. Breath sounds are clear and equal bilaterally. No wheezes/rales/rhonchi. Gastrointestinal: Soft and nontender. No distention. There is no CVA tenderness. Genitourinary: deferred Musculoskeletal: Nontender with normal range of motion in  all extremities. No joint effusions.  No lower extremity tenderness nor edema. Neurologic:  Normal speech and language. No gross focal neurologic deficits are appreciated. Speech is normal.  Skin:  Skin is warm, dry and intact. No rash noted. Psychiatric: Mood and affect are normal. Speech and behavior are normal. Patient exhibits appropriate insight and judgment.  ____________________________________________    LABS (pertinent positives/negatives)  Labs Reviewed  CBC - Abnormal; Notable for the following:    RDW 15.8 (*)    All other components within normal limits  URINALYSIS COMPLETEWITH MICROSCOPIC ([facility] ONLY) - Abnormal; Notable for the following:    Color, Urine YELLOW (*)    APPearance CLEAR (*)    Hgb urine dipstick 2+ (*)    Leukocytes, UA 1+ (*)    All other components within normal limits  COMPREHENSIVE METABOLIC PANEL - Abnormal; Notable for the following:    Sodium 133 (*)    Chloride 99 (*)    Glucose, Bld 102 (*)    All other components within normal limits  URINE CULTURE  LIPASE, BLOOD  TROPONIN I  FIBRIN DERIVATIVES D-DIMER ([facility] ONLY)  BRAIN NATRIURETIC PEPTIDE  TSH  HEMOGLOBIN A1C     ____________________________________________   EKG  ED ECG REPORT I, [place] N Virlee Stroschein, the attending physician, personally viewed and interpreted this ECG.   Date: 07/19/017  EKG Time: 10:00 PM  Rate: 80  Rhythm: Normal sinus rhythm  Axis: Normal  Intervals: Normal  ST&T Change: None   ____________________________________________    RADIOLOGY  DG Chest Portable 1 View (Final result) Result time: 04/19/16 00:41:06   Final result by Rad Results In Interface (04/19/16 00:41:06)   Narrative:   CLINICAL DATA: Initial evaluation for acute chest pain, dyspnea.  EXAM: PORTABLE CHEST 1 VIEW  COMPARISON: Prior radiograph from 03/09/2016.  FINDINGS: Cardiomegaly is stable. Sequela prior CABG noted. Coronary stents noted. Vascular stent overlies the upper  left mediastinum, stable. Atheromatous plaque within the aortic arch.  Lungs normally inflated. No focal or trach, pulmonary edema, or pleural effusion. No pneumothorax.  No acute osseus abnormality.  IMPRESSION: 1. No active cardiopulmonary disease. 2. Stable cardiomegaly without pulmonary edema. 3. Sequela of prior CABG and coronary stenting. 4. Aortic atherosclerosis.   Electronically Signed By: Jeannine Boga M.D. On: 04/19/2016 00:41             Procedures      INITIAL IMPRESSION / ASSESSMENT AND PLAN / ED COURSE  Pertinent labs & imaging results that were available during my care of the patient were reviewed by me and considered in my medical decision making (see chart for details).  Given history and concern for possible cardiac etiology for the patient's chest pain given considerable risk factors patient discussed with Dr. Marcille Blanco for hospital admission for further evaluation and management  ____________________________________________   FINAL CLINICAL IMPRESSION(S) / ED DIAGNOSES  Final diagnoses:  Chest pain, unspecified chest  pain type      Gregor Hams, MD 04/20/16 2248

## 2016-04-19 NOTE — Progress Notes (Signed)
Patient placed on tele per order.

## 2016-04-19 NOTE — Progress Notes (Signed)
Patient back from CT. Complaints of seeing blood in foley. Foley intact and draining amber urine. No acute distress noted. Will continue to monitor.

## 2016-04-19 NOTE — ED Notes (Signed)
MD Diamond at bedside. 

## 2016-04-19 NOTE — Care Management Note (Signed)
Case Management Note  Patient Details  Name: Kirsten Castro MRN: [id number] Date of Birth: 1961-09-07  Subjective/Objective:                  RNCM met with patient to discuss discharge planning. She states she lives with her husband. She has a significant medical history and appeared to meet criteria for [place] with [facility]. After I met with patient she "does not feel that she needs a nurse or PT with home health". She is concerned about "fluid in her body that she feels". I explained that at discharge - if it happened again the [facility] may be able to manage at outpatient but she still declined home health. She is not currently SOB or requiring O2. Her PCP is Dr. Ola Spurr. She states her husband provides transportation. She states that she is independent with mobility.   Action/Plan:  RNCM will continue to follow. I have notified Loletha Grayer with Dr. Ola Spurr of patient being in hospital again for follow appointment.   Expected Discharge Date:                  Expected Discharge Plan:     In-House Referral:     Discharge planning Services     Post Acute Care Choice:  Home Health Choice offered to:  Patient  DME Arranged:    DME Agency:     HH Arranged:    [place] Agency:     Status of Service:  In process, will continue to follow  If discussed at Long Length of Stay Meetings, dates discussed:    Additional Comments:  Marshell Garfinkel, RN 04/19/2016, 12:41 PM

## 2016-04-19 NOTE — ED Notes (Signed)
Lab called this RN to verbalized blood has hemolyzed for the second time, MD Owens Shark made aware and verbalize not to re stick pt at this time until chest xray results.

## 2016-04-19 NOTE — Progress Notes (Signed)
[place] [facility] [place] NAME: Kirsten Castro    MR#:  [id number]  DATE OF BIRTH:  1961/04/02  SUBJECTIVE:  CHIEF COMPLAINT:   Chief Complaint  Patient presents with  . Abdominal Pain   Still complaint of abdominal and back pain, Foley cath urine leakage, requested urology consult, but per RN, no urine leakage. REVIEW OF SYSTEMS:  Review of Systems  Constitutional: Negative for fever and chills.  Respiratory: Negative for cough, sputum production, shortness of breath and wheezing.   Cardiovascular: Negative for chest pain, palpitations, orthopnea and leg swelling.  Gastrointestinal: Positive for abdominal pain. Negative for nausea, vomiting and diarrhea.  Genitourinary: Negative for dysuria and urgency.  Musculoskeletal: Positive for back pain.  Neurological: Negative for dizziness, weakness and headaches.    DRUG ALLERGIES:   Allergies  Allergen Reactions  . Ferrous Sulfate Hives   VITALS:  Blood pressure 112/54, pulse 69, temperature 98 F (36.7 C), temperature source Oral, resp. rate 18, height 5\' 1"  (1.549 m), weight 141 lb 9.6 oz (64.229 kg), SpO2 99 %. PHYSICAL EXAMINATION:  Physical Exam  Constitutional: She is oriented to person, place, and time and well-developed, well-nourished, and in no distress.  HENT:  Head: Normocephalic.  Mouth/Throat: Oropharynx is clear and moist.  Eyes: Conjunctivae and EOM are normal.  Neck: Neck supple. No JVD present. No thyromegaly present.  Cardiovascular: Normal rate, regular rhythm and normal heart sounds.   No murmur heard. Pulmonary/Chest: Effort normal and breath sounds normal. No respiratory distress. She has no wheezes. She has no rales.  Abdominal: She exhibits no distension. There is tenderness. There is no rebound and no guarding.  Musculoskeletal: Normal range of motion. She exhibits no edema or tenderness.  Neurological: She is alert and oriented to person, place, and time.    Skin: No rash noted. No erythema.   LABORATORY PANEL:   CBC  Recent Labs Lab 04/18/16 2204  WBC 8.1  HGB 15.8  HCT 45.9  PLT 273   ------------------------------------------------------------------------------------------------------------------ Chemistries   Recent Labs Lab 04/18/16 2313  NA 133*  K 4.0  CL 99*  CO2 25  GLUCOSE 102*  BUN 10  CREATININE 0.55  CALCIUM 9.6  AST 22  ALT 18  ALKPHOS 97  BILITOT 1.2   RADIOLOGY:  Dg Chest Portable 1 View  04/19/2016  CLINICAL DATA:  Initial evaluation for acute chest pain, dyspnea. EXAM: PORTABLE CHEST 1 VIEW COMPARISON:  Prior radiograph from 03/09/2016. FINDINGS: Cardiomegaly is stable. Sequela prior CABG noted. Coronary stents noted. Vascular stent overlies the upper left mediastinum, stable. Atheromatous plaque within the aortic arch. Lungs normally inflated. No focal or trach, pulmonary edema, or pleural effusion. No pneumothorax. No acute osseus abnormality. IMPRESSION: 1. No active cardiopulmonary disease. 2. Stable cardiomegaly without pulmonary edema. 3. Sequela of prior CABG and coronary stenting. 4. Aortic atherosclerosis. Electronically Signed   By: Jeannine Boga M.D.   On: 04/19/2016 00:41   ASSESSMENT AND PLAN:   This is a 55 year old female admitted for abdominal pain.  1. Abdominal pain: Acute on chronic;  Continue pain management prn.  2. Urinary retention: Foley in place (changed 2 days ago). Urology consult per pt's request.  3. Coronary artery disease: No indication of ACS at this time. Chest pain is likely musculoskeletal, Continue Plavix and aspirin.  4. Essential hypertension: Controlled; continue lisinopril. 5. COPD: Without exacerbation; continue inhaled corticosteroid  All the records are reviewed and case discussed with Care Management/Social Worker. Management plans  discussed with the patient, her husband and they are in agreement.  CODE STATUS: full code.  TOTAL TIME TAKING  CARE OF THIS PATIENT: 38 minutes.   More than 50% of the time was spent in counseling/coordination of care: YES  POSSIBLE D/C IN 1 DAYS, DEPENDING ON CLINICAL CONDITION.   Demetrios Loll M.D on 04/19/2016 at 3:24 PM  Between 7am to 6pm - Pager - [phone]  After 6pm go to [email or web address] - Technical brewer [facility]  Office  [phone]  CC: Primary care physician; FITZGERALD, DAVID Mamie Nick, MD  Note: This dictation was prepared with Dragon dictation along with smaller phrase technology. Any transcriptional errors that result from this process are unintentional.

## 2016-04-20 NOTE — Discharge Summary (Signed)
[facility] [place] NAME: Keyanah Over    MR#:  [id number]  DATE OF BIRTH:  [birth year]  DATE OF ADMISSION:  04/18/2016 ADMITTING PHYSICIAN: Harrie Foreman, MD  DATE OF DISCHARGE: 04/20/2016  1:22 PM  PRIMARY CARE PHYSICIAN: Leonel Ramsay, MD    ADMISSION DIAGNOSIS:  Urinary retention [R33.9] Chest pain, unspecified chest pain type [R07.9]  DISCHARGE DIAGNOSIS:  Active Problems:   Abdominal pain  Abdominal pain: Acute on chronic;  SECONDARY DIAGNOSIS:   Past Medical History  Diagnosis Date  . Coronary artery disease   . COPD (chronic obstructive pulmonary disease) ([place])   . Myocardial infarction ([place])   . Anginal pain ([place])   . Hypertension   . Cancer ([place])     melanoma skin cancer  . 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  This was done at [facility]  . BP (high blood pressure) 02/03/2016  . Carotid stenosis 02/10/2015  . Peripheral vascular disease ([place]) 02/03/2016    Overview:  Follows with Dr. Delana Meyer    . Temporary cerebral vascular dysfunction 02/03/2016  . Colitis 12/15/2015  . Esophageal candidiasis ([place])   . Gastritis   . GI bleed 12/15/2015  . 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   Overview:  S?P SMA angioplasty   . Nausea and vomiting in adult   . Noninfectious diarrhea   . Stroke [facility])     TIA X 2  . Shortness of breath dyspnea     with exertion  . Anxiety   . Depression   . Chronic kidney disease     UTI  .  GERD (gastroesophageal reflux disease)   . Headache   . Anemia   . Collagen vascular disease [facility])     HOSPITAL COURSE:   This is a 55 year old female admitted for abdominal pain.  1. Abdominal pain: Acute on chronic;  She is treated with pain management prn. Improved today.   2. Urinary retention: Foley in place. F/u Urologist as outpatient.  3. Coronary artery disease: No indication of ACS at this time. Chest pain is likely musculoskeletal, Continue Plavix and aspirin.  4. Essential hypertension: continue lisinopril and coreg. BP is elevated today. Pt refused to take coreg per RN.  5. COPD: Without exacerbation; continue inhaled corticosteroid  DISCHARGE CONDITIONS:   Stable, discharge to home today.  CONSULTS OBTAINED:  Treatment Team:  Hollice Espy, MD  DRUG ALLERGIES:   Allergies  Allergen Reactions  . Ferrous Sulfate Hives    DISCHARGE MEDICATIONS:   Discharge Medication List as of 04/20/2016 11:56 AM    CONTINUE these medications which have NOT CHANGED   Details  acetaminophen (TYLENOL) 500 MG tablet Take 1,000 mg by mouth every 6 (six) hours as needed for mild pain or headache., Until Discontinued, Historical Med    ALPRAZolam (XANAX) 1 MG tablet Take 1 mg by mouth 3 (three) times daily as needed for anxiety or sleep. ,  Until Discontinued, Historical Med    aspirin 81 MG EC tablet Take 81 mg by mouth daily. , Until Discontinued, Historical Med    atorvastatin (LIPITOR) 80 MG tablet Take 80 mg by mouth daily., Until Discontinued, Historical Med    carvedilol (COREG) 12.5 MG tablet Take 12.5 mg by mouth 2 (two) times daily with a meal., Until Discontinued, Historical Med    cetirizine (ZYRTEC) 10 MG tablet Take 10 mg by mouth daily. Reported on 02/08/2016, Until Discontinued, Historical Med    clopidogrel (PLAVIX) 75 MG tablet Take 75 mg by mouth daily. , Starting 02/02/2016, Until Discontinued, Historical Med    cyclobenzaprine (FLEXERIL) 10 MG tablet Take  10 mg by mouth 3 (three) times daily as needed for muscle spasms. , Until Discontinued, Historical Med    Fluticasone-Salmeterol (ADVAIR DISKUS) 250-50 MCG/DOSE AEPB Inhale 1 puff into the lungs 2 (two) times daily. , Until Discontinued, Historical Med    lisinopril (ZESTRIL) 10 MG tablet Take 1 tablet (10 mg total) by mouth daily., Starting 01/25/2016, Until Discontinued, Print    Magnesium 250 MG TABS Take 250 mg by mouth 2 (two) times daily., Until Discontinued, Historical Med    ondansetron (ZOFRAN-ODT) 4 MG disintegrating tablet Take 4 mg by mouth every 8 (eight) hours as needed. , Starting 03/01/2016, Until Discontinued, Historical Med    pantoprazole (PROTONIX) 40 MG tablet Take 1 tablet (40 mg total) by mouth 2 (two) times daily., Starting 01/25/2016, Until Discontinued, Print    potassium chloride (MICRO-K) 10 MEQ CR capsule Take 10 mEq by mouth 2 (two) times daily. , Until Discontinued, Historical Med    senna-docusate (SENOKOT-S) 8.6-50 MG tablet Take 1 tablet by mouth at bedtime as needed for mild constipation., Starting 03/11/2016, Until Discontinued, OTC    traZODone (DESYREL) 50 MG tablet Take 50 mg by mouth at bedtime as needed., Until Discontinued, Historical Med      STOP taking these medications     HYDROcodone-acetaminophen (NORCO) 5-325 MG tablet      saccharomyces boulardii (FLORASTOR) 250 MG capsule      tamsulosin (FLOMAX) 0.4 MG CAPS capsule          DISCHARGE INSTRUCTIONS:    DIET:  Heart healthy diet.  DISCHARGE CONDITION:  Stable.  ACTIVITY:  As tolerated.  DISCHARGE LOCATION:    If you experience worsening of your admission symptoms, develop shortness of breath, life threatening emergency, suicidal or homicidal thoughts you must seek medical attention immediately by calling 911 or calling your MD immediately  if symptoms less severe.  You Must read complete instructions/literature along with all the possible adverse reactions/side effects for  all the Medicines you take and that have been prescribed to you. Take any new Medicines after you have completely understood and accpet all the possible adverse reactions/side effects.   Please note  You were cared for by a hospitalist during your hospital stay. If you have any questions about your discharge medications or the care you received while you were in the hospital after you are discharged, you can call the unit and asked to speak with the hospitalist on call if the hospitalist that took care of you is not available. Once you are discharged, your primary care physician will handle any further medical issues. Please note that NO REFILLS for any discharge medications will be authorized once you are discharged, as it is imperative that you return to your primary care physician (or establish a relationship with a primary care physician if you do  not have one) for your aftercare needs so that they can reassess your need for medications and monitor your lab values.    On the day of Discharge:  VITAL SIGNS:  Blood pressure 187/80, pulse 74, temperature 97.6 F (36.4 C), temperature source Oral, resp. rate 18, height 5\' 1"  (1.549 m), weight 143 lb 4.8 oz (65 kg), SpO2 99 %.  PHYSICAL EXAMINATION:  GENERAL:  55 y.o.-year-old patient lying in the bed with no acute distress.  EYES: Pupils equal, round, reactive to light and accommodation. No scleral icterus. Extraocular muscles intact.  HEENT: Head atraumatic, normocephalic. Oropharynx and nasopharynx clear.  NECK:  Supple, no jugular venous distention. No thyroid enlargement, no tenderness.  LUNGS: Normal breath sounds bilaterally, no wheezing, rales,rhonchi or crepitation. No use of accessory muscles of respiration.  CARDIOVASCULAR: S1, S2 normal. No murmurs, rubs, or gallops.  ABDOMEN: Soft, non-tender, non-distended. Bowel sounds present. No organomegaly or mass.  EXTREMITIES: No pedal edema, cyanosis, or clubbing.  NEUROLOGIC: Cranial  nerves II through XII are intact. Muscle strength 5/5 in all extremities. Sensation intact. Gait not checked.  PSYCHIATRIC: The patient is alert and oriented x 3.  SKIN: No obvious rash, lesion, or ulcer.  DATA REVIEW:   CBC  Recent Labs Lab 04/18/16 2204  WBC 8.1  HGB 15.8  HCT 45.9  PLT 273    Chemistries   Recent Labs Lab 04/18/16 2313  NA 133*  K 4.0  CL 99*  CO2 25  GLUCOSE 102*  BUN 10  CREATININE 0.55  CALCIUM 9.6  AST 22  ALT 18  ALKPHOS 97  BILITOT 1.2    Cardiac Enzymes  Recent Labs Lab 04/18/16 2313  TROPONINI <0.03    Microbiology Results  Results for orders placed or performed during the hospital encounter of 03/24/16  Surgical pcr screen     Status: Abnormal   Collection Time: 03/24/16 11:58 AM  Result Value Ref Range Status   MRSA, PCR NEGATIVE NEGATIVE Final   Staphylococcus aureus POSITIVE (A) NEGATIVE Final    Comment:        The Xpert SA Assay (FDA approved for NASAL specimens in patients over 73 years of age), is one component of a comprehensive surveillance program.  Test performance has been validated by [facility] for patients greater than or equal to 55 year old. It is not intended to diagnose infection nor to guide or monitor treatment.     RADIOLOGY:  Ct Abdomen Pelvis W Contrast  04/19/2016  CLINICAL DATA:  Acute generalized abdominal pain with chest pain. EXAM: CT ABDOMEN AND PELVIS WITH CONTRAST TECHNIQUE: Multidetector CT imaging of the abdomen and pelvis was performed using the standard protocol following bolus administration of intravenous contrast. CONTRAST:  136mL ISOVUE-300 IOPAMIDOL (ISOVUE-300) INJECTION 61% COMPARISON:  03/09/2016, 01/20/2016 FINDINGS: Lower chest: Minor dependent basilar atelectasis, worse on the left. Normal heart size. No pericardial or pleural effusion. Small hiatal hernia noted. Lower thoracic aorta atherosclerosis noted. Hepatobiliary: No masses or other significant abnormality. Pancreas:  No mass, inflammatory changes, or other significant abnormality. Spleen: Within normal limits in size and appearance. Adrenals/Urinary Tract: Normal adrenal glands and kidneys for age. Symmetric enhancement and excretion. No ureteral obstruction or hydronephrosis. Foley catheter within the decompressed bladder. Stomach/Bowel: Marked gastric distension, nonspecific. Negative for bowel obstruction, significant dilatation, ileus, or free air. Sigmoid diverticulosis noted. No acute inflammatory process. Vascular/Lymphatic: Extensive aortic and visceral atherosclerotic disease. SMA and iliac stents present. No vascular occlusive process appreciated. No retroperitoneal hemorrhage or hematoma. No  adenopathy. Reproductive: Previous hysterectomy. No pelvic fluid or fluid collection. No adnexal abnormality. Other: No inguinal abnormality or hernia.  Intact abdominal wall. Musculoskeletal: Bones are osteopenic. Previous vertebral augmentations at L1 and L4. No new compression fracture. IMPRESSION: No acute intra-abdominal or pelvic process by CT. Extensive abdominal aortic atherosclerosis and visceral atherosclerosis. Sigmoid diverticulosis without acute inflammatory process. Electronically Signed   By: Jerilynn Mages.  Shick M.D.   On: 04/19/2016 21:43   Dg Chest Portable 1 View  04/19/2016  CLINICAL DATA:  Initial evaluation for acute chest pain, dyspnea. EXAM: PORTABLE CHEST 1 VIEW COMPARISON:  Prior radiograph from 03/09/2016. FINDINGS: Cardiomegaly is stable. Sequela prior CABG noted. Coronary stents noted. Vascular stent overlies the upper left mediastinum, stable. Atheromatous plaque within the aortic arch. Lungs normally inflated. No focal or trach, pulmonary edema, or pleural effusion. No pneumothorax. No acute osseus abnormality. IMPRESSION: 1. No active cardiopulmonary disease. 2. Stable cardiomegaly without pulmonary edema. 3. Sequela of prior CABG and coronary stenting. 4. Aortic atherosclerosis. Electronically Signed   By:  Jeannine Boga M.D.   On: 04/19/2016 00:41     Management plans discussed with the patient, family and they are in agreement.  CODE STATUS:     Code Status Orders        Start     Ordered   04/19/16 0234  Full code   Continuous     04/19/16 0233    Code Status History    Date Active Date Inactive Code Status Order ID Comments User Context   03/28/2016  2:56 PM 03/28/2016  6:56 PM Full Code [id number]  Hessie Knows, MD Inpatient   03/09/2016 11:15 PM 03/11/2016  8:17 PM Full Code [id number]  Quintella Baton, MD Inpatient   01/21/2016  3:11 AM 01/25/2016  2:59 PM Full Code [id number]  Harrie Foreman, MD Inpatient   12/15/2015 10:44 PM 12/18/2015  7:56 PM Full Code [id number]  Vaughan Basta, MD Inpatient   02/10/2015 12:02 PM 02/12/2015  7:12 PM Full Code [id number]  Algernon Huxley, MD Inpatient      TOTAL TIME TAKING CARE OF THIS PATIENT: 31 minutes.    Demetrios Loll M.D on 04/20/2016 at 2:39 PM  Between 7am to 6pm - Pager - [phone]  After 6pm go to [email or web address] - password [email or web address] [facility]  [place] [facility]  Office  [phone]  CC: Primary care physician; FITZGERALD, DAVID Mamie Nick, MD   Note: This dictation was prepared with Dragon dictation along with smaller phrase technology. Any transcriptional errors that result from this process are unintentional.

## 2016-04-20 NOTE — Progress Notes (Signed)
Paged MD r/t BP. Order to administer BP medication early. Patient and spouse refused. Made MD aware.

## 2016-04-20 NOTE — Progress Notes (Signed)
Discharge summary reviewed with patient with verbal understanding. Requested leg bag for urinary cath, change. Offered shower prior to discharge, refused.

## 2016-04-20 NOTE — Discharge Instructions (Signed)
Heart healthy diet. °Activity as tolerated. °

## 2016-04-21 ENCOUNTER — Ambulatory Visit: Payer: BLUE CROSS/BLUE SHIELD

## 2016-04-21 DIAGNOSIS — N390 Urinary tract infection, site not specified: Secondary | ICD-10-CM

## 2016-04-21 LAB — URINALYSIS, COMPLETE
BILIRUBIN UA: NEGATIVE
Glucose, UA: NEGATIVE
KETONES UA: NEGATIVE
Nitrite, UA: NEGATIVE
PH UA: 6 (ref 5.0–7.5)
Protein, UA: NEGATIVE
Specific Gravity, UA: <1.005 — ABNORMAL LOW (ref 1.005–1.030)
UUROB: 0.2 mg/dL (ref 0.2–1.0)

## 2016-04-21 LAB — MICROSCOPIC EXAMINATION: EPITHELIAL CELLS (NON RENAL): NONE SEEN /HPF (ref 0–10)

## 2016-04-21 NOTE — Progress Notes (Signed)
Pt came in today with c/o of gross hematuria. Upon triage nurse noted urine to be amber in color. Per Dr. Erlene Quan a cath specimen obtained for u/a and cx.

## 2016-04-23 LAB — URINE CULTURE: Culture: >=100000 — AB

## 2016-04-24 ENCOUNTER — Telehealth: Payer: Self-pay

## 2016-04-24 LAB — CULTURE, URINE COMPREHENSIVE

## 2016-04-24 NOTE — Telephone Encounter (Signed)
-----   Message from Hollice Espy, MD sent at 04/24/2016  7:36 AM EDT ----- No infectious organisms, UA unremarkable as well.  No need for abx.    Hollice Espy, MD

## 2016-04-24 NOTE — Telephone Encounter (Signed)
LMOM- most recent labs are negative, no need for abx.

## 2016-04-27 ENCOUNTER — Ambulatory Visit (INDEPENDENT_AMBULATORY_CARE_PROVIDER_SITE_OTHER): Payer: BLUE CROSS/BLUE SHIELD

## 2016-04-27 DIAGNOSIS — R339 Retention of urine, unspecified: Secondary | ICD-10-CM | POA: Diagnosis not present

## 2016-04-27 NOTE — Progress Notes (Signed)
Fill and Pull Catheter Removal  Patient is present today for a catheter removal.  Patient was cleaned and prepped in a sterile fashion 520ml of sterile water/ saline was instilled into the bladder when the patient felt the urge to urinate. 24ml of water was then drained from the balloon.  A 16FR foley cath was removed from the bladder no complications were noted .  Patient as then given some time to void on their own.  Patient can void  484ml on their own after some time.  Patient tolerated well.  Preformed by: Toniann Fail, LPN   Follow up/ Additional notes: Per Dr. Erlene Quan pt was offered to learn CIC. Pt denied to learn CIC. Reinforced with pt the need for CIC. Pt voiced understanding of whole conversation continuing to deny to learn CIC.

## 2016-05-02 ENCOUNTER — Telehealth: Payer: Self-pay | Admitting: Urology

## 2016-05-02 NOTE — Telephone Encounter (Signed)
She should come in for an office visit.

## 2016-05-12 ENCOUNTER — Encounter: Payer: Self-pay | Admitting: Urology

## 2016-05-12 ENCOUNTER — Ambulatory Visit (INDEPENDENT_AMBULATORY_CARE_PROVIDER_SITE_OTHER): Payer: BLUE CROSS/BLUE SHIELD | Admitting: Urology

## 2016-05-12 VITALS — BP 136/73 | HR 74 | Ht 61.0 in | Wt 144.8 lb

## 2016-05-12 DIAGNOSIS — R32 Unspecified urinary incontinence: Secondary | ICD-10-CM

## 2016-05-12 DIAGNOSIS — R159 Full incontinence of feces: Secondary | ICD-10-CM

## 2016-05-12 LAB — BLADDER SCAN AMB NON-IMAGING: SCAN RESULT: 72

## 2016-05-12 NOTE — Progress Notes (Signed)
05/12/2016 11:56 AM   Kirsten Castro [birth year] [id number]  Referring provider: Leonel Ramsay, MD [place], [place] [address]  Chief Complaint  Patient presents with  . Urinary Incontinence    "patient can't feel herself pee"    HPI: Patient is a 55 year old Caucasian female who presents today to discuss her issue of "not being able to feel herself pee."  Patient is a poor historian.  She states that she has been having this issue for awhile, but she couldn't be more specific than that time frame.  She is having no sensation of urgency. She states that she gets bloated and feels that it is time to use the restroom. She stands up and loses her urine.  She could not be specific as to how much time of lapses in between incontinence episodes.  She is not attempting to use the restroom during the day to try to empty her bladder.  She states she is drinking a lot of liquids. She states that everything she eats tastes like water. She also has had episodes of stool incontinence.    Her PVR today was 72 mL.  Her husband feels that she is had these symptoms since her back surgery in May.    She has a history of MI, TIA's and stroke.     PMH: Past Medical History:  Diagnosis Date  . Anemia   . Anginal pain ([place])   . Anxiety   . 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  This was done at [facility]  . BP (high blood pressure) 02/03/2016  . Cancer ([place])    melanoma skin cancer  . Carotid stenosis 02/10/2015  . Chronic kidney disease    UTI  . Colitis 12/15/2015  . Collagen  vascular disease ([place])   . COPD (chronic obstructive pulmonary disease) ([place])   . Coronary artery disease   . Depression   . Esophageal candidiasis ([place])   . Gastritis   . GERD (gastroesophageal reflux disease)   . GI bleed 12/15/2015  . Headache   . Hypertension   . Ischemic colitis ([place]) 01/01/2016   Overview:   SMA mesenteric ischemia now status post angioplasty by vascular of In stent restenosis.   Overview:  S?P SMA angioplasty   . Myocardial infarction ([place])   . Nausea and vomiting in adult   . Noninfectious diarrhea   . Peripheral vascular disease ([place]) 02/03/2016   Overview:  Follows with Dr. Delana Meyer    . Shortness of breath dyspnea    with exertion  . Stroke [facility])    TIA X 2  . Temporary cerebral vascular dysfunction 02/03/2016    Surgical History: Past Surgical History:  Procedure Laterality Date  . ABDOMINAL HYSTERECTOMY    . BACK SURGERY    . BREAST BIOPSY Left 6 2017   results not in yet  . COLONOSCOPY WITH PROPOFOL N/A 01/24/2016   Procedure: COLONOSCOPY WITH PROPOFOL;  Surgeon: Lucilla Lame, MD;  Location: [facility] ENDOSCOPY;  Service: Endoscopy;  Laterality: N/A;  . CORONARY ANGIOPLASTY    .  CORONARY ARTERY BYPASS GRAFT    . ESOPHAGOGASTRODUODENOSCOPY (EGD) WITH PROPOFOL N/A 01/24/2016   Procedure: ESOPHAGOGASTRODUODENOSCOPY (EGD) WITH PROPOFOL;  Surgeon: Lucilla Lame, MD;  Location: [facility] ENDOSCOPY;  Service: Endoscopy;  Laterality: N/A;  . KYPHOPLASTY N/A 02/08/2016   Procedure: KYPHOPLASTY L1;  Surgeon: Hessie Knows, MD;  Location: [facility] ORS;  Service: Orthopedics;  Laterality: N/A;  . KYPHOPLASTY N/A 03/28/2016   Procedure: KYPHOPLASTY;  Surgeon: Hessie Knows, MD;  Location: [facility] ORS;  Service: Orthopedics;  Laterality: N/A;  . NOSE SURGERY     cancer removal  . OTHER SURGICAL HISTORY  Jul 11 2014   sternum removal  . PERIPHERAL VASCULAR CATHETERIZATION N/A 02/10/2015   Procedure: Carotid PTA/Stent Intervention;  Surgeon: Katha Cabal, MD;  Location: [facility];  Service: Cardiovascular;  Laterality: N/A;  . PERIPHERAL VASCULAR CATHETERIZATION N/A 12/17/2015   Procedure: Visceral Angiography;  Surgeon: Katha Cabal, MD;  Location: [place] [facility];  Service: Cardiovascular;  Laterality: N/A;  . PERIPHERAL VASCULAR CATHETERIZATION N/A 12/17/2015   Procedure: Visceral Artery Intervention;  Surgeon: Katha Cabal, MD;  Location: [facility];  Service: Cardiovascular;  Laterality: N/A;  . STENT PLACEMENT VASCULAR ([place] [facility])      Home Medications:    Medication List       Accurate as of 05/12/16 11:56 AM. Always use your most recent med list.          acetaminophen 500 MG tablet Commonly known as:  TYLENOL Take 1,000 mg by mouth every 6 (six) hours as needed for mild pain or headache.   ADVAIR DISKUS 250-50 MCG/DOSE Aepb Generic drug:  Fluticasone-Salmeterol Inhale 1 puff into the lungs 2 (two) times daily.   ALPRAZolam 1 MG tablet Commonly known as:  XANAX Take 1 mg by mouth 3 (three) times daily as needed for anxiety or sleep.   atorvastatin 80 MG tablet Commonly known as:  LIPITOR Take 80 mg by mouth daily.   cetirizine 10 MG tablet Commonly known as:  ZYRTEC Take 10 mg by mouth daily. Reported on 02/08/2016   clopidogrel 75 MG tablet Commonly known as:  PLAVIX Take 75 mg by mouth daily.   clotrimazole 1 % cream Commonly known as:  LOTRIMIN APP EXT AA BID   clotrimazole-betamethasone cream Commonly known as:  LOTRISONE Apply topically.   cyclobenzaprine 10 MG tablet Commonly known as:  FLEXERIL Take 10 mg by mouth 3 (three) times daily as needed for muscle spasms.   furosemide 20 MG tablet Commonly known as:  LASIX Take 20 mg by mouth.   hydrocortisone 2.5 % rectal cream Commonly known as:  ANUSOL-HC Apply topically.   lisinopril 10 MG tablet Commonly known as:  ZESTRIL Take 1 tablet (10 mg total) by mouth daily.   Magnesium 250 MG Tabs Take 250 mg by mouth 2 (two) times daily.     ondansetron 4 MG disintegrating tablet Commonly known as:  ZOFRAN-ODT Take 4 mg by mouth every 8 (eight) hours as needed.   pantoprazole 40 MG tablet Commonly known as:  PROTONIX Take 1 tablet (40 mg total) by mouth 2 (two) times daily.   phenazopyridine 100 MG tablet Commonly known as:  PYRIDIUM   polyethylene glycol powder powder Commonly known as:  GLYCOLAX/MIRALAX   potassium chloride 10 MEQ CR capsule Commonly known as:  MICRO-K Take 10 mEq by mouth 2 (two) times daily.   senna-docusate 8.6-50 MG tablet Commonly known as:  Senokot-S Take 1 tablet by mouth at bedtime  as needed for mild constipation.   tamsulosin 0.4 MG Caps capsule Commonly known as:  FLOMAX TAKE ONE CAPSULE BY MOUTH DAILY WITH BREAKFAST   traZODone 50 MG tablet Commonly known as:  DESYREL Take 50 mg by mouth at bedtime as needed.       Allergies:  Allergies  Allergen Reactions  . Ferrous Sulfate Hives    Family History: Family History  Problem Relation Age of Onset  . Breast cancer Sister   . CAD Father   . CAD Brother     Social History:  reports that she quit smoking about 3 years ago. Her smoking use included Cigarettes. She smoked 1.00 pack per day. She has never used smokeless tobacco. She reports that she does not drink alcohol or use drugs.  ROS: UROLOGY Frequent Urination?: No Hard to postpone urination?: No Burning/pain with urination?: No Get up at night to urinate?: No Leakage of urine?: Yes Urine stream starts and stops?: No Trouble starting stream?: No Do you have to strain to urinate?: No Blood in urine?: No Urinary tract infection?: No Sexually transmitted disease?: No Injury to kidneys or bladder?: No Painful intercourse?: No Weak stream?: No Currently pregnant?: No Vaginal bleeding?: No Last menstrual period?: n  Gastrointestinal Nausea?: Yes Vomiting?: Yes Indigestion/heartburn?: No Diarrhea?: No Constipation?: No  Constitutional Fever: No Night  sweats?: No Weight loss?: No Fatigue?: No  Skin Skin rash/lesions?: No Itching?: No  Eyes Blurred vision?: No Double vision?: No  Ears/Nose/Throat Sore throat?: No Sinus problems?: No  Hematologic/Lymphatic Swollen glands?: No Easy bruising?: No  Cardiovascular Leg swelling?: No Chest pain?: Yes  Respiratory Cough?: No Shortness of breath?: Yes  Endocrine Excessive thirst?: No  Musculoskeletal Back pain?: No Joint pain?: No  Neurological Headaches?: Yes Dizziness?: Yes  Psychologic Depression?: Yes Anxiety?: Yes  Physical Exam: BP 136/73   Pulse 74   Ht 5\' 1"  (1.549 m)   Wt 144 lb 12.8 oz (65.7 kg)   BMI 27.36 kg/m   Constitutional: Well nourished. Alert and oriented, No acute distress. HEENT: [place] AT, moist mucus membranes. Trachea midline, no masses. Cardiovascular: No clubbing, cyanosis, or edema. Respiratory: Normal respiratory effort, no increased work of breathing. GI: Abdomen is soft, non tender, non distended, no abdominal masses. Liver and spleen not palpable.  No hernias appreciated.  Stool sample for occult testing is not indicated.   GU: No CVA tenderness.  No bladder fullness or masses.  Normal external genitalia, normal pubic hair distribution, no lesions.  Normal urethral meatus, no lesions, no prolapse, no discharge.   No urethral masses, tenderness and/or tenderness. No bladder fullness, tenderness or masses. Normal vagina mucosa, good estrogen effect, no discharge, no lesions, good pelvic support, no cystocele or rectocele noted.  Cervix and uterus are surgically absent.  No adnexal/parametria masses or tenderness noted.  Anus and perineum are without rashes or lesions.    Skin: No rashes, bruises or suspicious lesions. Lymph: No cervical or inguinal adenopathy. Neurologic: Grossly intact, no focal deficits, moving all 4 extremities. Psychiatric: Normal mood and affect.  Laboratory Data: Lab Results  Component Value Date   WBC 8.1  04/18/2016   HGB 15.8 04/18/2016   HCT 45.9 04/18/2016   MCV 93.5 04/18/2016   PLT 273 04/18/2016    Lab Results  Component Value Date   CREATININE 0.55 04/18/2016    No results found for: PSA  No results found for: TESTOSTERONE  Lab Results  Component Value Date   HGBA1C 6.8 (H) 04/19/2016  Lab Results  Component Value Date   TSH 0.798 04/19/2016    No results found for: CHOL, HDL, CHOLHDL, VLDL, LDLCALC  Lab Results  Component Value Date   AST 22 04/18/2016   Lab Results  Component Value Date   ALT 18 04/18/2016     Pertinent Imaging: Results for ZAKYLA, MCKEONE (MRN [id number]) as of 05/14/2016 20:20  Ref. Range 05/12/2016 11:38  Scan Result Unknown 72    Assessment & Plan:    1. Urinary incontinence  -  offered behavioral therapies; bladder training, bladder control strategies, pelvic floor muscle training and fluid management, but she is limited and I do not think she could engage in any of these therapies as she is unwilling to do timed voiding, she kept repeating "I can't feel myself pee" during the entire visit  - alternating between retention and incontinence, PVR low today  - offered UDS for further evaluation  - explained to the patient that urodynamics is a study that assesses how the bladder and urethra are performing their job of storing and releasing urine.  Urodynamic tests can help explain symptoms such as: incontinence, frequent urination, urgency, hesitancy, dysuria, urinary retention and/or recurrent UTI's.  To perform the test, special catheter is placed into the urethra, a rectal catheter is placed and electrodes are placed around the perineum.  The bladder is filled with water and the patient will be asked to void, it they can.  There may be a video component involved at some center, where the bladder activity is monitored by a video   2. Stool incontinence  - see above  - no parasthenia on exam  I spent 40 minutes in a face to face  conversation with the patient attempting to elicit a history, explaining why UDS would be helpful and encouraging her to try timed voiding.  Greater than 50% was spent in counseling & coordination of care with the patient.  Return for schedule UDS in [place].  These notes generated with voice recognition software. I apologize for typographical errors.  Zara Council, [facility] [address], [place],  [address] [phone]

## 2016-05-15 ENCOUNTER — Telehealth: Payer: Self-pay | Admitting: Urology

## 2016-05-15 NOTE — Telephone Encounter (Signed)
Referral to [facility] for UDS   Patient did not want to schedule his UDS on the day she was here and wanted to do it herself. I called [facility] and asked if this was possible. They said yes as long as we sent in a referral and notes. I had them fax me the referral form. I faxed the form with notes on 05-15-16. I gave the patient the NP packet the day she was here and asked her to please call me after she makes her appt in [place] so that I could make her follow up appt for the results. I will hold the referral out until I hear back from her to make sure this gets done.   michelle

## 2016-05-25 ENCOUNTER — Encounter: Payer: Self-pay | Admitting: Emergency Medicine

## 2016-05-25 ENCOUNTER — Emergency Department
Admission: EM | Admit: 2016-05-25 | Discharge: 2016-05-25 | Disposition: A | Discharge status: Home / Self Care | Payer: BLUE CROSS/BLUE SHIELD | Attending: Emergency Medicine | Admitting: Emergency Medicine

## 2016-05-25 DIAGNOSIS — J449 Chronic obstructive pulmonary disease, unspecified: Secondary | ICD-10-CM | POA: Insufficient documentation

## 2016-05-25 DIAGNOSIS — R42 Dizziness and giddiness: Secondary | ICD-10-CM | POA: Insufficient documentation

## 2016-05-25 DIAGNOSIS — Z87891 Personal history of nicotine dependence: Secondary | ICD-10-CM | POA: Diagnosis not present

## 2016-05-25 DIAGNOSIS — Z85820 Personal history of malignant melanoma of skin: Secondary | ICD-10-CM | POA: Diagnosis not present

## 2016-05-25 DIAGNOSIS — I129 Hypertensive chronic kidney disease with stage 1 through stage 4 chronic kidney disease, or unspecified chronic kidney disease: Secondary | ICD-10-CM | POA: Insufficient documentation

## 2016-05-25 DIAGNOSIS — R350 Frequency of micturition: Secondary | ICD-10-CM | POA: Diagnosis not present

## 2016-05-25 DIAGNOSIS — N189 Chronic kidney disease, unspecified: Secondary | ICD-10-CM | POA: Diagnosis not present

## 2016-05-25 DIAGNOSIS — I2510 Atherosclerotic heart disease of native coronary artery without angina pectoris: Secondary | ICD-10-CM | POA: Diagnosis not present

## 2016-05-25 DIAGNOSIS — Z79899 Other long term (current) drug therapy: Secondary | ICD-10-CM | POA: Insufficient documentation

## 2016-05-25 DIAGNOSIS — R339 Retention of urine, unspecified: Secondary | ICD-10-CM | POA: Diagnosis present

## 2016-05-25 LAB — CBC
HEMATOCRIT: 43.5 % (ref 35.0–47.0)
HEMOGLOBIN: 14.7 g/dL (ref 12.0–16.0)
MCH: 32.5 pg (ref 26.0–34.0)
MCHC: 33.8 g/dL (ref 32.0–36.0)
MCV: 96.1 fL (ref 80.0–100.0)
Platelets: 253 10*3/uL (ref 150–440)
RBC: 4.53 MIL/uL (ref 3.80–5.20)
RDW: 15.9 % — ABNORMAL HIGH (ref 11.5–14.5)
WBC: 7.4 10*3/uL (ref 3.6–11.0)

## 2016-05-25 LAB — URINALYSIS COMPLETE WITH MICROSCOPIC (ARMC ONLY)
BACTERIA UA: NONE SEEN
BILIRUBIN URINE: NEGATIVE
GLUCOSE, UA: NEGATIVE mg/dL
HGB URINE DIPSTICK: NEGATIVE
Ketones, ur: NEGATIVE mg/dL
Leukocytes, UA: NEGATIVE
NITRITE: NEGATIVE
Protein, ur: NEGATIVE mg/dL
Specific Gravity, Urine: 1.002 — ABNORMAL LOW (ref 1.005–1.030)
WBC, UA: NONE SEEN WBC/hpf (ref 0–5)
pH: 7 (ref 5.0–8.0)

## 2016-05-25 LAB — BASIC METABOLIC PANEL
ANION GAP: 11 (ref 5–15)
BUN: 10 mg/dL (ref 6–20)
CHLORIDE: 104 mmol/L (ref 101–111)
CO2: 25 mmol/L (ref 22–32)
Calcium: 9.8 mg/dL (ref 8.9–10.3)
Creatinine, Ser: 0.67 mg/dL (ref 0.44–1.00)
GFR calc non Af Amer: >60 mL/min (ref >60)
Glucose, Bld: 91 mg/dL (ref 65–99)
POTASSIUM: 4 mmol/L (ref 3.5–5.1)
Sodium: 140 mmol/L (ref 135–145)

## 2016-05-25 MED ORDER — SODIUM CHLORIDE 0.9 % IV BOLUS (SEPSIS): 1000.0000 mL | Freq: Once | INTRAVENOUS | Status: DC

## 2016-05-25 NOTE — ED Triage Notes (Signed)
Pt presents with urinary retention; states she "can't feel anything down there" and that she has been shaky and dizzy periodically in the last "little while." States that she feels like the oven is on sometimes and then she gets cold. Pt reports intermittent nausea and that she hurts in her neck when she tries to breathe (sometimes). Pt anxious and fidgety in triage.

## 2016-05-25 NOTE — ED Notes (Signed)
Post void residual 5mL. MD notified.

## 2016-05-25 NOTE — ED Notes (Signed)
Patient refusing to sign discharge paperwork electronicaly. Patient states, "I am not dry". Patient offered new brief and dry pants. New brief placed on patient. Patient states, "I dont need your damn paper work or dry pants". Patient ripped discharge paperwork out of this RN's hands and walked out of room.

## 2016-05-25 NOTE — Discharge Instructions (Signed)
Your labs here were within normal limits. Follow-up with your doctor in 2 days for further evaluation. Follow-up with your urologist for further testing. Return to the emergency room if you become incontinent of stool or urine, if you have leg numbness or weakness, she has back pain, abdominal pain, or any other symptoms concerning to you.

## 2016-05-25 NOTE — ED Provider Notes (Signed)
[facility] Emergency Department Provider Note  ____________________________________________  Time seen: Approximately 7:16 PM  I have reviewed the triage vital signs and the nursing notes.   HISTORY  Chief Complaint Urinary Retention and Weakness   HPI Kirsten Castro is a 55 y.o. 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 Patient is an extremely poor historian. She reports that for the last 6 months to a year she has had no sensation in her groin area. She reports that she can't feel when she needs to urinate. She was seen by urology on 05/12/16 where they offered behavioral therapies and also urodynamic studies.  Patient has not have a chance to schedule an appointment with them. She has had CT a/p and MRI of the lumbar spine with no acute findings. She reports that for many years now everything she eats tates like water "I eat a hamburger and it tastes like water". She denies back pain, weakness or numbness of her b/l LE. She does endorse lightheadedness and feels like she is going to pass out sometimes but it is unable to tell me how long this has been going on or what triggers these episodes.   Past Medical History:  Diagnosis Date  . Anemia   . Anginal pain ([place])   . Anxiety   . 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  This was done at [facility]  . BP (high blood pressure) 02/03/2016  . Cancer ([place])    melanoma skin cancer  . Carotid  stenosis 02/10/2015  . Chronic kidney disease    UTI  . Colitis 12/15/2015  . Collagen vascular disease ([place])   . COPD (chronic obstructive pulmonary disease) ([place])   . Coronary artery disease   . Depression   . Esophageal candidiasis ([place])   . Gastritis   . GERD (gastroesophageal reflux disease)   . GI bleed 12/15/2015  . Headache   . Hypertension   . Ischemic colitis ([place]) 01/01/2016   Overview:   SMA mesenteric ischemia now status post angioplasty by vascular of In stent restenosis.   Overview:  S?P SMA angioplasty   . Myocardial infarction ([place])   . Nausea and vomiting in adult   . Noninfectious diarrhea   . Peripheral vascular disease ([place]) 02/03/2016   Overview:  Follows with Dr. Delana Meyer    . Shortness of breath dyspnea    with exertion  . Stroke [facility])    TIA X 2  . Temporary cerebral vascular dysfunction 02/03/2016    Patient Active Problem List   Diagnosis Date Noted  . Abdominal pain 04/19/2016  . Sepsis ([place]) 03/09/2016  . Urinary retention 03/09/2016  . CAD (coronary artery disease) 03/09/2016  . Frank hematuria 03/08/2016  . Temporary cerebral vascular dysfunction 02/03/2016  . Peripheral vascular disease ([place]) 02/03/2016  . BP (high blood pressure) 02/03/2016  . HLD (hyperlipidemia) 02/03/2016  . Clinical depression 02/03/2016  . Carotid artery narrowing 02/03/2016  . Arteriosclerosis of bypass  graft of coronary artery 02/03/2016  . 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 Anxiety 02/03/2016  . Abdominal pain, generalized   . Nausea with vomiting   . Gastritis   . Esophageal candidiasis ([place])   . Noninfectious diarrhea   . Intractable pain 01/21/2016  . Upper abdominal pain   . Nausea and vomiting in adult   . Ischemic colitis ([place]) 01/01/2016  . Colitis 12/15/2015  . GI bleed 12/15/2015  . Acute inflammation of the pancreas 12/06/2015  . Vitamin B12 deficiency anemia 02/25/2015  . Absolute anemia 02/25/2015  . Carotid stenosis 02/10/2015  .  Personal history of other specified conditions 11/27/2014  . Osteomyelitis ([place]) 11/27/2014  . H/O coronary artery bypass surgery 05/18/2014    Past Surgical History:  Procedure Laterality Date  . ABDOMINAL HYSTERECTOMY    . BACK SURGERY    . BREAST BIOPSY Left 6 2017   results not in yet  . COLONOSCOPY WITH PROPOFOL N/A 01/24/2016   Procedure: COLONOSCOPY WITH PROPOFOL;  Surgeon: Lucilla Lame, MD;  Location: [facility] ENDOSCOPY;  Service: Endoscopy;  Laterality: N/A;  . CORONARY ANGIOPLASTY    . CORONARY ARTERY BYPASS GRAFT    . ESOPHAGOGASTRODUODENOSCOPY (EGD) WITH PROPOFOL N/A 01/24/2016   Procedure: ESOPHAGOGASTRODUODENOSCOPY (EGD) WITH PROPOFOL;  Surgeon: Lucilla Lame, MD;  Location: [facility] ENDOSCOPY;  Service: Endoscopy;  Laterality: N/A;  . KYPHOPLASTY N/A 02/08/2016   Procedure: KYPHOPLASTY L1;  Surgeon: Hessie Knows, MD;  Location: [facility] ORS;  Service: Orthopedics;  Laterality: N/A;  . KYPHOPLASTY N/A 03/28/2016   Procedure: KYPHOPLASTY;  Surgeon: Hessie Knows, MD;  Location: [facility] ORS;  Service: Orthopedics;  Laterality: N/A;  . NOSE SURGERY     cancer removal  . OTHER SURGICAL HISTORY  Jul 11 2014   sternum removal  . PERIPHERAL VASCULAR CATHETERIZATION N/A 02/10/2015   Procedure: Carotid PTA/Stent Intervention;  Surgeon: Katha Cabal, MD;  Location: [place] CV [facility];  Service: Cardiovascular;  Laterality: N/A;  . PERIPHERAL VASCULAR CATHETERIZATION N/A 12/17/2015   Procedure: Visceral Angiography;  Surgeon: Katha Cabal, MD;  Location: [place] CV [facility];  Service: Cardiovascular;  Laterality: N/A;  . PERIPHERAL VASCULAR CATHETERIZATION N/A 12/17/2015   Procedure: Visceral Artery Intervention;  Surgeon: Katha Cabal, MD;  Location: [place] [facility];  Service: Cardiovascular;  Laterality: N/A;  . STENT PLACEMENT VASCULAR ([place] [facility])      Prior to Admission medications   Medication Sig Start Date End Date Taking? Authorizing Provider  acetaminophen (TYLENOL) 500 MG  tablet Take 1,000 mg by mouth every 6 (six) hours as needed for mild pain or headache.    Historical Provider, MD  ALPRAZolam Duanne Moron) 1 MG tablet Take 1 mg by mouth 3 (three) times daily as needed for anxiety or sleep.     Historical Provider, MD  atorvastatin (LIPITOR) 80 MG tablet Take 80 mg by mouth daily.    Historical Provider, MD  cetirizine (ZYRTEC) 10 MG tablet Take 10 mg by mouth daily. Reported on 02/08/2016    Historical Provider, MD  clopidogrel (PLAVIX) 75 MG tablet Take 75 mg by mouth daily.  02/02/16   Historical Provider, MD  clotrimazole (LOTRIMIN) 1 % cream APP EXT AA BID 12/02/15   Historical Provider, MD  clotrimazole-betamethasone (LOTRISONE) cream Apply topically.    Historical Provider, MD  cyclobenzaprine (FLEXERIL) 10 MG tablet Take 10 mg by mouth 3 (three) times daily as needed for muscle spasms.     Historical Provider, MD  Fluticasone-Salmeterol (ADVAIR DISKUS) 250-50 MCG/DOSE AEPB  Inhale 1 puff into the lungs 2 (two) times daily.     Historical Provider, MD  furosemide (LASIX) 20 MG tablet Take 20 mg by mouth.    Historical Provider, MD  hydrocortisone (ANUSOL-HC) 2.5 % rectal cream Apply topically.    Historical Provider, MD  lisinopril (ZESTRIL) 10 MG tablet Take 1 tablet (10 mg total) by mouth daily. 01/25/16   Henreitta Leber, MD  Magnesium 250 MG TABS Take 250 mg by mouth 2 (two) times daily.    Historical Provider, MD  ondansetron (ZOFRAN-ODT) 4 MG disintegrating tablet Take 4 mg by mouth every 8 (eight) hours as needed.  03/01/16   Historical Provider, MD  pantoprazole (PROTONIX) 40 MG tablet Take 1 tablet (40 mg total) by mouth 2 (two) times daily. 01/25/16   Henreitta Leber, MD  phenazopyridine (PYRIDIUM) 100 MG tablet  12/19/13   Historical Provider, MD  polyethylene glycol powder (GLYCOLAX/MIRALAX) powder  01/09/14   Historical Provider, MD  potassium chloride (MICRO-K) 10 MEQ CR capsule Take 10 mEq by mouth 2 (two) times daily.     Historical Provider, MD    senna-docusate (SENOKOT-S) 8.6-50 MG tablet Take 1 tablet by mouth at bedtime as needed for mild constipation. 03/11/16   Nicholes Mango, MD  tamsulosin (FLOMAX) 0.4 MG CAPS capsule TAKE ONE CAPSULE BY MOUTH DAILY WITH BREAKFAST 07/13/14   Historical Provider, MD  traZODone (DESYREL) 50 MG tablet Take 50 mg by mouth at bedtime as needed.    Historical Provider, MD    Allergies Ferrous sulfate  Family History  Problem Relation Age of Onset  . Breast cancer Sister   . CAD Father   . CAD Brother     Social History Social History  Substance Use Topics  . Smoking status: Former Smoker    Packs/day: 1.00    Types: Cigarettes    Quit date: 08/01/2012  . Smokeless tobacco: Never Used  . Alcohol use No    Review of Systems  Constitutional: Negative for fever. + Lightheadedness Eyes: Negative for visual changes. ENT: Negative for sore throat. Cardiovascular: Negative for chest pain. Respiratory: Negative for shortness of breath. Gastrointestinal: Negative for abdominal pain, vomiting or diarrhea. Genitourinary: Negative for dysuria. + retention Musculoskeletal: Negative for back pain. Skin: Negative for rash. Neurological: Negative for headaches, weakness or numbness.  ____________________________________________   PHYSICAL EXAM:  VITAL SIGNS: ED Triage Vitals  Enc Vitals Group     BP 05/25/16 1755 (!) 160/95     Pulse Rate 05/25/16 1755 82     Resp --      Temp 05/25/16 1755 98.2 F (36.8 C)     Temp Source 05/25/16 1755 Oral     SpO2 05/25/16 1755 99 %     Weight 05/25/16 1755 144 lb (65.3 kg)     Height 05/25/16 1755 5\' 1"  (1.549 m)     Head Circumference --      Peak Flow --      Pain Score 05/25/16 1756 8     Pain Loc --      Pain Edu? --      Excl. in [place]? --     Constitutional: Alert and oriented. Well appearing and in no apparent distress. HEENT:      Head: Normocephalic and atraumatic.         Eyes: Conjunctivae are normal. Sclera is non-icteric. EOMI.  PERRL      Mouth/Throat: Mucous membranes are moist.       Neck: Supple  with no signs of meningismus. Cardiovascular: Regular rate and rhythm. No murmurs, gallops, or rubs. 2+ symmetrical distal pulses are present in all extremities. No JVD. Respiratory: Normal respiratory effort. Lungs are clear to auscultation bilaterally. No wheezes, crackles, or rhonchi.  Gastrointestinal: Soft, non tender, and non distended with positive bowel sounds. No rebound or guarding. Genitourinary: No CVA tenderness. Musculoskeletal: ttp over the bilateral paraspinal regions of lumbar spine. Nontender with normal range of motion in all extremities. No edema, cyanosis, or erythema of extremities. Neurologic: Normal speech and language. A & O x3, PERRL, no nystagmus, CN II-XII intact, motor testing reveals good tone and bulk throughout. There is no evidence of pronator drift or dysmetria. Muscle strength is 5/5 throughout. Deep tendon reflexes are 2+ throughout with downgoing toes. PVR 17cc. Sensory examination is intact. Gait is normal. Skin: Skin is warm, dry and intact. No rash noted. Psychiatric: Mood and affect are normal. Speech and behavior are normal.  ____________________________________________   LABS (all labs ordered are listed, but only abnormal results are displayed)  Labs Reviewed  URINALYSIS COMPLETEWITH MICROSCOPIC ([facility] ONLY) - Abnormal; Notable for the following:       Result Value   Color, Urine STRAW (*)    APPearance CLEAR (*)    Specific Gravity, Urine 1.002 (*)    Squamous Epithelial / LPF 0-5 (*)    All other components within normal limits  CBC - Abnormal; Notable for the following:    RDW 15.9 (*)    All other components within normal limits  BASIC METABOLIC PANEL   ____________________________________________  EKG  ED ECG REPORT I, Rudene Re, the attending physician, personally viewed and interpreted this ECG.  Normal sinus rhythm, rate of 81, normal intervals,  normal axis, no ST elevations or depressions. ____________________________________________  RADIOLOGY  none  ____________________________________________   PROCEDURES  Procedure(s) performed: None Procedures Critical Care performed:  None ____________________________________________   INITIAL IMPRESSION / ASSESSMENT AND PLAN / ED COURSE  55 y.o. 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 All of these symptoms have been ongoing for 6-12 months. Patient has been seen by urology and has had CT abdomen and pelvis and MRI of her lumbar spine. MRI with no evidence of cauda equina in June 2017. 2 CTs one in June and one in July 2017, both are negative including patent SMA and iliac stents. Patient's neurological exam here is normal with PVR 17 cc, normal rectal tone, normal 2+ reflexes on bilateral lower extremities, normal strength and sensation of bilateral lower extremities. Patient has no back pain. Since there has been no changes in her symptoms for over 6 months and patient has had all this imaging that is negative I don't believe any further imaging at this time will reveal any pathology. Patient has no evidence of cauda equina on examination. Her labs are within normal limits and UA is negative for urinary tract infection. We'll refer patient back to her primary care doctor for further evaluation of her symptoms and encouraged patient to follow-up with urology for further testing that was recommended.   Clinical Course    Pertinent labs & imaging results that were available during my care of the patient were reviewed by me and considered in my medical decision making (see chart for  details).    ____________________________________________   FINAL CLINICAL IMPRESSION(S) / ED DIAGNOSES  Final diagnoses:  Frequency of urination  Lightheadedness      NEW MEDICATIONS STARTED DURING THIS VISIT:  New Prescriptions   No medications on file     Note:  This document was prepared using Dragon voice recognition software and may include unintentional dictation errors.    Rudene Re, MD 05/25/16 2040

## 2016-06-27 ENCOUNTER — Other Ambulatory Visit: Payer: Self-pay | Admitting: Internal Medicine

## 2016-06-27 ENCOUNTER — Telehealth: Payer: Self-pay

## 2016-06-27 ENCOUNTER — Other Ambulatory Visit: Payer: Self-pay | Admitting: Infectious Diseases

## 2016-06-27 DIAGNOSIS — I25119 Atherosclerotic heart disease of native coronary artery with unspecified angina pectoris: Secondary | ICD-10-CM

## 2016-06-27 DIAGNOSIS — R140 Abdominal distension (gaseous): Secondary | ICD-10-CM

## 2016-06-27 NOTE — Telephone Encounter (Signed)
Patient called stating that she was full of fluid and having a blocked bladder.  Patient was asked to come to the office for evaluation. Patient states that I am not understanding her and that she is too full of fluid. It was expressed to the patient the urgency to come to the office for possible cath. Patient was not willing to agree to this and continued to just keep repeating "I am too full of fluid, my bladder is blocked". It was again expressed to the patient to please come to the office the patient then said again "I'm too full" and hung up.

## 2016-07-03 ENCOUNTER — Encounter (INDEPENDENT_AMBULATORY_CARE_PROVIDER_SITE_OTHER): Payer: BLUE CROSS/BLUE SHIELD

## 2016-07-03 ENCOUNTER — Ambulatory Visit (INDEPENDENT_AMBULATORY_CARE_PROVIDER_SITE_OTHER): Payer: BLUE CROSS/BLUE SHIELD | Admitting: Vascular Surgery

## 2016-07-21 ENCOUNTER — Encounter: Payer: Self-pay | Admitting: Radiology

## 2016-07-21 ENCOUNTER — Emergency Department
Admission: EM | Admit: 2016-07-21 | Discharge: 2016-07-21 | Disposition: A | Discharge status: Home / Self Care | Payer: BLUE CROSS/BLUE SHIELD | Attending: Emergency Medicine | Admitting: Emergency Medicine

## 2016-07-21 ENCOUNTER — Emergency Department: Payer: BLUE CROSS/BLUE SHIELD

## 2016-07-21 DIAGNOSIS — Z87891 Personal history of nicotine dependence: Secondary | ICD-10-CM | POA: Insufficient documentation

## 2016-07-21 DIAGNOSIS — I252 Old myocardial infarction: Secondary | ICD-10-CM | POA: Insufficient documentation

## 2016-07-21 DIAGNOSIS — N189 Chronic kidney disease, unspecified: Secondary | ICD-10-CM | POA: Diagnosis not present

## 2016-07-21 DIAGNOSIS — R079 Chest pain, unspecified: Secondary | ICD-10-CM | POA: Diagnosis present

## 2016-07-21 DIAGNOSIS — Z85828 Personal history of other malignant neoplasm of skin: Secondary | ICD-10-CM | POA: Insufficient documentation

## 2016-07-21 DIAGNOSIS — Z79899 Other long term (current) drug therapy: Secondary | ICD-10-CM | POA: Insufficient documentation

## 2016-07-21 DIAGNOSIS — I129 Hypertensive chronic kidney disease with stage 1 through stage 4 chronic kidney disease, or unspecified chronic kidney disease: Secondary | ICD-10-CM | POA: Diagnosis not present

## 2016-07-21 LAB — CBC
HCT: 43.5 % (ref 35.0–47.0)
Hemoglobin: 14.8 g/dL (ref 12.0–16.0)
MCH: 34 pg (ref 26.0–34.0)
MCHC: 34 g/dL (ref 32.0–36.0)
MCV: 100.1 fL — AB (ref 80.0–100.0)
PLATELETS: 174 10*3/uL (ref 150–440)
RBC: 4.35 MIL/uL (ref 3.80–5.20)
RDW: 15.1 % — ABNORMAL HIGH (ref 11.5–14.5)
WBC: 5.8 10*3/uL (ref 3.6–11.0)

## 2016-07-21 LAB — BASIC METABOLIC PANEL
Anion gap: 7 (ref 5–15)
BUN: 18 mg/dL (ref 6–20)
CHLORIDE: 109 mmol/L (ref 101–111)
CO2: 26 mmol/L (ref 22–32)
CREATININE: 0.61 mg/dL (ref 0.44–1.00)
Calcium: 9.4 mg/dL (ref 8.9–10.3)
Glucose, Bld: 113 mg/dL — ABNORMAL HIGH (ref 65–99)
POTASSIUM: 3.4 mmol/L — AB (ref 3.5–5.1)
SODIUM: 142 mmol/L (ref 135–145)

## 2016-07-21 LAB — TROPONIN I: Troponin I: <0.03 ng/mL (ref <0.03)

## 2016-07-21 MED ORDER — MORPHINE SULFATE (PF) 2 MG/ML IV SOLN
INTRAVENOUS | Status: AC
  Administered 2016-07-21: 2 mg via INTRAVENOUS
  Filled 2016-07-21: qty 1

## 2016-07-21 MED ORDER — ONDANSETRON HCL 4 MG/2ML IJ SOLN
INTRAMUSCULAR | Status: AC
  Administered 2016-07-21: 4 mg via INTRAVENOUS
  Filled 2016-07-21: qty 2

## 2016-07-21 MED ORDER — MORPHINE SULFATE (PF) 2 MG/ML IV SOLN
2.0000 mg | Freq: Once | INTRAVENOUS | Status: AC
  Administered 2016-07-21: 2 mg via INTRAVENOUS

## 2016-07-21 MED ORDER — IOPAMIDOL (ISOVUE-370) INJECTION 76%
100.0000 mL | Freq: Once | INTRAVENOUS | Status: AC | PRN
  Administered 2016-07-21: 100 mL via INTRAVENOUS

## 2016-07-21 MED ORDER — ONDANSETRON HCL 4 MG/2ML IJ SOLN
4.0000 mg | Freq: Once | INTRAMUSCULAR | Status: AC
  Administered 2016-07-21: 4 mg via INTRAVENOUS

## 2016-07-21 MED ORDER — ONDANSETRON HCL 4 MG/2ML IJ SOLN
4.0000 mg | Freq: Once | INTRAMUSCULAR | Status: AC
  Administered 2016-07-21: 4 mg via INTRAVENOUS
  Filled 2016-07-21: qty 2

## 2016-07-21 NOTE — ED Notes (Signed)
MD at bedside. 

## 2016-07-21 NOTE — Discharge Instructions (Signed)

## 2016-07-21 NOTE — ED Triage Notes (Signed)
Patient ambulatory to triage with steady gait, without difficulty or distress noted; pt reports mid CP radiating into back since last night accomp by nausea

## 2016-07-21 NOTE — ED Provider Notes (Signed)
Received patient in signout at 7 AM.  Ct Angio Chest Pe W And/or Wo Contrast  Result Date: 07/21/2016 CLINICAL DATA:  Chest pain radiating into the back. EXAM: CT ANGIOGRAPHY CHEST WITH CONTRAST TECHNIQUE: Multidetector CT imaging of the chest was performed using the standard protocol during bolus administration of intravenous contrast. Multiplanar CT image reconstructions and MIPs were obtained to evaluate the vascular anatomy. CONTRAST:  100 mL Isovue 370 COMPARISON:  01/20/2016 and 04/19/2016 FINDINGS: Cardiovascular: Satisfactory opacification of the pulmonary arteries. No evidence for pulmonary embolism. Coronary artery calcifications and evidence of prior CABG procedure. Vascular stent at the origin of the left subclavian artery. The aorta and great vessels are not well opacified on this examination. Normal caliber of the thoracic aorta without aneurysm. Hepatic veins are enlarged with reflux into the hepatic veins. Findings suggest increased right heart pressures. Right atrium is also large for size. There is a stent in the SMA. Mediastinum/Nodes: Mild thickening in the distal esophagus has minimally changed and nonspecific. There is no significant mediastinal, hilar or axillary lymphadenopathy. No significant pericardial fluid. Lungs/Pleura: No pleural effusions. The trachea and mainstem bronchi are patent. Mild emphysematous changes in the upper lungs. Few densities in the dependent aspect of the lungs could represent atelectasis. There is no significant airspace disease or consolidation. Scattered small pole bulla. Largest bulla in the left lower lobe measuring up to 1.4 cm. Upper Abdomen: Markedly enlarged hepatic veins. No acute abnormality in the upper abdomen. Musculoskeletal: The sternum has been removed. There is a compression fracture at T12 with bone cement. No new compression fractures in thoracic spine. Review of the MIP images confirms the above findings. IMPRESSION: Negative for pulmonary  embolism. No acute chest abnormality. Mild thickening of the esophagus is nonspecific. Minimal change from the previous abdominal CT. Esophagitis cannot be excluded. Mild emphysematous changes. Enlarged hepatic veins with contrast refluxing into the liver. Findings are suggestive for increased right heart pressures. Post CABG procedure with resection of the sternum. Electronically Signed   By: Markus Daft M.D.   On: 07/21/2016 07:11   Dg Chest Port 1 View  Result Date: 07/21/2016 CLINICAL DATA:  Initial evaluation for acute mid chest pain radiating to back. EXAM: PORTABLE CHEST 1 VIEW COMPARISON:  Prior radiograph from 10/1 04/19/2016. FINDINGS: Cardiomegaly with sequela of prior CABG, stable. Mediastinal silhouette within normal limits. Aortic atherosclerosis noted. Vascular stent overlies the upper mediastinum. Lungs normally inflated. No focal infiltrate, pulmonary edema, or pleural effusion. No pneumothorax. No acute osseus abnormality. IMPRESSION: 1. No active cardiopulmonary disease. 2. Stable cardiomegaly with sequela of prior CABG. Electronically Signed   By: Jeannine Boga M.D.   On: 07/21/2016 06:09     Patient reports the present time she does feel slightly nauseated. No further chest pressure. She doesn't extensive cardiac history, and I have paged Dr. Ubaldo Glassing to discuss her case and obtain recommendations.  Plan as signed out by Dr. Owens Shark is to discharge the patient if her second troponin is normal with her to follow up with cardiology.  ----------------------------------------- 9:39 AM on 07/21/2016 -----------------------------------------  Reviewed the patient's case, clinical history with Dr. Nehemiah Massed of cardiology closely. He advises discharging the patient, and review of the patient's records and a quite close follow-up she is currently on aspirin and Plavix. Presently no pain, she did report she feels just slightly nauseated because she is hungry and would like to  eat.  Careful return precautions advised. Patient's family notes that they will call and schedule follow-up clinic visit  this afternoon.  Return precautions and treatment recommendations and follow-up discussed with the patient who is agreeable with the plan.    Delman Kitten, MD 07/21/16 [phone]

## 2016-07-21 NOTE — ED Notes (Signed)
Pt given written and verbal discharge instructions. E-signature pad not working, pt verbalized understanding.

## 2016-07-21 NOTE — ED Notes (Signed)
Patient transported to CT 

## 2016-07-21 NOTE — ED Notes (Signed)
Pt reports to MD that bladder full of fluid pt states has nerve damage causing her to not know when she empties bladder. Pt continues to try to lie on side pulling on abd stating fluids is here, back and chest. Explained to pt bladder only has 42 ml in it. Pt doesn't understand. Dr. Owens Shark made aware to explain and talk to pt in regards to her concern of build up of fluid. Abd soft no distention.

## 2016-07-21 NOTE — ED Provider Notes (Signed)
[facility] Emergency Department Provider Note    First MD Initiated Contact with Patient 07/21/16 [phone]     (approximate)  I have reviewed the triage vital signs and the nursing notes.   HISTORY  Chief Complaint Chest Pain   HPI CHANDLAR ECKELBERRY is a 55 y.o. female presents with CAD sp CABG complicated by chest infection. Patient presents today with 3 day history of central chest pain with radiation to the left side of her chest. Patient also admits to dyspnea. Patient denies any diaphoresis no nausea or vomiting. Patient states that pain today is consistent with previous episodes of chest pain.   Past Medical History:  Diagnosis Date  . Anemia   . Anginal pain ([place])   . Anxiety   . 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  This was done at [facility]  . BP (high blood pressure) 02/03/2016  . Cancer ([place])    melanoma skin cancer  . Carotid stenosis 02/10/2015  . Chronic kidney disease    UTI  . Colitis 12/15/2015  . Collagen vascular disease ([place])   . COPD (chronic obstructive pulmonary disease) ([place])   . Coronary artery disease   . Depression   . Esophageal candidiasis ([place])   . Gastritis   . GERD (gastroesophageal reflux disease)   . GI bleed 12/15/2015  . Headache   . Hypertension   . Ischemic colitis ([place]) 01/01/2016   Overview:   SMA mesenteric ischemia now status post angioplasty by vascular of In stent restenosis.   Overview:  S?P SMA angioplasty   . Myocardial infarction   . Nausea and vomiting in adult   . Noninfectious diarrhea   . Peripheral vascular  disease ([place]) 02/03/2016   Overview:  Follows with Dr. Delana Meyer    . Shortness of breath dyspnea    with exertion  . Stroke [facility])    TIA X 2  . Temporary cerebral vascular dysfunction 02/03/2016    Patient Active Problem List   Diagnosis Date Noted  . Abdominal pain 04/19/2016  . Sepsis ([place]) 03/09/2016  . Urinary retention 03/09/2016  . CAD (coronary artery disease) 03/09/2016  . Frank hematuria 03/08/2016  . Temporary cerebral vascular dysfunction 02/03/2016  . Peripheral vascular disease ([place]) 02/03/2016  . BP (high blood pressure) 02/03/2016  . HLD (hyperlipidemia) 02/03/2016  . Clinical depression 02/03/2016  . Carotid artery narrowing 02/03/2016  . 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 Anxiety 02/03/2016  . Abdominal pain, generalized   . Nausea with vomiting   . Gastritis   . Esophageal candidiasis ([place])   . Noninfectious diarrhea   . Intractable pain 01/21/2016  . Upper abdominal pain   . Nausea and vomiting in adult   . Ischemic colitis ([place]) 01/01/2016  . Colitis 12/15/2015  . GI bleed 12/15/2015  . Acute inflammation of the pancreas 12/06/2015  . Vitamin B12 deficiency anemia 02/25/2015  . Absolute anemia 02/25/2015  . Carotid stenosis 02/10/2015  . Personal history of other specified conditions 11/27/2014  .  Osteomyelitis ([place]) 11/27/2014  . H/O coronary artery bypass surgery 05/18/2014    Past Surgical History:  Procedure Laterality Date  . ABDOMINAL HYSTERECTOMY    . BACK SURGERY    . BREAST BIOPSY Left 6 2017   results not in yet  . COLONOSCOPY WITH PROPOFOL N/A 01/24/2016   Procedure: COLONOSCOPY WITH PROPOFOL;  Surgeon: Lucilla Lame, MD;  Location: [facility] ENDOSCOPY;  Service: Endoscopy;  Laterality: N/A;  . CORONARY ANGIOPLASTY    . CORONARY ARTERY BYPASS GRAFT    . ESOPHAGOGASTRODUODENOSCOPY (EGD) WITH PROPOFOL N/A 01/24/2016   Procedure: ESOPHAGOGASTRODUODENOSCOPY (EGD) WITH  PROPOFOL;  Surgeon: Lucilla Lame, MD;  Location: [facility] ENDOSCOPY;  Service: Endoscopy;  Laterality: N/A;  . KYPHOPLASTY N/A 02/08/2016   Procedure: KYPHOPLASTY L1;  Surgeon: Hessie Knows, MD;  Location: [facility] ORS;  Service: Orthopedics;  Laterality: N/A;  . KYPHOPLASTY N/A 03/28/2016   Procedure: KYPHOPLASTY;  Surgeon: Hessie Knows, MD;  Location: [facility] ORS;  Service: Orthopedics;  Laterality: N/A;  . NOSE SURGERY     cancer removal  . OTHER SURGICAL HISTORY  Jul 11 2014   sternum removal  . PERIPHERAL VASCULAR CATHETERIZATION N/A 02/10/2015   Procedure: Carotid PTA/Stent Intervention;  Surgeon: Katha Cabal, MD;  Location: [place] [facility];  Service: Cardiovascular;  Laterality: N/A;  . PERIPHERAL VASCULAR CATHETERIZATION N/A 12/17/2015   Procedure: Visceral Angiography;  Surgeon: Katha Cabal, MD;  Location: [place] [facility];  Service: Cardiovascular;  Laterality: N/A;  . PERIPHERAL VASCULAR CATHETERIZATION N/A 12/17/2015   Procedure: Visceral Artery Intervention;  Surgeon: Katha Cabal, MD;  Location: [place] [facility];  Service: Cardiovascular;  Laterality: N/A;  . STENT PLACEMENT VASCULAR ([place] HX)      Prior to Admission medications   Medication Sig Start Date End Date Taking? Authorizing Provider  acetaminophen (TYLENOL) 500 MG tablet Take 1,000 mg by mouth every 6 (six) hours as needed for mild pain or headache.    Historical Provider, MD  ALPRAZolam Duanne Moron) 1 MG tablet Take 1 mg by mouth 3 (three) times daily as needed for anxiety or sleep.     Historical Provider, MD  atorvastatin (LIPITOR) 80 MG tablet Take 80 mg by mouth daily.    Historical Provider, MD  cetirizine (ZYRTEC) 10 MG tablet Take 10 mg by mouth daily. Reported on 02/08/2016    Historical Provider, MD  clopidogrel (PLAVIX) 75 MG tablet Take 75 mg by mouth daily.  02/02/16   Historical Provider, MD  clotrimazole (LOTRIMIN) 1 % cream APP EXT AA BID 12/02/15   Historical Provider, MD    clotrimazole-betamethasone (LOTRISONE) cream Apply topically.    Historical Provider, MD  cyclobenzaprine (FLEXERIL) 10 MG tablet Take 10 mg by mouth 3 (three) times daily as needed for muscle spasms.     Historical Provider, MD  Fluticasone-Salmeterol (ADVAIR DISKUS) 250-50 MCG/DOSE AEPB Inhale 1 puff into the lungs 2 (two) times daily.     Historical Provider, MD  furosemide (LASIX) 20 MG tablet Take 20 mg by mouth.    Historical Provider, MD  hydrocortisone (ANUSOL-HC) 2.5 % rectal cream Apply topically.    Historical Provider, MD  lisinopril (ZESTRIL) 10 MG tablet Take 1 tablet (10 mg total) by mouth daily. 01/25/16   Henreitta Leber, MD  Magnesium 250 MG TABS Take 250 mg by mouth 2 (two) times daily.    Historical Provider, MD  ondansetron (ZOFRAN-ODT) 4 MG disintegrating tablet Take 4 mg by mouth every 8 (eight) hours as needed.  03/01/16  Historical Provider, MD  pantoprazole (PROTONIX) 40 MG tablet Take 1 tablet (40 mg total) by mouth 2 (two) times daily. 01/25/16   Henreitta Leber, MD  phenazopyridine (PYRIDIUM) 100 MG tablet  12/19/13   Historical Provider, MD  polyethylene glycol powder (GLYCOLAX/MIRALAX) powder  01/09/14   Historical Provider, MD  potassium chloride (MICRO-K) 10 MEQ CR capsule Take 10 mEq by mouth 2 (two) times daily.     Historical Provider, MD  senna-docusate (SENOKOT-S) 8.6-50 MG tablet Take 1 tablet by mouth at bedtime as needed for mild constipation. 03/11/16   Nicholes Mango, MD  tamsulosin (FLOMAX) 0.4 MG CAPS capsule TAKE ONE CAPSULE BY MOUTH DAILY WITH BREAKFAST 07/13/14   Historical Provider, MD  traZODone (DESYREL) 50 MG tablet Take 50 mg by mouth at bedtime as needed.    Historical Provider, MD    Allergies Ferrous sulfate  Family History  Problem Relation Age of Onset  . Breast cancer Sister   . CAD Father   . CAD Brother     Social History Social History  Substance Use Topics  . Smoking status: Former Smoker    Packs/day: 1.00    Types: Cigarettes     Quit date: 08/01/2012  . Smokeless tobacco: Never Used  . Alcohol use No    Review of Systems Constitutional: No fever/chills Eyes: No visual changes. ENT: No sore throat. Cardiovascular: Positive for chest pain. Respiratory: Denies shortness of breath. Gastrointestinal: No abdominal pain.  No nausea, no vomiting.  No diarrhea.  No constipation. Genitourinary: Negative for dysuria. Musculoskeletal: Negative for back pain. Skin: Negative for rash. Neurological: Negative for headaches, focal weakness or numbness.  10-point ROS otherwise negative.  ____________________________________________   PHYSICAL EXAM:  VITAL SIGNS: ED Triage Vitals  Enc Vitals Group     BP 07/21/16 0446 (!) 187/85     Pulse Rate 07/21/16 0446 68     Resp 07/21/16 0446 17     Temp 07/21/16 0446 97.6 F (36.4 C)     Temp src --      SpO2 07/21/16 0446 99 %     Weight 07/21/16 0438 147 lb (66.7 kg)     Height 07/21/16 0438 5\' 1"  (1.549 m)     Head Circumference --      Peak Flow --      Pain Score 07/21/16 0438 10     Pain Loc --      Pain Edu? --      Excl. in [place]? --     Constitutional: Alert and oriented. Well appearing and in no acute distress. Eyes: Conjunctivae are normal. PERRL. EOMI. Head: Atraumatic. Ears:  Healthy appearing ear canals and TMs bilaterally Nose: No congestion/rhinnorhea. Mouth/Throat: Mucous membranes are moist.  Oropharynx non-erythematous. Neck: No stridor.  No meningeal signs.  No cervical spine tenderness to palpation. Cardiovascular: Normal rate, regular rhythm. Good peripheral circulation. Grossly normal heart sounds.Pain to palpation anterior chest wall Respiratory: Normal respiratory effort.  No retractions. Lungs CTAB. Gastrointestinal: Soft and nontender. No distention.  Musculoskeletal: No lower extremity tenderness nor edema. No gross deformities of extremities. Neurologic:  Normal speech and language. No gross focal neurologic deficits are appreciated.   Skin:  Skin is warm, dry and intact. No rash noted. Psychiatric: Mood and affect are normal. Speech and behavior are normal.  ____________________________________________   LABS (all labs ordered are listed, but only abnormal results are displayed)  Labs Reviewed  BASIC METABOLIC PANEL - Abnormal; Notable for the following:  Result Value   Potassium 3.4 (*)    Glucose, Bld 113 (*)    All other components within normal limits  CBC - Abnormal; Notable for the following:    MCV 100.1 (*)    RDW 15.1 (*)    All other components within normal limits  TROPONIN I   ____________________________________________  EKG  ED ECG REPORT I, [place] N BROWN, the attending physician, personally viewed and interpreted this ECG.   Date: 07/21/2016  EKG Time: 4:43 AM  Rate: 72  Rhythm: Normal sinus rhythm  Axis: Normal  Intervals: Normal  ST&T Change: None  ____________________________________________  RADIOLOGY I, [place] N BROWN, personally viewed and evaluated these images (plain radiographs) as part of my medical decision making, as well as reviewing the written report by the radiologist.  Dg Chest Port 1 View  Result Date: 07/21/2016 CLINICAL DATA:  Initial evaluation for acute mid chest pain radiating to back. EXAM: PORTABLE CHEST 1 VIEW COMPARISON:  Prior radiograph from 10/1 04/19/2016. FINDINGS: Cardiomegaly with sequela of prior CABG, stable. Mediastinal silhouette within normal limits. Aortic atherosclerosis noted. Vascular stent overlies the upper mediastinum. Lungs normally inflated. No focal infiltrate, pulmonary edema, or pleural effusion. No pneumothorax. No acute osseus abnormality. IMPRESSION: 1. No active cardiopulmonary disease. 2. Stable cardiomegaly with sequela of prior CABG. Electronically Signed   By: Jeannine Boga M.D.   On: 07/21/2016 06:09    ____________________________________________   Procedures     INITIAL IMPRESSION / ASSESSMENT AND  PLAN / ED COURSE  Pertinent labs & imaging results that were available during my care of the patient were reviewed by me and considered in my medical decision making (see chart for details).  55 year old lady with extensive cardiac history with central chest pain consistent with previous episodes of chronic chest pain. EKG revealed no evidence of ST segment elevation or depression. Troponin negative 1 we'll obtain CT scan of the chest to rule out possibility of PE. Plan to obtain a second troponin. Patient care transferred to Dr. Dwaine Deter   Clinical Course    ____________________________________________  FINAL CLINICAL IMPRESSION(S) / ED DIAGNOSES  Final diagnoses:  Chest pain, moderate coronary artery risk     MEDICATIONS GIVEN DURING THIS VISIT:  Medications  morphine 2 MG/ML injection 2 mg (2 mg Intravenous Given 07/21/16 0512)  ondansetron (ZOFRAN) injection 4 mg (4 mg Intravenous Given 07/21/16 0512)  iopamidol (ISOVUE-370) 76 % injection 100 mL (100 mLs Intravenous Contrast Given 07/21/16 0545)     NEW OUTPATIENT MEDICATIONS STARTED DURING THIS VISIT:  New Prescriptions   No medications on file    Modified Medications   No medications on file    Discontinued Medications   No medications on file     Note:  This document was prepared using Dragon voice recognition software and may include unintentional dictation errors.    Gregor Hams, MD 07/21/16 [phone]

## 2016-07-28 MED ORDER — METOPROLOL TARTRATE 5 MG/5ML IV SOLN
INTRAVENOUS | Status: AC
  Filled 2016-07-28: qty 5

## 2016-08-03 ENCOUNTER — Emergency Department: Payer: BLUE CROSS/BLUE SHIELD

## 2016-08-03 ENCOUNTER — Encounter: Payer: Self-pay | Admitting: *Deleted

## 2016-08-03 DIAGNOSIS — Z85828 Personal history of other malignant neoplasm of skin: Secondary | ICD-10-CM | POA: Insufficient documentation

## 2016-08-03 DIAGNOSIS — I129 Hypertensive chronic kidney disease with stage 1 through stage 4 chronic kidney disease, or unspecified chronic kidney disease: Secondary | ICD-10-CM | POA: Diagnosis not present

## 2016-08-03 DIAGNOSIS — Z79899 Other long term (current) drug therapy: Secondary | ICD-10-CM | POA: Diagnosis not present

## 2016-08-03 DIAGNOSIS — J449 Chronic obstructive pulmonary disease, unspecified: Secondary | ICD-10-CM | POA: Insufficient documentation

## 2016-08-03 DIAGNOSIS — Z955 Presence of coronary angioplasty implant and graft: Secondary | ICD-10-CM | POA: Insufficient documentation

## 2016-08-03 DIAGNOSIS — N189 Chronic kidney disease, unspecified: Secondary | ICD-10-CM | POA: Insufficient documentation

## 2016-08-03 DIAGNOSIS — Z87891 Personal history of nicotine dependence: Secondary | ICD-10-CM | POA: Diagnosis not present

## 2016-08-03 DIAGNOSIS — G8929 Other chronic pain: Secondary | ICD-10-CM | POA: Diagnosis not present

## 2016-08-03 DIAGNOSIS — I2510 Atherosclerotic heart disease of native coronary artery without angina pectoris: Secondary | ICD-10-CM | POA: Diagnosis not present

## 2016-08-03 DIAGNOSIS — R339 Retention of urine, unspecified: Secondary | ICD-10-CM | POA: Insufficient documentation

## 2016-08-03 LAB — BASIC METABOLIC PANEL
ANION GAP: 8 (ref 5–15)
BUN: 38 mg/dL — ABNORMAL HIGH (ref 6–20)
CALCIUM: 9.7 mg/dL (ref 8.9–10.3)
CO2: 32 mmol/L (ref 22–32)
CREATININE: 0.85 mg/dL (ref 0.44–1.00)
Chloride: 103 mmol/L (ref 101–111)
Glucose, Bld: 144 mg/dL — ABNORMAL HIGH (ref 65–99)
Potassium: 3.8 mmol/L (ref 3.5–5.1)
Sodium: 143 mmol/L (ref 135–145)

## 2016-08-03 LAB — CBC
HCT: 44.2 % (ref 35.0–47.0)
Hemoglobin: 15.2 g/dL (ref 12.0–16.0)
MCH: 33.5 pg (ref 26.0–34.0)
MCHC: 34.3 g/dL (ref 32.0–36.0)
MCV: 97.7 fL (ref 80.0–100.0)
PLATELETS: 223 10*3/uL (ref 150–440)
RBC: 4.53 MIL/uL (ref 3.80–5.20)
RDW: 13.9 % (ref 11.5–14.5)
WBC: 9.1 10*3/uL (ref 3.6–11.0)

## 2016-08-03 LAB — TROPONIN I

## 2016-08-03 NOTE — ED Triage Notes (Addendum)
Pt to triage via wheelchair.  Pt reports urinary retention and bladder spasms.  Sx for 1 month.  Pt also reports sob.  No chest pain.  No n/v/   No diaphoresis.   Pt alert.

## 2016-08-03 NOTE — ED Notes (Signed)
Pt unable to void at this time. 

## 2016-08-04 ENCOUNTER — Emergency Department
Admission: EM | Admit: 2016-08-04 | Discharge: 2016-08-04 | Disposition: A | Discharge status: Home / Self Care | Payer: BLUE CROSS/BLUE SHIELD | Attending: Emergency Medicine | Admitting: Emergency Medicine

## 2016-08-04 DIAGNOSIS — G8929 Other chronic pain: Secondary | ICD-10-CM

## 2016-08-04 DIAGNOSIS — R339 Retention of urine, unspecified: Secondary | ICD-10-CM

## 2016-08-04 LAB — URINALYSIS COMPLETE WITH MICROSCOPIC (ARMC ONLY)
BILIRUBIN URINE: NEGATIVE
GLUCOSE, UA: NEGATIVE mg/dL
HGB URINE DIPSTICK: NEGATIVE
Ketones, ur: NEGATIVE mg/dL
Leukocytes, UA: NEGATIVE
NITRITE: NEGATIVE
Protein, ur: NEGATIVE mg/dL
SPECIFIC GRAVITY, URINE: 1.028 (ref 1.005–1.030)
pH: 6 (ref 5.0–8.0)

## 2016-08-04 NOTE — ED Notes (Signed)

## 2016-08-04 NOTE — ED Notes (Signed)
Foley cath removed.  Pt tolerated well.   approx 220 cc urine in foley bag.

## 2016-08-04 NOTE — ED Provider Notes (Signed)
[facility] Emergency Department Provider Note    First MD Initiated Contact with Patient 08/04/16 0116     (approximate)  I have reviewed the triage vital signs and the nursing notes.   HISTORY  Chief Complaint Urinary Retention    HPI Kirsten Castro is a 55 y.o. female presents with inability to urinate 1 month. Patient states that there is "urine floating around in my belly all in my chest". Patient states that she drinks plenty of fluids however no urine comes out.   Past Medical History:  Diagnosis Date  . Anemia   . Anginal pain ([place])   . Anxiety   . 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  This was done at [facility]  . BP (high blood pressure) 02/03/2016  . Cancer ([place])    melanoma skin cancer  . Carotid stenosis 02/10/2015  . Chronic kidney disease    UTI  . Colitis 12/15/2015  . Collagen vascular disease ([place])   . COPD (chronic obstructive pulmonary disease) ([place])   . Coronary artery disease   . Depression   . Esophageal candidiasis ([place])   . Gastritis   . GERD (gastroesophageal reflux disease)   . GI bleed 12/15/2015  . Headache   . Hypertension   . Ischemic colitis ([place]) 01/01/2016   Overview:   SMA mesenteric ischemia now status post angioplasty by vascular of In stent restenosis.   Overview:  S?P SMA angioplasty   . Myocardial infarction   . Nausea and vomiting in adult   . Noninfectious diarrhea   . Peripheral vascular disease ([place]) 02/03/2016   Overview:  Follows with Dr. Delana Meyer    . Shortness of breath dyspnea    with exertion  . Stroke  [facility])    TIA X 2  . Temporary cerebral vascular dysfunction 02/03/2016    Patient Active Problem List   Diagnosis Date Noted  . Abdominal pain 04/19/2016  . Sepsis ([place]) 03/09/2016  . Urinary retention 03/09/2016  . CAD (coronary artery disease) 03/09/2016  . Frank hematuria 03/08/2016  . Temporary cerebral vascular dysfunction 02/03/2016  . Peripheral vascular disease ([place]) 02/03/2016  . BP (high blood pressure) 02/03/2016  . HLD (hyperlipidemia) 02/03/2016  . Clinical depression 02/03/2016  . Carotid artery narrowing 02/03/2016  . 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 Anxiety 02/03/2016  . Abdominal pain, generalized   . Nausea with vomiting   . Gastritis   . Esophageal candidiasis ([place])   . Noninfectious diarrhea   . Intractable pain 01/21/2016  . Upper abdominal pain   . Nausea and vomiting in adult   . Ischemic colitis ([place]) 01/01/2016  . Colitis 12/15/2015  . GI bleed 12/15/2015  . Acute inflammation of the pancreas 12/06/2015  . Vitamin B12 deficiency anemia 02/25/2015  . Absolute anemia 02/25/2015  . Carotid stenosis 02/10/2015  . Personal history of other specified conditions 11/27/2014  . Osteomyelitis ([place]) 11/27/2014  . H/O coronary artery bypass surgery 05/18/2014    Past Surgical History:  Procedure Laterality Date  . ABDOMINAL HYSTERECTOMY    . BACK SURGERY    . BREAST BIOPSY Left 6 2017   results not in yet  . COLONOSCOPY WITH PROPOFOL N/A 01/24/2016   Procedure: COLONOSCOPY WITH PROPOFOL;  Surgeon: Lucilla Lame, MD;  Location: [facility] ENDOSCOPY;  Service: Endoscopy;  Laterality: N/A;  . CORONARY ANGIOPLASTY    . CORONARY ARTERY BYPASS GRAFT    . ESOPHAGOGASTRODUODENOSCOPY (EGD) WITH PROPOFOL N/A 01/24/2016   Procedure: ESOPHAGOGASTRODUODENOSCOPY (EGD) WITH PROPOFOL;  Surgeon: Lucilla Lame, MD;  Location: [facility] ENDOSCOPY;  Service: Endoscopy;  Laterality: N/A;  . KYPHOPLASTY N/A  02/08/2016   Procedure: KYPHOPLASTY L1;  Surgeon: Hessie Knows, MD;  Location: [facility] ORS;  Service: Orthopedics;  Laterality: N/A;  . KYPHOPLASTY N/A 03/28/2016   Procedure: KYPHOPLASTY;  Surgeon: Hessie Knows, MD;  Location: [facility] ORS;  Service: Orthopedics;  Laterality: N/A;  . NOSE SURGERY     cancer removal  . OTHER SURGICAL HISTORY  Jul 11 2014   sternum removal  . PERIPHERAL VASCULAR CATHETERIZATION N/A 02/10/2015   Procedure: Carotid PTA/Stent Intervention;  Surgeon: Katha Cabal, MD;  Location: [place] CV LAB;  Service: Cardiovascular;  Laterality: N/A;  . PERIPHERAL VASCULAR CATHETERIZATION N/A 12/17/2015   Procedure: Visceral Angiography;  Surgeon: Katha Cabal, MD;  Location: [employer] CV LAB;  Service: Cardiovascular;  Laterality: N/A;  . PERIPHERAL VASCULAR CATHETERIZATION N/A 12/17/2015   Procedure: Visceral Artery Intervention;  Surgeon: Katha Cabal, MD;  Location: [place] CV LAB;  Service: Cardiovascular;  Laterality: N/A;  . STENT PLACEMENT VASCULAR ([place] HX)      Prior to Admission medications   Medication Sig Start Date End Date Taking? Authorizing Provider  acetaminophen (TYLENOL) 500 MG tablet Take 1,000 mg by mouth every 6 (six) hours as needed for mild pain or headache.    Historical Provider, MD  ALPRAZolam Duanne Moron) 1 MG tablet Take 1 mg by mouth 3 (three) times daily as needed for anxiety or sleep.     Historical Provider, MD  atorvastatin (LIPITOR) 80 MG tablet Take 80 mg by mouth daily.    Historical Provider, MD  cetirizine (ZYRTEC) 10 MG tablet Take 10 mg by mouth daily. Reported on 02/08/2016    Historical Provider, MD  clopidogrel (PLAVIX) 75 MG tablet Take 75 mg by mouth daily.  02/02/16   Historical Provider, MD  clotrimazole (LOTRIMIN) 1 % cream APP EXT AA BID 12/02/15   Historical Provider, MD  clotrimazole-betamethasone (LOTRISONE) cream Apply topically.    Historical Provider, MD  cyclobenzaprine (FLEXERIL) 10 MG tablet Take 10 mg by  mouth 3 (three) times daily as needed for muscle spasms.     Historical Provider, MD  Fluticasone-Salmeterol (ADVAIR DISKUS) 250-50 MCG/DOSE AEPB Inhale 1 puff into the lungs 2 (two) times daily.     Historical Provider, MD  furosemide (LASIX) 20 MG tablet Take 20 mg by mouth.    Historical Provider, MD  hydrocortisone (ANUSOL-HC) 2.5 % rectal cream Apply topically.    Historical Provider, MD  lisinopril (ZESTRIL) 10 MG tablet Take 1 tablet (10 mg total) by mouth daily. 01/25/16   Henreitta Leber, MD  Magnesium 250 MG TABS Take 250 mg by mouth 2 (two) times daily.    Historical Provider, MD  ondansetron (ZOFRAN-ODT) 4 MG disintegrating tablet Take 4 mg by mouth every 8 (eight) hours as needed.  03/01/16   Historical Provider, MD  pantoprazole (PROTONIX) 40 MG tablet Take 1 tablet (40 mg total) by mouth 2 (  two) times daily. 01/25/16   Henreitta Leber, MD  phenazopyridine (PYRIDIUM) 100 MG tablet  12/19/13   Historical Provider, MD  polyethylene glycol powder (GLYCOLAX/MIRALAX) powder  01/09/14   Historical Provider, MD  potassium chloride (MICRO-K) 10 MEQ CR capsule Take 10 mEq by mouth 2 (two) times daily.     Historical Provider, MD  senna-docusate (SENOKOT-S) 8.6-50 MG tablet Take 1 tablet by mouth at bedtime as needed for mild constipation. 03/11/16   Nicholes Mango, MD  tamsulosin (FLOMAX) 0.4 MG CAPS capsule TAKE ONE CAPSULE BY MOUTH DAILY WITH BREAKFAST 07/13/14   Historical Provider, MD  traZODone (DESYREL) 50 MG tablet Take 50 mg by mouth at bedtime as needed.    Historical Provider, MD    Allergies Ferrous sulfate  Family History  Problem Relation Age of Onset  . Breast cancer Sister   . CAD Father   . CAD Brother     Social History Social History  Substance Use Topics  . Smoking status: Former Smoker    Packs/day: 1.00    Types: Cigarettes    Quit date: 08/01/2012  . Smokeless tobacco: Never Used  . Alcohol use No    Review of Systems Constitutional: No fever/chills Eyes: No  visual changes. ENT: No sore throat. Cardiovascular: Denies chest pain. Respiratory: Denies shortness of breath. Gastrointestinal: No abdominal pain.  No nausea, no vomiting.  No diarrhea.  No constipation. Genitourinary: Negative for dysuria.Positive for urinary retention Musculoskeletal: Negative for back pain. Skin: Negative for rash. Neurological: Negative for headaches, focal weakness or numbness.  10-point ROS otherwise negative.  ____________________________________________   PHYSICAL EXAM:  VITAL SIGNS: ED Triage Vitals [08/03/16 2234]  Enc Vitals Group     BP 126/81     Pulse Rate 86     Resp 20     Temp 99.1 F (37.3 C)     Temp Source Oral     SpO2 97 %     Weight 134 lb (60.8 kg)     Height 5\' 1"  (1.549 m)     Head Circumference      Peak Flow      Pain Score 8     Pain Loc      Pain Edu?      Excl. in [place]?     Constitutional: Alert and oriented. Well appearing and in no acute distress. Eyes: Conjunctivae are normal. PERRL. EOMI. Head: Atraumatic. Mouth/Throat: Mucous membranes are moist.  Oropharynx non-erythematous. Neck: No stridor.  No meningeal signs.  No cervical spine tenderness to palpation. Cardiovascular: Normal rate, regular rhythm. Good peripheral circulation. Grossly normal heart sounds. Respiratory: Normal respiratory effort.  No retractions. Lungs CTAB. Gastrointestinal: Soft and nontender. No distention.  Genitourinary: No pain with super. Palpation bladder scan revealed 205 ML's of urine Musculoskeletal: No lower extremity tenderness nor edema. No gross deformities of extremities. Neurologic:  Normal speech and language. No gross focal neurologic deficits are appreciated.  Skin:  Skin is warm, dry and intact. No rash noted. Psychiatric: Mood and affect are normal. Speech and behavior are normal.  ____________________________________________   LABS (all labs ordered are listed, but only abnormal results are displayed)  Labs Reviewed    BASIC METABOLIC PANEL - Abnormal; Notable for the following:       Result Value   Glucose, Bld 144 (*)    BUN 38 (*)    All other components within normal limits  CBC  TROPONIN I  URINALYSIS COMPLETEWITH MICROSCOPIC ([facility] ONLY)  RADIOLOGY I, [place], personally viewed and evaluated these images (plain radiographs) as part of my medical decision making, as well as reviewing the written report by the radiologist.  Dg Chest 2 View  Result Date: 08/03/2016 CLINICAL DATA:  Initial evaluation for acute shortness of breath. EXAM: CHEST  2 VIEW COMPARISON:  Prior radiograph from 07/21/2016. FINDINGS: Cardiomegaly with CABG markers noted, stable coronary stent noted. Mediastinal silhouette within normal limits. Vascular stent overlies the upper left mediastinum. Aortic atherosclerosis present. Lungs normally inflated. No focal infiltrates, pulmonary edema, or pleural effusion. No pneumothorax. No acute osseous abnormality. Remote compression deformity within the upper lumbar spine with sequela of prior vertebral augmentation noted. IMPRESSION: 1. No active cardiopulmonary disease. 2. Stable cardiomegaly with sequela of prior CABG. Electronically Signed   By: Jeannine Boga M.D.   On: 08/03/2016 23:10     Procedures   _   INITIAL IMPRESSION / ASSESSMENT AND PLAN / ED COURSE  Pertinent labs & imaging results that were available during my care of the patient were reviewed by me and considered in my medical decision making (see chart for details).  Patient informed of bladder scan results and became very upset adamantly disagree with the bladder scan findings. As such I told the patient that we would be willing to place a Foley catheter which she insisted upon having done. Catheter placed and approximately 220 ML's of urine noted in Foley bag. No additional urine noted in the Foley tubing. I showed the patient the contents of the Foley bag and informed her that there were 220  ML's of urine there to which she stated that there "must be more in there". I showed the patient tubing to prove that there was no additional urine and offered to repeat the bladder scan. Patient very upset with clinical findings thus far stating that "my belly still with urine".  Clinical Course    ____________________________________________  FINAL CLINICAL IMPRESSION(S) / ED DIAGNOSES  Final diagnoses:  Other chronic pain  Urinary retention     MEDICATIONS GIVEN DURING THIS VISIT:  Medications - No data to display   NEW OUTPATIENT MEDICATIONS STARTED DURING THIS VISIT:  New Prescriptions   No medications on file    Modified Medications   No medications on file    Discontinued Medications   No medications on file     Note:  This document was prepared using Dragon voice recognition software and may include unintentional dictation errors.    Gregor Hams, MD 08/04/16 [phone]

## 2016-08-04 NOTE — ED Notes (Signed)
Bladder scan performed x 3. Bladder residuals showing 205cc. Screen showed to patient by MD. Patient does not believe it. Patient extremely frustrated because there is urine building up in her belly, but no one believes her. MD discussed previous CT scans from where she has been here before for the same. MD discussed ascites with patient, which only made her even more upset because "you people dont believe me". MD gave patient the option of having a catheter placed to drain her bladder because she feels like she cannot void. Patient states, "That will take too long. There is so much in there. It is going to take a year". MD discussed with patient that there was only about 225mL in her bladder and that it would drain and the catheter would be removed. Patient states that she wished to have her bladder drained in order to prove to MD that there was more that what the bladder scan showed in her body. RN made sure to make patient aware that foley would not drain the fluid from her abdomen, only her bladder. Patient yelled, "Whatever. Just do it."

## 2016-08-04 NOTE — ED Notes (Signed)
MD and RN to bedside for patient assessment.

## 2016-08-04 NOTE — ED Notes (Signed)
MD with [facility] for bladder scan. RN to bedside to perform procedure and patient because upset citing that the urine was not in her bladder, rather it was leaking out and building up into her chest. Patient refusing scan at this time. States, "It is not in there. I dont need my bladder looked at. I drink sodas, and water, and other stuff, but it never comes out". RN out of room to speak with MD.

## 2016-08-13 ENCOUNTER — Emergency Department
Admission: EM | Admit: 2016-08-13 | Discharge: 2016-08-14 | Disposition: A | Discharge status: Home / Self Care | Payer: BLUE CROSS/BLUE SHIELD | Attending: Emergency Medicine | Admitting: Emergency Medicine

## 2016-08-13 ENCOUNTER — Emergency Department: Payer: BLUE CROSS/BLUE SHIELD

## 2016-08-13 ENCOUNTER — Encounter: Payer: Self-pay | Admitting: Emergency Medicine

## 2016-08-13 DIAGNOSIS — R4182 Altered mental status, unspecified: Secondary | ICD-10-CM

## 2016-08-13 DIAGNOSIS — Z79899 Other long term (current) drug therapy: Secondary | ICD-10-CM | POA: Insufficient documentation

## 2016-08-13 DIAGNOSIS — Z87891 Personal history of nicotine dependence: Secondary | ICD-10-CM | POA: Insufficient documentation

## 2016-08-13 DIAGNOSIS — F13120 Sedative, hypnotic or anxiolytic abuse with intoxication, uncomplicated: Secondary | ICD-10-CM | POA: Insufficient documentation

## 2016-08-13 DIAGNOSIS — Z85828 Personal history of other malignant neoplasm of skin: Secondary | ICD-10-CM | POA: Diagnosis not present

## 2016-08-13 DIAGNOSIS — I129 Hypertensive chronic kidney disease with stage 1 through stage 4 chronic kidney disease, or unspecified chronic kidney disease: Secondary | ICD-10-CM | POA: Diagnosis not present

## 2016-08-13 DIAGNOSIS — F13129 Sedative, hypnotic or anxiolytic abuse with intoxication, unspecified: Secondary | ICD-10-CM

## 2016-08-13 DIAGNOSIS — Z951 Presence of aortocoronary bypass graft: Secondary | ICD-10-CM | POA: Diagnosis not present

## 2016-08-13 DIAGNOSIS — I2510 Atherosclerotic heart disease of native coronary artery without angina pectoris: Secondary | ICD-10-CM | POA: Diagnosis not present

## 2016-08-13 DIAGNOSIS — N189 Chronic kidney disease, unspecified: Secondary | ICD-10-CM | POA: Insufficient documentation

## 2016-08-13 DIAGNOSIS — T424X1A Poisoning by benzodiazepines, accidental (unintentional), initial encounter: Secondary | ICD-10-CM

## 2016-08-13 DIAGNOSIS — J449 Chronic obstructive pulmonary disease, unspecified: Secondary | ICD-10-CM | POA: Insufficient documentation

## 2016-08-13 LAB — COMPREHENSIVE METABOLIC PANEL
ALBUMIN: 3.9 g/dL (ref 3.5–5.0)
ALK PHOS: 78 U/L (ref 38–126)
ALT: 18 U/L (ref 14–54)
AST: 25 U/L (ref 15–41)
Anion gap: 6 (ref 5–15)
BILIRUBIN TOTAL: 1.3 mg/dL — AB (ref 0.3–1.2)
BUN: 25 mg/dL — AB (ref 6–20)
CALCIUM: 9.4 mg/dL (ref 8.9–10.3)
CO2: 28 mmol/L (ref 22–32)
Chloride: 104 mmol/L (ref 101–111)
Creatinine, Ser: 0.69 mg/dL (ref 0.44–1.00)
GFR calc Af Amer: >60 mL/min (ref >60)
GFR calc non Af Amer: >60 mL/min (ref >60)
GLUCOSE: 96 mg/dL (ref 65–99)
Potassium: 4.2 mmol/L (ref 3.5–5.1)
Sodium: 138 mmol/L (ref 135–145)
TOTAL PROTEIN: 6.9 g/dL (ref 6.5–8.1)

## 2016-08-13 LAB — CBC WITH DIFFERENTIAL/PLATELET
BASOS ABS: 0 10*3/uL (ref 0–0.1)
BASOS PCT: 0 %
Eosinophils Absolute: 0.1 10*3/uL (ref 0–0.7)
Eosinophils Relative: 1 %
HEMATOCRIT: 48.6 % — AB (ref 35.0–47.0)
HEMOGLOBIN: 16.1 g/dL — AB (ref 12.0–16.0)
Lymphocytes Relative: 16 %
Lymphs Abs: 1.6 10*3/uL (ref 1.0–3.6)
MCH: 33.2 pg (ref 26.0–34.0)
MCHC: 33.1 g/dL (ref 32.0–36.0)
MCV: 100.3 fL — ABNORMAL HIGH (ref 80.0–100.0)
MONOS PCT: 6 %
Monocytes Absolute: 0.6 10*3/uL (ref 0.2–0.9)
NEUTROS ABS: 7.4 10*3/uL — AB (ref 1.4–6.5)
NEUTROS PCT: 77 %
Platelets: 183 10*3/uL (ref 150–440)
RBC: 4.85 MIL/uL (ref 3.80–5.20)
RDW: 14 % (ref 11.5–14.5)
WBC: 9.8 10*3/uL (ref 3.6–11.0)

## 2016-08-13 LAB — URINE DRUG SCREEN, QUALITATIVE (ARMC ONLY)
AMPHETAMINES, UR SCREEN: NOT DETECTED
BARBITURATES, UR SCREEN: NOT DETECTED
BENZODIAZEPINE, UR SCRN: POSITIVE — AB
Cannabinoid 50 Ng, Ur ~~LOC~~: NOT DETECTED
Cocaine Metabolite,Ur ~~LOC~~: NOT DETECTED
MDMA (Ecstasy)Ur Screen: NOT DETECTED
METHADONE SCREEN, URINE: NOT DETECTED
OPIATE, UR SCREEN: NOT DETECTED
Phencyclidine (PCP) Ur S: NOT DETECTED
TRICYCLIC, UR SCREEN: NOT DETECTED

## 2016-08-13 LAB — URINALYSIS COMPLETE WITH MICROSCOPIC (ARMC ONLY)
BILIRUBIN URINE: NEGATIVE
GLUCOSE, UA: NEGATIVE mg/dL
Hgb urine dipstick: NEGATIVE
Ketones, ur: NEGATIVE mg/dL
Leukocytes, UA: NEGATIVE
Nitrite: NEGATIVE
PH: 6 (ref 5.0–8.0)
Protein, ur: NEGATIVE mg/dL
SQUAMOUS EPITHELIAL / LPF: NONE SEEN
Specific Gravity, Urine: 1.018 (ref 1.005–1.030)

## 2016-08-13 LAB — TSH: TSH: 0.207 u[IU]/mL — AB (ref 0.350–4.500)

## 2016-08-13 LAB — TROPONIN I: Troponin I: <0.03 ng/mL (ref <0.03)

## 2016-08-13 LAB — ETHANOL: Alcohol, Ethyl (B): <5 mg/dL (ref <5)

## 2016-08-13 MED ORDER — ONDANSETRON HCL 4 MG PO TABS: 4.0000 mg | ORAL_TABLET | Freq: Every day | ORAL | 1 refills | Status: DC | PRN

## 2016-08-13 MED ORDER — ONDANSETRON HCL 4 MG/2ML IJ SOLN
4.0000 mg | Freq: Once | INTRAMUSCULAR | Status: AC
  Administered 2016-08-13: 4 mg via INTRAVENOUS
  Filled 2016-08-13: qty 2

## 2016-08-13 MED ORDER — LORAZEPAM 2 MG/ML IJ SOLN
1.0000 mg | Freq: Once | INTRAMUSCULAR | Status: AC
  Administered 2016-08-13: 1 mg via INTRAVENOUS
  Filled 2016-08-13: qty 1

## 2016-08-13 MED ORDER — SODIUM CHLORIDE 0.9 % IV BOLUS (SEPSIS)
1000.0000 mL | Freq: Once | INTRAVENOUS | Status: AC
  Administered 2016-08-13: 1000 mL via INTRAVENOUS

## 2016-08-13 MED ORDER — FLUMAZENIL 0.5 MG/5ML IV SOLN
0.2000 mg | Freq: Once | INTRAVENOUS | Status: AC
  Administered 2016-08-13: 0.2 mg via INTRAVENOUS
  Filled 2016-08-13: qty 5

## 2016-08-13 MED ORDER — NALOXONE HCL 2 MG/2ML IJ SOSY
PREFILLED_SYRINGE | INTRAMUSCULAR | Status: AC
  Administered 2016-08-13: 0.2 mg
  Filled 2016-08-13: qty 2

## 2016-08-13 NOTE — ED Provider Notes (Signed)
Patient is currently awake and alert, she has eaten. This appears to be accidental medication ingestion or secondary to taking too much Xanax. She is stable for outpatient follow-up with her doctor, I will advise holding Xanax    Kirsten Newport, MD 08/13/16 2207

## 2016-08-13 NOTE — ED Triage Notes (Signed)
Per [facility]: husband found pt. Reportedly unconscious in bathtub and moved her to bathroom floor. Pt. leathargic and confused. A&Ox2 upon arrival. Weak bilaterally, equal.

## 2016-08-13 NOTE — ED Provider Notes (Signed)
[facility] Emergency Department Provider Note   ____________________________________________   I have reviewed the triage vital signs and the nursing notes.   HISTORY  Chief Complaint Weakness and Fatigue   History limited by: Altered mental status   HPI Kirsten Castro is a 55 y.o. female who presents to the emergency department today after being found with decreased responsiveness. Per report the husband found the patient in the bathtub. The patient herself is somewhat still altered and unable to give a good history. She does state however that she felt nauseous throughout the day. She was feeling nauseous prior to going to the bathtub. The patient denies any headache, chest pain or shortness of breath.   Past Medical History:  Diagnosis Date  . Anemia   . Anginal pain ([place])   . Anxiety   . 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  This was done at [facility]  . BP (high blood pressure) 02/03/2016  . Cancer ([place])    melanoma skin cancer  . Carotid stenosis 02/10/2015  . Chronic kidney disease    UTI  . Colitis 12/15/2015  . Collagen vascular disease ([place])   . COPD (chronic obstructive pulmonary disease) ([place])   . Coronary artery disease   . Depression   . Esophageal candidiasis ([place])   . Gastritis   . GERD (gastroesophageal reflux disease)   . GI bleed 12/15/2015  . Headache   . Hypertension   . Ischemic colitis ([place]) 01/01/2016   Overview:   SMA mesenteric ischemia now status post angioplasty by vascular of In stent restenosis.   Overview:  S?P SMA angioplasty    . Myocardial infarction   . Nausea and vomiting in adult   . Noninfectious diarrhea   . Peripheral vascular disease ([place]) 02/03/2016   Overview:  Follows with Dr. Delana Meyer    . Shortness of breath dyspnea    with exertion  . Stroke [facility])    TIA X 2  . Temporary cerebral vascular dysfunction 02/03/2016    Patient Active Problem List   Diagnosis Date Noted  . Abdominal pain 04/19/2016  . Sepsis ([place]) 03/09/2016  . Urinary retention 03/09/2016  . CAD (coronary artery disease) 03/09/2016  . Frank hematuria 03/08/2016  . Temporary cerebral vascular dysfunction 02/03/2016  . Peripheral vascular disease ([place]) 02/03/2016  . BP (high blood pressure) 02/03/2016  . HLD (hyperlipidemia) 02/03/2016  . Clinical depression 02/03/2016  . Carotid artery narrowing 02/03/2016  . 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 Anxiety 02/03/2016  . Abdominal pain, generalized   . Nausea with vomiting   . Gastritis   . Esophageal candidiasis ([place])   . Noninfectious diarrhea   . Intractable pain 01/21/2016  . Upper abdominal pain   . Nausea and vomiting in adult   . Ischemic colitis ([place]) 01/01/2016  . Colitis 12/15/2015  . GI bleed 12/15/2015  . Acute inflammation of the pancreas 12/06/2015  . Vitamin B12 deficiency anemia 02/25/2015  . Absolute anemia 02/25/2015  . Carotid stenosis 02/10/2015  .  Personal history of other specified conditions 11/27/2014  . Osteomyelitis ([place]) 11/27/2014  . H/O coronary artery bypass surgery 05/18/2014    Past Surgical History:  Procedure Laterality Date  . ABDOMINAL HYSTERECTOMY    . BACK SURGERY    . BREAST BIOPSY Left 6 2017   results not in yet  . COLONOSCOPY WITH PROPOFOL N/A 01/24/2016   Procedure: COLONOSCOPY WITH PROPOFOL;  Surgeon: Lucilla Lame, MD;  Location: [facility] ENDOSCOPY;  Service: Endoscopy;  Laterality: N/A;  . CORONARY ANGIOPLASTY    . CORONARY ARTERY BYPASS GRAFT    .  ESOPHAGOGASTRODUODENOSCOPY (EGD) WITH PROPOFOL N/A 01/24/2016   Procedure: ESOPHAGOGASTRODUODENOSCOPY (EGD) WITH PROPOFOL;  Surgeon: Lucilla Lame, MD;  Location: [facility] ENDOSCOPY;  Service: Endoscopy;  Laterality: N/A;  . KYPHOPLASTY N/A 02/08/2016   Procedure: KYPHOPLASTY L1;  Surgeon: Hessie Knows, MD;  Location: [facility] ORS;  Service: Orthopedics;  Laterality: N/A;  . KYPHOPLASTY N/A 03/28/2016   Procedure: KYPHOPLASTY;  Surgeon: Hessie Knows, MD;  Location: [facility] ORS;  Service: Orthopedics;  Laterality: N/A;  . NOSE SURGERY     cancer removal  . OTHER SURGICAL HISTORY  Jul 11 2014   sternum removal  . PERIPHERAL VASCULAR CATHETERIZATION N/A 02/10/2015   Procedure: Carotid PTA/Stent Intervention;  Surgeon: Katha Cabal, MD;  Location: [place] CV LAB;  Service: Cardiovascular;  Laterality: N/A;  . PERIPHERAL VASCULAR CATHETERIZATION N/A 12/17/2015   Procedure: Visceral Angiography;  Surgeon: Katha Cabal, MD;  Location: [place] CV LAB;  Service: Cardiovascular;  Laterality: N/A;  . PERIPHERAL VASCULAR CATHETERIZATION N/A 12/17/2015   Procedure: Visceral Artery Intervention;  Surgeon: Katha Cabal, MD;  Location: [place] CV LAB;  Service: Cardiovascular;  Laterality: N/A;  . STENT PLACEMENT VASCULAR ([place] HX)      Prior to Admission medications   Medication Sig Start Date End Date Taking? Authorizing Provider  acetaminophen (TYLENOL) 500 MG tablet Take 1,000 mg by mouth every 6 (six) hours as needed for mild pain or headache.    Historical Provider, MD  ALPRAZolam Duanne Moron) 1 MG tablet Take 1 mg by mouth 3 (three) times daily as needed for anxiety or sleep.     Historical Provider, MD  atorvastatin (LIPITOR) 80 MG tablet Take 80 mg by mouth daily.    Historical Provider, MD  cetirizine (ZYRTEC) 10 MG tablet Take 10 mg by mouth daily. Reported on 02/08/2016    Historical Provider, MD  clopidogrel (PLAVIX) 75 MG tablet Take 75 mg by mouth daily.  02/02/16   Historical Provider,  MD  clotrimazole (LOTRIMIN) 1 % cream APP EXT AA BID 12/02/15   Historical Provider, MD  clotrimazole-betamethasone (LOTRISONE) cream Apply topically.    Historical Provider, MD  cyclobenzaprine (FLEXERIL) 10 MG tablet Take 10 mg by mouth 3 (three) times daily as needed for muscle spasms.     Historical Provider, MD  Fluticasone-Salmeterol (ADVAIR DISKUS) 250-50 MCG/DOSE AEPB Inhale 1 puff into the lungs 2 (two) times daily.     Historical Provider, MD  furosemide (LASIX) 20 MG tablet Take 20 mg by mouth.    Historical Provider, MD  hydrocortisone (ANUSOL-HC) 2.5 % rectal cream Apply topically.    Historical Provider, MD  lisinopril (ZESTRIL) 10 MG tablet Take 1 tablet (10 mg total) by mouth daily. 01/25/16   Henreitta Leber, MD  Magnesium 250 MG TABS Take 250 mg by mouth 2 (two) times daily.    Historical Provider, MD  ondansetron (ZOFRAN-ODT) 4 MG disintegrating tablet Take 4 mg by mouth every  8 (eight) hours as needed.  03/01/16   Historical Provider, MD  pantoprazole (PROTONIX) 40 MG tablet Take 1 tablet (40 mg total) by mouth 2 (two) times daily. 01/25/16   Henreitta Leber, MD  phenazopyridine (PYRIDIUM) 100 MG tablet  12/19/13   Historical Provider, MD  polyethylene glycol powder (GLYCOLAX/MIRALAX) powder  01/09/14   Historical Provider, MD  potassium chloride (MICRO-K) 10 MEQ CR capsule Take 10 mEq by mouth 2 (two) times daily.     Historical Provider, MD  senna-docusate (SENOKOT-S) 8.6-50 MG tablet Take 1 tablet by mouth at bedtime as needed for mild constipation. 03/11/16   Nicholes Mango, MD  tamsulosin (FLOMAX) 0.4 MG CAPS capsule TAKE ONE CAPSULE BY MOUTH DAILY WITH BREAKFAST 07/13/14   Historical Provider, MD  traZODone (DESYREL) 50 MG tablet Take 50 mg by mouth at bedtime as needed.    Historical Provider, MD    Allergies Ferrous sulfate  Family History  Problem Relation Age of Onset  . Breast cancer Sister   . CAD Father   . CAD Brother     Social History Social History  Substance  Use Topics  . Smoking status: Former Smoker    Packs/day: 1.00    Types: Cigarettes    Quit date: 08/01/2012  . Smokeless tobacco: Never Used  . Alcohol use No    Review of Systems  Constitutional: Negative for fever. Cardiovascular: Negative for chest pain. Respiratory: Negative for shortness of breath. Gastrointestinal: Negative for abdominal pain, vomiting and diarrhea. Neurological: Negative for headaches, focal weakness or numbness.  10-point ROS otherwise negative.  ____________________________________________   PHYSICAL EXAM:  VITAL SIGNS: ED Triage Vitals  Enc Vitals Group     BP 08/13/16 1851 140/76     Pulse Rate 08/13/16 1851 71     Resp 08/13/16 1851 (!) 25     Temp 08/13/16 1851 97.5 F (36.4 C)     Temp Source 08/13/16 1851 Oral     SpO2 08/13/16 1851 99 %     Weight 08/13/16 1852 135 lb (61.2 kg)     Height 08/13/16 1852 5\' 1"  (1.549 m)     Head Circumference --      Peak Flow --      Pain Score 08/13/16 1852 0   Constitutional: Somnolent. Does awaken to verbal stimuli. Eyes: Conjunctivae are normal. Normal extraocular movements. ENT   Head: Normocephalic and atraumatic.   Nose: No congestion/rhinnorhea.   Mouth/Throat: Mucous membranes are moist.   Neck: No stridor. Hematological/Lymphatic/Immunilogical: No cervical lymphadenopathy. Cardiovascular: Normal rate, regular rhythm.  No murmurs, rubs, or gallops.  Respiratory: Normal respiratory effort without tachypnea nor retractions. Breath sounds are clear and equal bilaterally. No wheezes/rales/rhonchi. Gastrointestinal: Soft and nontender. No distention.  Genitourinary: Deferred Musculoskeletal: Normal range of motion in all extremities. No lower extremity edema. Neurologic:  Somnolent. Slightly sow speech. Is able to follow commands. Moves all extremities. Sensation appears grossly intact.  Skin:  Skin is warm, dry and intact. No rash noted. Psychiatric: Mood and affect are normal.  Speech and behavior are normal. Patient exhibits appropriate insight and judgment.  ____________________________________________    LABS (pertinent positives/negatives)  Labs Reviewed  CBC WITH DIFFERENTIAL/PLATELET - Abnormal; Notable for the following:       Result Value   Hemoglobin 16.1 (*)    HCT 48.6 (*)    MCV 100.3 (*)    Neutro Abs 7.4 (*)    All other components within normal limits  COMPREHENSIVE METABOLIC PANEL - Abnormal;  Notable for the following:    BUN 25 (*)    Total Bilirubin 1.3 (*)    All other components within normal limits  TROPONIN I  ETHANOL  URINALYSIS COMPLETEWITH MICROSCOPIC ([facility] ONLY)  URINE DRUG SCREEN, QUALITATIVE ([facility] ONLY)    UA and UDS pending at time of signout ____________________________________________   EKG  None  ____________________________________________    RADIOLOGY  CT head   IMPRESSION:  Chronic infarct left frontal white matter. No acute abnormality.   ____________________________________________   PROCEDURES  Procedures  ____________________________________________   INITIAL IMPRESSION / ASSESSMENT AND PLAN / ED COURSE  Pertinent labs & imaging results that were available during my care of the patient were reviewed by me and considered in my medical decision making (see chart for details).  Patient here with decreased responsiveness. On exam he is somewhat somnolent but she does awaken. She does appear to be alert. At this point unclear etiology. CT head shows a chronic infarct but nothing acute. Blood work without any obvious etiology. I did add on a TSH. Furthermore waiting urine and urine drug screen. Patient was given Narcan without any great response. Additionally tried Ativan without any great response.  ____________________________________________   FINAL CLINICAL IMPRESSION(S) / ED DIAGNOSES  Altered Mental Status  Note: This dictation was prepared with Dragon dictation. Any transcriptional  errors that result from this process are unintentional    Nance Pear, MD 08/13/16 2114

## 2016-08-13 NOTE — ED Notes (Signed)
In to discharge pt home; pt has emesis bag under chin, says she feels weak and doesn't feel like she can make it home; spoke with Dr Jimmye Norman and order placed for nausea medication

## 2016-10-02 ENCOUNTER — Emergency Department: Payer: BLUE CROSS/BLUE SHIELD

## 2016-10-02 ENCOUNTER — Inpatient Hospital Stay
Admission: EM | Admit: 2016-10-02 | Discharge: 2016-10-20 | DRG: 885 | Disposition: A | Discharge status: Home / Self Care | Payer: BLUE CROSS/BLUE SHIELD | Source: Intra-hospital | Attending: Psychiatry | Admitting: Psychiatry

## 2016-10-02 ENCOUNTER — Encounter: Payer: Self-pay | Admitting: Emergency Medicine

## 2016-10-02 ENCOUNTER — Emergency Department
Admission: EM | Admit: 2016-10-02 | Discharge: 2016-10-02 | Disposition: A | Discharge status: Other Acute Inpatient Hospital | Payer: BLUE CROSS/BLUE SHIELD | Attending: Emergency Medicine | Admitting: Emergency Medicine

## 2016-10-02 DIAGNOSIS — Z87891 Personal history of nicotine dependence: Secondary | ICD-10-CM | POA: Insufficient documentation

## 2016-10-02 DIAGNOSIS — K219 Gastro-esophageal reflux disease without esophagitis: Secondary | ICD-10-CM | POA: Diagnosis present

## 2016-10-02 DIAGNOSIS — F329 Major depressive disorder, single episode, unspecified: Secondary | ICD-10-CM | POA: Diagnosis present

## 2016-10-02 DIAGNOSIS — I129 Hypertensive chronic kidney disease with stage 1 through stage 4 chronic kidney disease, or unspecified chronic kidney disease: Secondary | ICD-10-CM | POA: Diagnosis not present

## 2016-10-02 DIAGNOSIS — F323 Major depressive disorder, single episode, severe with psychotic features: Secondary | ICD-10-CM | POA: Diagnosis not present

## 2016-10-02 DIAGNOSIS — I10 Essential (primary) hypertension: Secondary | ICD-10-CM

## 2016-10-02 DIAGNOSIS — F13239 Sedative, hypnotic or anxiolytic dependence with withdrawal, unspecified: Secondary | ICD-10-CM

## 2016-10-02 DIAGNOSIS — J449 Chronic obstructive pulmonary disease, unspecified: Secondary | ICD-10-CM | POA: Diagnosis present

## 2016-10-02 DIAGNOSIS — R51 Headache: Secondary | ICD-10-CM | POA: Diagnosis not present

## 2016-10-02 DIAGNOSIS — Z915 Personal history of self-harm: Secondary | ICD-10-CM

## 2016-10-02 DIAGNOSIS — F13939 Sedative, hypnotic or anxiolytic use, unspecified with withdrawal, unspecified: Secondary | ICD-10-CM

## 2016-10-02 DIAGNOSIS — G4700 Insomnia, unspecified: Secondary | ICD-10-CM | POA: Diagnosis present

## 2016-10-02 DIAGNOSIS — Z9119 Patient's noncompliance with other medical treatment and regimen: Secondary | ICD-10-CM | POA: Diagnosis not present

## 2016-10-02 DIAGNOSIS — Z7902 Long term (current) use of antithrombotics/antiplatelets: Secondary | ICD-10-CM | POA: Diagnosis not present

## 2016-10-02 DIAGNOSIS — F419 Anxiety disorder, unspecified: Secondary | ICD-10-CM | POA: Diagnosis present

## 2016-10-02 DIAGNOSIS — R630 Anorexia: Secondary | ICD-10-CM | POA: Diagnosis present

## 2016-10-02 DIAGNOSIS — Z951 Presence of aortocoronary bypass graft: Secondary | ICD-10-CM

## 2016-10-02 DIAGNOSIS — Z79899 Other long term (current) drug therapy: Secondary | ICD-10-CM

## 2016-10-02 DIAGNOSIS — F333 Major depressive disorder, recurrent, severe with psychotic symptoms: Secondary | ICD-10-CM | POA: Diagnosis present

## 2016-10-02 DIAGNOSIS — F633 Trichotillomania: Secondary | ICD-10-CM

## 2016-10-02 DIAGNOSIS — I739 Peripheral vascular disease, unspecified: Secondary | ICD-10-CM | POA: Diagnosis present

## 2016-10-02 DIAGNOSIS — Z8249 Family history of ischemic heart disease and other diseases of the circulatory system: Secondary | ICD-10-CM

## 2016-10-02 DIAGNOSIS — I2510 Atherosclerotic heart disease of native coronary artery without angina pectoris: Secondary | ICD-10-CM | POA: Diagnosis not present

## 2016-10-02 DIAGNOSIS — E538 Deficiency of other specified B group vitamins: Secondary | ICD-10-CM | POA: Diagnosis present

## 2016-10-02 DIAGNOSIS — N189 Chronic kidney disease, unspecified: Secondary | ICD-10-CM | POA: Insufficient documentation

## 2016-10-02 DIAGNOSIS — Z85820 Personal history of malignant melanoma of skin: Secondary | ICD-10-CM | POA: Insufficient documentation

## 2016-10-02 DIAGNOSIS — R339 Retention of urine, unspecified: Secondary | ICD-10-CM | POA: Diagnosis present

## 2016-10-02 DIAGNOSIS — F29 Unspecified psychosis not due to a substance or known physiological condition: Secondary | ICD-10-CM

## 2016-10-02 DIAGNOSIS — Z955 Presence of coronary angioplasty implant and graft: Secondary | ICD-10-CM

## 2016-10-02 DIAGNOSIS — D519 Vitamin B12 deficiency anemia, unspecified: Secondary | ICD-10-CM | POA: Diagnosis present

## 2016-10-02 DIAGNOSIS — R079 Chest pain, unspecified: Secondary | ICD-10-CM

## 2016-10-02 LAB — COMPREHENSIVE METABOLIC PANEL
ALT: 29 U/L (ref 14–54)
AST: 29 U/L (ref 15–41)
Albumin: 4 g/dL (ref 3.5–5.0)
Alkaline Phosphatase: 76 U/L (ref 38–126)
Anion gap: 10 (ref 5–15)
BUN: 24 mg/dL — ABNORMAL HIGH (ref 6–20)
CO2: 24 mmol/L (ref 22–32)
CREATININE: 0.6 mg/dL (ref 0.44–1.00)
Calcium: 9.1 mg/dL (ref 8.9–10.3)
Chloride: 105 mmol/L (ref 101–111)
Glucose, Bld: 133 mg/dL — ABNORMAL HIGH (ref 65–99)
Potassium: 3.1 mmol/L — ABNORMAL LOW (ref 3.5–5.1)
Sodium: 139 mmol/L (ref 135–145)
TOTAL PROTEIN: 6.8 g/dL (ref 6.5–8.1)
Total Bilirubin: 1.7 mg/dL — ABNORMAL HIGH (ref 0.3–1.2)

## 2016-10-02 LAB — TROPONIN I
TROPONIN I: 0.03 ng/mL — AB (ref <0.03)
TROPONIN I: 0.03 ng/mL — AB (ref <0.03)

## 2016-10-02 LAB — CBC WITH DIFFERENTIAL/PLATELET
Basophils Absolute: 0 10*3/uL (ref 0–0.1)
Basophils Relative: 0 %
EOS PCT: 0 %
Eosinophils Absolute: 0 10*3/uL (ref 0–0.7)
HCT: 45.2 % (ref 35.0–47.0)
HEMOGLOBIN: 15.3 g/dL (ref 12.0–16.0)
LYMPHS ABS: 1.5 10*3/uL (ref 1.0–3.6)
LYMPHS PCT: 15 %
MCH: 33.6 pg (ref 26.0–34.0)
MCHC: 33.9 g/dL (ref 32.0–36.0)
MCV: 98.9 fL (ref 80.0–100.0)
Monocytes Absolute: 1 10*3/uL — ABNORMAL HIGH (ref 0.2–0.9)
Monocytes Relative: 10 %
NEUTROS PCT: 75 %
Neutro Abs: 7.7 10*3/uL — ABNORMAL HIGH (ref 1.4–6.5)
Platelets: 243 10*3/uL (ref 150–440)
RBC: 4.57 MIL/uL (ref 3.80–5.20)
RDW: 14.4 % (ref 11.5–14.5)
WBC: 10.3 10*3/uL (ref 3.6–11.0)

## 2016-10-02 LAB — ACETAMINOPHEN LEVEL: Acetaminophen (Tylenol), Serum: <10 ug/mL — ABNORMAL LOW (ref 10–30)

## 2016-10-02 LAB — SALICYLATE LEVEL

## 2016-10-02 LAB — ETHANOL: Alcohol, Ethyl (B): <5 mg/dL (ref <5)

## 2016-10-02 MED ORDER — ALPRAZOLAM 1 MG PO TABS
1.0000 mg | ORAL_TABLET | Freq: Three times a day (TID) | ORAL | Status: DC
  Administered 2016-10-03 – 2016-10-04 (×3): 1 mg via ORAL
  Filled 2016-10-02 (×4): qty 1

## 2016-10-02 MED ORDER — NITROGLYCERIN 0.4 MG SL SUBL
0.4000 mg | SUBLINGUAL_TABLET | SUBLINGUAL | Status: DC | PRN
  Administered 2016-10-14 (×3): 0.4 mg via SUBLINGUAL
  Filled 2016-10-02 (×2): qty 1

## 2016-10-02 MED ORDER — MOMETASONE FURO-FORMOTEROL FUM 200-5 MCG/ACT IN AERO: 2.0000 | INHALATION_SPRAY | Freq: Two times a day (BID) | RESPIRATORY_TRACT | Status: DC

## 2016-10-02 MED ORDER — LABETALOL HCL 5 MG/ML IV SOLN
10.0000 mg | Freq: Once | INTRAVENOUS | Status: AC
Start: 2016-10-02 — End: 2016-10-02
  Administered 2016-10-02: 10 mg via INTRAVENOUS

## 2016-10-02 MED ORDER — LORATADINE 10 MG PO TABS
10.0000 mg | ORAL_TABLET | Freq: Every day | ORAL | Status: DC
  Administered 2016-10-02: 10 mg via ORAL
  Filled 2016-10-02: qty 1

## 2016-10-02 MED ORDER — MOMETASONE FURO-FORMOTEROL FUM 200-5 MCG/ACT IN AERO
2.0000 | INHALATION_SPRAY | Freq: Two times a day (BID) | RESPIRATORY_TRACT | Status: DC
  Administered 2016-10-03 – 2016-10-20 (×15): 2 via RESPIRATORY_TRACT
  Filled 2016-10-02: qty 8.8

## 2016-10-02 MED ORDER — FLUOXETINE HCL 10 MG PO CAPS
10.0000 mg | ORAL_CAPSULE | Freq: Every day | ORAL | Status: DC
  Filled 2016-10-02: qty 1

## 2016-10-02 MED ORDER — LORAZEPAM 2 MG/ML IJ SOLN
1.0000 mg | Freq: Once | INTRAMUSCULAR | Status: AC
  Administered 2016-10-02: 1 mg via INTRAVENOUS

## 2016-10-02 MED ORDER — PANTOPRAZOLE SODIUM 40 MG PO TBEC
40.0000 mg | DELAYED_RELEASE_TABLET | Freq: Every day | ORAL | Status: DC
  Administered 2016-10-02: 40 mg via ORAL
  Filled 2016-10-02: qty 1

## 2016-10-02 MED ORDER — LORAZEPAM 2 MG/ML IJ SOLN
INTRAMUSCULAR | Status: AC
  Administered 2016-10-02: 1 mg via INTRAVENOUS
  Filled 2016-10-02: qty 1

## 2016-10-02 MED ORDER — ALPRAZOLAM 0.5 MG PO TABS
1.0000 mg | ORAL_TABLET | Freq: Three times a day (TID) | ORAL | Status: DC
  Administered 2016-10-02: 1 mg via ORAL
  Filled 2016-10-02: qty 2

## 2016-10-02 MED ORDER — MAGNESIUM HYDROXIDE 400 MG/5ML PO SUSP: 30.0000 mL | Freq: Every day | ORAL | Status: DC | PRN

## 2016-10-02 MED ORDER — CYCLOBENZAPRINE HCL 10 MG PO TABS: 10.0000 mg | ORAL_TABLET | Freq: Three times a day (TID) | ORAL | Status: DC | PRN

## 2016-10-02 MED ORDER — PANTOPRAZOLE SODIUM 40 MG PO TBEC
40.0000 mg | DELAYED_RELEASE_TABLET | Freq: Every day | ORAL | Status: DC
  Administered 2016-10-04 – 2016-10-10 (×4): 40 mg via ORAL
  Filled 2016-10-02 (×8): qty 1

## 2016-10-02 MED ORDER — TRAZODONE HCL 50 MG PO TABS
50.0000 mg | ORAL_TABLET | Freq: Every day | ORAL | Status: DC
  Administered 2016-10-02 – 2016-10-18 (×10): 50 mg via ORAL
  Filled 2016-10-02 (×16): qty 1

## 2016-10-02 MED ORDER — LABETALOL HCL 5 MG/ML IV SOLN
INTRAVENOUS | Status: AC
  Administered 2016-10-02: 10 mg via INTRAVENOUS
  Filled 2016-10-02: qty 4

## 2016-10-02 MED ORDER — CLOPIDOGREL BISULFATE 75 MG PO TABS
75.0000 mg | ORAL_TABLET | Freq: Every day | ORAL | Status: DC
Start: 2016-10-03 — End: 2016-10-20
  Administered 2016-10-04 – 2016-10-20 (×12): 75 mg via ORAL
  Filled 2016-10-02 (×16): qty 1

## 2016-10-02 MED ORDER — CLOPIDOGREL BISULFATE 75 MG PO TABS
75.0000 mg | ORAL_TABLET | Freq: Every day | ORAL | Status: DC
  Administered 2016-10-02: 75 mg via ORAL
  Filled 2016-10-02: qty 1

## 2016-10-02 MED ORDER — ATORVASTATIN CALCIUM 20 MG PO TABS
80.0000 mg | ORAL_TABLET | Freq: Every day | ORAL | Status: DC
Start: 2016-10-03 — End: 2016-10-20
  Administered 2016-10-03 – 2016-10-19 (×7): 80 mg via ORAL
  Filled 2016-10-02 (×10): qty 4

## 2016-10-02 MED ORDER — LORATADINE 10 MG PO TABS
10.0000 mg | ORAL_TABLET | Freq: Every day | ORAL | Status: DC
  Administered 2016-10-04: 10 mg via ORAL
  Filled 2016-10-02 (×2): qty 1

## 2016-10-02 MED ORDER — LISINOPRIL 20 MG PO TABS
20.0000 mg | ORAL_TABLET | Freq: Once | ORAL | Status: AC
  Administered 2016-10-02: 20 mg via ORAL
  Filled 2016-10-02: qty 1

## 2016-10-02 MED ORDER — FLUOXETINE HCL 10 MG PO CAPS
10.0000 mg | ORAL_CAPSULE | Freq: Every day | ORAL | Status: DC
  Administered 2016-10-02: 10 mg via ORAL
  Filled 2016-10-02: qty 1

## 2016-10-02 MED ORDER — ACETAMINOPHEN 325 MG PO TABS
650.0000 mg | ORAL_TABLET | Freq: Four times a day (QID) | ORAL | Status: DC | PRN
  Administered 2016-10-09 – 2016-10-20 (×4): 650 mg via ORAL
  Filled 2016-10-02 (×5): qty 2

## 2016-10-02 MED ORDER — ATORVASTATIN CALCIUM 20 MG PO TABS
80.0000 mg | ORAL_TABLET | Freq: Every day | ORAL | Status: DC
  Administered 2016-10-02: 80 mg via ORAL
  Filled 2016-10-02: qty 4

## 2016-10-02 MED ORDER — ALUM & MAG HYDROXIDE-SIMETH 200-200-20 MG/5ML PO SUSP: 30.0000 mL | ORAL | Status: DC | PRN

## 2016-10-02 MED ORDER — ASPIRIN 81 MG PO CHEW
324.0000 mg | CHEWABLE_TABLET | Freq: Once | ORAL | Status: AC
  Administered 2016-10-02: 324 mg via ORAL
  Filled 2016-10-02: qty 4

## 2016-10-02 MED ORDER — NITROGLYCERIN 0.4 MG SL SUBL
0.4000 mg | SUBLINGUAL_TABLET | SUBLINGUAL | Status: DC | PRN
  Administered 2016-10-02: 0.4 mg via SUBLINGUAL
  Filled 2016-10-02 (×2): qty 1

## 2016-10-02 MED ORDER — TRAZODONE HCL 50 MG PO TABS: 50.0000 mg | ORAL_TABLET | Freq: Every day | ORAL | Status: DC

## 2016-10-02 NOTE — BH Assessment (Signed)
Patient is to be admitted to [facility] by Dr. Weber Cooks.  Attending Physician will be Dr. Bary Leriche.   Patient has been assigned to room 323, by [place].   Intake Paper Work has been signed and placed on patient chart.  ER staff is aware of the admission Holley Raring, ER Sect.; Dr. Alfred Levins, ER MD; [place], Patient Access).

## 2016-10-02 NOTE — ED Notes (Signed)
Pt transported to CT with RN

## 2016-10-02 NOTE — ED Notes (Signed)
PT IVC PENDING CONSULT  

## 2016-10-02 NOTE — ED Notes (Signed)
Pt dressed out at this time, Kirsten Castro, TTS states bed downstairs will be ready around shift changed, pt  moved to quad until bed becomes available downstairs.

## 2016-10-02 NOTE — ED Notes (Signed)
Patient husband Elwood Kahana cell phone # [phone].

## 2016-10-02 NOTE — ED Triage Notes (Signed)
Patient comes from home via EMS for chest pain for the last 3-4 days. Per EMS patient has a hx of anxiety and bipolar. Per EMS husband states that patient has been acting irratic and not her self for about 2 weeks. Even pulling out her hair. Patient stated to myself and Katie RN that "this is a waste of time, I am dying." BP en route 230/110. 2 in of nitro paste to left chest by EMS.

## 2016-10-02 NOTE — ED Notes (Signed)
Pt A&Ox4, not repetitive as before. Pt answering questions at this time, asking for something to drink.

## 2016-10-02 NOTE — ED Notes (Signed)
Patient stated she is "full of water" offered restroom to patient, she declined.

## 2016-10-02 NOTE — BH Assessment (Signed)
Assessment Note  Kirsten Castro is an 56 y.o. female who presents to the ER via EMS due to her husband calling 911. Husband had concerns about her behaviors. According to ER notes, husband states she has been "erratic and not herself for about two weeks. Even pulling out her hair." Patient reported to nursing staff "this is a waste of time, I'm dying." She also reported to the ER MD, she's having thoughts of ending her life.  This is the patient's third visit to the ER, since November (2017), with similar presentation. Per the EHR, the patient was reporting swelling in her stomach because she was unable to urinate. It resulted a bladder scan and a Foley Catheter put in place. Patient continued to believe she was sick, even though results came back with no concerns. The other ER visit, the husband found her in their bathtub, confused and altered mental state.  During the interview, the patient was calm, cooperative and pleasant.  She was able to answer majority of the questions. She denied SIH/HI and AV/H with this Probation officer. However, she was giving medications prior to the writer talking with her. Which explains, why she was drowsy.   Diagnosis: Depression  Past Medical History:  Past Medical History:  Diagnosis Date  . Anemia   . Anginal pain ([place])   . Anxiety   . 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  This was done at [facility]  . BP (high blood pressure) 02/03/2016  . Cancer ([place])    melanoma skin cancer  . Carotid stenosis 02/10/2015  . Chronic kidney disease    UTI  . Colitis 12/15/2015  . Collagen  vascular disease ([place])   . COPD (chronic obstructive pulmonary disease) ([place])   . Coronary artery disease   . Depression   . Esophageal candidiasis ([place])   . Gastritis   . GERD (gastroesophageal reflux disease)   . GI bleed 12/15/2015  . Headache   . Hypertension   . Ischemic colitis ([place]) 01/01/2016   Overview:   SMA mesenteric ischemia now status post angioplasty by vascular of In stent restenosis.   Overview:  S?P SMA angioplasty   . Myocardial infarction   . Nausea and vomiting in adult   . Noninfectious diarrhea   . Peripheral vascular disease ([place]) 02/03/2016   Overview:  Follows with Dr. Delana Meyer    . Shortness of breath dyspnea    with exertion  . Stroke [facility])    TIA X 2  . Temporary cerebral vascular dysfunction 02/03/2016    Past Surgical History:  Procedure Laterality Date  . ABDOMINAL HYSTERECTOMY    . BACK SURGERY    . BREAST BIOPSY Left 6 2017   results not in yet  . COLONOSCOPY WITH PROPOFOL N/A 01/24/2016   Procedure: COLONOSCOPY WITH PROPOFOL;  Surgeon: Lucilla Lame, MD;  Location: [facility] ENDOSCOPY;  Service: Endoscopy;  Laterality: N/A;  . CORONARY ANGIOPLASTY    . CORONARY ARTERY BYPASS GRAFT    . ESOPHAGOGASTRODUODENOSCOPY (EGD) WITH PROPOFOL N/A 01/24/2016   Procedure: ESOPHAGOGASTRODUODENOSCOPY (EGD) WITH PROPOFOL;  Surgeon: Evangeline Gula  Allen Norris, MD;  Location: [facility] ENDOSCOPY;  Service: Endoscopy;  Laterality: N/A;  . KYPHOPLASTY N/A 02/08/2016   Procedure: KYPHOPLASTY L1;  Surgeon: Hessie Knows, MD;  Location: [facility] ORS;  Service: Orthopedics;  Laterality: N/A;  . KYPHOPLASTY N/A 03/28/2016   Procedure: KYPHOPLASTY;  Surgeon: Hessie Knows, MD;  Location: [facility] ORS;  Service: Orthopedics;  Laterality: N/A;  . NOSE SURGERY     cancer removal  . OTHER SURGICAL HISTORY  Jul 11 2014   sternum removal  . PERIPHERAL VASCULAR CATHETERIZATION N/A 02/10/2015   Procedure: Carotid PTA/Stent Intervention;  Surgeon: Katha Cabal, MD;  Location: [place] CV LAB;  Service:  Cardiovascular;  Laterality: N/A;  . PERIPHERAL VASCULAR CATHETERIZATION N/A 12/17/2015   Procedure: Visceral Angiography;  Surgeon: Katha Cabal, MD;  Location: [place] CV LAB;  Service: Cardiovascular;  Laterality: N/A;  . PERIPHERAL VASCULAR CATHETERIZATION N/A 12/17/2015   Procedure: Visceral Artery Intervention;  Surgeon: Katha Cabal, MD;  Location: [place] CV LAB;  Service: Cardiovascular;  Laterality: N/A;  . STENT PLACEMENT VASCULAR ([place] HX)      Family History:  Family History  Problem Relation Age of Onset  . Breast cancer Sister   . CAD Father   . CAD Brother     Social History:  reports that she quit smoking about 4 years ago. Her smoking use included Cigarettes. She smoked 1.00 pack per day. She has never used smokeless tobacco. She reports that she does not drink alcohol or use drugs.  Additional Social History:  Alcohol / Drug Use Pain Medications: See PTA Prescriptions: See PTA Over the Counter: See PTA History of alcohol / drug use?: No history of alcohol / drug abuse Longest period of sobriety (when/how long): Patient denies past and current use of mind altering substances. Negative Consequences of Use:  (n/a) Withdrawal Symptoms:  (n/a)  CIWA: CIWA-Ar BP: (!) 158/94 Pulse Rate: 81 COWS:    Allergies:  Allergies  Allergen Reactions  . Ferrous Sulfate Hives  . Sulfa Antibiotics     Home Medications:  (Not in a hospital admission)  OB/GYN Status:  No LMP recorded. Patient has had a hysterectomy.  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 : Yes Suicidal Intent: No Has patient had any suicidal intent within the past 6 months prior to admission? : No Is patient at risk for suicide?: No Suicidal Plan?: No Has patient had any suicidal plan within the past 6 months prior to admission? : No Access to Means: No What has been your use of drugs/alcohol within  the last 12 months?: Reports of none Previous Attempts/Gestures: No How many times?: 0 Other Self Harm Risks: Reports of none Triggers for Past Attempts: None known Intentional Self Injurious Behavior: None Family Suicide History: No Recent stressful life event(s): Other (Comment) (Unknown, patient was unable to identify any.) Persecutory voices/beliefs?: No Depression: Yes Depression Symptoms: Feeling worthless/self pity, Isolating, Feeling angry/irritable, Guilt Substance abuse history and/or treatment for substance abuse?: No Suicide prevention information given to non-admitted patients: Not applicable  Risk to Others within the past 6 months Homicidal Ideation: No Does patient have any lifetime risk of violence toward others beyond the six months prior to admission? : No Thoughts of Harm to Others: No Current Homicidal Intent: No Current Homicidal Plan: No Access to Homicidal Means: No Identified  Victim: Reports of none History of harm to others?: No Assessment of Violence: None Noted Violent Behavior Description: Reports of none Does patient have access to weapons?: No Criminal Charges Pending?: No Does patient have a court date: No Is patient on probation?: No  Psychosis Hallucinations: Visual (Per the ER staff and husband) Delusions: Persecutory, Somatic  Mental Status Report Appearance/Hygiene: Unremarkable Eye Contact: Fair Motor Activity: Other (Comment) (Patient laying in the bed) Speech: Soft Level of Consciousness: Drowsy Mood: Depressed, Sad, Pleasant Affect: Anxious, Appropriate to circumstance, Depressed, Sad Anxiety Level: Minimal Thought Processes: Coherent, Relevant Judgement: Unimpaired Orientation: Person, Place, Time, Appropriate for developmental age Obsessive Compulsive Thoughts/Behaviors: Minimal  Cognitive Functioning Concentration: Normal Memory: Recent Impaired, Remote Intact IQ: Average Insight: Poor Impulse Control: Fair Appetite: Fair Weight Loss: 0 Weight Gain: 0 Sleep: Decreased Total Hours of Sleep: 4 Vegetative Symptoms: None  ADLScreening [facility] Assessment Services) Patient's cognitive ability adequate to safely complete daily activities?: Yes Patient able to express need for assistance with ADLs?: Yes Independently performs ADLs?: Yes (appropriate for developmental age)  Prior Inpatient Therapy Prior Inpatient Therapy: No Prior Therapy Dates: Reports of none Prior Therapy Facilty/Provider(s): Reports of none Reason for Treatment: Reports of none  Prior Outpatient Therapy Prior Outpatient Therapy: No Prior Therapy Dates: Reports of none Prior Therapy Facilty/Provider(s): Reports of none Reason for Treatment: Reports of none Does patient have an ACCT team?: No Does patient have Intensive In-House Services?  : No Does patient have [facility] services? : No Does patient have P4CC services?: No  ADL Screening (condition at  time of admission) Patient's cognitive ability adequate to safely complete daily activities?: Yes Is the patient deaf or have difficulty hearing?: No Does the patient have difficulty seeing, even when wearing glasses/contacts?: No Does the patient have difficulty concentrating, remembering, or making decisions?: No Patient able to express need for assistance with ADLs?: Yes Does the patient have difficulty dressing or bathing?: No Independently performs ADLs?: Yes (appropriate for developmental age) Does the patient have difficulty walking or climbing stairs?: No Weakness of Legs: None Weakness of Arms/Hands: None  Home Assistive Devices/Equipment Home Assistive Devices/Equipment: None  Therapy Consults (therapy consults require a physician order) PT Evaluation Needed: No OT Evalulation Needed: No SLP Evaluation Needed: No Abuse/Neglect Assessment (Assessment to be complete while patient is alone) Physical Abuse: Denies Verbal Abuse: Denies Sexual Abuse: Denies Exploitation of patient/patient's resources: Denies Self-Neglect: Denies Values / Beliefs Cultural Requests During Hospitalization: None Spiritual Requests During Hospitalization: None Consults Spiritual Care Consult Needed: No Social Work Consult Needed: No      Additional Information 1:1 In Past 12 Months?: No CIRT Risk: No Elopement Risk: No Does patient have medical clearance?: Yes  Child/Adolescent Assessment Running Away Risk:  Denies (Patient is an adult)  Disposition:  Disposition Initial Assessment Completed for this Encounter: Yes Disposition of Patient: Other dispositions (ER MD Ordered Psych Consult)  On Site Evaluation by:   Reviewed with Physician:    Gunnar Fusi MS, LCAS, LPC, [place], CCSI Therapeutic Triage Specialist 10/02/2016 2:57 PM

## 2016-10-02 NOTE — ED Notes (Signed)
Per EMS if patient knows that she is being evaluated for psychiatric issues she will leave. Husband told EMS he wanted her evaluated for 24 hours.

## 2016-10-02 NOTE — ED Notes (Signed)
ED Provider at bedside. 

## 2016-10-02 NOTE — ED Notes (Signed)
MD Klapacs at bedside

## 2016-10-02 NOTE — ED Provider Notes (Signed)
[facility] Emergency Department Provider Note  ____________________________________________  Time seen: Approximately 11:24 AM  I have reviewed the triage vital signs and the nursing notes.   HISTORY  Chief Complaint Chest Pain  Level 5 caveat:  Portions of the history and physical were unable to be obtained due to confusion   HPI Kirsten Castro is a 56 y.o. female the history of CAD status post CABG and multiple stents, melanoma, hypertension, peripheral vascular disease, COPD, anxiety and depression who presents for evaluation of chest pain and altered mental status. According to the husband patient has been with erratic behavior, pulling her hair, talking about demons, refusing her meds and food. For the last 3-4 days she has been having intermittent sharp central chest pain associated with dizziness, diaphoresis, nausea or vomiting. This episodes last a few seconds at a time. According to patient's husband she's been very confused, seeing demons, hearing demons, talking about dying. Patient tells me she has not taken her meds for months because she feels like she is in her last breath and is close to dying. Patient endorses having thoughts of killing herself but no plan. She denies any prior history of suicidal ideation. Patient keeps repeating that being here is an waste of time because she is dying.   Past Medical History:  Diagnosis Date  . Anemia   . Anginal pain ([place])   . Anxiety   . 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  This was done at [facility]  . BP (high  blood pressure) 02/03/2016  . Cancer ([place])    melanoma skin cancer  . Carotid stenosis 02/10/2015  . Chronic kidney disease    UTI  . Colitis 12/15/2015  . Collagen vascular disease ([place])   . COPD (chronic obstructive pulmonary disease) ([place])   . Coronary artery disease   . Depression   . Esophageal candidiasis ([place])   . Gastritis   . GERD (gastroesophageal reflux disease)   . GI bleed 12/15/2015  . Headache   . Hypertension   . Ischemic colitis ([place]) 01/01/2016   Overview:   SMA mesenteric ischemia now status post angioplasty by vascular of In stent restenosis.   Overview:  S?P SMA angioplasty   . Myocardial infarction   . Nausea and vomiting in adult   . Noninfectious diarrhea   . Peripheral vascular disease ([place]) 02/03/2016   Overview:  Follows with Dr. Delana Meyer    . Shortness of breath dyspnea    with exertion  . Stroke [facility])    TIA X 2  . Temporary cerebral vascular dysfunction 02/03/2016    Patient Active Problem List   Diagnosis Date Noted  . 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 Benzodiazepine withdrawal ([place]) 10/02/2016  . Trichotillomania 10/02/2016  . Abdominal pain 04/19/2016  . Sepsis ([place]) 03/09/2016  . Urinary retention 03/09/2016  . CAD (coronary artery disease) 03/09/2016  . Frank hematuria 03/08/2016  . Temporary cerebral vascular dysfunction 02/03/2016  . Peripheral vascular disease ([place]) 02/03/2016  . BP (high blood pressure) 02/03/2016  .  HLD (hyperlipidemia) 02/03/2016  . Clinical depression 02/03/2016  . Carotid artery narrowing 02/03/2016  . 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 Anxiety 02/03/2016  . Abdominal pain, generalized   . Nausea with vomiting   . Gastritis   . Esophageal candidiasis ([place])   . Noninfectious diarrhea   . Intractable pain 01/21/2016  . Upper abdominal pain   . Nausea and vomiting in adult   . Ischemic colitis  ([place]) 01/01/2016  . Colitis 12/15/2015  . GI bleed 12/15/2015  . Acute inflammation of the pancreas 12/06/2015  . Vitamin B12 deficiency anemia 02/25/2015  . Absolute anemia 02/25/2015  . Carotid stenosis 02/10/2015  . Personal history of other specified conditions 11/27/2014  . Osteomyelitis ([place]) 11/27/2014  . H/O coronary artery bypass surgery 05/18/2014    Past Surgical History:  Procedure Laterality Date  . ABDOMINAL HYSTERECTOMY    . BACK SURGERY    . BREAST BIOPSY Left 6 2017   results not in yet  . COLONOSCOPY WITH PROPOFOL N/A 01/24/2016   Procedure: COLONOSCOPY WITH PROPOFOL;  Surgeon: Lucilla Lame, MD;  Location: [facility] ENDOSCOPY;  Service: Endoscopy;  Laterality: N/A;  . CORONARY ANGIOPLASTY    . CORONARY ARTERY BYPASS GRAFT    . ESOPHAGOGASTRODUODENOSCOPY (EGD) WITH PROPOFOL N/A 01/24/2016   Procedure: ESOPHAGOGASTRODUODENOSCOPY (EGD) WITH PROPOFOL;  Surgeon: Lucilla Lame, MD;  Location: [facility] ENDOSCOPY;  Service: Endoscopy;  Laterality: N/A;  . KYPHOPLASTY N/A 02/08/2016   Procedure: KYPHOPLASTY L1;  Surgeon: Hessie Knows, MD;  Location: [facility] ORS;  Service: Orthopedics;  Laterality: N/A;  . KYPHOPLASTY N/A 03/28/2016   Procedure: KYPHOPLASTY;  Surgeon: Hessie Knows, MD;  Location: [facility] ORS;  Service: Orthopedics;  Laterality: N/A;  . NOSE SURGERY     cancer removal  . OTHER SURGICAL HISTORY  Jul 11 2014   sternum removal  . PERIPHERAL VASCULAR CATHETERIZATION N/A 02/10/2015   Procedure: Carotid PTA/Stent Intervention;  Surgeon: Katha Cabal, MD;  Location: [place] CV LAB;  Service: Cardiovascular;  Laterality: N/A;  . PERIPHERAL VASCULAR CATHETERIZATION N/A 12/17/2015   Procedure: Visceral Angiography;  Surgeon: Katha Cabal, MD;  Location: [place] CV LAB;  Service: Cardiovascular;  Laterality: N/A;  . PERIPHERAL VASCULAR CATHETERIZATION N/A 12/17/2015   Procedure: Visceral Artery Intervention;  Surgeon: Katha Cabal, MD;  Location: [place] [facility]  LAB;  Service: Cardiovascular;  Laterality: N/A;  . STENT PLACEMENT VASCULAR ([place] [facility])      Prior to Admission medications   Medication Sig Start Date End Date Taking? Authorizing Provider  acetaminophen (TYLENOL) 500 MG tablet Take 1,000 mg by mouth every 6 (six) hours as needed for mild pain or headache.    Historical Provider, MD  ALPRAZolam Duanne Moron) 1 MG tablet Take 1 mg by mouth 3 (three) times daily as needed for anxiety or sleep.     Historical Provider, MD  atorvastatin (LIPITOR) 80 MG tablet Take 80 mg by mouth daily.    Historical Provider, MD  cetirizine (ZYRTEC) 10 MG tablet Take 10 mg by mouth daily. Reported on 02/08/2016    Historical Provider, MD  clopidogrel (PLAVIX) 75 MG tablet Take 75 mg by mouth daily.  02/02/16   Historical Provider, MD  clotrimazole (LOTRIMIN) 1 % cream APP EXT AA BID 12/02/15   Historical Provider, MD  clotrimazole-betamethasone (LOTRISONE) cream Apply topically.    Historical Provider, MD  cyclobenzaprine (FLEXERIL) 10 MG tablet Take 10 mg by mouth 3 (three) times daily  as needed for muscle spasms.     Historical Provider, MD  Fluticasone-Salmeterol (ADVAIR DISKUS) 250-50 MCG/DOSE AEPB Inhale 1 puff into the lungs 2 (two) times daily.     Historical Provider, MD  furosemide (LASIX) 20 MG tablet Take 20 mg by mouth.    Historical Provider, MD  hydrocortisone (ANUSOL-HC) 2.5 % rectal cream Apply topically.    Historical Provider, MD  lisinopril (ZESTRIL) 10 MG tablet Take 1 tablet (10 mg total) by mouth daily. 01/25/16   Henreitta Leber, MD  Magnesium 250 MG TABS Take 250 mg by mouth 2 (two) times daily.    Historical Provider, MD  ondansetron (ZOFRAN) 4 MG tablet Take 1 tablet (4 mg total) by mouth daily as needed for nausea or vomiting. 08/13/16   Earleen Newport, MD  ondansetron (ZOFRAN-ODT) 4 MG disintegrating tablet Take 4 mg by mouth every 8 (eight) hours as needed.  03/01/16   Historical Provider, MD  pantoprazole (PROTONIX) 40 MG tablet Take 1 tablet  (40 mg total) by mouth 2 (two) times daily. 01/25/16   Henreitta Leber, MD  phenazopyridine (PYRIDIUM) 100 MG tablet  12/19/13   Historical Provider, MD  polyethylene glycol powder (GLYCOLAX/MIRALAX) powder  01/09/14   Historical Provider, MD  potassium chloride (MICRO-K) 10 MEQ CR capsule Take 10 mEq by mouth 2 (two) times daily.     Historical Provider, MD  senna-docusate (SENOKOT-S) 8.6-50 MG tablet Take 1 tablet by mouth at bedtime as needed for mild constipation. 03/11/16   Nicholes Mango, MD  tamsulosin (FLOMAX) 0.4 MG CAPS capsule TAKE ONE CAPSULE BY MOUTH DAILY WITH BREAKFAST 07/13/14   Historical Provider, MD  traZODone (DESYREL) 50 MG tablet Take 50 mg by mouth at bedtime as needed.    Historical Provider, MD    Allergies Ferrous sulfate and Sulfa antibiotics  Family History  Problem Relation Age of Onset  . Breast cancer Sister   . CAD Father   . CAD Brother     Social History Social History  Substance Use Topics  . Smoking status: Former Smoker    Packs/day: 1.00    Types: Cigarettes    Quit date: 08/01/2012  . Smokeless tobacco: Never Used  . Alcohol use No    Review of Systems  Constitutional: Negative for fever. Eyes: Negative for visual changes. ENT: Negative for sore throat. Neck: No neck pain  Cardiovascular: + chest pain. Respiratory: +shortness of breath. Gastrointestinal: Negative for abdominal pain, vomiting or diarrhea. Genitourinary: Negative for dysuria. Musculoskeletal: Negative for back pain. Skin: Negative for rash. Neurological: Negative for headaches, weakness or numbness. Psych: No SI or HI. + AH and VH, depression  ____________________________________________   PHYSICAL EXAM:  VITAL SIGNS: ED Triage Vitals [10/02/16 1053]  Enc Vitals Group     BP (!) 214/106     Pulse Rate 97     Resp (!) 22     Temp 98.3 F (36.8 C)     Temp Source Oral     SpO2 96 %     Weight 150 lb (68 kg)     Height 5\' 1"  (1.549 m)     Head Circumference       Peak Flow      Pain Score 10     Pain Loc      Pain Edu?      Excl. in [place]?     Constitutional: Patient is disheveled, answers to some questions, poor eye contact, agitated and picking on the IVs and  cables HEENT:      Head: Normocephalic and atraumatic.         Eyes: Conjunctivae are normal. Sclera is non-icteric. EOMI. PERRL      Mouth/Throat: Mucous membranes are moist.       Neck: Supple with no signs of meningismus. Cardiovascular: Regular rate and rhythm. No murmurs, gallops, or rubs. 2+ symmetrical distal pulses are present in all extremities. No JVD. Respiratory: Normal respiratory effort. Lungs are clear to auscultation bilaterally. No wheezes, crackles, or rhonchi.  Gastrointestinal: Soft, non tender, and non distended with positive bowel sounds. No rebound or guarding. Musculoskeletal: Nontender with normal range of motion in all extremities. No edema, cyanosis, or erythema of extremities. Neurologic: Normal speech and language. Face is symmetric. Moving all extremities. No gross focal neurologic deficits are appreciated. Skin: Skin is warm, dry and intact. No rash noted. Psychiatric: Poor eye contact, disorganized, endorses auditory and visual hallucinations. Passive SI  ____________________________________________   LABS (all labs ordered are listed, but only abnormal results are displayed)  Labs Reviewed  CBC WITH DIFFERENTIAL/PLATELET - Abnormal; Notable for the following:       Result Value   Neutro Abs 7.7 (*)    Monocytes Absolute 1.0 (*)    All other components within normal limits  COMPREHENSIVE METABOLIC PANEL - Abnormal; Notable for the following:    Potassium 3.1 (*)    Glucose, Bld 133 (*)    BUN 24 (*)    Total Bilirubin 1.7 (*)    All other components within normal limits  TROPONIN I - Abnormal; Notable for the following:    Troponin I 0.03 (*)    All other components within normal limits  ACETAMINOPHEN LEVEL - Abnormal; Notable for the following:      Acetaminophen (Tylenol), Serum <10 (*)    All other components within normal limits  TROPONIN I - Abnormal; Notable for the following:    Troponin I 0.03 (*)    All other components within normal limits  ETHANOL  SALICYLATE LEVEL  URINE DRUG SCREEN, QUALITATIVE ([facility] ONLY)   ____________________________________________  EKG  ED ECG REPORT I, Rudene Re, the attending physician, personally viewed and interpreted this ECG.  Sinus tachycardia, rate of 101, normal intervals, normal axis, no ST elevations or depressions.   ____________________________________________  RADIOLOGY  Head CT: negative   CXR: Cardiomegaly. No acute cardiopulmonary process. Aortic atherosclerosis. ____________________________________________   PROCEDURES  Procedure(s) performed: None Procedures Critical Care performed:  None ____________________________________________   INITIAL IMPRESSION / ASSESSMENT AND PLAN / ED COURSE  56 y.o. female the history of CAD status post CABG and multiple stents, melanoma, hypertension, peripheral vascular disease, COPD, anxiety and depression who presents for evaluation of chest pain in the setting of elevated BP and noncompliance with meds and altered mental status with visual and auditory hallucinations, depression, and passive SI. Patient disorganized and keeps repeating she thinks she is dying. She is grossly neurologically intact. Will check head CT, labs, ethanol, drug screen to rule out organic causes of her psychosis. Will give nitro to lower BP, cycle troponin, EKG with no evidence of ischemia. Will give her home lisinopril. Patient connected to telemetry.   Clinical Course as of Oct 02 1499  Mon Oct 02, 2016  1459 CT of the head and blood work with no acute findings. Patient has been evaluated by psychiatry and will be admitted to their service.  [CV]    Clinical Course User Index [CV] Rudene Re, MD    Pertinent labs &  imaging results  that were available during my care of the patient were reviewed by me and considered in my medical decision making (see chart for details).    ____________________________________________   FINAL CLINICAL IMPRESSION(S) / ED DIAGNOSES  Final diagnoses:  Severe major depression with psychotic features (HCC)  Chest pain, unspecified type  Hypertension, unspecified type      NEW MEDICATIONS STARTED DURING THIS VISIT:  New Prescriptions   No medications on file     Note:  This document was prepared using Dragon voice recognition software and may include unintentional dictation errors.    Rudene Re, MD 10/02/16 [id number]

## 2016-10-02 NOTE — Consult Note (Signed)
[place] [facility] Consult   Reason for Consult:  Consult for 56 year old woman brought into the emergency room by her husband initially because of chest pain although later concerns were raised about her mental state Referring Physician:  Eual Fines Patient Identification: Kirsten Castro MRN:  [id number] Principal Diagnosis: Severe recurrent major depression with psychotic features [facility]) Diagnosis:   Patient Active Problem List   Diagnosis Date Noted  . Severe recurrent major depression with psychotic features ([place]) [F33.3] 10/02/2016  . Benzodiazepine withdrawal ([place]) [F13.239] 10/02/2016  . 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 Abdominal pain [R10.9] 04/19/2016  . Sepsis ([place]) [A41.9] 03/09/2016  . Urinary retention [R33.9] 03/09/2016  . CAD (coronary artery disease) [I25.10] 03/09/2016  . Frank hematuria [R31.0] 03/08/2016  . Temporary cerebral vascular dysfunction [G93.9] 02/03/2016  . Peripheral vascular disease ([place]) [I73.9] 02/03/2016  . BP (high blood pressure) [I10] 02/03/2016  . HLD (hyperlipidemia) [E78.5] 02/03/2016  . Clinical depression [F32.9] 02/03/2016  . Carotid artery narrowing [I65.29] 02/03/2016  . 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 Malignant neoplastic disease ([place]) [C80.1] 02/03/2016  . Anxiety [F41.9] 02/03/2016  . Abdominal pain, generalized [R10.84]   . Nausea with vomiting [R11.2]   . Gastritis [K29.70]   . Esophageal candidiasis ([place]) [B37.81]   . Noninfectious diarrhea [K52.9]   . Intractable pain [R52] 01/21/2016  . Upper abdominal pain [R10.10]   . Nausea and vomiting in adult [R11.2]   . Ischemic colitis ([place]) [K55.9] 01/01/2016  . Colitis [K52.9] 12/15/2015  . GI bleed [K92.2] 12/15/2015  . Acute inflammation of the pancreas [K85.90] 12/06/2015  . Vitamin B12 deficiency anemia [D51.9] 02/25/2015  . Absolute anemia [D64.9] 02/25/2015  . Carotid stenosis [I65.29] 02/10/2015  . Personal history  of other specified conditions [Z87.898] 11/27/2014  . Osteomyelitis ([place]) [M86.9] 11/27/2014  . H/O coronary artery bypass surgery [Z95.1] 05/18/2014    Total Time spent with patient: 1 hour  Subjective:   Kirsten Castro is a 56 y.o. female patient admitted with "I just haven't been feeling myself".  HPI:  Patient interviewed. Chart reviewed. Labs and vitals reviewed. 56 year old woman brought in by her husband because of her complaints of chest pain but also because of concerns about mental status change and confusion. Husband reported that the patient had not taken any of her medicine for about 3 days and seemed to be getting more confused. When she first came into the emergency room she was reportedly agitated and told and C room physician and nurses that she was seeing demons that were telling her to kill her self. She was given some Ativan to allow a CT scan to be performed prior to my seeing her. This seems to of made a large difference. When I saw the patient she was much calmer and able to have a fairly lucid conversation. Still she is complaining of depressed mood that has been worse for the last couple months. Feels sad a lot of the time. Feels like she and her husband don't get along. Very worried about her own health as well as about not being able to see her family up in [place]. Patient says she has a lot of trouble falling asleep. Hasn't been eating well. She did not tell me spontaneously that she had been seeing demons. I came back to ask her about it after talking to the emergency room doctor and the patient admitted that she had said that and that may be sometimes she thought that that was happening but  was not happening currently. She seemed to be backing off it quite a bit. She has consistently denied any intention to harm herself. Denies suicidal or homicidal thoughts. On the other hand she has stopped all of her medicine including her Xanax for the last 3 days and can't give any  reason for it. Patient does say that from time to time she just feels like she's blanking out. Admits that she's been pulling her hair out more. Eyes alcohol or drug abuse.  Social history: Lives with her husband. The patient is disabled but the husband still works. They are from [place] and the patient has a lot of family back there whom she would like to visit.  Medical history: Lots of cardiac problems with multiple stents. High blood pressure. Dyslipidemia. Past history of cancer. COPD  Substance abuse history: Denies any history of alcohol or drug abuse. She's been on Xanax 1 mg 3 times a day by her report for years.  Past Psychiatric History: She admits she tried to kill her self by overdose 1 time many years ago. Did not end up getting hospitalized. She says she was on antidepressants in the past and remembers Zoloft. She seems to link Zoloft possibly with the suicide attempt. Most recently has just been taking Xanax for years.  Risk to Self: Is patient at risk for suicide?: No Risk to Others:   Prior Inpatient Therapy:   Prior Outpatient Therapy:    Past Medical History:  Past Medical History:  Diagnosis Date  . Anemia   . Anginal pain ([place])   . Anxiety   . 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  This was done at [facility]  . BP (high blood pressure) 02/03/2016  . Cancer ([place])    melanoma skin cancer  . Carotid stenosis 02/10/2015  . Chronic kidney disease    UTI  . Colitis 12/15/2015  . Collagen vascular disease ([place])   . COPD (chronic obstructive pulmonary disease) ([place])   . Coronary  artery disease   . Depression   . Esophageal candidiasis ([place])   . Gastritis   . GERD (gastroesophageal reflux disease)   . GI bleed 12/15/2015  . Headache   . Hypertension   . Ischemic colitis ([place]) 01/01/2016   Overview:   SMA mesenteric ischemia now status post angioplasty by vascular of In stent restenosis.   Overview:  S?P SMA angioplasty   . Myocardial infarction   . Nausea and vomiting in adult   . Noninfectious diarrhea   . Peripheral vascular disease ([place]) 02/03/2016   Overview:  Follows with Dr. Delana Meyer    . Shortness of breath dyspnea    with exertion  . Stroke [facility])    TIA X 2  . Temporary cerebral vascular dysfunction 02/03/2016    Past Surgical History:  Procedure Laterality Date  . ABDOMINAL HYSTERECTOMY    . BACK SURGERY    . BREAST BIOPSY Left 6 2017   results not in yet  . COLONOSCOPY WITH PROPOFOL N/A 01/24/2016   Procedure: COLONOSCOPY WITH PROPOFOL;  Surgeon: Lucilla Lame, MD;  Location: [facility] ENDOSCOPY;  Service: Endoscopy;  Laterality: N/A;  . CORONARY ANGIOPLASTY    . CORONARY ARTERY BYPASS GRAFT    . ESOPHAGOGASTRODUODENOSCOPY (EGD) WITH PROPOFOL N/A 01/24/2016   Procedure: ESOPHAGOGASTRODUODENOSCOPY (EGD) WITH PROPOFOL;  Surgeon: Lucilla Lame, MD;  Location: [facility] ENDOSCOPY;  Service: Endoscopy;  Laterality: N/A;  . KYPHOPLASTY N/A 02/08/2016   Procedure: KYPHOPLASTY L1;  Surgeon: Hessie Knows, MD;  Location: [facility] ORS;  Service: Orthopedics;  Laterality: N/A;  . KYPHOPLASTY N/A 03/28/2016   Procedure: KYPHOPLASTY;  Surgeon: Hessie Knows, MD;  Location: [facility] ORS;  Service: Orthopedics;  Laterality: N/A;  . NOSE SURGERY     cancer removal  . OTHER SURGICAL HISTORY  Jul 11 2014   sternum removal  . PERIPHERAL VASCULAR CATHETERIZATION N/A 02/10/2015   Procedure: Carotid PTA/Stent Intervention;  Surgeon: Katha Cabal, MD;  Location: [place] CV LAB;  Service: Cardiovascular;  Laterality: N/A;  . PERIPHERAL VASCULAR CATHETERIZATION N/A 12/17/2015   Procedure:  Visceral Angiography;  Surgeon: Katha Cabal, MD;  Location: [place] CV LAB;  Service: Cardiovascular;  Laterality: N/A;  . PERIPHERAL VASCULAR CATHETERIZATION N/A 12/17/2015   Procedure: Visceral Artery Intervention;  Surgeon: Katha Cabal, MD;  Location: [place] CV LAB;  Service: Cardiovascular;  Laterality: N/A;  . STENT PLACEMENT VASCULAR ([place] HX)     Family History:  Family History  Problem Relation Age of Onset  . Breast cancer Sister   . CAD Father   . CAD Brother    Family Psychiatric  History: She does not know of any family history Social History:  History  Alcohol Use No     History  Drug Use No    Social History   Social History  . Marital status: Married    Spouse name: N/A  . Number of children: N/A  . Years of education: N/A   Social History Main Topics  . Smoking status: Former Smoker    Packs/day: 1.00    Types: Cigarettes    Quit date: 08/01/2012  . Smokeless tobacco: Never Used  . Alcohol use No  . Drug use: No  . Sexual activity: Yes   Other Topics Concern  . None   Social History Narrative  . None   Additional Social History:    Allergies:   Allergies  Allergen Reactions  . Ferrous Sulfate Hives  . Sulfa Antibiotics     Labs:  Results for orders placed or performed during the hospital encounter of 10/02/16 (from the past 48 hour(s))  CBC with Differential/Platelet     Status: Abnormal   Collection Time: 10/02/16 11:06 AM  Result Value Ref Range   WBC 10.3 3.6 - 11.0 K/uL   RBC 4.57 3.80 - 5.20 MIL/uL   Hemoglobin 15.3 12.0 - 16.0 g/dL   HCT 45.2 35.0 - 47.0 %   MCV 98.9 80.0 - 100.0 fL   MCH 33.6 26.0 - 34.0 pg   MCHC 33.9 32.0 - 36.0 g/dL   RDW 14.4 11.5 - 14.5 %   Platelets 243 150 - 440 K/uL   Neutrophils Relative % 75 %   Neutro Abs 7.7 (H) 1.4 - 6.5 K/uL   Lymphocytes Relative 15 %   Lymphs Abs 1.5 1.0 - 3.6 K/uL   Monocytes Relative 10 %   Monocytes Absolute 1.0 (H) 0.2 - 0.9 K/uL   Eosinophils  Relative 0 %   Eosinophils Absolute 0.0 0 - 0.7 K/uL   Basophils Relative 0 %   Basophils Absolute 0.0  0 - 0.1 K/uL  Comprehensive metabolic panel     Status: Abnormal   Collection Time: 10/02/16 11:06 AM  Result Value Ref Range   Sodium 139 135 - 145 mmol/L   Potassium 3.1 (L) 3.5 - 5.1 mmol/L   Chloride 105 101 - 111 mmol/L   CO2 24 22 - 32 mmol/L   Glucose, Bld 133 (H) 65 - 99 mg/dL   BUN 24 (H) 6 - 20 mg/dL   Creatinine, Ser 0.60 0.44 - 1.00 mg/dL   Calcium 9.1 8.9 - 10.3 mg/dL   Total Protein 6.8 6.5 - 8.1 g/dL   Albumin 4.0 3.5 - 5.0 g/dL   AST 29 15 - 41 U/L   ALT 29 14 - 54 U/L   Alkaline Phosphatase 76 38 - 126 U/L   Total Bilirubin 1.7 (H) 0.3 - 1.2 mg/dL   GFR calc non Af Amer >60 >60 mL/min   GFR calc Af Amer >60 >60 mL/min    Comment: (NOTE) The eGFR has been calculated using the CKD EPI equation. This calculation has not been validated in all clinical situations. eGFR's persistently <60 mL/min signify possible Chronic Kidney Disease.    Anion gap 10 5 - 15  Troponin I     Status: Abnormal   Collection Time: 10/02/16 11:06 AM  Result Value Ref Range   Troponin I 0.03 (HH) <0.03 ng/mL    Comment: CRITICAL RESULT CALLED TO, READ BACK BY AND VERIFIED WITH [place] LOYE ON 10/02/16 AT 1149 BY [facility]   Ethanol     Status: None   Collection Time: 10/02/16 11:06 AM  Result Value Ref Range   Alcohol, Ethyl (B) <5 <5 mg/dL    Comment:        LOWEST DETECTABLE LIMIT FOR SERUM ALCOHOL IS 5 mg/dL FOR MEDICAL PURPOSES ONLY   Acetaminophen level     Status: Abnormal   Collection Time: 10/02/16 11:06 AM  Result Value Ref Range   Acetaminophen (Tylenol), Serum <10 (L) 10 - 30 ug/mL    Comment:        THERAPEUTIC CONCENTRATIONS VARY SIGNIFICANTLY. A RANGE OF 10-30 ug/mL MAY BE AN EFFECTIVE CONCENTRATION FOR MANY PATIENTS. HOWEVER, SOME ARE BEST TREATED AT CONCENTRATIONS OUTSIDE THIS RANGE. ACETAMINOPHEN CONCENTRATIONS >150 ug/mL AT 4 HOURS AFTER INGESTION AND >50  ug/mL AT 12 HOURS AFTER INGESTION ARE OFTEN ASSOCIATED WITH TOXIC REACTIONS.   Salicylate level     Status: None   Collection Time: 10/02/16 11:06 AM  Result Value Ref Range   Salicylate Lvl <4.4 2.8 - 30.0 mg/dL    Current Facility-Administered Medications  Medication Dose Route Frequency Provider Last Rate Last Dose  . ALPRAZolam (XANAX) tablet 1 mg  1 mg Oral TID Gonzella Lex, MD      . atorvastatin (LIPITOR) tablet 80 mg  80 mg Oral q1800 Gonzella Lex, MD      . clopidogrel (PLAVIX) tablet 75 mg  75 mg Oral Daily Gonzella Lex, MD      . cyclobenzaprine (FLEXERIL) tablet 10 mg  10 mg Oral TID PRN Gonzella Lex, MD      . FLUoxetine (PROZAC) capsule 10 mg  10 mg Oral Daily John T Clapacs, MD      . lidocaine (XYLOCAINE) 2 % jelly 1 application  1 application Urethral Once Nickie Retort, MD      . loratadine (CLARITIN) tablet 10 mg  10 mg Oral Daily Gonzella Lex, MD      .  mometasone-formoterol (DULERA) 200-5 MCG/ACT inhaler 2 puff  2 puff Inhalation BID Gonzella Lex, MD      . nitroGLYCERIN (NITROSTAT) SL tablet 0.4 mg  0.4 mg Sublingual Q5 min PRN Rudene Re, MD   0.4 mg at 10/02/16 1135  . pantoprazole (PROTONIX) EC tablet 40 mg  40 mg Oral Daily Gonzella Lex, MD      . traZODone (DESYREL) tablet 50 mg  50 mg Oral QHS Gonzella Lex, MD       Current Outpatient Prescriptions  Medication Sig Dispense Refill  . acetaminophen (TYLENOL) 500 MG tablet Take 1,000 mg by mouth every 6 (six) hours as needed for mild pain or headache.    . ALPRAZolam (XANAX) 1 MG tablet Take 1 mg by mouth 3 (three) times daily as needed for anxiety or sleep.     Marland Kitchen atorvastatin (LIPITOR) 80 MG tablet Take 80 mg by mouth daily.    . cetirizine (ZYRTEC) 10 MG tablet Take 10 mg by mouth daily. Reported on 02/08/2016    . clopidogrel (PLAVIX) 75 MG tablet Take 75 mg by mouth daily.     . clotrimazole (LOTRIMIN) 1 % cream APP EXT AA BID    . clotrimazole-betamethasone (LOTRISONE) cream  Apply topically.    . cyclobenzaprine (FLEXERIL) 10 MG tablet Take 10 mg by mouth 3 (three) times daily as needed for muscle spasms.     . Fluticasone-Salmeterol (ADVAIR DISKUS) 250-50 MCG/DOSE AEPB Inhale 1 puff into the lungs 2 (two) times daily.     . furosemide (LASIX) 20 MG tablet Take 20 mg by mouth.    . hydrocortisone (ANUSOL-HC) 2.5 % rectal cream Apply topically.    Marland Kitchen lisinopril (ZESTRIL) 10 MG tablet Take 1 tablet (10 mg total) by mouth daily. 60 tablet 1  . Magnesium 250 MG TABS Take 250 mg by mouth 2 (two) times daily.    . ondansetron (ZOFRAN) 4 MG tablet Take 1 tablet (4 mg total) by mouth daily as needed for nausea or vomiting. 20 tablet 1  . ondansetron (ZOFRAN-ODT) 4 MG disintegrating tablet Take 4 mg by mouth every 8 (eight) hours as needed.     . pantoprazole (PROTONIX) 40 MG tablet Take 1 tablet (40 mg total) by mouth 2 (two) times daily. 60 tablet 1  . phenazopyridine (PYRIDIUM) 100 MG tablet     . polyethylene glycol powder (GLYCOLAX/MIRALAX) powder     . potassium chloride (MICRO-K) 10 MEQ CR capsule Take 10 mEq by mouth 2 (two) times daily.     Marland Kitchen senna-docusate (SENOKOT-S) 8.6-50 MG tablet Take 1 tablet by mouth at bedtime as needed for mild constipation.    . tamsulosin (FLOMAX) 0.4 MG CAPS capsule TAKE ONE CAPSULE BY MOUTH DAILY WITH BREAKFAST    . traZODone (DESYREL) 50 MG tablet Take 50 mg by mouth at bedtime as needed.      Musculoskeletal: Strength & Muscle Tone: within normal limits Gait & Station: normal Patient leans: N/A  Psychiatric Specialty Exam: Physical Exam  Nursing note and vitals reviewed. Constitutional: She appears well-developed and well-nourished.  HENT:  Head: Normocephalic and atraumatic.  Eyes: Conjunctivae are normal. Pupils are equal, round, and reactive to light.  Neck: Normal range of motion.  Cardiovascular: Regular rhythm and normal heart sounds.   Respiratory: Effort normal. No respiratory distress.  GI: Soft.   Musculoskeletal: Normal range of motion.  Neurological: She is alert.  Skin: Skin is warm and dry.  Psychiatric: Her mood appears anxious. Her speech  is delayed and tangential. She is withdrawn. Cognition and memory are impaired. She expresses impulsivity. She exhibits a depressed mood. She expresses no homicidal and no suicidal ideation.    Review of Systems  Constitutional: Negative.   HENT: Negative.   Eyes: Negative.   Respiratory: Negative.   Cardiovascular: Negative.   Gastrointestinal: Negative.   Musculoskeletal: Negative.   Skin: Negative.   Neurological: Negative.   Psychiatric/Behavioral: Positive for depression and hallucinations. Negative for memory loss, substance abuse and suicidal ideas. The patient is nervous/anxious and has insomnia.     Blood pressure (!) 160/86, pulse 82, temperature 98.3 F (36.8 C), temperature source Oral, resp. rate 18, height _0  (1.549 m), weight 68 kg (150 lb), SpO2 99 %.Body mass index is 28.34 kg/m.  General Appearance: Casual  Eye Contact:  Minimal  Speech:  Slow  Volume:  Decreased  Mood:  Anxious and Depressed  Affect:  Congruent  Thought Process:  Goal Directed  Orientation:  Full (Time, Place, and Person)  Thought Content:  Tangential  Suicidal Thoughts:  No  Homicidal Thoughts:  No  Memory:  Immediate;   Good Recent;   Fair Remote;   Fair  Judgement:  Impaired  Insight:  Lacking  Psychomotor Activity:  Restlessness  Concentration:  Concentration: Poor  Recall:  [employer] of Knowledge:  Fair  Language:  Fair  Akathisia:  No  Handed:  Right  AIMS (if indicated):     Assets:  Communication Skills Desire for Improvement Housing Social Support  ADL's:  Intact  Cognition:  WNL  Sleep:        Treatment Plan Summary: Daily contact with patient to assess and evaluate symptoms and progress in treatment, Medication management and Plan After reassessment of the patient and discussion with the emergency room physician  I think the wisest thing would be to admit the patient to psychiatry. We are awaiting her troponins to come back. If she does not require admission to the medical ward I will perceived with psychiatric admission. Case reviewed with TTS. I have continued her on the Xanax that she takes outpatient and started her on a low dose of fluoxetine for depression and anxiety. Patient aware of the plan.  Disposition: Recommend psychiatric Inpatient admission when medically cleared. Supportive therapy provided about ongoing stressors.  Alethia Berthold, MD 10/02/2016 2:11 PM

## 2016-10-02 NOTE — ED Notes (Signed)
Pt given dinner tray.

## 2016-10-02 NOTE — ED Notes (Signed)
Pt sleeping, VS stable. NSR (HR 79) on monitor

## 2016-10-02 NOTE — ED Notes (Signed)
Every time I ask patient a question and even registration patient states, "this is a waste of time." and she does not answer the question asked.

## 2016-10-02 NOTE — ED Notes (Signed)
Husband at bedside.  

## 2016-10-03 DIAGNOSIS — F333 Major depressive disorder, recurrent, severe with psychotic symptoms: Principal | ICD-10-CM

## 2016-10-03 LAB — LIPID PANEL
CHOL/HDL RATIO: 2.5 ratio
Cholesterol: 134 mg/dL (ref 0–200)
HDL: 53 mg/dL (ref >40)
LDL Cholesterol: 63 mg/dL (ref 0–99)
Triglycerides: 88 mg/dL (ref <150)
VLDL: 18 mg/dL (ref 0–40)

## 2016-10-03 LAB — VITAMIN B12: VITAMIN B 12: 343 pg/mL (ref 180–914)

## 2016-10-03 LAB — TSH: TSH: 0.55 u[IU]/mL (ref 0.350–4.500)

## 2016-10-03 MED ORDER — LORAZEPAM 2 MG/ML IJ SOLN
2.0000 mg | Freq: Four times a day (QID) | INTRAMUSCULAR | Status: DC | PRN
  Administered 2016-10-03 – 2016-10-07 (×3): 2 mg via INTRAMUSCULAR
  Filled 2016-10-03 (×3): qty 1

## 2016-10-03 MED ORDER — POTASSIUM CHLORIDE CRYS ER 20 MEQ PO TBCR
10.0000 meq | EXTENDED_RELEASE_TABLET | Freq: Every day | ORAL | Status: DC
  Administered 2016-10-04 – 2016-10-20 (×11): 10 meq via ORAL
  Filled 2016-10-03 (×15): qty 1

## 2016-10-03 MED ORDER — FLUVOXAMINE MALEATE 50 MG PO TABS
50.0000 mg | ORAL_TABLET | Freq: Every day | ORAL | Status: DC
  Administered 2016-10-03: 50 mg via ORAL
  Filled 2016-10-03: qty 1

## 2016-10-03 MED ORDER — TAMSULOSIN HCL 0.4 MG PO CAPS
0.4000 mg | ORAL_CAPSULE | Freq: Every day | ORAL | Status: DC
  Administered 2016-10-04 – 2016-10-20 (×11): 0.4 mg via ORAL
  Filled 2016-10-03 (×15): qty 1

## 2016-10-03 MED ORDER — LORAZEPAM 2 MG PO TABS
2.0000 mg | ORAL_TABLET | Freq: Four times a day (QID) | ORAL | Status: DC | PRN
  Administered 2016-10-08 (×2): 2 mg via ORAL
  Filled 2016-10-03 (×3): qty 1

## 2016-10-03 MED ORDER — DIPHENHYDRAMINE HCL 25 MG PO CAPS: 50.0000 mg | ORAL_CAPSULE | Freq: Four times a day (QID) | ORAL | Status: DC | PRN

## 2016-10-03 MED ORDER — OLANZAPINE 5 MG PO TBDP: 5.0000 mg | ORAL_TABLET | Freq: Once | ORAL | Status: DC

## 2016-10-03 MED ORDER — DIPHENHYDRAMINE HCL 50 MG/ML IJ SOLN
50.0000 mg | Freq: Four times a day (QID) | INTRAMUSCULAR | Status: DC | PRN
Start: 2016-10-03 — End: 2016-10-12
  Administered 2016-10-03 – 2016-10-05 (×2): 50 mg via INTRAMUSCULAR
  Filled 2016-10-03 (×5): qty 1

## 2016-10-03 MED ORDER — HALOPERIDOL 5 MG PO TABS
5.0000 mg | ORAL_TABLET | Freq: Four times a day (QID) | ORAL | Status: DC | PRN
  Filled 2016-10-03: qty 1

## 2016-10-03 MED ORDER — OLANZAPINE 5 MG PO TBDP
5.0000 mg | ORAL_TABLET | Freq: Two times a day (BID) | ORAL | Status: DC
  Administered 2016-10-03 (×2): 5 mg via ORAL
  Filled 2016-10-03: qty 1

## 2016-10-03 MED ORDER — LISINOPRIL 10 MG PO TABS: 10.0000 mg | ORAL_TABLET | Freq: Every day | ORAL | Status: DC

## 2016-10-03 MED ORDER — FUROSEMIDE 20 MG PO TABS
20.0000 mg | ORAL_TABLET | Freq: Every day | ORAL | Status: DC
  Administered 2016-10-04 – 2016-10-20 (×11): 20 mg via ORAL
  Filled 2016-10-03 (×15): qty 1

## 2016-10-03 MED ORDER — HALOPERIDOL LACTATE 5 MG/ML IJ SOLN
5.0000 mg | Freq: Four times a day (QID) | INTRAMUSCULAR | Status: DC | PRN
  Administered 2016-10-03 – 2016-10-07 (×3): 5 mg via INTRAMUSCULAR
  Filled 2016-10-03 (×3): qty 1

## 2016-10-03 NOTE — Progress Notes (Signed)
Patient psychotic and delusional. Continues to report I have water everywhere, pt continues to take off her clothes and wander in hallway and in other patients rooms grunting and growling. Pt refused medications, and becoming more agitated and aggressive.  Md notified, prn medications ordered and given. Pt yelling at staff, did not want to take medications, but then agreed. Prn given with partial relief.   Encouragement and support offered. Encouraged patient to take medications and to go to group.  Pt needing constant redirection this am, but resting currently. Pt remains safe on unit with q 15 min checks.

## 2016-10-03 NOTE — Progress Notes (Signed)
Patient ID: Kirsten Castro, female   DOB: 1961-06-24, 56 y.o.   MRN: [id number] Awoke, came by the nurses' station, redirected back to bed with a female staff; began to sob and crying profusely, holding to the wall, "I want to go home to my husband, I just want to go home ..." Reassured that she can only be discharged by the Dr.

## 2016-10-03 NOTE — Progress Notes (Signed)
Patient ID: Kirsten Castro, female   DOB: 05-Aug-1961, 56 y.o.   MRN: [id number] Anxious, jittery, worried, restless, diaper provided, patient continues to say, "all I feel is water, I don't feel anything down there, all I feel is water .Marland Kitchen..." Patient according to her chart c/o stomach swelling that required a bladder scan and a foley catheter put in place; unable to determin amount of urine retention; when I asked if she voided, she said "yes".

## 2016-10-03 NOTE — Progress Notes (Signed)
Patient ID: Kirsten Castro, female   DOB: 12-14-1960, 56 y.o.   MRN: [id number] Kirsten Castro is a 56 yo WF BIB her husband ER initially because of chest pain; admitted today to BMU for "Severe recurrent MDD w/PF .... AV/Hallucinations Seeing and hearing demons telling her to kill herself." Patient has h/o multiple complex medical problems, stopped taking medications, refused to eat, pulling out her hair (TrichoTilloMania - TTM). Denied any SI/HI, she denied AV/H. Calm and cooperative during the assessment, thoughts were initially organized and coherent until she tried to call her husband and was not happy to hear the voice message that he was not available. She became angry, anxious, she got up and started to rub her stomach, scratching her head.  "he is trying to get rid of me! This was intended and purposeful!"   Bilateral calves and feet tattoos (4 sites) noted during the skin assessment, other than the tattoos, skin is grossly intact. No contrabands found on patient and in her belongings; assessment complete with a female nursing staff.  Patient was oriented to the unit, Trazodone 50 mg given as scheduled before she returned to her assigned room.

## 2016-10-03 NOTE — [facility] Counselor (Signed)
10/03/16 1345 CSW attempted PSA, but pt presented as too acute, as evidenced by RN report.  CSW attempted to speak with the pt, however she was disrobed.  She asked CSW to "wait a minute", which CSW did, however, after 10 minutes, pt remained undressed in her bathroom. Lurline Idol, LCSW

## 2016-10-03 NOTE — Progress Notes (Signed)
D: Patient is very disorganized and anxious. Repeatingly states "I'm full of water". States she has thoughts of hurting herself because she's full of water. Patient gets irritable and agitated at times.  A: Medication given with much encouragement. Stated we were ruining her lives with the medication.  R: Redirection needed at time. Safety maintained with 15 min checks.

## 2016-10-03 NOTE — Progress Notes (Signed)
Patient ID: Kirsten Castro, female   DOB: 21-Aug-1961, 56 y.o.   MRN: [id number]  Kirsten Castro remains anxious, paranoid and distraught. She refuses any medications. Will start forced medication process.

## 2016-10-03 NOTE — [facility] Group Notes (Signed)
Goals Group  Date/Time: 10/03/2016 9am  Type of Therapy and Topic: Group Therapy: Goals Group: SMART Goals   Pt was called, but did not attend   Kirsten Castro. Dandrae Kustra, LCSWA, LCAS

## 2016-10-03 NOTE — Progress Notes (Signed)
[facility] Second Physician Opinion Progress Note for Medication Administration to Non-consenting Patients (For Involuntarily Committed Patients)  Patient: Kirsten Castro Date of Birth: [id number] MRN: [id number]  Reason for the Medication: The patient, without the benefit of the specific treatment measure, is incapable of participating in any available treatment plan that will give the patient a realistic opportunity of improving the patient's condition. There is, without the benefit of the specific treatment measure, a significant possibility that the patient will harm self or others before improvement of the patient's condition is realized.  Consideration of Side Effects: Consideration of the side effects related to the medication plan has been given.  Rationale for Medication Administration: Easily agitated. She is in distress. Grunting and pacing the hallways. Currently psychotic    Hildred Priest, MD 10/03/16  11:43 AM   This documentation is good for (7) seven days from the date of the MD signature. New documentation must be completed every seven (7) days with detailed justification in the medical record if the patient requires continued non-emergent administration of psychotropic medications.

## 2016-10-03 NOTE — Plan of Care (Signed)
Problem: Activity: Goal: Sleeping patterns will improve Outcome: Progressing Patient slept for Estimated Hours of 5.15; safety maintained, no injury or falls during this shift.

## 2016-10-03 NOTE — Plan of Care (Signed)
Problem: Safety: Goal: Ability to disclose and discuss suicidal ideas will improve Outcome: Progressing Patient verbalizes SI but contracts for safety.

## 2016-10-03 NOTE — [facility] Group Notes (Signed)
[place] LCSW Group Therapy   10/03/2016  9:30 AM  Type of Therapy: Group Therapy   Participation Level: Did Not Attend. Patient invited to participate but declined.    Alphonse Guild. Wai Minotti, MSW, LCSWA, LCAS

## 2016-10-03 NOTE — Plan of Care (Signed)
Problem: Coping: Goal: Ability to cope will improve Outcome: Not Progressing Pt psychotic, delusional

## 2016-10-03 NOTE — H&P (Signed)
Psychiatric Admission Assessment Adult  Patient Identification: Kirsten Castro MRN:  [id number] Date of Evaluation:  10/03/2016 Chief Complaint:  Depression Principal Diagnosis: Severe recurrent major depression with psychotic features [facility]) Diagnosis:   Patient Active Problem List   Diagnosis Date Noted  . Severe recurrent major depression with psychotic features ([place]) [F33.3] 10/02/2016  . Benzodiazepine withdrawal ([place]) [F13.239] 10/02/2016  . 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 Abdominal pain [R10.9] 04/19/2016  . Sepsis ([place]) [A41.9] 03/09/2016  . Urinary retention [R33.9] 03/09/2016  . CAD (coronary artery disease) [I25.10] 03/09/2016  . Frank hematuria [R31.0] 03/08/2016  . Temporary cerebral vascular dysfunction [G93.9] 02/03/2016  . Peripheral vascular disease ([place]) [I73.9] 02/03/2016  . BP (high blood pressure) [I10] 02/03/2016  . HLD (hyperlipidemia) [E78.5] 02/03/2016  . Clinical depression [F32.9] 02/03/2016  . Carotid artery narrowing [I65.29] 02/03/2016  . 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 Malignant neoplastic disease ([place]) [C80.1] 02/03/2016  . Anxiety [F41.9] 02/03/2016  . Abdominal pain, generalized [R10.84]   . Nausea with vomiting [R11.2]   . Gastritis [K29.70]   . Esophageal candidiasis ([place]) [B37.81]   . Noninfectious diarrhea [K52.9]   . Intractable pain [R52] 01/21/2016  . Upper abdominal pain [R10.10]   . Nausea and vomiting in adult [R11.2]   . Ischemic colitis ([place]) [K55.9] 01/01/2016  . Colitis [K52.9] 12/15/2015  . GI bleed [K92.2] 12/15/2015  . Acute inflammation of the pancreas [K85.90] 12/06/2015  . Vitamin B12 deficiency anemia [D51.9] 02/25/2015  . Absolute anemia [D64.9] 02/25/2015  . Carotid stenosis [I65.29] 02/10/2015  . Personal history of other specified conditions [Z87.898] 11/27/2014  . Osteomyelitis ([place]) [M86.9] 11/27/2014  . H/O coronary artery bypass surgery [Z95.1] 05/18/2014    History of Present Illness:   Identifying data. Kirsten Castro is a 56 year old female with a history of depression.  Chief complaint. "I have more water in me than food."  History of present illness. Information was obtained from the patient and the chart. The patient herself is unable to provide much information. She is very disorganized and anxious.. She standing in the hallway grunting and panting. She comes to my office but is unable to sit down. She wants to go home and wants to call her husband right away. She is looking constantly at her body parts: her feet, her arms, her abdomen and complaining of too much water. The patient does have a history of coronary artery hypertension but her chest x-ray was normal yesterday. There is no EKG on admission but we will order that. Her blood pressure is slightly elevated with normal heart rate. The patient was not given lisinopril and furosemide in the emergency room yesterday, which may be responsible for some of her complaints. She believes that something is stuck in her throat she therefore is unable to drink or eat. She refused all her pills today. The patient denies any symptoms of depression, anxiety, or psychosis. According to her notes from the emergency room the patient was brought to the hospital by her husband who noticed confusion and changes in her behavior, including hair pulling, for the past 2 weeks. In the emergency room, she was paranoid, delusional, hallucinating, seeing demons. She also expressed suicidal thoughts. She sounded hopeless feeling that nothing would help and that she was dying. Reportedly she stopped taking her medications 3 days ago. This includes Xanax 1 mg three times a day that she has been taking for extended period of time. It was thought that some of her symptoms may  be related to benzodiazepine withdrawal as she was restarted on Xanax which she refused this morning. The patient denies, and there is no evidence, that she  uses alcohol or substances.  While in the emergency room. The patient was given a dose of Ativan before head CT scan and apparently was able to participate in the interview with Dr. Weber Cooks. She reported depressed mood for several months with poor sleep and decreased appetite. She also talked to the doctor about demons.  Past psychiatry history. The patient has a long history of depression and has been treated with multiple antidepressants but remembers only Zoloft. She's been on the Xanax for years. There is one suicide attempt by overdose many years ago for which she was not hospitalized.  Family psychiatric history. Nonreported.  Social history. She is disabled from multiple medical problems. She lives with her husband who is still unemployed. They are originally from [place] where most of her family still resides. The patient has been upset that she has not been able to visit with them.  Total Time spent with patient: 1 hour  Is the patient at risk to self? Yes.    Has the patient been a risk to self in the past 6 months? No.  Has the patient been a risk to self within the distant past? Yes.    Is the patient a risk to others? No.  Has the patient been a risk to others in the past 6 months? No.  Has the patient been a risk to others within the distant past? No.   Prior Inpatient Therapy:   Prior Outpatient Therapy:    Alcohol Screening: 1. How often do you have a drink containing alcohol?: Never 2. How many drinks containing alcohol do you have on a typical day when you are drinking?: 1 or 2 3. How often do you have six or more drinks on one occasion?: Never Preliminary Score: 0 4. How often during the last year have you found that you were not able to stop drinking once you had started?: Never 5. How often during the last year have you failed to do what was normally expected from you becasue of drinking?: Never 6. How often during the last year have you needed a first drink in the  morning to get yourself going after a heavy drinking session?: Never 7. How often during the last year have you had a feeling of guilt of remorse after drinking?: Never 8. How often during the last year have you been unable to remember what happened the night before because you had been drinking?: Never 9. Have you or someone else been injured as a result of your drinking?: No 10. Has a relative or friend or a doctor or another health worker been concerned about your drinking or suggested you cut down?: No Alcohol Use Disorder Identification Test Final Score (AUDIT): 0 Substance Abuse History in the last 12 months:  No. Consequences of Substance Abuse: NA Previous Psychotropic Medications: Yes  Psychological Evaluations: No  Past Medical History:  Past Medical History:  Diagnosis Date  . Anemia   . Anginal pain ([place])   . Anxiety   . 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  This was done at [facility]  . BP (high blood pressure) 02/03/2016  . Cancer ([place])    melanoma skin cancer  . Carotid stenosis 02/10/2015  . Chronic kidney disease    UTI  . Colitis 12/15/2015  . Collagen vascular disease ([place])   . COPD (chronic obstructive pulmonary disease) ([place])   . Coronary artery disease   . Depression   . Esophageal candidiasis ([place])   . Gastritis   . GERD (gastroesophageal reflux disease)   . GI bleed 12/15/2015  . Headache   . Hypertension   . Ischemic colitis ([place]) 01/01/2016   Overview:   SMA mesenteric ischemia now status post angioplasty by vascular of In stent restenosis.   Overview:  S?P SMA  angioplasty   . Myocardial infarction   . Nausea and vomiting in adult   . Noninfectious diarrhea   . Peripheral vascular disease ([place]) 02/03/2016   Overview:  Follows with Dr. Delana Meyer    . Shortness of breath dyspnea    with exertion  . Stroke [facility])    TIA X 2  . Temporary cerebral vascular dysfunction 02/03/2016    Past Surgical History:  Procedure Laterality Date  . ABDOMINAL HYSTERECTOMY    . BACK SURGERY    . BREAST BIOPSY Left 6 2017   results not in yet  . COLONOSCOPY WITH PROPOFOL N/A 01/24/2016   Procedure: COLONOSCOPY WITH PROPOFOL;  Surgeon: Lucilla Lame, MD;  Location: [facility] ENDOSCOPY;  Service: Endoscopy;  Laterality: N/A;  . CORONARY ANGIOPLASTY    . CORONARY ARTERY BYPASS GRAFT    . ESOPHAGOGASTRODUODENOSCOPY (EGD) WITH PROPOFOL N/A 01/24/2016   Procedure: ESOPHAGOGASTRODUODENOSCOPY (EGD) WITH PROPOFOL;  Surgeon: Lucilla Lame, MD;  Location: [facility] ENDOSCOPY;  Service: Endoscopy;  Laterality: N/A;  . KYPHOPLASTY N/A 02/08/2016   Procedure: KYPHOPLASTY L1;  Surgeon: Hessie Knows, MD;  Location: [facility] ORS;  Service: Orthopedics;  Laterality: N/A;  . KYPHOPLASTY N/A 03/28/2016   Procedure: KYPHOPLASTY;  Surgeon: Hessie Knows, MD;  Location: [facility] ORS;  Service: Orthopedics;  Laterality: N/A;  . NOSE SURGERY     cancer removal  . OTHER SURGICAL HISTORY  Jul 11 2014   sternum removal  . PERIPHERAL VASCULAR CATHETERIZATION N/A 02/10/2015   Procedure: Carotid PTA/Stent Intervention;  Surgeon: Katha Cabal, MD;  Location: [place] CV LAB;  Service: Cardiovascular;  Laterality: N/A;  . PERIPHERAL VASCULAR CATHETERIZATION N/A 12/17/2015   Procedure: Visceral Angiography;  Surgeon: Katha Cabal, MD;  Location: [place] CV LAB;  Service: Cardiovascular;  Laterality: N/A;  . PERIPHERAL VASCULAR CATHETERIZATION N/A 12/17/2015   Procedure: Visceral Artery Intervention;  Surgeon: Katha Cabal, MD;  Location: [place] [facility] LAB;  Service: Cardiovascular;  Laterality: N/A;   . STENT PLACEMENT VASCULAR ([place] [facility])     Family History:  Family History  Problem Relation Age of Onset  . Breast cancer Sister   . CAD Father   . CAD Brother    Tobacco Screening: Have you used any form of tobacco in the last 30 days? (Cigarettes, Smokeless Tobacco, Cigars, and/or Pipes): No Social History:  History  Alcohol Use No     History  Drug Use No    Additional Social History:                           Allergies:  Allergies  Allergen Reactions  . Ferrous Sulfate Hives  . Sulfa Antibiotics    Lab Results:  Results for orders placed or performed during the hospital encounter of 10/02/16 (from the past 48 hour(s))  Lipid panel     Status: None   Collection Time: 10/03/16  6:20 AM  Result Value Ref Range   Cholesterol 134 0 - 200 mg/dL   Triglycerides 88 <150 mg/dL   HDL 53 >40 mg/dL   Total CHOL/HDL Ratio 2.5 RATIO   VLDL 18 0 - 40 mg/dL   LDL Cholesterol 63 0 - 99 mg/dL    Comment:        Total Cholesterol/HDL:CHD Risk Coronary Heart Disease Risk Table                     Men   Women  1/2 Average Risk   3.4   3.3  Average Risk       5.0   4.4  2 X Average Risk   9.6   7.1  3 X Average Risk  23.4   11.0        Use the calculated Patient Ratio above and the CHD Risk Table to determine the patient's CHD Risk.        ATP III CLASSIFICATION (LDL):  <100     mg/dL   Optimal  100-129  mg/dL   Near or Above                    Optimal  130-159  mg/dL   Borderline  160-189  mg/dL   High  >190     mg/dL   Very High   TSH     Status: None   Collection Time: 10/03/16  6:20 AM  Result Value Ref Range   TSH 0.550 0.350 - 4.500 uIU/mL    Comment: Performed by a 3rd Generation assay with a functional sensitivity of <=0.01 uIU/mL.    Blood Alcohol level:  Lab Results  Component Value Date   [facility] <5 10/02/2016   ETH <5 123456    Metabolic Disorder Labs:  Lab Results  Component Value Date   HGBA1C 6.8 (H) 04/19/2016   No results found  for: PROLACTIN Lab Results  Component Value Date   CHOL 134 10/03/2016   TRIG 88 10/03/2016   HDL 53 10/03/2016   CHOLHDL 2.5 10/03/2016   VLDL 18 10/03/2016   LDLCALC 63 10/03/2016    Current Medications: Current Facility-Administered Medications  Medication Dose Route Frequency Provider Last Rate Last Dose  . acetaminophen (TYLENOL) tablet 650 mg  650 mg Oral Q6H PRN Gonzella Lex, MD      . ALPRAZolam Duanne Moron) tablet 1 mg  1 mg Oral TID Gonzella Lex, MD      . alum & mag hydroxide-simeth (MAALOX/MYLANTA) 200-200-20 MG/5ML suspension 30 mL  30 mL Oral Q4H PRN Gonzella Lex, MD      . atorvastatin (LIPITOR) tablet 80 mg  80 mg Oral q1800 Gonzella Lex, MD      . clopidogrel (PLAVIX) tablet 75 mg  75 mg Oral Daily Gonzella Lex, MD      . cyclobenzaprine (FLEXERIL) tablet 10 mg  10 mg Oral TID PRN Gonzella Lex, MD      . fluvoxaMINE (LUVOX) tablet 50 mg  50 mg Oral QHS [name], MD      . furosemide (LASIX) tablet 20 mg  20 mg Oral Daily Jolanta  B Pucilowska, MD      . lisinopril (PRINIVIL,ZESTRIL) tablet 10 mg  10 mg Oral Daily [name], MD      . loratadine (CLARITIN) tablet 10 mg  10 mg Oral Daily John T Clapacs, MD      . magnesium hydroxide (MILK OF MAGNESIA) suspension 30 mL  30 mL Oral Daily PRN Gonzella Lex, MD      . mometasone-formoterol (DULERA) 200-5 MCG/ACT inhaler 2 puff  2 puff Inhalation BID Gonzella Lex, MD      . nitroGLYCERIN (NITROSTAT) SL tablet 0.4 mg  0.4 mg Sublingual Q5 min PRN Gonzella Lex, MD      . OLANZapine zydis (ZYPREXA) disintegrating tablet 5 mg  5 mg Oral BID AC & HS [name], MD      . OLANZapine zydis (ZYPREXA) disintegrating tablet 5 mg  5 mg Oral Once [name], MD      . pantoprazole (PROTONIX) EC tablet 40 mg  40 mg Oral Daily John T Clapacs, MD      . potassium chloride SA (K-DUR,KLOR-CON) CR tablet 10 mEq  10 mEq Oral Daily [name], MD      . traZODone (DESYREL) tablet 50  mg  50 mg Oral QHS Gonzella Lex, MD   50 mg at 10/02/16 2221   PTA Medications: Facility-Administered Medications Prior to Admission  Medication Dose Route Frequency Provider Last Rate Last Dose  . lidocaine (XYLOCAINE) 2 % jelly 1 application  1 application Urethral Once Nickie Retort, MD       Prescriptions Prior to Admission  Medication Sig Dispense Refill Last Dose  . acetaminophen (TYLENOL) 500 MG tablet Take 1,000 mg by mouth every 6 (six) hours as needed for mild pain or headache.   Taking  . ALPRAZolam (XANAX) 1 MG tablet Take 1 mg by mouth 3 (three) times daily as needed for anxiety or sleep.    Taking  . atorvastatin (LIPITOR) 80 MG tablet Take 80 mg by mouth daily.   Taking  . cetirizine (ZYRTEC) 10 MG tablet Take 10 mg by mouth daily. Reported on 02/08/2016   Not Taking  . clopidogrel (PLAVIX) 75 MG tablet Take 75 mg by mouth daily.    Taking  . clotrimazole (LOTRIMIN) 1 % cream APP EXT AA BID   Not Taking  . clotrimazole-betamethasone (LOTRISONE) cream Apply topically.   Not Taking  . cyclobenzaprine (FLEXERIL) 10 MG tablet Take 10 mg by mouth 3 (three) times daily as needed for muscle spasms.    Taking  . Fluticasone-Salmeterol (ADVAIR DISKUS) 250-50 MCG/DOSE AEPB Inhale 1 puff into the lungs 2 (two) times daily.    Taking  . furosemide (LASIX) 20 MG tablet Take 20 mg by mouth.   Taking  . hydrocortisone (ANUSOL-HC) 2.5 % rectal cream Apply topically.   Not Taking  . lisinopril (ZESTRIL) 10 MG tablet Take 1 tablet (10 mg total) by mouth daily. 60 tablet 1 Taking  . Magnesium 250 MG TABS Take 250 mg by mouth 2 (two) times daily.   Taking  . ondansetron (ZOFRAN) 4 MG tablet Take 1 tablet (4 mg total) by mouth daily as needed for nausea or vomiting. 20 tablet 1   . ondansetron (ZOFRAN-ODT) 4 MG disintegrating tablet Take 4 mg by mouth every 8 (eight) hours as needed.    Taking  . pantoprazole (PROTONIX) 40 MG tablet Take 1 tablet (40 mg total) by mouth 2 (two) times daily. [place]  tablet 1 Taking  . phenazopyridine (PYRIDIUM) 100 MG tablet    Not Taking  . polyethylene glycol powder (GLYCOLAX/MIRALAX) powder    Not Taking  . potassium chloride (MICRO-K) 10 MEQ CR capsule Take 10 mEq by mouth 2 (two) times daily.    Taking  . senna-docusate (SENOKOT-S) 8.6-50 MG tablet Take 1 tablet by mouth at bedtime as needed for mild constipation.   Taking  . tamsulosin (FLOMAX) 0.4 MG CAPS capsule TAKE ONE CAPSULE BY MOUTH DAILY WITH BREAKFAST   Not Taking  . traZODone (DESYREL) 50 MG tablet Take 50 mg by mouth at bedtime as needed.   Taking    Musculoskeletal: Strength & Muscle Tone: within normal limits Gait & Station: normal Patient leans: N/A  Psychiatric Specialty Exam: I reviewed physical exam performed in the emergency room and agree with her findings. Physical Exam  Nursing note and vitals reviewed.   Review of Systems  Psychiatric/Behavioral: Positive for depression, hallucinations and suicidal ideas. The patient is nervous/anxious.   All other systems reviewed and are negative.   Blood pressure (!) 153/76, pulse 85, temperature 98.3 F (36.8 C), temperature source Oral, resp. rate 18, height 5\' 1"  (1.549 m), weight 71.2 kg (157 lb).Body mass index is 29.66 kg/m.  See SRA.                                                  Sleep:  Number of Hours: 5.15    Treatment Plan Summary: Daily contact with patient to assess and evaluate symptoms and progress in treatment and Medication management   Ms. Tape is a 56 year old female with history of depression admitted for worsening of her symptoms, paranoia and suicidal threats in the context of treatment noncompliance.  1. Suicidal ideation. The patient is able to contract for safety in the hospital.  2. Mood, anxiety and psychosis. We started Zyprexa for psychosis and Luvox for depression and anxiety. The patient has trichotillomania for the past few weeks. The patient has been maintained on  Xanax 1 mg 3 times daily in the community. She apparently discontinued Xanax few days prior to admission. We will continue Xanax.  3. Hypertension. She is on lisinopril, furosemide, and potassium.  4. Coronary artery disease. She is on Lipitor and nitroglycerin. We will order EKG.  5. GERD. She is on Protonix.  6. COPD. She is on [place].  7. Urinary retention. She is on Flomax.  8. Insomnia. Trazodone is available.  9. Metabolic syndrome monitoring. Lipid panel and TSH are normal. Hemoglobin A1c pending.   10. B12 deficiency. We are checking a B12 level.   11. Disposition. She will be discharged to home with her husband. She will follow up with mental health professionals.   Observation Level/Precautions:  15 minute checks  Laboratory:  CBC Chemistry Profile UDS UA Vitamin B-12  Psychotherapy:    Medications:    Consultations:    Discharge Concerns:    Estimated LOS:  Other:     Physician Treatment Plan for Primary Diagnosis: Severe recurrent major depression with psychotic features ([place]) Long Term Goal(s): Improvement in symptoms so as ready for discharge  Short Term Goals: Ability to identify changes in lifestyle to reduce recurrence of condition will improve, Ability to verbalize feelings will improve, Ability to disclose and discuss suicidal ideas, Ability to demonstrate self-control will improve, Ability to identify  and develop effective coping behaviors will improve, Ability to maintain clinical measurements within normal limits will improve, Compliance with prescribed medications will improve and Ability to identify triggers associated with substance abuse/mental health issues will improve  Physician Treatment Plan for Secondary Diagnosis: Principal Problem:   Severe recurrent major depression with psychotic features ([place]) Active Problems:   CAD (coronary artery disease)   Benzodiazepine withdrawal ([place])   Trichotillomania  Long Term Goal(s): NA  Short Term  Goals: NA  I certify that inpatient services furnished can reasonably be expected to improve the patient's condition.    Orson Slick, MD 1/2/20189:23 AM

## 2016-10-03 NOTE — Progress Notes (Signed)
Patient ID: Kirsten Castro, female   DOB: 27-Jul-1961, 56 y.o.   MRN: [id number] Patient impatient, restless, "No sensation down there, I have water everywhere..." Evasive, ashamed to talk about it, Ms Kennyth Lose, Nursing Supervisor brought a balder scan, only 14 ml of Urine noted in the right upper quad; oncoming RN informed.

## 2016-10-03 NOTE — Tx Team (Signed)
Initial Treatment Plan 10/03/2016 3:10 AM Marti Sleigh [id number]    PATIENT STRESSORS: Health problems Medication change or noncompliance   PATIENT STRENGTHS: Ability for insight Capable of independent living Supportive family/friends   PATIENT IDENTIFIED PROBLEMS: Mood Instability  Suicidal Thoughts  Medication Noncompliance                 DISCHARGE CRITERIA:  Improved stabilization in mood, thinking, and/or behavior Motivation to continue treatment in a less acute level of care Reduction of life-threatening or endangering symptoms to within safe limits  PRELIMINARY DISCHARGE PLAN: Outpatient therapy Return to previous living arrangement  PATIENT/FAMILY INVOLVEMENT: This treatment plan has been presented to and reviewed with the patient, Kirsten Castro.  The patient has been given the opportunity to ask questions and make suggestions.  Electa Sniff, RN 10/03/2016, 3:10 AM

## 2016-10-03 NOTE — Tx Team (Signed)
Interdisciplinary Treatment and Diagnostic Plan Update  10/03/2016 Time of Session: 10:30am Kirsten Castro MRN: [id number]  Principal Diagnosis: Severe recurrent major depression with psychotic features [facility])  Secondary Diagnoses: Principal Problem:   Severe recurrent major depression with psychotic features ([place]) Active Problems:   CAD (coronary artery disease)   Benzodiazepine withdrawal ([place])   Trichotillomania   Current Medications:  Current Facility-Administered Medications  Medication Dose Route Frequency Provider Last Rate Last Dose  . acetaminophen (TYLENOL) tablet 650 mg  650 mg Oral Q6H PRN Gonzella Lex, MD      . ALPRAZolam Duanne Moron) tablet 1 mg  1 mg Oral TID Gonzella Lex, MD      . alum & mag hydroxide-simeth (MAALOX/MYLANTA) 200-200-20 MG/5ML suspension 30 mL  30 mL Oral Q4H PRN Gonzella Lex, MD      . atorvastatin (LIPITOR) tablet 80 mg  80 mg Oral q1800 Gonzella Lex, MD      . clopidogrel (PLAVIX) tablet 75 mg  75 mg Oral Daily Gonzella Lex, MD      . cyclobenzaprine (FLEXERIL) tablet 10 mg  10 mg Oral TID PRN Gonzella Lex, MD      . diphenhydrAMINE (BENADRYL) capsule 50 mg  50 mg Oral Q6H PRN [name], MD       Or  . diphenhydrAMINE (BENADRYL) injection 50 mg  50 mg Intramuscular Q6H PRN [name], MD      . fluvoxaMINE (LUVOX) tablet 50 mg  50 mg Oral QHS [name], MD      . furosemide (LASIX) tablet 20 mg  20 mg Oral Daily [name], MD      . haloperidol (HALDOL) tablet 5 mg  5 mg Oral Q6H PRN [name], MD       Or  . haloperidol lactate (HALDOL) injection 5 mg  5 mg Intramuscular Q6H PRN [name], MD      . lisinopril (PRINIVIL,ZESTRIL) tablet 10 mg  10 mg Oral Daily [name], MD      . loratadine (CLARITIN) tablet 10 mg  10 mg Oral Daily John T Clapacs, MD      . LORazepam (ATIVAN) tablet 2 mg  2 mg Oral Q6H PRN Clovis Fredrickson, MD       Or  . LORazepam (ATIVAN)  injection 2 mg  2 mg Intramuscular Q6H PRN [name], MD      . magnesium hydroxide (MILK OF MAGNESIA) suspension 30 mL  30 mL Oral Daily PRN Gonzella Lex, MD      . mometasone-formoterol (DULERA) 200-5 MCG/ACT inhaler 2 puff  2 puff Inhalation BID Gonzella Lex, MD      . nitroGLYCERIN (NITROSTAT) SL tablet 0.4 mg  0.4 mg Sublingual Q5 min PRN Gonzella Lex, MD      . OLANZapine zydis (ZYPREXA) disintegrating tablet 5 mg  5 mg Oral BID AC & HS [name], MD      . OLANZapine zydis (ZYPREXA) disintegrating tablet 5 mg  5 mg Oral Once [name], MD      . pantoprazole (PROTONIX) EC tablet 40 mg  40 mg Oral Daily John T Clapacs, MD      . potassium chloride SA (K-DUR,KLOR-CON) CR tablet 10 mEq  10 mEq Oral Daily [name], MD      . tamsulosin (FLOMAX) capsule 0.4 mg  0.4 mg Oral Daily Clovis Fredrickson, MD      .  traZODone (DESYREL) tablet 50 mg  50 mg Oral QHS Gonzella Lex, MD   50 mg at 10/02/16 2221   PTA Medications: Facility-Administered Medications Prior to Admission  Medication Dose Route Frequency Provider Last Rate Last Dose  . lidocaine (XYLOCAINE) 2 % jelly 1 application  1 application Urethral Once Nickie Retort, MD       Prescriptions Prior to Admission  Medication Sig Dispense Refill Last Dose  . acetaminophen (TYLENOL) 500 MG tablet Take 1,000 mg by mouth every 6 (six) hours as needed for mild pain or headache.   Taking  . ALPRAZolam (XANAX) 1 MG tablet Take 1 mg by mouth 3 (three) times daily as needed for anxiety or sleep.    Taking  . atorvastatin (LIPITOR) 80 MG tablet Take 80 mg by mouth daily.   Taking  . cetirizine (ZYRTEC) 10 MG tablet Take 10 mg by mouth daily. Reported on 02/08/2016   Not Taking  . clopidogrel (PLAVIX) 75 MG tablet Take 75 mg by mouth daily.    Taking  . clotrimazole (LOTRIMIN) 1 % cream APP EXT AA BID   Not Taking  . clotrimazole-betamethasone (LOTRISONE) cream Apply topically.   Not Taking  .  cyclobenzaprine (FLEXERIL) 10 MG tablet Take 10 mg by mouth 3 (three) times daily as needed for muscle spasms.    Taking  . Fluticasone-Salmeterol (ADVAIR DISKUS) 250-50 MCG/DOSE AEPB Inhale 1 puff into the lungs 2 (two) times daily.    Taking  . furosemide (LASIX) 20 MG tablet Take 20 mg by mouth.   Taking  . hydrocortisone (ANUSOL-HC) 2.5 % rectal cream Apply topically.   Not Taking  . lisinopril (ZESTRIL) 10 MG tablet Take 1 tablet (10 mg total) by mouth daily. 60 tablet 1 Taking  . Magnesium 250 MG TABS Take 250 mg by mouth 2 (two) times daily.   Taking  . ondansetron (ZOFRAN) 4 MG tablet Take 1 tablet (4 mg total) by mouth daily as needed for nausea or vomiting. 20 tablet 1   . ondansetron (ZOFRAN-ODT) 4 MG disintegrating tablet Take 4 mg by mouth every 8 (eight) hours as needed.    Taking  . pantoprazole (PROTONIX) 40 MG tablet Take 1 tablet (40 mg total) by mouth 2 (two) times daily. 60 tablet 1 Taking  . phenazopyridine (PYRIDIUM) 100 MG tablet    Not Taking  . polyethylene glycol powder (GLYCOLAX/MIRALAX) powder    Not Taking  . potassium chloride (MICRO-K) 10 MEQ CR capsule Take 10 mEq by mouth 2 (two) times daily.    Taking  . senna-docusate (SENOKOT-S) 8.6-50 MG tablet Take 1 tablet by mouth at bedtime as needed for mild constipation.   Taking  . tamsulosin (FLOMAX) 0.4 MG CAPS capsule TAKE ONE CAPSULE BY MOUTH DAILY WITH BREAKFAST   Not Taking  . traZODone (DESYREL) 50 MG tablet Take 50 mg by mouth at bedtime as needed.   Taking    Patient Stressors: Health problems Medication change or noncompliance  Patient Strengths: Ability for insight Capable of independent living Supportive family/friends  Treatment Modalities: Medication Management, Group therapy, Case management,  1 to 1 session with clinician, Psychoeducation, Recreational therapy.   Physician Treatment Plan for Primary Diagnosis: Severe recurrent major depression with psychotic features ([place]) Long Term Goal(s):  Improvement in symptoms so as ready for discharge NA   Short Term Goals: Ability to identify changes in lifestyle to reduce recurrence of condition will improve Ability to verbalize feelings will improve Ability to disclose and discuss  suicidal ideas Ability to demonstrate self-control will improve Ability to identify and develop effective coping behaviors will improve Ability to maintain clinical measurements within normal limits will improve Compliance with prescribed medications will improve Ability to identify triggers associated with substance abuse/mental health issues will improve NA  Medication Management: Evaluate patient's response, side effects, and tolerance of medication regimen.  Therapeutic Interventions: 1 to 1 sessions, Unit Group sessions and Medication administration.  Evaluation of Outcomes: Progressing  Physician Treatment Plan for Secondary Diagnosis: Principal Problem:   Severe recurrent major depression with psychotic features ([place]) Active Problems:   CAD (coronary artery disease)   Benzodiazepine withdrawal ([place])   Trichotillomania  Long Term Goal(s): Improvement in symptoms so as ready for discharge NA   Short Term Goals: Ability to identify changes in lifestyle to reduce recurrence of condition will improve Ability to verbalize feelings will improve Ability to disclose and discuss suicidal ideas Ability to demonstrate self-control will improve Ability to identify and develop effective coping behaviors will improve Ability to maintain clinical measurements within normal limits will improve Compliance with prescribed medications will improve Ability to identify triggers associated with substance abuse/mental health issues will improve NA     Medication Management: Evaluate patient's response, side effects, and tolerance of medication regimen.  Therapeutic Interventions: 1 to 1 sessions, Unit Group sessions and Medication administration.  Evaluation  of Outcomes: Progressing   RN Treatment Plan for Primary Diagnosis: Severe recurrent major depression with psychotic features ([place]) Long Term Goal(s): Knowledge of disease and therapeutic regimen to maintain health will improve  Short Term Goals: Ability to remain free from injury will improve, Ability to verbalize frustration and anger appropriately will improve, Ability to demonstrate self-control, Ability to participate in decision making will improve, Ability to verbalize feelings will improve and Ability to identify and develop effective coping behaviors will improve  Medication Management: RN will administer medications as ordered by provider, will assess and evaluate patient's response and provide education to patient for prescribed medication. RN will report any adverse and/or side effects to prescribing provider.  Therapeutic Interventions: 1 on 1 counseling sessions, Psychoeducation, Medication administration, Evaluate responses to treatment, Monitor vital signs and CBGs as ordered, Perform/monitor CIWA, COWS, AIMS and Fall Risk screenings as ordered, Perform wound care treatments as ordered.  Evaluation of Outcomes: Progressing   LCSW Treatment Plan for Primary Diagnosis: Severe recurrent major depression with psychotic features ([place]) Long Term Goal(s): Safe transition to appropriate next level of care at discharge, Engage patient in therapeutic group addressing interpersonal concerns.  Short Term Goals: Engage patient in aftercare planning with referrals and resources and Increase social support  Therapeutic Interventions: Assess for all discharge needs, 1 to 1 time with Social worker, Explore available resources and support systems, Assess for adequacy in community support network, Educate family and significant other(s) on suicide prevention, Complete Psychosocial Assessment, Interpersonal group therapy.  Evaluation of Outcomes: Progressing   Progress in Treatment: Attending  groups: No. Participating in groups: No. Taking medication as prescribed: Yes. Toleration medication: Yes. Family/Significant other contact made: No, will contact:  pt's family Patient understands diagnosis: Yes. Discussing patient identified problems/goals with staff: Yes. Medical problems stabilized or resolved: CSW still assessing   Denies suicidal/homicidal ideation: No. CSW still assessing   Issues/concerns per patient self-inventory: No. Other: noe listed   New problem(s) identified: No, Describe:  none listed  New Short Term/Long Term Goal(s):  Discharge Plan or Barriers: CSW still assessing for appropriate referrals  Reason for Continuation of Hospitalization: Anxiety  Depression Mania  Estimated Length of Stay: 3-5 days  Attendees: Patient:  10/03/2016 10:42 AM  Physician: Dr. Bary Leriche, MD 10/03/2016 10:42 AM  Nursing: Polly Cobia, RN 10/03/2016 10:42 AM  RN Care Manager: 10/03/2016 10:42 AM  Social Worker: Alphonse Guild. Daine Gravel, LCAS  10/03/2016 10:42 AM  Recreational Therapist: Everitt Amber, LRT 10/03/2016 10:42 AM  Other:  10/03/2016 10:42 AM  Other:  10/03/2016 10:42 AM  Other: 10/03/2016 10:42 AM    Scribe for Treatment Team: Alphonse Guild Samanth Mirkin, LCSWA 10/03/2016 10:42 AM

## 2016-10-03 NOTE — BHH Suicide Risk Assessment (Signed)
[facility] Admission Suicide Risk Assessment   Nursing information obtained from:  Patient, Review of record Demographic factors:  Caucasian, Unemployed Current Mental Status:  Suicidal ideation indicated by others Loss Factors:  Decline in physical health, Financial problems / change in socioeconomic status Historical Factors:  NA Risk Reduction Factors:  Positive therapeutic relationship  Total Time spent with patient: 1 hour Principal Problem: Severe recurrent major depression with psychotic features ([place]) Diagnosis:   Patient Active Problem List   Diagnosis Date Noted  . Severe recurrent major depression with psychotic features ([place]) [F33.3] 10/02/2016  . Benzodiazepine withdrawal ([place]) [F13.239] 10/02/2016  . 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 Abdominal pain [R10.9] 04/19/2016  . Sepsis ([place]) [A41.9] 03/09/2016  . Urinary retention [R33.9] 03/09/2016  . CAD (coronary artery disease) [I25.10] 03/09/2016  . Frank hematuria [R31.0] 03/08/2016  . Temporary cerebral vascular dysfunction [G93.9] 02/03/2016  . Peripheral vascular disease ([place]) [I73.9] 02/03/2016  . BP (high blood pressure) [I10] 02/03/2016  . HLD (hyperlipidemia) [E78.5] 02/03/2016  . Clinical depression [F32.9] 02/03/2016  . Carotid artery narrowing [I65.29] 02/03/2016  . 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 Malignant neoplastic disease ([place]) [C80.1] 02/03/2016  . Anxiety [F41.9] 02/03/2016  . Abdominal pain, generalized [R10.84]   . Nausea with vomiting [R11.2]   . Gastritis [K29.70]   . Esophageal candidiasis ([place]) [B37.81]   . Noninfectious diarrhea [K52.9]   . Intractable pain [R52] 01/21/2016  . Upper abdominal pain [R10.10]   . Nausea and vomiting in adult [R11.2]   . Ischemic colitis ([place]) [K55.9] 01/01/2016  . Colitis [K52.9] 12/15/2015  . GI bleed [K92.2] 12/15/2015  . Acute inflammation of the pancreas [K85.90] 12/06/2015  . Vitamin B12 deficiency anemia [D51.9]  02/25/2015  . Absolute anemia [D64.9] 02/25/2015  . Carotid stenosis [I65.29] 02/10/2015  . Personal history of other specified conditions [Z87.898] 11/27/2014  . Osteomyelitis ([place]) [M86.9] 11/27/2014  . H/O coronary artery bypass surgery [Z95.1] 05/18/2014   Subjective Data: psychotic break.  Continued Clinical Symptoms:  Alcohol Use Disorder Identification Test Final Score (AUDIT): 0 The "Alcohol Use Disorders Identification Test", Guidelines for Use in Primary Care, Second Edition.  [facility]). Score between 0-7:  no or low risk or alcohol related problems. Score between 8-15:  moderate risk of alcohol related problems. Score between 16-19:  high risk of alcohol related problems. Score 20 or above:  warrants further diagnostic evaluation for alcohol dependence and treatment.   CLINICAL FACTORS:   Severe Anxiety and/or Agitation Depression:   Delusional Currently Psychotic   Musculoskeletal: Strength & Muscle Tone: within normal limits Gait & Station: normal Patient leans: N/A  Psychiatric Specialty Exam: Physical Exam  Nursing note and vitals reviewed.   Review of Systems  Psychiatric/Behavioral: Positive for depression, hallucinations and suicidal ideas. The patient is nervous/anxious.   All other systems reviewed and are negative.   Blood pressure (!) 153/76, pulse 85, temperature 98.3 F (36.8 C), temperature source Oral, resp. rate 18, height 5\' 1"  (1.549 m), weight 71.2 kg (157 lb).Body mass index is 29.66 kg/m.  General Appearance: Disheveled  Eye Contact:  Good  Speech:  Clear and Coherent  Volume:  Normal  Mood:  Anxious  Affect:  Congruent  Thought Process:  Disorganized, Irrelevant and Descriptions of Associations: Loose  Orientation:  Full (Time, Place, and Person)  Thought Content:  Delusions and Paranoid Ideation  Suicidal Thoughts:  Yes.  with intent/plan  Homicidal Thoughts:  No  Memory:  Immediate;   Fair  Recent;    Fair Remote;   Fair  Judgement:  Poor  Insight:  Lacking  Psychomotor Activity:  Increased  Concentration:  Concentration: Poor and Attention Span: Poor  Recall:  Poor  Fund of Knowledge:  Fair  Language:  Fair  Akathisia:  No  Handed:  Right  AIMS (if indicated):     Assets:  Communication Skills Desire for Improvement Financial Resources/Insurance Housing Intimacy Resilience Social Support  ADL's:  Intact  Cognition:  WNL  Sleep:  Number of Hours: 5.15      COGNITIVE FEATURES THAT CONTRIBUTE TO RISK:  None    SUICIDE RISK:   Moderate:  Frequent suicidal ideation with limited intensity, and duration, some specificity in terms of plans, no associated intent, good self-control, limited dysphoria/symptomatology, some risk factors present, and identifiable protective factors, including available and accessible social support.   PLAN OF CARE: Hospital admission, medication management, discharge planning.  [name] is a 56 year old female with history of depression admitted for worsening of her symptoms, paranoia and suicidal threats in the context of treatment noncompliance.  1. Suicidal ideation. The patient is able to contract for safety in the hospital.  2. Mood, anxiety and psychosis. We started Zyprexa for psychosis and Luvox for depression and anxiety. The patient has trichotillomania for the past few weeks. The patient has been maintained on Xanax 1 mg 3 times daily in the community. She apparently discontinued Xanax few days prior to admission. We will continue Xanax.  3. Hypertension. She is on lisinopril, furosemide, and potassium.  4. Coronary artery disease. She is on Lipitor and nitroglycerin. We will order EKG.  5. GERD. She is on Protonix.  6. COPD. She is on [place].  7. Urinary retention. She is on Flomax.  8. Insomnia. Trazodone is available.  9. Metabolic syndrome monitoring. Lipid panel and TSH are normal. Hemoglobin A1c pending.   10. B12  deficiency. We are checking a B12 level.   11. Disposition. She will be discharged to home with her husband. She will follow up with mental health professionals.    I certify that inpatient services furnished can reasonably be expected to improve the patient's condition.  Orson Slick, MD 10/03/2016, 9:14 AM

## 2016-10-03 NOTE — Progress Notes (Signed)
Recreation Therapy Notes  Date: 01.02.18 Time: 3:00 pm Location: Craft Room  Group Topic: Self-expression  Goal Area(s) Addresses:  Patient will identify one color per emotion listed on wheel. Patient will verbalize benefit of using art as a means of self-expression. Patient will verbalize one emotion experienced during session. Patient will be educated on other forms of self-expression.  Behavioral Response: Did not attend  Intervention: Emotion Wheel  Activity: Patients were given an Licensed conveyancer with 7 different emotions listed. Patients were instructed to pick a color for each emotion and color it in on the wheel.  Education: LRT educated patients on other forms of self-expression.  Education Outcome: Patient did not attend group.  Clinical Observations/Feedback: Patient did not attend group.  Leonette Monarch, LRT/CTRS 10/03/2016 4:04 PM

## 2016-10-04 MED ORDER — AMLODIPINE BESYLATE 5 MG PO TABS
10.0000 mg | ORAL_TABLET | Freq: Every day | ORAL | Status: DC
  Administered 2016-10-08 – 2016-10-20 (×11): 10 mg via ORAL
  Filled 2016-10-04 (×14): qty 2

## 2016-10-04 MED ORDER — FLUVOXAMINE MALEATE 50 MG PO TABS
100.0000 mg | ORAL_TABLET | Freq: Every day | ORAL | Status: DC
  Administered 2016-10-04 – 2016-10-11 (×2): 100 mg via ORAL
  Filled 2016-10-04 (×8): qty 2

## 2016-10-04 MED ORDER — CYANOCOBALAMIN 1000 MCG/ML IJ SOLN
1000.0000 ug | Freq: Every day | INTRAMUSCULAR | Status: AC
  Administered 2016-10-09: 1000 ug via INTRAMUSCULAR
  Filled 2016-10-04 (×4): qty 1

## 2016-10-04 MED ORDER — HYDRALAZINE HCL 25 MG PO TABS
50.0000 mg | ORAL_TABLET | Freq: Once | ORAL | Status: AC
  Administered 2016-10-04: 50 mg via ORAL
  Filled 2016-10-04: qty 2

## 2016-10-04 MED ORDER — AMLODIPINE BESYLATE 5 MG PO TABS
5.0000 mg | ORAL_TABLET | Freq: Every day | ORAL | Status: DC
  Administered 2016-10-04: 5 mg via ORAL
  Filled 2016-10-04: qty 1

## 2016-10-04 MED ORDER — LISINOPRIL 10 MG PO TABS
20.0000 mg | ORAL_TABLET | Freq: Every day | ORAL | Status: DC
  Administered 2016-10-04: 20 mg via ORAL
  Filled 2016-10-04: qty 2

## 2016-10-04 MED ORDER — OLANZAPINE 5 MG PO TBDP
10.0000 mg | ORAL_TABLET | Freq: Two times a day (BID) | ORAL | Status: DC
  Administered 2016-10-04 (×2): 10 mg via ORAL
  Filled 2016-10-04 (×2): qty 2

## 2016-10-04 NOTE — Progress Notes (Signed)
Pt continues to be disorganized with thoughts stating repeatedly, "I have too much water on me!" Appears less anxious after morning medications. Stays in her room in bed most of the morning. Did come to dayroom for lunch and ate her lunch and drank her fluids as provided. Compliant with mid day dose of xanax as prescribed after [facility] encouragement. Pt irritable on approach.   Support and encouragement provided with use of therapeutic communication. Medications administered as ordered with education. Safety maintained with every 15 minute checks. Will continue to monitor.

## 2016-10-04 NOTE — Progress Notes (Signed)
Contacted by nurses about elevated BP.  Orders given for hydralazine 50 mg once, norvasc 5 mg/daily and lisinopril 20 mg/daily.

## 2016-10-04 NOTE — Progress Notes (Signed)
Approached pt regarding medications to be administered and VS check due. Pt immediately refused, began pulling her hair and pulls handfuls out. After much encouragement, pt did take the medications with apple sauce. Continues to yell, "i've got too much water in me already!!" Pt continues to grab at her abdomen, pulling her shirt up and pants down. Pt did eat her breakfast, and reports "I can't get full because of all this water!" Safety maintained with every 15 minute checks. Will continue to monitor.

## 2016-10-04 NOTE — Progress Notes (Signed)
Pt is awake and alert, in dayroom currently eating breakfast with tray filled with food/fluids in front of her. Encouraged pt to eat/drink. No acute distress noted at this time. Safety maintained. Will continue to monitor.

## 2016-10-04 NOTE — Progress Notes (Signed)
Pt found in her room's bathroom. Apparently, she had a bowel movement, and is holding feces in her hands. Pt refusing to sit on the toilet, continues to catch her feces in her hands. This nurse did assist pt in cleaning herself after the bowel movement. Pt refuses to shower. New scrubs, incontinence pad applied. Safety maintained. Will continue to monitor.

## 2016-10-04 NOTE — Progress Notes (Signed)
Recreation Therapy Notes  Date: 01.03.17 Time: 1:00 pm Location: Craft Room  Group Topic: Self-esteem  Goal Area(s) Addresses:  Patient will identify at least one positive trait about self. Patient will identify one healthy coping skill.  Behavioral Response: Did not attend  Intervention: All About Me  Activity: Patients were instructed to make a pamphlet including their life's motto, positive traits, healthy coping skills, and their support system.  Education: LRT educated patients on ways to improve their self-esteem.  Education Outcome: Patient did not attend group.  Clinical Observations/Feedback: Patient did not attend group.  Leonette Monarch, LRT/CTRS 10/04/2016 3:50 PM

## 2016-10-04 NOTE — BHH Group Notes (Signed)
[place] LCSW Group Therapy   10/04/2016  9:30 AM  Type of Therapy: Group Therapy   Participation Level: Did Not Attend. Patient invited to participate but declined.    Kirsten Castro. Kirsten Castro, MSW, LCSWA, LCAS

## 2016-10-04 NOTE — BHH Counselor (Signed)
10/04/16 1115.  PSA attempted with pt who reports she does not feel well enough to speak at this time. Lurline Idol, LCSW

## 2016-10-04 NOTE — Progress Notes (Signed)
[facility] MD Progress Note  10/04/2016 11:05 AM Kirsten Castro  MRN:  [id number]  Subjective:  Kirsten Castro accepted medications once tonight and this morning with much encouragement but still complains of the bed. She however is no longer roaming the hallways panting and graunting. She still complains of being "full of water". Her blood pressure was elevated this morning and she received additional antihypertensives with normalization of blood pressure. She did not take her antihypertensives for at least 2 days prior.   Per nursing: D: Patient is very disorganized and anxious. Repeatingly states "I'm full of water". States she has thoughts of hurting herself because she's full of water. Patient gets irritable and agitated at times.  A: Medication given with much encouragement. Stated we were ruining her lives with the medication.  R: Redirection needed at time. Safety maintained with 15 min checks.   Principal Problem: Severe recurrent major depression with psychotic features [facility]) Diagnosis:   Patient Active Problem List   Diagnosis Date Noted  . Severe recurrent major depression with psychotic features ([place]) [F33.3] 10/02/2016  . Benzodiazepine withdrawal ([place]) [F13.239] 10/02/2016  . 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 Abdominal pain [R10.9] 04/19/2016  . Sepsis ([place]) [A41.9] 03/09/2016  . Urinary retention [R33.9] 03/09/2016  . CAD (coronary artery disease) [I25.10] 03/09/2016  . Frank hematuria [R31.0] 03/08/2016  . Temporary cerebral vascular dysfunction [G93.9] 02/03/2016  . Peripheral vascular disease ([place]) [I73.9] 02/03/2016  . BP (high blood pressure) [I10] 02/03/2016  . HLD (hyperlipidemia) [E78.5] 02/03/2016  . Clinical depression [F32.9] 02/03/2016  . Carotid artery narrowing [I65.29] 02/03/2016  . 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 Malignant neoplastic disease ([place]) [C80.1] 02/03/2016  . Anxiety [F41.9] 02/03/2016  . Abdominal  pain, generalized [R10.84]   . Nausea with vomiting [R11.2]   . Gastritis [K29.70]   . Esophageal candidiasis ([place]) [B37.81]   . Noninfectious diarrhea [K52.9]   . Intractable pain [R52] 01/21/2016  . Upper abdominal pain [R10.10]   . Nausea and vomiting in adult [R11.2]   . Ischemic colitis ([place]) [K55.9] 01/01/2016  . Colitis [K52.9] 12/15/2015  . GI bleed [K92.2] 12/15/2015  . Acute inflammation of the pancreas [K85.90] 12/06/2015  . Vitamin B12 deficiency anemia [D51.9] 02/25/2015  . Absolute anemia [D64.9] 02/25/2015  . Carotid stenosis [I65.29] 02/10/2015  . Personal history of other specified conditions [Z87.898] 11/27/2014  . Osteomyelitis ([place]) [M86.9] 11/27/2014  . H/O coronary artery bypass surgery [Z95.1] 05/18/2014   Total Time spent with patient: 20 minutes  Past Psychiatric History: depression.  Past Medical History:  Past Medical History:  Diagnosis Date  . Anemia   . Anginal pain ([place])   . Anxiety   . 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  This was done at [facility]  . BP (high blood pressure) 02/03/2016  . Cancer ([place])    melanoma skin cancer  . Carotid stenosis 02/10/2015  . Chronic kidney disease    UTI  . Colitis 12/15/2015  . Collagen vascular disease ([place])   . COPD (chronic obstructive pulmonary disease) ([place])   .  Coronary artery disease   . Depression   . Esophageal candidiasis ([place])   . Gastritis   . GERD (gastroesophageal reflux disease)   . GI bleed 12/15/2015  . Headache   . Hypertension   . Ischemic colitis ([place]) 01/01/2016   Overview:   SMA mesenteric  ischemia now status post angioplasty by vascular of In stent restenosis.   Overview:  S?P SMA angioplasty   . Myocardial infarction   . Nausea and vomiting in adult   . Noninfectious diarrhea   . Peripheral vascular disease ([place]) 02/03/2016   Overview:  Follows with Dr. Delana Meyer    . Shortness of breath dyspnea    with exertion  . Stroke [facility])    TIA X 2  . Temporary cerebral vascular dysfunction 02/03/2016    Past Surgical History:  Procedure Laterality Date  . ABDOMINAL HYSTERECTOMY    . BACK SURGERY    . BREAST BIOPSY Left 6 2017   results not in yet  . COLONOSCOPY WITH PROPOFOL N/A 01/24/2016   Procedure: COLONOSCOPY WITH PROPOFOL;  Surgeon: Lucilla Lame, MD;  Location: [facility] ENDOSCOPY;  Service: Endoscopy;  Laterality: N/A;  . CORONARY ANGIOPLASTY    . CORONARY ARTERY BYPASS GRAFT    . ESOPHAGOGASTRODUODENOSCOPY (EGD) WITH PROPOFOL N/A 01/24/2016   Procedure: ESOPHAGOGASTRODUODENOSCOPY (EGD) WITH PROPOFOL;  Surgeon: Lucilla Lame, MD;  Location: [facility] ENDOSCOPY;  Service: Endoscopy;  Laterality: N/A;  . KYPHOPLASTY N/A 02/08/2016   Procedure: KYPHOPLASTY L1;  Surgeon: Hessie Knows, MD;  Location: [facility] ORS;  Service: Orthopedics;  Laterality: N/A;  . KYPHOPLASTY N/A 03/28/2016   Procedure: KYPHOPLASTY;  Surgeon: Hessie Knows, MD;  Location: [facility] ORS;  Service: Orthopedics;  Laterality: N/A;  . NOSE SURGERY     cancer removal  . OTHER SURGICAL HISTORY  Jul 11 2014   sternum removal  . PERIPHERAL VASCULAR CATHETERIZATION N/A 02/10/2015   Procedure: Carotid PTA/Stent Intervention;  Surgeon: Katha Cabal, MD;  Location: [place] CV LAB;  Service: Cardiovascular;  Laterality: N/A;  . PERIPHERAL VASCULAR CATHETERIZATION N/A 12/17/2015   Procedure: Visceral Angiography;  Surgeon: Katha Cabal, MD;  Location: [place] CV LAB;  Service: Cardiovascular;  Laterality: N/A;  . PERIPHERAL VASCULAR CATHETERIZATION N/A 12/17/2015   Procedure: Visceral Artery Intervention;  Surgeon: Katha Cabal, MD;  Location: [place] CV LAB;  Service: Cardiovascular;  Laterality: N/A;  . STENT PLACEMENT VASCULAR ([place] HX)     Family History:  Family History  Problem Relation Age of Onset  . Breast cancer Sister   . CAD Father   . CAD Brother    Family Psychiatric  History: see H&P. Social History:  History  Alcohol Use No     History  Drug Use No    Social History   Social History  . Marital status: Married    Spouse name: N/A  . Number of children: N/A  . Years of education: N/A   Social History Main Topics  . Smoking status: Former Smoker    Packs/day: 1.00    Types: Cigarettes    Quit date: 08/01/2012  . Smokeless tobacco: Never Used  . Alcohol use No  . Drug use: No  . Sexual activity: Yes   Other Topics Concern  . None   Social History Narrative  . None   Additional Social History:                         Sleep: Fair  Appetite:  Poor  Current Medications: Current Facility-Administered Medications  Medication Dose Route Frequency Provider Last Rate Last Dose  . acetaminophen (TYLENOL) tablet 650 mg  650 mg Oral Q6H PRN Gonzella Lex, MD      . ALPRAZolam Duanne Moron) tablet 1 mg  1 mg Oral TID Gonzella Lex, MD   1 mg at 10/04/16 [id number]  . alum & mag hydroxide-simeth (MAALOX/MYLANTA) 200-200-20 MG/5ML suspension 30 mL  30 mL Oral Q4H PRN Gonzella Lex, MD      . Derrill Memo ON 10/05/2016] amLODipine (NORVASC) tablet 10 mg  10 mg Oral Daily Verneal Wiers B Janiyha Montufar, MD      . atorvastatin (LIPITOR) tablet 80 mg  80 mg Oral q1800 Gonzella Lex, MD   80 mg at 10/03/16 2104  . clopidogrel (PLAVIX) tablet 75 mg  75 mg Oral Daily Gonzella Lex, MD   75 mg at 10/04/16 [id number]  . cyclobenzaprine (FLEXERIL) tablet 10 mg  10 mg Oral TID PRN Gonzella Lex, MD      . diphenhydrAMINE (BENADRYL) capsule 50 mg  50 mg Oral Q6H PRN Esai Stecklein B Farrel Guimond, MD       Or  . diphenhydrAMINE (BENADRYL) injection 50 mg  50 mg Intramuscular Q6H PRN Maire Govan B Devyn Sheerin, MD    50 mg at 10/03/16 1220  . fluvoxaMINE (LUVOX) tablet 100 mg  100 mg Oral QHS Nirali Magouirk B Marlo Goodrich, MD      . furosemide (LASIX) tablet 20 mg  20 mg Oral Daily Silus Lanzo B Alva Kuenzel, MD   20 mg at 10/04/16 0839  . haloperidol (HALDOL) tablet 5 mg  5 mg Oral Q6H PRN Maylie Ashton B Laniesha Das, MD       Or  . haloperidol lactate (HALDOL) injection 5 mg  5 mg Intramuscular Q6H PRN Clovis Fredrickson, MD   5 mg at 10/03/16 1219  . loratadine (CLARITIN) tablet 10 mg  10 mg Oral Daily Gonzella Lex, MD   10 mg at 10/04/16 0839  . LORazepam (ATIVAN) tablet 2 mg  2 mg Oral Q6H PRN Clovis Fredrickson, MD       Or  . LORazepam (ATIVAN) injection 2 mg  2 mg Intramuscular Q6H PRN Clovis Fredrickson, MD   2 mg at 10/03/16 1220  . magnesium hydroxide (MILK OF MAGNESIA) suspension 30 mL  30 mL Oral Daily PRN Gonzella Lex, MD      . mometasone-formoterol (DULERA) 200-5 MCG/ACT inhaler 2 puff  2 puff Inhalation BID Gonzella Lex, MD   2 puff at 10/04/16 1045  . nitroGLYCERIN (NITROSTAT) SL tablet 0.4 mg  0.4 mg Sublingual Q5 min PRN Gonzella Lex, MD      . OLANZapine zydis (ZYPREXA) disintegrating tablet 10 mg  10 mg Oral BID AC & HS Jalen Oberry B Makaio Mach, MD   10 mg at 10/04/16 0838  . OLANZapine zydis (ZYPREXA) disintegrating tablet 5 mg  5 mg Oral Once Aleah Ahlgrim B Hayat Warbington, MD      . pantoprazole (PROTONIX) EC tablet 40 mg  40 mg Oral Daily Gonzella Lex, MD   40 mg at 10/04/16 [id number]  . potassium chloride SA (K-DUR,KLOR-CON) CR tablet 10 mEq  10 mEq Oral Daily Clovis Fredrickson, MD   10 mEq at 10/04/16 0838  . tamsulosin (FLOMAX) capsule 0.4 mg  0.4 mg Oral Daily Benna Arno B Lorae Roig, MD   0.4 mg at 10/04/16 0838  . traZODone (DESYREL) tablet 50 mg  50 mg Oral QHS Gonzella Lex, MD  50 mg at 10/03/16 2057    Lab Results:  Results for orders placed or performed during the hospital encounter of 10/02/16 (from the past 48 hour(s))  Lipid panel     Status: None   Collection Time: 10/03/16  6:20 AM   Result Value Ref Range   Cholesterol 134 0 - 200 mg/dL   Triglycerides 88 <150 mg/dL   HDL 53 >40 mg/dL   Total CHOL/HDL Ratio 2.5 RATIO   VLDL 18 0 - 40 mg/dL   LDL Cholesterol 63 0 - 99 mg/dL    Comment:        Total Cholesterol/HDL:CHD Risk Coronary Heart Disease Risk Table                     Men   Women  1/2 Average Risk   3.4   3.3  Average Risk       5.0   4.4  2 X Average Risk   9.6   7.1  3 X Average Risk  23.4   11.0        Use the calculated Patient Ratio above and the CHD Risk Table to determine the patient's CHD Risk.        ATP III CLASSIFICATION (LDL):  <100     mg/dL   Optimal  100-129  mg/dL   Near or Above                    Optimal  130-159  mg/dL   Borderline  160-189  mg/dL   High  >190     mg/dL   Very High   TSH     Status: None   Collection Time: 10/03/16  6:20 AM  Result Value Ref Range   TSH 0.550 0.350 - 4.500 uIU/mL    Comment: Performed by a 3rd Generation assay with a functional sensitivity of <=0.01 uIU/mL.  Vitamin B12     Status: None   Collection Time: 10/03/16  6:20 AM  Result Value Ref Range   Vitamin B-12 343 180 - 914 pg/mL    Comment: (NOTE) This assay is not validated for testing neonatal or myeloproliferative syndrome specimens for Vitamin B12 levels. Performed at [facility]     Blood Alcohol level:  Lab Results  Component Value Date   [facility] <5 10/02/2016   ETH <5 [id number]    Metabolic Disorder Labs: Lab Results  Component Value Date   HGBA1C 6.8 (H) 04/19/2016   No results found for: PROLACTIN Lab Results  Component Value Date   CHOL 134 10/03/2016   TRIG 88 10/03/2016   HDL 53 10/03/2016   CHOLHDL 2.5 10/03/2016   VLDL 18 10/03/2016   LDLCALC 63 10/03/2016    Physical Findings: AIMS:  , ,  ,  ,    CIWA:    COWS:     Musculoskeletal: Strength & Muscle Tone: within normal limits Gait & Station: normal Patient leans: N/A  Psychiatric Specialty Exam: Physical Exam  Nursing note and vitals  reviewed.   Review of Systems  Psychiatric/Behavioral: Positive for hallucinations and suicidal ideas. The patient is nervous/anxious.   All other systems reviewed and are negative.   Blood pressure (!) 126/49, pulse 84, temperature 98.3 F (36.8 C), temperature source Oral, resp. rate 18, height 5\' 1"  (1.549 m), weight 71.2 kg (157 lb), SpO2 99 %.Body mass index is 29.66 kg/m.  General Appearance: Disheveled  Eye Contact:  Good  Speech:  Pressured  Volume:  Increased  Mood:  Anxious  Affect:  Congruent  Thought Process:  Disorganized and Descriptions of Associations: Loose  Orientation:  Full (Time, Place, and Person)  Thought Content:  Illogical, Delusions, Paranoid Ideation and Rumination  Suicidal Thoughts:  Yes.  with intent/plan  Homicidal Thoughts:  No  Memory:  Immediate;   Poor Recent;   Poor Remote;   Poor  Judgement:  Poor  Insight:  Lacking  Psychomotor Activity:  Increased  Concentration:  Concentration: Poor  Recall:  Poor  Fund of Knowledge:  Fair  Language:  Fair  Akathisia:  No  Handed:  Right  AIMS (if indicated):     Assets:  Communication Skills Desire for Improvement Financial Resources/Insurance Housing Resilience Social Support  ADL's:  Intact  Cognition:  WNL  Sleep:  Number of Hours: 8.75     Treatment Plan Summary: Daily contact with patient to assess and evaluate symptoms and progress in treatment and Medication management   Kirsten Castro is a 56 year old female with history of depression admitted for worsening of her symptoms, paranoia and suicidal threats in the context of treatment noncompliance.  Agitation. Haldol, Ativan and Benadryl are available per forced medication order.  1. Suicidal ideation. The patient is able to contract for safety in the hospital.  2. Mood, anxiety and psychosis. We started Zyprexa for psychosis and Luvox for depression and anxiety. The patient has trichotillomania for the past few weeks. The patient has  been maintained on Xanax 1 mg 3 times daily in the community. She apparently discontinued Xanax few days prior to admission. We will continue Xanax.  3. Hypertension. She is on lisinopril, furosemide, and potassium. Hydralazine and amlodipine were given this am.   4. Coronary artery disease. She is on Lipitor and nitroglycerin. We will order EKG when the patient is calmer.  5. GERD. She is on Protonix.  6. COPD. She is on [place].  7. Urinary retention. She is on Flomax.  8. Insomnia. Trazodone is available.  9. Metabolic syndrome monitoring. Lipid panel and TSH are normal. Hemoglobin A1c pending.   10. B12 deficiency. B12 level is low. We will start B12 injections.   11. Disposition. She will be discharged to home with her husband. She will follow up with mental health professionals.  Orson Slick, MD 10/04/2016, 11:05 AM

## 2016-10-04 NOTE — Progress Notes (Signed)
Patient's BP 213/119 HR 97. MD notified. Orders put in for Hydralizine 50 mg. Norvasc 5 mg, and lisinopril 20 mg.

## 2016-10-04 NOTE — Progress Notes (Signed)
Pt has been asleep in her room all afternoon. Did wake and eat dinner. Refused to take 1700 dose of xanax as ordered "because I just want to sleep." Safety maintained with every 15 minute checks. Will continue to monitor.

## 2016-10-04 NOTE — Progress Notes (Signed)
PO BP meds offerred and patient accepted.

## 2016-10-04 NOTE — Plan of Care (Signed)
Problem: Medication: Goal: Compliance with prescribed medication regimen will improve Outcome: Progressing Pt compliant with MUCH encouragement during medication administration. Continues to want to refuse because "I have too much water on me."

## 2016-10-05 MED ORDER — OLANZAPINE 5 MG PO TBDP
15.0000 mg | ORAL_TABLET | Freq: Two times a day (BID) | ORAL | Status: DC
  Administered 2016-10-08 – 2016-10-19 (×19): 15 mg via ORAL
  Filled 2016-10-05 (×26): qty 3

## 2016-10-05 MED ORDER — DIVALPROEX SODIUM 500 MG PO DR TAB
500.0000 mg | DELAYED_RELEASE_TABLET | Freq: Three times a day (TID) | ORAL | Status: DC
Start: 2016-10-05 — End: 2016-10-12
  Administered 2016-10-09 – 2016-10-11 (×2): 500 mg via ORAL
  Filled 2016-10-05 (×9): qty 1

## 2016-10-05 NOTE — [facility] Group Notes (Signed)
[place] LCSW Group Therapy   10/05/2016  9:30 AM  Type of Therapy: Group Therapy   Participation Level: Did Not Attend. Patient invited to participate but declined.    Alphonse Guild. Abdalla Naramore, MSW, LCSWA, LCAS

## 2016-10-05 NOTE — Progress Notes (Signed)
[facility] MD Progress Note  10/05/2016 10:25 AM TEANDREA CUBERO  MRN:  [id number]  Subjective:  10/05/2015 Ms. Bourdon accepted medications once last night and this morning with much encouragement but still complains of the bed. She however is no longer roaming the hallways panting and graunting. She still complains of being "full of water". Her blood pressure was elevated this morning and she received additional antihypertensives with normalization of blood pressure. She did not take her antihypertensives for at least 2 days prior.   10/06/2015 Ms. Galperin is floridly psychotic. She is hallucinating, intrusive and  easily irritated but redirectable. She takes medications only with out most encouragement. Yesterday she smeared feces all over her room.  Per nursing: D: Pt denies SI/HI/AVH but noted responding to internal stimuli. Pt is irritable, angry, hostile, verbally aggressive using profanities towards staff members and uncooperative with treatment plan. Pt was noted playing with faeces in the bathroom and staff had to give her a shower. Patient refused medication and staff had to repeatedly offer her medication and she eventually complied  Patient appears anxious,restless and she is not interacting with peers and staff appropriately.  A: Pt was offered support and encouragement. Pt was given scheduled medications, checks were done for safety.  R:Pt is taking medication.Pt is not receptive to treatment.  Safety maintained on unit will continue to closely monitor.   Principal Problem: Severe recurrent major depression with psychotic features [facility]) Diagnosis:   Patient Active Problem List   Diagnosis Date Noted  . Severe recurrent major depression with psychotic features ([place]) [F33.3] 10/02/2016  . Benzodiazepine withdrawal ([place]) [F13.239] 10/02/2016  . 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 Abdominal pain [R10.9] 04/19/2016  . Sepsis ([place]) [A41.9] 03/09/2016  . Urinary retention [R33.9] 03/09/2016   . CAD (coronary artery disease) [I25.10] 03/09/2016  . Frank hematuria [R31.0] 03/08/2016  . Temporary cerebral vascular dysfunction [G93.9] 02/03/2016  . Peripheral vascular disease ([place]) [I73.9] 02/03/2016  . BP (high blood pressure) [I10] 02/03/2016  . HLD (hyperlipidemia) [E78.5] 02/03/2016  . Clinical depression [F32.9] 02/03/2016  . Carotid artery narrowing [I65.29] 02/03/2016  . 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 Malignant neoplastic disease ([place]) [C80.1] 02/03/2016  . Anxiety [F41.9] 02/03/2016  . Abdominal pain, generalized [R10.84]   . Nausea with vomiting [R11.2]   . Gastritis [K29.70]   . Esophageal candidiasis ([place]) [B37.81]   . Noninfectious diarrhea [K52.9]   . Intractable pain [R52] 01/21/2016  . Upper abdominal pain [R10.10]   . Nausea and vomiting in adult [R11.2]   . Ischemic colitis ([place]) [K55.9] 01/01/2016  . Colitis [K52.9] 12/15/2015  . GI bleed [K92.2] 12/15/2015  . Acute inflammation of the pancreas [K85.90] 12/06/2015  . Vitamin B12 deficiency anemia [D51.9] 02/25/2015  . Absolute anemia [D64.9] 02/25/2015  . Carotid stenosis [I65.29] 02/10/2015  . Personal history of other specified conditions [Z87.898] 11/27/2014  . Osteomyelitis ([place]) [M86.9] 11/27/2014  . H/O coronary artery bypass surgery [Z95.1] 05/18/2014   Total Time spent with patient: 20 minutes  Past Psychiatric History: depression.  Past Medical History:  Past Medical History:  Diagnosis Date  . Anemia   . Anginal pain ([place])   . Anxiety   . 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  This was done at [facility]  . BP (high blood pressure) 02/03/2016  . Cancer ([place])    melanoma skin cancer  . Carotid stenosis 02/10/2015  . Chronic kidney disease    UTI  . Colitis 12/15/2015  . Collagen vascular disease ([place])   . COPD (chronic obstructive pulmonary disease) ([place])   . Coronary artery disease   . Depression   . Esophageal candidiasis ([place])   . Gastritis   . GERD (gastroesophageal reflux disease)   . GI bleed 12/15/2015  . Headache   . Hypertension   . Ischemic colitis ([place]) 01/01/2016   Overview:   SMA mesenteric ischemia now status post angioplasty by vascular of In stent restenosis.   Overview:  S?P SMA angioplasty   . Myocardial infarction   . Nausea and vomiting in adult   . Noninfectious diarrhea   . Peripheral vascular disease ([place]) 02/03/2016   Overview:  Follows with Dr. Delana Meyer    . Shortness of breath dyspnea    with exertion  . Stroke [facility])    TIA X 2  . Temporary cerebral vascular dysfunction 02/03/2016    Past Surgical History:  Procedure Laterality Date  . ABDOMINAL HYSTERECTOMY    . BACK SURGERY    . BREAST BIOPSY Left 6 2017   results not in yet  . COLONOSCOPY WITH PROPOFOL N/A 01/24/2016   Procedure: COLONOSCOPY WITH PROPOFOL;  Surgeon: Lucilla Lame, MD;  Location: [facility] ENDOSCOPY;  Service: Endoscopy;  Laterality: N/A;  . CORONARY ANGIOPLASTY    . CORONARY ARTERY BYPASS GRAFT    . ESOPHAGOGASTRODUODENOSCOPY (EGD) WITH PROPOFOL N/A 01/24/2016   Procedure: ESOPHAGOGASTRODUODENOSCOPY (EGD) WITH PROPOFOL;  Surgeon: Lucilla Lame, MD;  Location: [facility] ENDOSCOPY;  Service: Endoscopy;  Laterality: N/A;  . KYPHOPLASTY N/A 02/08/2016   Procedure: KYPHOPLASTY L1;  Surgeon: Hessie Knows, MD;  Location: [facility] ORS;  Service: Orthopedics;  Laterality: N/A;  . KYPHOPLASTY N/A 03/28/2016   Procedure: KYPHOPLASTY;  Surgeon: Hessie Knows, MD;  Location: [facility] ORS;  Service: Orthopedics;  Laterality: N/A;  . NOSE SURGERY     cancer removal  . OTHER  SURGICAL HISTORY  Jul 11 2014   sternum removal  . PERIPHERAL VASCULAR CATHETERIZATION N/A 02/10/2015   Procedure: Carotid PTA/Stent Intervention;  Surgeon: Katha Cabal, MD;  Location: [place] CV LAB;  Service: Cardiovascular;  Laterality: N/A;  . PERIPHERAL VASCULAR CATHETERIZATION N/A 12/17/2015   Procedure: Visceral Angiography;  Surgeon: Katha Cabal, MD;  Location: [place] [facility] LAB;  Service: Cardiovascular;  Laterality: N/A;  . PERIPHERAL VASCULAR CATHETERIZATION N/A 12/17/2015   Procedure: Visceral Artery Intervention;  Surgeon: Katha Cabal, MD;  Location: [place];  Service: Cardiovascular;  Laterality: N/A;  . STENT PLACEMENT VASCULAR ([place] HX)     Family History:  Family History  Problem Relation Age of Onset  . Breast cancer Sister   . CAD Father   . CAD Brother    Family Psychiatric  History: see H&P. Social History:  History  Alcohol Use No     History  Drug Use No    Social History   Social History  . Marital status: Married    Spouse name: N/A  . Number of children: N/A  . Years of education: N/A   Social History Main Topics  . Smoking status: Former Smoker  Packs/day: 1.00    Types: Cigarettes    Quit date: 08/01/2012  . Smokeless tobacco: Never Used  . Alcohol use No  . Drug use: No  . Sexual activity: Yes   Other Topics Concern  . None   Social History Narrative  . None   Additional Social History:                         Sleep: Fair  Appetite:  Poor  Current Medications: Current Facility-Administered Medications  Medication Dose Route Frequency Provider Last Rate Last Dose  . acetaminophen (TYLENOL) tablet 650 mg  650 mg Oral Q6H PRN Gonzella Lex, MD      . alum & mag hydroxide-simeth (MAALOX/MYLANTA) 200-200-20 MG/5ML suspension 30 mL  30 mL Oral Q4H PRN Gonzella Lex, MD      . amLODipine (NORVASC) tablet 10 mg  10 mg Oral Daily Brynden Thune B Romana Deaton, MD      . atorvastatin (LIPITOR)  tablet 80 mg  80 mg Oral q1800 Gonzella Lex, MD   80 mg at 10/04/16 2100  . clopidogrel (PLAVIX) tablet 75 mg  75 mg Oral Daily Gonzella Lex, MD   75 mg at 10/04/16 [id number]  . cyanocobalamin ((VITAMIN B-12)) injection 1,000 mcg  1,000 mcg Intramuscular Daily Davisha Linthicum B Flint Hakeem, MD      . diphenhydrAMINE (BENADRYL) capsule 50 mg  50 mg Oral Q6H PRN Montrae Braithwaite B Shantina Chronister, MD       Or  . diphenhydrAMINE (BENADRYL) injection 50 mg  50 mg Intramuscular Q6H PRN Maddeline Roorda B Dysen Edmondson, MD   50 mg at 10/05/16 0850  . divalproex (DEPAKOTE) DR tablet 500 mg  500 mg Oral Q8H Alanee Ting B Anthoni Geerts, MD      . fluvoxaMINE (LUVOX) tablet 100 mg  100 mg Oral QHS Nova Evett B Emory Gallentine, MD   100 mg at 10/04/16 2214  . furosemide (LASIX) tablet 20 mg  20 mg Oral Daily Lakena Sparlin B Sebastian Dzik, MD   20 mg at 10/04/16 0839  . haloperidol (HALDOL) tablet 5 mg  5 mg Oral Q6H PRN Lasheika Ortloff B Marcell Pfeifer, MD       Or  . haloperidol lactate (HALDOL) injection 5 mg  5 mg Intramuscular Q6H PRN Clovis Fredrickson, MD   5 mg at 10/05/16 0850  . LORazepam (ATIVAN) tablet 2 mg  2 mg Oral Q6H PRN Clovis Fredrickson, MD       Or  . LORazepam (ATIVAN) injection 2 mg  2 mg Intramuscular Q6H PRN Clovis Fredrickson, MD   2 mg at 10/05/16 0850  . magnesium hydroxide (MILK OF MAGNESIA) suspension 30 mL  30 mL Oral Daily PRN Gonzella Lex, MD      . mometasone-formoterol [facility]) 200-5 MCG/ACT inhaler 2 puff  2 puff Inhalation BID Gonzella Lex, MD   2 puff at 10/04/16 2100  . nitroGLYCERIN (NITROSTAT) SL tablet 0.4 mg  0.4 mg Sublingual Q5 min PRN Gonzella Lex, MD      . OLANZapine zydis (ZYPREXA) disintegrating tablet 15 mg  15 mg Oral BID AC & HS Tresia Revolorio B Nastassia Bazaldua, MD      . pantoprazole (PROTONIX) EC tablet 40 mg  40 mg Oral Daily Gonzella Lex, MD   40 mg at 10/04/16 [id number]  . potassium chloride SA (K-DUR,KLOR-CON) CR tablet 10 mEq  10 mEq Oral Daily Clovis Fredrickson, MD   10 mEq at 10/04/16 0838  . tamsulosin (FLOMAX)  capsule 0.4 mg  0.4 mg Oral Daily Clovis Fredrickson, MD   0.4 mg at 10/04/16 0838  . traZODone (DESYREL) tablet 50 mg  50 mg Oral QHS Gonzella Lex, MD   50 mg at 10/04/16 2214    Lab Results:  No results found for this or any previous visit (from the past 57 hour(s)).  Blood Alcohol level:  Lab Results  Component Value Date   ETH <5 10/02/2016   ETH <5 123456    Metabolic Disorder Labs: Lab Results  Component Value Date   HGBA1C 6.8 (H) 04/19/2016   No results found for: PROLACTIN Lab Results  Component Value Date   CHOL 134 10/03/2016   TRIG 88 10/03/2016   HDL 53 10/03/2016   CHOLHDL 2.5 10/03/2016   VLDL 18 10/03/2016   LDLCALC 63 10/03/2016    Physical Findings: AIMS:  , ,  ,  ,    CIWA:    COWS:     Musculoskeletal: Strength & Muscle Tone: within normal limits Gait & Station: normal Patient leans: N/A  Psychiatric Specialty Exam: Physical Exam  Nursing note and vitals reviewed.   Review of Systems  Psychiatric/Behavioral: Positive for hallucinations and suicidal ideas. The patient is nervous/anxious.   All other systems reviewed and are negative.   Blood pressure (!) 139/92, pulse 78, temperature 97.9 F (36.6 C), temperature source Oral, resp. rate 18, height 5\' 1"  (1.549 m), weight 71.2 kg (157 lb), SpO2 99 %.Body mass index is 29.66 kg/m.  General Appearance: Disheveled  Eye Contact:  Good  Speech:  Pressured  Volume:  Increased  Mood:  Anxious  Affect:  Congruent  Thought Process:  Disorganized and Descriptions of Associations: Loose  Orientation:  Full (Time, Place, and Person)  Thought Content:  Illogical, Delusions, Paranoid Ideation and Rumination  Suicidal Thoughts:  Yes.  with intent/plan  Homicidal Thoughts:  No  Memory:  Immediate;   Poor Recent;   Poor Remote;   Poor  Judgement:  Poor  Insight:  Lacking  Psychomotor Activity:  Increased  Concentration:  Concentration: Poor  Recall:  Poor  Fund of Knowledge:  Fair   Language:  Fair  Akathisia:  No  Handed:  Right  AIMS (if indicated):     Assets:  Communication Skills Desire for Improvement Financial Resources/Insurance Housing Resilience Social Support  ADL's:  Intact  Cognition:  WNL  Sleep:  Number of Hours: 6     Treatment Plan Summary: Daily contact with patient to assess and evaluate symptoms and progress in treatment and Medication management   Ms. Chrisco is a 56 year old female with history of depression admitted for worsening of her symptoms, paranoia and suicidal threats in the context of treatment noncompliance.  Agitation. Haldol, Ativan and Benadryl are available per forced medication order.  1. Suicidal ideation. The patient is able to contract for safety in the hospital.  2. Mood, anxiety and psychosis. We started Zyprexa for psychosis and Luvox for depression and anxiety. The patient has trichotillomania for the past few weeks. We increased Zyprexa to 15 mg twice daily and started Depakote for mood stabilization.   3. Hypertension. She is on lisinopril, furosemide, and potassium. Blood pressure is only slightly elevated.  4. Coronary artery disease. She is on Lipitor and nitroglycerin. We will order EKG when the patient is calmer.  5. GERD. She is on Protonix.  6. COPD. She is on [place].  7. Urinary retention. She is on Flomax.  8. Insomnia. Trazodone is available.  9. Metabolic syndrome monitoring. Lipid panel and TSH are normal. Hemoglobin A1c pending.   10. B12 deficiency. B12 level is low. We will start B12 injections.   11. Disposition. She will be discharged to home with her husband. She will follow up with mental health professionals.  Orson Slick, MD 10/05/2016, 10:25 AM

## 2016-10-05 NOTE — Progress Notes (Signed)
Recreation Therapy Notes  Date: 01.04.18 Time: 1:00 pm Location: Craft Room  Group Topic: Leisure Education  Goal Area(s) Addresses:  Patient will identify things they are grateful for. Patient will identify how being grateful can influence decision making.  Behavioral Response: Did not attend  Intervention: Grateful Wheel  Activity: Patients were given an I Am Grateful For worksheet and were instructed to write things they are grateful for under each category.  Education: LRT educated patients on why it is important to be grateful.  Education Outcome: Patient did not attend group.  Clinical Observations/Feedback: Patient did not attend group.  Leonette Monarch, LRT/CTRS 10/05/2016 2:10 PM

## 2016-10-05 NOTE — Progress Notes (Signed)
Patient delusional. Informs MHT that she cannot eat because she has a frog in her throat that she cannot get up.  Repeats continuously  "No more water, I am full of water"  Refuses her medications.  Requires constant redirection.  Irritable and agitated.  Medicated x1 with Bendadryl 50 mg, Ativan 1 mg and Haldol 5 mg IM x1.  Support and encouragement offered. Safety maintained.

## 2016-10-05 NOTE — Tx Team (Signed)
Interdisciplinary Treatment and Diagnostic Plan Update  10/05/2016 Time of Session: 10:30am Kirsten Castro MRN: [id number]  Principal Diagnosis: Severe recurrent major depression with psychotic features [facility])  Secondary Diagnoses: Principal Problem:   Severe recurrent major depression with psychotic features ([place]) Active Problems:   CAD (coronary artery disease)   Benzodiazepine withdrawal ([place])   Trichotillomania   Current Medications:  Current Facility-Administered Medications  Medication Dose Route Frequency Provider Last Rate Last Dose  . acetaminophen (TYLENOL) tablet 650 mg  650 mg Oral Q6H PRN Gonzella Lex, MD      . alum & mag hydroxide-simeth (MAALOX/MYLANTA) 200-200-20 MG/5ML suspension 30 mL  30 mL Oral Q4H PRN Gonzella Lex, MD      . amLODipine (NORVASC) tablet 10 mg  10 mg Oral Daily [name], MD      . atorvastatin (LIPITOR) tablet 80 mg  80 mg Oral q1800 Gonzella Lex, MD   80 mg at 10/04/16 2100  . clopidogrel (PLAVIX) tablet 75 mg  75 mg Oral Daily Gonzella Lex, MD   75 mg at 10/04/16 [id number]  . cyanocobalamin ((VITAMIN B-12)) injection 1,000 mcg  1,000 mcg Intramuscular Daily [name], MD      . diphenhydrAMINE (BENADRYL) capsule 50 mg  50 mg Oral Q6H PRN [name], MD       Or  . diphenhydrAMINE (BENADRYL) injection 50 mg  50 mg Intramuscular Q6H PRN [name], MD   50 mg at 10/05/16 0850  . divalproex (DEPAKOTE) DR tablet 500 mg  500 mg Oral Q8H [name], MD      . fluvoxaMINE (LUVOX) tablet 100 mg  100 mg Oral QHS [name], MD   100 mg at 10/04/16 2214  . furosemide (LASIX) tablet 20 mg  20 mg Oral Daily [name], MD   20 mg at 10/04/16 0839  . haloperidol (HALDOL) tablet 5 mg  5 mg Oral Q6H PRN [name], MD       Or  . haloperidol lactate (HALDOL) injection 5 mg  5 mg Intramuscular Q6H PRN Clovis Fredrickson, MD   5 mg at 10/05/16 0850  . LORazepam (ATIVAN)  tablet 2 mg  2 mg Oral Q6H PRN Clovis Fredrickson, MD       Or  . LORazepam (ATIVAN) injection 2 mg  2 mg Intramuscular Q6H PRN Clovis Fredrickson, MD   2 mg at 10/05/16 0850  . magnesium hydroxide (MILK OF MAGNESIA) suspension 30 mL  30 mL Oral Daily PRN Gonzella Lex, MD      . mometasone-formoterol [facility]) 200-5 MCG/ACT inhaler 2 puff  2 puff Inhalation BID Gonzella Lex, MD   2 puff at 10/04/16 2100  . nitroGLYCERIN (NITROSTAT) SL tablet 0.4 mg  0.4 mg Sublingual Q5 min PRN Gonzella Lex, MD      . OLANZapine zydis (ZYPREXA) disintegrating tablet 15 mg  15 mg Oral BID AC & HS [name], MD      . pantoprazole (PROTONIX) EC tablet 40 mg  40 mg Oral Daily Gonzella Lex, MD   40 mg at 10/04/16 [id number]  . potassium chloride SA (K-DUR,KLOR-CON) CR tablet 10 mEq  10 mEq Oral Daily Clovis Fredrickson, MD   10 mEq at 10/04/16 0838  . tamsulosin (FLOMAX) capsule 0.4 mg  0.4 mg Oral Daily [name], MD   0.4 mg at 10/04/16 0838  . traZODone (DESYREL) tablet 50  mg  50 mg Oral QHS Gonzella Lex, MD   50 mg at 10/04/16 2214   PTA Medications: Facility-Administered Medications Prior to Admission  Medication Dose Route Frequency Provider Last Rate Last Dose  . lidocaine (XYLOCAINE) 2 % jelly 1 application  1 application Urethral Once Nickie Retort, MD       Prescriptions Prior to Admission  Medication Sig Dispense Refill Last Dose  . acetaminophen (TYLENOL) 500 MG tablet Take 1,000 mg by mouth every 6 (six) hours as needed for mild pain or headache.   Taking  . ALPRAZolam (XANAX) 1 MG tablet Take 1 mg by mouth 3 (three) times daily as needed for anxiety or sleep.    Taking  . atorvastatin (LIPITOR) 80 MG tablet Take 80 mg by mouth daily.   Taking  . cetirizine (ZYRTEC) 10 MG tablet Take 10 mg by mouth daily. Reported on 02/08/2016   Not Taking  . clopidogrel (PLAVIX) 75 MG tablet Take 75 mg by mouth daily.    Taking  . clotrimazole (LOTRIMIN) 1 % cream APP EXT AA BID    Not Taking  . clotrimazole-betamethasone (LOTRISONE) cream Apply topically.   Not Taking  . cyclobenzaprine (FLEXERIL) 10 MG tablet Take 10 mg by mouth 3 (three) times daily as needed for muscle spasms.    Taking  . Fluticasone-Salmeterol (ADVAIR DISKUS) 250-50 MCG/DOSE AEPB Inhale 1 puff into the lungs 2 (two) times daily.    Taking  . furosemide (LASIX) 20 MG tablet Take 20 mg by mouth.   Taking  . hydrocortisone (ANUSOL-HC) 2.5 % rectal cream Apply topically.   Not Taking  . lisinopril (ZESTRIL) 10 MG tablet Take 1 tablet (10 mg total) by mouth daily. 60 tablet 1 Taking  . Magnesium 250 MG TABS Take 250 mg by mouth 2 (two) times daily.   Taking  . ondansetron (ZOFRAN) 4 MG tablet Take 1 tablet (4 mg total) by mouth daily as needed for nausea or vomiting. 20 tablet 1   . ondansetron (ZOFRAN-ODT) 4 MG disintegrating tablet Take 4 mg by mouth every 8 (eight) hours as needed.    Taking  . pantoprazole (PROTONIX) 40 MG tablet Take 1 tablet (40 mg total) by mouth 2 (two) times daily. 60 tablet 1 Taking  . phenazopyridine (PYRIDIUM) 100 MG tablet    Not Taking  . polyethylene glycol powder (GLYCOLAX/MIRALAX) powder    Not Taking  . potassium chloride (MICRO-K) 10 MEQ CR capsule Take 10 mEq by mouth 2 (two) times daily.    Taking  . senna-docusate (SENOKOT-S) 8.6-50 MG tablet Take 1 tablet by mouth at bedtime as needed for mild constipation.   Taking  . tamsulosin (FLOMAX) 0.4 MG CAPS capsule TAKE ONE CAPSULE BY MOUTH DAILY WITH BREAKFAST   Not Taking  . traZODone (DESYREL) 50 MG tablet Take 50 mg by mouth at bedtime as needed.   Taking    Patient Stressors: Health problems Medication change or noncompliance  Patient Strengths: Ability for insight Capable of independent living Supportive family/friends  Treatment Modalities: Medication Management, Group therapy, Case management,  1 to 1 session with clinician, Psychoeducation, Recreational therapy.   Physician Treatment Plan for Primary  Diagnosis: Severe recurrent major depression with psychotic features ([place]) Long Term Goal(s): Improvement in symptoms so as ready for discharge NA   Short Term Goals: Ability to identify changes in lifestyle to reduce recurrence of condition will improve Ability to verbalize feelings will improve Ability to disclose and discuss suicidal ideas Ability to  demonstrate self-control will improve Ability to identify and develop effective coping behaviors will improve Ability to maintain clinical measurements within normal limits will improve Compliance with prescribed medications will improve Ability to identify triggers associated with substance abuse/mental health issues will improve NA  Medication Management: Evaluate patient's response, side effects, and tolerance of medication regimen.  Therapeutic Interventions: 1 to 1 sessions, Unit Group sessions and Medication administration.  Evaluation of Outcomes: Progressing  Physician Treatment Plan for Secondary Diagnosis: Principal Problem:   Severe recurrent major depression with psychotic features ([place]) Active Problems:   CAD (coronary artery disease)   Benzodiazepine withdrawal ([place])   Trichotillomania  Long Term Goal(s): Improvement in symptoms so as ready for discharge NA   Short Term Goals: Ability to identify changes in lifestyle to reduce recurrence of condition will improve Ability to verbalize feelings will improve Ability to disclose and discuss suicidal ideas Ability to demonstrate self-control will improve Ability to identify and develop effective coping behaviors will improve Ability to maintain clinical measurements within normal limits will improve Compliance with prescribed medications will improve Ability to identify triggers associated with substance abuse/mental health issues will improve NA     Medication Management: Evaluate patient's response, side effects, and tolerance of medication regimen.  Therapeutic  Interventions: 1 to 1 sessions, Unit Group sessions and Medication administration.  Evaluation of Outcomes: Progressing   RN Treatment Plan for Primary Diagnosis: Severe recurrent major depression with psychotic features ([place]) Long Term Goal(s): Knowledge of disease and therapeutic regimen to maintain health will improve  Short Term Goals: Ability to remain free from injury will improve, Ability to verbalize frustration and anger appropriately will improve, Ability to demonstrate self-control, Ability to participate in decision making will improve, Ability to verbalize feelings will improve and Ability to identify and develop effective coping behaviors will improve  Medication Management: RN will administer medications as ordered by provider, will assess and evaluate patient's response and provide education to patient for prescribed medication. RN will report any adverse and/or side effects to prescribing provider.  Therapeutic Interventions: 1 on 1 counseling sessions, Psychoeducation, Medication administration, Evaluate responses to treatment, Monitor vital signs and CBGs as ordered, Perform/monitor CIWA, COWS, AIMS and Fall Risk screenings as ordered, Perform wound care treatments as ordered.  Evaluation of Outcomes: Progressing   LCSW Treatment Plan for Primary Diagnosis: Severe recurrent major depression with psychotic features ([place]) Long Term Goal(s): Safe transition to appropriate next level of care at discharge, Engage patient in therapeutic group addressing interpersonal concerns.  Short Term Goals: Engage patient in aftercare planning with referrals and resources, Increase social support, Increase ability to appropriately verbalize feelings, Increase emotional regulation, Facilitate acceptance of mental health diagnosis and concerns and Increase skills for wellness and recovery  Therapeutic Interventions: Assess for all discharge needs, 1 to 1 time with Social worker, Explore  available resources and support systems, Assess for adequacy in community support network, Educate family and significant other(s) on suicide prevention, Complete Psychosocial Assessment, Interpersonal group therapy.  Evaluation of Outcomes: Progressing   Progress in Treatment: Attending groups: No. Participating in groups: No. Taking medication as prescribed: Yes. Toleration medication: Yes. Family/Significant other contact made: No, will contact:  identified support person, husband Patient understands diagnosis: Yes. Discussing patient identified problems/goals with staff: No. Patient has not been participated in treatment team meetings.  Medical problems stabilized or resolved: Yes. Denies suicidal/homicidal ideation: Yes. Issues/concerns per patient self-inventory: No. Other: n/a  New problem(s) identified: None identified at this time.   New Short  Term/Long Term Goal(s): None identified at this time.   Discharge Plan or Barriers: Patient will discharge home with her husband and follow-up with outpatient services for medication management and outpatient therapy.   Reason for Continuation of Hospitalization: Depression Medication stabilization  Estimated Length of Stay: 3 to 5 days.   Attendees: Patient: Kirsten Castro 10/05/2016 1:30 PM  Physician: Dr. Bary Leriche, MD 10/05/2016 1:30 PM  Nursing: Elige Radon, RN 10/05/2016 1:30 PM  RN Care Manager: 10/05/2016 1:30 PM  Social Worker: Jen Mow. Rose Fillers, [place] 10/05/2016 1:30 PM  Recreational Therapist: Leonette Monarch, LRT/CTRS 10/05/2016 1:30 PM  Other:  10/05/2016 1:30 PM  Other:  10/05/2016 1:30 PM  Other: 10/05/2016 1:30 PM    Scribe for Treatment Team: Jolaine Click, LCSWA 10/05/2016 1:36 PM

## 2016-10-05 NOTE — Plan of Care (Signed)
Problem: Coping: Goal: Ability to verbalize feelings will improve Outcome: Not Progressing Patient is not verbalizing feelings to staff.

## 2016-10-05 NOTE — [facility] Group Notes (Signed)
Goals Group  Date/Time: 10/05/2016 9am  Type of Therapy and Topic: Group Therapy: Goals Group: SMART Goals   Pt was called, but did not attend   Kirsten Castro. Silvano Garofano, LCSWA, LCAS

## 2016-10-05 NOTE — Progress Notes (Signed)
D: Pt denies SI/HI/AVH but noted responding to internal stimuli. Pt is irritable, angry, hostile, verbally aggressive using profanities towards staff members and uncooperative with treatment plan. Pt was noted playing with faeces in the bathroom and staff had to give her a shower. Patient refused medication and staff had to repeatedly offer her medication and she eventually complied  Patient appears anxious,restless and she is not interacting with peers and staff appropriately.  A: Pt was offered support and encouragement. Pt was given scheduled medications, checks were done for safety.  R:Pt is taking medication.Pt is not receptive to treatment.  Safety maintained on unit will continue to closely monitor.

## 2016-10-06 MED ORDER — HALOPERIDOL 5 MG PO TABS: 10.0000 mg | ORAL_TABLET | Freq: Every day | ORAL | Status: DC

## 2016-10-06 MED ORDER — DIPHENHYDRAMINE HCL 50 MG/ML IJ SOLN
50.0000 mg | Freq: Every day | INTRAMUSCULAR | Status: DC
  Administered 2016-10-06 – 2016-10-11 (×4): 50 mg via INTRAMUSCULAR
  Filled 2016-10-06 (×2): qty 1

## 2016-10-06 MED ORDER — HALOPERIDOL LACTATE 5 MG/ML IJ SOLN: 10.0000 mg | Freq: Every day | INTRAMUSCULAR | Status: DC

## 2016-10-06 MED ORDER — DIPHENHYDRAMINE HCL 25 MG PO CAPS
50.0000 mg | ORAL_CAPSULE | Freq: Every day | ORAL | Status: DC
  Administered 2016-10-08 – 2016-10-12 (×4): 50 mg via ORAL
  Filled 2016-10-06 (×5): qty 2

## 2016-10-06 MED ORDER — HALOPERIDOL 5 MG PO TABS
10.0000 mg | ORAL_TABLET | Freq: Every day | ORAL | Status: DC
  Administered 2016-10-08 – 2016-10-19 (×10): 10 mg via ORAL
  Filled 2016-10-06 (×12): qty 2

## 2016-10-06 MED ORDER — HALOPERIDOL LACTATE 5 MG/ML IJ SOLN
10.0000 mg | Freq: Every day | INTRAMUSCULAR | Status: DC
  Administered 2016-10-06 – 2016-10-13 (×4): 10 mg via INTRAMUSCULAR
  Filled 2016-10-06 (×4): qty 2

## 2016-10-06 NOTE — Progress Notes (Addendum)
[facility] MD Progress Note  10/06/2016 9:11 AM Kirsten Castro  MRN:  [id number]  Subjective:   Kirsten Castro has a history of depression, possibly bipolar disorder. She is floridly psychotic and refused medications twice in the past 24 hours. FORCED MEDICATION order since 10/04/2016.  10/04/2016 Kirsten Castro accepted medications once last night and this morning with much encouragement but still complains of the bed. She however is no longer roaming the hallways panting and graunting. She still complains of being "full of water". Her blood pressure was elevated this morning and she received additional antihypertensives with normalization of blood pressure. She did not take her antihypertensives for at least 2 days prior.   10/05/2016 Kirsten Castro is floridly psychotic. She is hallucinating, intrusive and  easily irritated but redirectable. She takes medications only with out most encouragement. Yesterday she smeared feces all over her room.  10/06/2016 Kirsten Castro is in bed refusing medications. She refused last night as well. She can not take any medicines becouse she can't swollow them as she is "full of water". She ate only 10% of her breakfast today. She refuses food because she is "full of water". She is loud and uncooperative this morning. She was toled that injections will be given. I will start Haldol injections.  Per nursing: Pt has spent most evening hours in her room refusing to comply with every request for routine care. Pt remains paranoid and out of contact with reality. She is unable to be reoriented. Pt continues to repeat that "I can't do this, I can't do this" said that she wants to get rid of the water in her body. Writer did not observe any sign of swelling or water pooling but pt continues trying to get ride of the water. Pt refused all her HS medication, reports that she's hungry yet refusing to eat. Pt is not observed to be agitated at this time as she stayed in bed for the most part of the  shift. Pt is currently not verbalizing any SI/HI, will continue to monitor, assess and encourage.  Kirsten Castro is still psychotic and unable to give her permission to contact her husband for collateral information.  Principal Problem: Severe recurrent major depression with psychotic features [facility]) Diagnosis:   Patient Active Problem List   Diagnosis Date Noted  . Severe recurrent major depression with psychotic features ([place]) [F33.3] 10/02/2016  . Benzodiazepine withdrawal ([place]) [F13.239] 10/02/2016  . 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 Abdominal pain [R10.9] 04/19/2016  . Sepsis ([place]) [A41.9] 03/09/2016  . Urinary retention [R33.9] 03/09/2016  . CAD (coronary artery disease) [I25.10] 03/09/2016  . Frank hematuria [R31.0] 03/08/2016  . Temporary cerebral vascular dysfunction [G93.9] 02/03/2016  . Peripheral vascular disease ([place]) [I73.9] 02/03/2016  . BP (high blood pressure) [I10] 02/03/2016  . HLD (hyperlipidemia) [E78.5] 02/03/2016  . Clinical depression [F32.9] 02/03/2016  . Carotid artery narrowing [I65.29] 02/03/2016  . 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 Malignant neoplastic disease ([place]) [C80.1] 02/03/2016  . Anxiety [F41.9] 02/03/2016  . Abdominal pain, generalized [R10.84]   . Nausea with vomiting [R11.2]   . Gastritis [K29.70]   . Esophageal candidiasis ([place]) [B37.81]   . Noninfectious diarrhea [K52.9]   . Intractable pain [R52] 01/21/2016  . Upper abdominal pain [R10.10]   . Nausea and vomiting in adult [R11.2]   . Ischemic colitis ([place]) [K55.9] 01/01/2016  . Colitis [K52.9] 12/15/2015  . GI bleed [K92.2] 12/15/2015  . Acute inflammation of the pancreas [K85.90] 12/06/2015  .  Vitamin B12 deficiency anemia [D51.9] 02/25/2015  . Absolute anemia [D64.9] 02/25/2015  . Carotid stenosis [I65.29] 02/10/2015  . Personal history of other specified conditions [Z87.898] 11/27/2014  . Osteomyelitis ([place]) [M86.9] 11/27/2014  . H/O  coronary artery bypass surgery [Z95.1] 05/18/2014   Total Time spent with patient: 20 minutes  Past Psychiatric History: depression.  Past Medical History:  Past Medical History:  Diagnosis Date  . Anemia   . Anginal pain ([place])   . Anxiety   . 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  This was done at [facility]  . BP (high blood pressure) 02/03/2016  . Cancer ([place])    melanoma skin cancer  . Carotid stenosis 02/10/2015  . Chronic kidney disease    UTI  . Colitis 12/15/2015  . Collagen vascular disease ([place])   . COPD (chronic obstructive pulmonary disease) ([place])   . Coronary artery disease   . Depression   . Esophageal candidiasis ([place])   . Gastritis   . GERD (gastroesophageal reflux disease)   . GI bleed 12/15/2015  . Headache   . Hypertension   . Ischemic colitis ([place]) 01/01/2016   Overview:   SMA mesenteric ischemia now status post angioplasty by vascular of In stent restenosis.   Overview:  S?P SMA angioplasty   . Myocardial infarction   . Nausea and vomiting in adult   . Noninfectious diarrhea   . Peripheral vascular disease ([place]) 02/03/2016   Overview:  Follows with Dr. Delana Meyer    . Shortness of breath dyspnea    with exertion  . Stroke [facility])    TIA X 2  . Temporary cerebral vascular dysfunction 02/03/2016    Past Surgical History:  Procedure Laterality Date  . ABDOMINAL HYSTERECTOMY    . BACK SURGERY    . BREAST BIOPSY Left 6 2017   results not in yet  . COLONOSCOPY WITH PROPOFOL N/A 01/24/2016   Procedure: COLONOSCOPY WITH PROPOFOL;  Surgeon: Lucilla Lame, MD;  Location: [facility]  ENDOSCOPY;  Service: Endoscopy;  Laterality: N/A;  . CORONARY ANGIOPLASTY    . CORONARY ARTERY BYPASS GRAFT    . ESOPHAGOGASTRODUODENOSCOPY (EGD) WITH PROPOFOL N/A 01/24/2016   Procedure: ESOPHAGOGASTRODUODENOSCOPY (EGD) WITH PROPOFOL;  Surgeon: Lucilla Lame, MD;  Location: [facility] ENDOSCOPY;  Service: Endoscopy;  Laterality: N/A;  . KYPHOPLASTY N/A 02/08/2016   Procedure: KYPHOPLASTY L1;  Surgeon: Hessie Knows, MD;  Location: [facility] ORS;  Service: Orthopedics;  Laterality: N/A;  . KYPHOPLASTY N/A 03/28/2016   Procedure: KYPHOPLASTY;  Surgeon: Hessie Knows, MD;  Location: [facility] ORS;  Service: Orthopedics;  Laterality: N/A;  . NOSE SURGERY     cancer removal  . OTHER SURGICAL HISTORY  Jul 11 2014   sternum removal  . PERIPHERAL VASCULAR CATHETERIZATION N/A 02/10/2015   Procedure: Carotid PTA/Stent Intervention;  Surgeon: Katha Cabal, MD;  Location: [place] CV LAB;  Service: Cardiovascular;  Laterality: N/A;  . PERIPHERAL VASCULAR CATHETERIZATION N/A 12/17/2015   Procedure: Visceral Angiography;  Surgeon: Katha Cabal, MD;  Location: [place] CV LAB;  Service: Cardiovascular;  Laterality: N/A;  . PERIPHERAL  VASCULAR CATHETERIZATION N/A 12/17/2015   Procedure: Visceral Artery Intervention;  Surgeon: Katha Cabal, MD;  Location: [place] [facility] LAB;  Service: Cardiovascular;  Laterality: N/A;  . STENT PLACEMENT VASCULAR ([place] HX)     Family History:  Family History  Problem Relation Age of Onset  . Breast cancer Sister   . CAD Father   . CAD Brother    Family Psychiatric  History: see H&P. Social History:  History  Alcohol Use No     History  Drug Use No    Social History   Social History  . Marital status: Married    Spouse name: N/A  . Number of children: N/A  . Years of education: N/A   Social History Main Topics  . Smoking status: Former Smoker    Packs/day: 1.00    Types: Cigarettes    Quit date: 08/01/2012  . Smokeless tobacco: Never Used  . Alcohol  use No  . Drug use: No  . Sexual activity: Yes   Other Topics Concern  . None   Social History Narrative  . None   Additional Social History:                         Sleep: Fair  Appetite:  Poor  Current Medications: Current Facility-Administered Medications  Medication Dose Route Frequency Provider Last Rate Last Dose  . acetaminophen (TYLENOL) tablet 650 mg  650 mg Oral Q6H PRN Gonzella Lex, MD      . alum & mag hydroxide-simeth (MAALOX/MYLANTA) 200-200-20 MG/5ML suspension 30 mL  30 mL Oral Q4H PRN Gonzella Lex, MD      . amLODipine (NORVASC) tablet 10 mg  10 mg Oral Daily Amardeep Beckers B Tarhonda Hollenberg, MD      . atorvastatin (LIPITOR) tablet 80 mg  80 mg Oral q1800 Gonzella Lex, MD   80 mg at 10/04/16 2100  . clopidogrel (PLAVIX) tablet 75 mg  75 mg Oral Daily Gonzella Lex, MD   75 mg at 10/04/16 [id number]  . cyanocobalamin ((VITAMIN B-12)) injection 1,000 mcg  1,000 mcg Intramuscular Daily Recie Cirrincione B Warnie Belair, MD      . diphenhydrAMINE (BENADRYL) capsule 50 mg  50 mg Oral Q6H PRN Justn Quale B Tiron Suski, MD       Or  . diphenhydrAMINE (BENADRYL) injection 50 mg  50 mg Intramuscular Q6H PRN Arianne Klinge B Derron Pipkins, MD   50 mg at 10/05/16 0850  . divalproex (DEPAKOTE) DR tablet 500 mg  500 mg Oral Q8H Inella Kuwahara B Menna Abeln, MD      . fluvoxaMINE (LUVOX) tablet 100 mg  100 mg Oral QHS Chele Cornell B Kesha Hurrell, MD   100 mg at 10/04/16 2214  . furosemide (LASIX) tablet 20 mg  20 mg Oral Daily Rihanna Marseille B Sheretha Shadd, MD   20 mg at 10/04/16 0839  . haloperidol (HALDOL) tablet 5 mg  5 mg Oral Q6H PRN Stefanee Mckell B [place]eta Mattice, MD       Or  . haloperidol lactate (HALDOL) injection 5 mg  5 mg Intramuscular Q6H PRN Clovis Fredrickson, MD   5 mg at 10/05/16 0850  . LORazepam (ATIVAN) tablet 2 mg  2 mg Oral Q6H PRN Clovis Fredrickson, MD       Or  . LORazepam (ATIVAN) injection 2 mg  2 mg Intramuscular Q6H PRN Clovis Fredrickson, MD   2 mg at 10/05/16 0850  . magnesium hydroxide (MILK OF  MAGNESIA) suspension 30 mL  30 mL  Oral Daily PRN Gonzella Lex, MD      . mometasone-formoterol [facility]) 200-5 MCG/ACT inhaler 2 puff  2 puff Inhalation BID Gonzella Lex, MD   2 puff at 10/04/16 2100  . nitroGLYCERIN (NITROSTAT) SL tablet 0.4 mg  0.4 mg Sublingual Q5 min PRN Gonzella Lex, MD      . OLANZapine zydis (ZYPREXA) disintegrating tablet 15 mg  15 mg Oral BID AC & HS Annessa Satre B Aidian Salomon, MD      . pantoprazole (PROTONIX) EC tablet 40 mg  40 mg Oral Daily Gonzella Lex, MD   40 mg at 10/04/16 [id number]  . potassium chloride SA (K-DUR,KLOR-CON) CR tablet 10 mEq  10 mEq Oral Daily Clovis Fredrickson, MD   10 mEq at 10/04/16 0838  . tamsulosin (FLOMAX) capsule 0.4 mg  0.4 mg Oral Daily Norberto Wishon B Brandin Stetzer, MD   0.4 mg at 10/04/16 0838  . traZODone (DESYREL) tablet 50 mg  50 mg Oral QHS Gonzella Lex, MD   50 mg at 10/04/16 2214    Lab Results:  No results found for this or any previous visit (from the past 85 hour(s)).  Blood Alcohol level:  Lab Results  Component Value Date   ETH <5 10/02/2016   ETH <5 123456    Metabolic Disorder Labs: Lab Results  Component Value Date   HGBA1C 6.8 (H) 04/19/2016   No results found for: PROLACTIN Lab Results  Component Value Date   CHOL 134 10/03/2016   TRIG 88 10/03/2016   HDL 53 10/03/2016   CHOLHDL 2.5 10/03/2016   VLDL 18 10/03/2016   LDLCALC 63 10/03/2016    Physical Findings: AIMS:  , ,  ,  ,    CIWA:    COWS:     Musculoskeletal: Strength & Muscle Tone: within normal limits Gait & Station: normal Patient leans: N/A  Psychiatric Specialty Exam: Physical Exam  Nursing note and vitals reviewed.   Review of Systems  Psychiatric/Behavioral: Positive for hallucinations and suicidal ideas. The patient is nervous/anxious.   All other systems reviewed and are negative.   Blood pressure (!) 156/96, pulse (!) 105, temperature 98.1 F (36.7 C), temperature source Oral, resp. rate 18, height 5\' 1"  (1.549 m), weight  71.2 kg (157 lb), SpO2 99 %.Body mass index is 29.66 kg/m.  General Appearance: Disheveled  Eye Contact:  Good  Speech:  Pressured  Volume:  Increased  Mood:  Anxious  Affect:  Congruent  Thought Process:  Disorganized and Descriptions of Associations: Loose  Orientation:  Full (Time, Place, and Person)  Thought Content:  Illogical, Delusions, Paranoid Ideation and Rumination  Suicidal Thoughts:  Yes.  with intent/plan  Homicidal Thoughts:  No  Memory:  Immediate;   Poor Recent;   Poor Remote;   Poor  Judgement:  Poor  Insight:  Lacking  Psychomotor Activity:  Increased  Concentration:  Concentration: Poor  Recall:  Poor  Fund of Knowledge:  Fair  Language:  Fair  Akathisia:  No  Handed:  Right  AIMS (if indicated):     Assets:  Communication Skills Desire for Improvement Financial Resources/Insurance Housing Resilience Social Support  ADL's:  Intact  Cognition:  WNL  Sleep:  Number of Hours: 7.3     Treatment Plan Summary: Daily contact with patient to assess and evaluate symptoms and progress in treatment and Medication management   Ms. Hoselton is a 56 year old female with history of depression admitted for worsening of her symptoms, paranoia and suicidal  threats in the context of treatment noncompliance.  Agitation. Haldol, Ativan and Benadryl are available per forced medication order.  1. Suicidal ideation. The patient is able to contract for safety in the hospital.  2. Mood, anxiety and psychosis. We started Zyprexa for psychosis, Depakote for mood stabilization and Luvox for depression, anxiety, and trichotillomania. She refused medications twice in the past 24 hours. We will start Haldol injections.   3. Hypertension. She is on lisinopril, furosemide, and potassium. She refused all medications in the past 24 hours. Blood pressure is only slightly elevated.  4. Coronary artery disease. She is on Lipitor and nitroglycerin. We will order EKG when the  patient is calmer.  5. GERD. She is on Protonix.  6. COPD. She is on [place].  7. Urinary retention. She is on Flomax.  8. Insomnia. Trazodone is available.  9. Metabolic syndrome monitoring. Lipid panel and TSH are normal. Hemoglobin A1c pending.   10. B12 deficiency. B12 level is low. We tried B12 injections but the patient refuses.  11. Disposition. She will be discharged to home with her husband. She will follow up with mental health professionals.  Orson Slick, MD 10/06/2016, 9:11 AM

## 2016-10-06 NOTE — Progress Notes (Signed)
Pt has spent most evening hours in her room refusing to comply with every request for routine care. Pt remains paranoid and out of contact with reality. She is unable to be reoriented. Pt continues to repeat that "I can't do this, I can't do this" said that she wants to get rid of the water in her body. Writer did not observe any sign of swelling or water pooling but pt continues trying to get ride of the water. Pt refused all her HS medication, reports that she's hungry yet refusing to eat. Pt is not observed to be agitated at this time as she stayed in bed for the most part of the shift. Pt is currently not verbalizing any SI/HI, will continue to monitor, assess and encourage.

## 2016-10-06 NOTE — BHH Counselor (Signed)
Adult Comprehensive Assessment  Patient ID: Kirsten Castro, female   DOB: [birth year], 56 y.o.   MRN: [id number]  Information Source: Information source:  (pt's husband-pt incapacitated)  Current Stressors:  Educational / Learning stressors: none reported Employment / Job issues: no employment-disabled Family Relationships: none reported Museum/gallery curator / Lack of resources (include bankruptcy): none reported Housing / Lack of housing: none reported Physical health (include injuries & life threatening diseases): heart surgery and long term hospitalization 2015 Social relationships: none reported Substance abuse: none reported Bereavement / Loss: none reported  Living/Environment/Situation:  Living Arrangements: Spouse/significant other Living conditions (as described by patient or guardian): positive, supportive How long has patient lived in current situation?: 4 years in this home What is atmosphere in current home: Loving, Supportive  Family History:  Marital status: Married Number of Years Married: 38 What types of issues is patient dealing with in the relationship?: none per husband Does patient have children?: Yes How many children?: 2 How is patient's relationship with their children?: both kids in [place], gets along fine  Childhood History:  By whom was/is the patient raised?: Both parents Additional childhood history information: pt married at age 51.  Pt's father drank too much and eventually left the family Description of patient's relationship with caregiver when they were a child: good relationship with mother, father mostly absent Patient's description of current relationship with people who raised him/her: father now deceased, mother still alive in 57.  Good relationship. How were you disciplined when you got in trouble as a child/adolescent?: appropriate Does patient have siblings?: Yes Number of Siblings: 5 Description of patient's current relationship  with siblings: 2 brothers, 3 sisters.  One brother deceased.  Not a lot of contact with sibs but no conflict. Did patient suffer any verbal/emotional/physical/sexual abuse as a child?: No Did patient suffer from severe childhood neglect?: No Has patient ever been sexually abused/assaulted/raped as an adolescent or adult?: No Was the patient ever a victim of a crime or a disaster?: No Witnessed domestic violence?: Yes Has patient been effected by domestic violence as an adult?: No Description of domestic violence: alcoholic father was violent towards pt's mother  Education:  Highest grade of school patient has completed: 11th grade Currently a student?: No Learning disability?: No  Employment/Work Situation:   Employment situation: On disability Why is patient on disability: heart surgery and complications How long has patient been on disability: 2015 What is the longest time patient has a held a job?: 5 years, worked with husband Where was the patient employed at that time?: husband was self employed Has patient ever been in the [employer]?: No Are There Guns or Other Weapons in [place]?: No  Financial Resources:   Financial resources: Income from spouse, Receives SSI Does patient have a representative payee or guardian?: No  Alcohol/Substance Abuse:   What has been your use of drugs/alcohol within the last 12 months?: none reported Alcohol/Substance Abuse Treatment Hx: Denies past history Has alcohol/substance abuse ever caused legal problems?: No  Social Support System:   Pensions consultant Support System: Good Describe Community Support System: husband, children Type of faith/religion: na How does patient's faith help to cope with current illness?: na  Leisure/Recreation:   Leisure and Hobbies: motorcycle rides, time with family, beach  Strengths/Needs:   What things does the patient do well?: good housecleaner, good with numbers In what areas does patient struggle /  problems for patient: "keeping herself up"  Discharge Plan:   Does patient have access to  transportation?: Yes Will patient be returning to same living situation after discharge?: Yes Currently receiving community mental health services: No If no, would patient like referral for services when discharged?: No Does patient have financial barriers related to discharge medications?: No  Summary/Recommendations:   Summary and Recommendations (to be completed by the evaluator): Pt is 56 year old female from [place]. ([place]).  Pt admitted with diagnosis of major depressive disorder with psychotic features.  Pt has complicated medical history from 2015 heart surgery.  Recommendations for pt include therapeutic milieu, crisis stabilization, attend and particiapte in groups, medication management, and development of comprehensive mental wellness plan.  Upon discharge, pt will most likely be referred to oupt provider.  Kirsten Castro. 10/06/2016

## 2016-10-06 NOTE — BHH Suicide Risk Assessment (Signed)
Kirsten Castro INPATIENT:  Family/Significant Other Suicide Prevention Education  Suicide Prevention Education:  Education Completed; Kirsten Castro, husband.  [phone] has been identified by the patient as the family member/significant other with whom the patient will be residing, and identified as the person(s) who will aid the patient in the event of a mental health crisis (suicidal ideations/suicide attempt).  With written consent from the patient, the family member/significant other has been provided the following suicide prevention education, prior to the and/or following the discharge of the patient.  The suicide prevention education provided includes the following:  Suicide risk factors  Suicide prevention and interventions  National Suicide Hotline telephone number  [facility] assessment telephone number  [facility] Emergency Assistance [place] and/or Residential Mobile Crisis Unit telephone number  Request made of family/significant other to:  Remove weapons (e.g., guns, rifles, knives), all items previously/currently identified as safety concern.    Remove drugs/medications (over-the-counter, prescriptions, illicit drugs), all items previously/currently identified as a safety concern.  The family member/significant other verbalizes understanding of the suicide prevention education information provided.  The family member/significant other agrees to remove the items of safety concern listed above.  Husband provided the following information during the conversation: Pt has a history of 2 TIA, most recently in 2008.  Pt also had heart bypass surgery in 05/2014.  After surgery, pt developed a serious infection that was not detected until later leading to a long term hospitalization (4 months) and the eventual removal of pt sternum.  Pt has no prior mental health history and has only been taking xanax since that time.  Husband reports pt confusion has been  going on for several months but much worse in the 2 weeks prior to admission.  Pt would eat breakfast and 15 minutes later ask if it was dinner time.  Husband would give her medication and 15 minutes later she would forget and ask for them again.  Husband had become afraid to leave pt alone when he went to work.  Husband believes all of this is some sort of flashback to situations that took place during her 4 hospitalization--references to body parts are due to the sternum removal, talk about water due to past issues with bladder retention.    Husband reports no guns in the home.  Joanne Chars, LCSW 10/06/2016, 11:28 AM

## 2016-10-06 NOTE — BHH Group Notes (Signed)
[place] LCSW Group Therapy   10/06/2016  1 PM  Type of Therapy: Group Therapy   Participation Level: Did Not Attend. Patient invited to participate but declined.    Alphonse Guild. Aj Crunkleton, MSW, LCSWA, LCAS

## 2016-10-06 NOTE — Progress Notes (Signed)
Went to patient room.  Informed patient that it is time for her medications.  Patient states "I can't take any medications"  Informed patient that she needs to take her medications and if not we will have to do injections.   Informed Dr. Bary Leriche.  Dr. Bary Leriche down to room and informed patient that she needed to take her medications.  Again patient states "I can't take any medications.  Dr. Bary Leriche informed patient that she needed to take her medications and informed her that we would do injections.  Patient continued to state that she would not take medications and Dr. Bary Leriche stated that she would and that they will be injections and that she would write an order for a hold if necessary.

## 2016-10-06 NOTE — Plan of Care (Signed)
Problem: Nutrition: Goal: Adequate nutrition will be maintained Outcome: Not Progressing Pt has refused to eat or drink this shift despite multiple attempts and encouragements.

## 2016-10-06 NOTE — Progress Notes (Signed)
Recreation Therapy Notes  Date: 01.05.17 Time: 9:30 am Location: Craft Room  Group Topic: Coping Skills  Goal Area(s) Addresses:  Patient will participate in healthy coping skill. Patient will verbalize benefit of using art as a coping skill.  Behavioral Response: Did not attend  Intervention: Coloring  Activity: Patients were given coloring sheets to color and were instructed to think about what emotions they were feeling and what their minds were focused on.  Education: LRT educated patients on healthy coping skills.  Education Outcome: Patient did not attend group.  Clinical Observations/Feedback: Patient did not attend group.  Leonette Monarch, LRT/CTRS 10/06/2016 10:23 AM

## 2016-10-06 NOTE — Progress Notes (Signed)
D:  Asked patient to come take her medications.  Patient started grunting and sat down in the floor.

## 2016-10-07 MED ORDER — HYDROXYZINE HCL 25 MG PO TABS
25.0000 mg | ORAL_TABLET | Freq: Four times a day (QID) | ORAL | Status: DC | PRN
Start: 2016-10-07 — End: 2016-10-12
  Administered 2016-10-09: 25 mg via ORAL
  Filled 2016-10-07: qty 1

## 2016-10-07 NOTE — Progress Notes (Signed)
D: Pt denies SI/HI/AVH. Pt is verbally aggressive, hostile  and un cooperative with treatment plan. Pt appears anxious and restless. Patient's thoughts are disorganized, and she is not  nteracting with peers and staff appropriately.  A: Pt was offered support and encouragement. Pt was given offered  Medications. Q 15 minute checks were done for safety.  R:Pt did not attend group. Pt is refused evening  medication. Pt  Is not receptive to treatment. Safety maintained on unit.

## 2016-10-07 NOTE — Progress Notes (Signed)
D: Patient is alert and disoriented on the unit this shift. Patient not attended and actively participated in groups today. Patient denies suicidal ideation, homicidal ideation, observed to have auditory or visual hallucinations at the present time.  A: Scheduled medications are administered to patient as per MD orders. Emotional support and encouragement are provided. Patient is maintained on q.15 minute safety checks. Patient is informed to notify staff with questions or concerns. R: No adverse medication reactions are noted. Patient is not cooperative with medication administration and treatment plan today. Patient is non receptive,  Agitated and combative on the unit at this time. Patient does not interact  with others on the unit this shift. Patient contracts for safety at this time. Patient remains safe at this time. Anxiety and depression unable to assess due to patients agitated state .She refused p o medications requiring forced medications per MD order. Patient pushed Clinical biochemist and verbally abusive to staff in room .Security times 2 in room for forced medications. Able to give IM but patient combative.

## 2016-10-07 NOTE — [facility] Group Notes (Signed)
Jan 6th 2018 100 PM  Type of Therapy:  Group Therapy   Participation Level:  Did not attend Group today- met with psychiatrist  Treana Lacour LCSW

## 2016-10-07 NOTE — Progress Notes (Signed)
[facility] MD Progress Note  10/07/2016 2:04 PM Kirsten Castro  MRN:  [id number]  Subjective:   Kirsten Castro has a history of depression, possibly bipolar disorder. She is floridly psychotic and refused medications twice in the past 24 hours. FORCED MEDICATION order since 10/04/2016.  Pt seen, chart reviewed, discussed with nursing staff. Pt continue to refuse med, but on FMP. Pt still psychotic, delusional, paranoid as before. Pt states she is anxious, and nothing will help, states she hears voices of  " different things" , would not elaborate. Denies SI/HI.   Principal Problem: Severe recurrent major depression with psychotic features [facility]) Diagnosis:   Patient Active Problem List   Diagnosis Date Noted  . Severe recurrent major depression with psychotic features ([place]) [F33.3] 10/02/2016  . Benzodiazepine withdrawal ([place]) [F13.239] 10/02/2016  . 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 Abdominal pain [R10.9] 04/19/2016  . Sepsis ([place]) [A41.9] 03/09/2016  . Urinary retention [R33.9] 03/09/2016  . CAD (coronary artery disease) [I25.10] 03/09/2016  . Frank hematuria [R31.0] 03/08/2016  . Temporary cerebral vascular dysfunction [G93.9] 02/03/2016  . Peripheral vascular disease ([place]) [I73.9] 02/03/2016  . BP (high blood pressure) [I10] 02/03/2016  . HLD (hyperlipidemia) [E78.5] 02/03/2016  . Clinical depression [F32.9] 02/03/2016  . Carotid artery narrowing [I65.29] 02/03/2016  . 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 Malignant neoplastic disease ([place]) [C80.1] 02/03/2016  . Anxiety [F41.9] 02/03/2016  . Abdominal pain, generalized [R10.84]   . Nausea with vomiting [R11.2]   . Gastritis [K29.70]   . Esophageal candidiasis ([place]) [B37.81]   . Noninfectious diarrhea [K52.9]   . Intractable pain [R52] 01/21/2016  . Upper abdominal pain [R10.10]   . Nausea and vomiting in adult [R11.2]   . Ischemic colitis ([place]) [K55.9] 01/01/2016  . Colitis [K52.9]  12/15/2015  . GI bleed [K92.2] 12/15/2015  . Acute inflammation of the pancreas [K85.90] 12/06/2015  . Vitamin B12 deficiency anemia [D51.9] 02/25/2015  . Absolute anemia [D64.9] 02/25/2015  . Carotid stenosis [I65.29] 02/10/2015  . Personal history of other specified conditions [Z87.898] 11/27/2014  . Osteomyelitis ([place]) [M86.9] 11/27/2014  . H/O coronary artery bypass surgery [Z95.1] 05/18/2014   Total Time spent with patient: 20 minutes  Past Psychiatric History: depression.  Past Medical History:  Past Medical History:  Diagnosis Date  . Anemia   . Anginal pain ([place])   . Anxiety   . 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  This was done at [facility]  . BP (high blood pressure) 02/03/2016  . Cancer ([place])    melanoma skin cancer  . Carotid stenosis 02/10/2015  . Chronic kidney disease    UTI  . Colitis 12/15/2015  . Collagen vascular disease ([place])   . COPD (chronic obstructive pulmonary disease) ([place])   . Coronary artery disease   . Depression   . Esophageal candidiasis ([place])   . Gastritis   . GERD (gastroesophageal reflux disease)   . GI bleed 12/15/2015  . Headache   . Hypertension   . Ischemic colitis ([place]) 01/01/2016   Overview:   SMA mesenteric ischemia  now status post angioplasty by vascular of In stent restenosis.   Overview:  S?P SMA angioplasty   . Myocardial infarction   . Nausea and vomiting in adult   . Noninfectious diarrhea   . Peripheral vascular disease ([place]) 02/03/2016   Overview:  Follows with Dr. Delana Meyer    . Shortness of breath dyspnea    with exertion  . Stroke [facility])     TIA X 2  . Temporary cerebral vascular dysfunction 02/03/2016    Past Surgical History:  Procedure Laterality Date  . ABDOMINAL HYSTERECTOMY    . BACK SURGERY    . BREAST BIOPSY Left 6 2017   results not in yet  . COLONOSCOPY WITH PROPOFOL N/A 01/24/2016   Procedure: COLONOSCOPY WITH PROPOFOL;  Surgeon: Lucilla Lame, MD;  Location: [facility] ENDOSCOPY;  Service: Endoscopy;  Laterality: N/A;  . CORONARY ANGIOPLASTY    . CORONARY ARTERY BYPASS GRAFT    . ESOPHAGOGASTRODUODENOSCOPY (EGD) WITH PROPOFOL N/A 01/24/2016   Procedure: ESOPHAGOGASTRODUODENOSCOPY (EGD) WITH PROPOFOL;  Surgeon: Lucilla Lame, MD;  Location: [facility] ENDOSCOPY;  Service: Endoscopy;  Laterality: N/A;  . KYPHOPLASTY N/A 02/08/2016   Procedure: KYPHOPLASTY L1;  Surgeon: Hessie Knows, MD;  Location: [facility] ORS;  Service: Orthopedics;  Laterality: N/A;  . KYPHOPLASTY N/A 03/28/2016   Procedure: KYPHOPLASTY;  Surgeon: Hessie Knows, MD;  Location: [facility] ORS;  Service: Orthopedics;  Laterality: N/A;  . NOSE SURGERY     cancer removal  . OTHER SURGICAL HISTORY  Jul 11 2014   sternum removal  . PERIPHERAL VASCULAR CATHETERIZATION N/A 02/10/2015   Procedure: Carotid PTA/Stent Intervention;  Surgeon: Katha Cabal, MD;  Location: [place] CV LAB;  Service: Cardiovascular;  Laterality: N/A;  . PERIPHERAL VASCULAR CATHETERIZATION N/A 12/17/2015   Procedure: Visceral Angiography;  Surgeon: Katha Cabal, MD;  Location: [place] CV LAB;  Service: Cardiovascular;  Laterality: N/A;  . PERIPHERAL VASCULAR CATHETERIZATION N/A 12/17/2015   Procedure: Visceral Artery Intervention;  Surgeon: Katha Cabal, MD;  Location: [place] CV LAB;  Service: Cardiovascular;  Laterality: N/A;  . STENT PLACEMENT VASCULAR ([place] HX)     Family History:  Family History  Problem Relation Age of Onset  . Breast cancer Sister   . CAD Father   . CAD Brother    Family Psychiatric  History: see H&P. Social History:  History  Alcohol Use No      History  Drug Use No    Social History   Social History  . Marital status: Married    Spouse name: N/A  . Number of children: N/A  . Years of education: N/A   Social History Main Topics  . Smoking status: Former Smoker    Packs/day: 1.00    Types: Cigarettes    Quit date: 08/01/2012  . Smokeless tobacco: Never Used  . Alcohol use No  . Drug use: No  . Sexual activity: Yes   Other Topics Concern  . None   Social History Narrative  . None   Additional Social History:                         Sleep: Fair  Appetite:  Poor  Current Medications: Current Facility-Administered Medications  Medication Dose Route Frequency Provider Last Rate Last Dose  . acetaminophen (TYLENOL) tablet 650 mg  650 mg Oral Q6H PRN Gonzella Lex, MD      . alum & mag hydroxide-simeth (MAALOX/MYLANTA) 200-200-20 MG/5ML suspension 30 mL  30  mL Oral Q4H PRN Gonzella Lex, MD      . amLODipine (NORVASC) tablet 10 mg  10 mg Oral Daily [name], MD      . atorvastatin (LIPITOR) tablet 80 mg  80 mg Oral q1800 Gonzella Lex, MD   80 mg at 10/04/16 2100  . clopidogrel (PLAVIX) tablet 75 mg  75 mg Oral Daily Gonzella Lex, MD   75 mg at 10/04/16 [id number]  . cyanocobalamin ((VITAMIN B-12)) injection 1,000 mcg  1,000 mcg Intramuscular Daily [name], MD      . diphenhydrAMINE (BENADRYL) capsule 50 mg  50 mg Oral Q6H PRN [name], MD       Or  . diphenhydrAMINE (BENADRYL) injection 50 mg  50 mg Intramuscular Q6H PRN [name], MD   50 mg at 10/05/16 0850  . diphenhydrAMINE (BENADRYL) capsule 50 mg  50 mg Oral Daily [name], MD       Or  . diphenhydrAMINE (BENADRYL) injection 50 mg  50 mg Intramuscular Daily [name], MD   50 mg at 10/07/16 0952  . divalproex (DEPAKOTE) DR tablet 500 mg  500 mg Oral Q8H [name], MD      . fluvoxaMINE (LUVOX) tablet 100 mg  100 mg Oral QHS [name], MD   100 mg at  10/04/16 2214  . furosemide (LASIX) tablet 20 mg  20 mg Oral Daily [name], MD   20 mg at 10/04/16 0839  . haloperidol lactate (HALDOL) injection 10 mg  10 mg Intramuscular Daily [name], MD   10 mg at 10/07/16 [id number]   Or  . haloperidol (HALDOL) tablet 10 mg  10 mg Oral Daily [name], MD      . haloperidol (HALDOL) tablet 5 mg  5 mg Oral Q6H PRN [name], MD       Or  . haloperidol lactate (HALDOL) injection 5 mg  5 mg Intramuscular Q6H PRN Clovis Fredrickson, MD   5 mg at 10/05/16 0850  . LORazepam (ATIVAN) tablet 2 mg  2 mg Oral Q6H PRN Clovis Fredrickson, MD       Or  . LORazepam (ATIVAN) injection 2 mg  2 mg Intramuscular Q6H PRN Clovis Fredrickson, MD   2 mg at 10/05/16 0850  . magnesium hydroxide (MILK OF MAGNESIA) suspension 30 mL  30 mL Oral Daily PRN Gonzella Lex, MD      . mometasone-formoterol [facility]) 200-5 MCG/ACT inhaler 2 puff  2 puff Inhalation BID Gonzella Lex, MD   2 puff at 10/04/16 2100  . nitroGLYCERIN (NITROSTAT) SL tablet 0.4 mg  0.4 mg Sublingual Q5 min PRN Gonzella Lex, MD      . OLANZapine zydis (ZYPREXA) disintegrating tablet 15 mg  15 mg Oral BID AC & HS [name], MD      . pantoprazole (PROTONIX) EC tablet 40 mg  40 mg Oral Daily Gonzella Lex, MD   40 mg at 10/04/16 [id number]  . potassium chloride SA (K-DUR,KLOR-CON) CR tablet 10 mEq  10 mEq Oral Daily Clovis Fredrickson, MD   10 mEq at 10/04/16 0838  . tamsulosin (FLOMAX) capsule 0.4 mg  0.4 mg Oral Daily [name], MD   0.4 mg at 10/04/16 0838  . traZODone (DESYREL) tablet 50 mg  50 mg Oral QHS Gonzella Lex, MD   50 mg at 10/04/16 2214  Lab Results:  No results found for this or any previous visit (from the past 48 hour(s)).  Blood Alcohol level:  Lab Results  Component Value Date   ETH <5 10/02/2016   ETH <5 123456    Metabolic Disorder Labs: Lab Results  Component Value Date   HGBA1C 6.8 (H) 04/19/2016   No  results found for: PROLACTIN Lab Results  Component Value Date   CHOL 134 10/03/2016   TRIG 88 10/03/2016   HDL 53 10/03/2016   CHOLHDL 2.5 10/03/2016   VLDL 18 10/03/2016   LDLCALC 63 10/03/2016    Physical Findings: AIMS:  , ,  ,  ,    CIWA:    COWS:     Musculoskeletal: Strength & Muscle Tone: within normal limits Gait & Station: normal Patient leans: N/A  Psychiatric Specialty Exam: Physical Exam  Nursing note and vitals reviewed.   Review of Systems  Psychiatric/Behavioral: Positive for hallucinations and suicidal ideas. The patient is nervous/anxious.   All other systems reviewed and are negative.   Blood pressure (!) 126/94, pulse 97, temperature 98.9 F (37.2 C), temperature source Oral, resp. rate 20, height 5\' 1"  (1.549 m), weight 71.2 kg (157 lb), SpO2 99 %.Body mass index is 29.66 kg/m.  General Appearance: Disheveled  Eye Contact:  Good  Speech:  Pressured  Volume:  Increased  Mood:  Anxious  Affect:  Congruent, anxious  Thought Process:  Disorganized and Descriptions of Associations: Loose  Orientation:  Full (Time, Place, and Person)  Thought Content:  Illogical, Delusions, Paranoid Ideation and Rumination  Suicidal Thoughts:  denies  Homicidal Thoughts:  No  Memory:  Immediate;   Poor Recent;   Poor Remote;   Poor  Judgement:  Poor  Insight:  Lacking  Psychomotor Activity:  Increased  Concentration:  Concentration: Poor  Recall:  Poor  Fund of Knowledge:  Fair  Language:  Fair  Akathisia:  No  Handed:  Right  AIMS (if indicated):     Assets:  Communication Skills Desire for Improvement Financial Resources/Insurance Housing Resilience Social Support  ADL's:  Intact  Cognition:  WNL  Sleep:  Number of Hours: 6.15     Treatment Plan Summary: Daily contact with patient to assess and evaluate symptoms and progress in treatment and Medication management   Ms. Childress is a 56 year old female with history of depression admitted for  worsening of her symptoms, paranoia and suicidal threats in the context of treatment noncompliance.  Agitation. Haldol, Ativan and Benadryl are available per forced medication order.  1. Suicidal ideation. The patient is able to contract for safety in the hospital.  2. Mood, anxiety and psychosis. We started Zyprexa for psychosis, Depakote for mood stabilization and Luvox for depression, anxiety, and trichotillomania. She refused medications twice in the past 24 hours. We will start Haldol injections. Vistaril for anxiety  3. Hypertension. She is on lisinopril, furosemide, and potassium. She refused all medications in the past 24 hours. Blood pressure is only slightly elevated.  4. Coronary artery disease. She is on Lipitor and nitroglycerin. We will order EKG when the patient is calmer.  5. GERD. She is on Protonix.  6. COPD. She is on [place].  7. Urinary retention. She is on Flomax.  8. Insomnia. Trazodone is available.  9. Metabolic syndrome monitoring. Lipid panel and TSH are normal. Hemoglobin A1c pending.   10. B12 deficiency. B12 level is low. We tried B12 injections but the patient refuses.  11. Disposition. She will be discharged to  home with her husband. She will follow up with mental health professionals.  Lenward Chancellor, MD 10/07/2016, 2:04 PMPatient ID: Kirsten Castro, female   DOB: May 11, 1961, 56 y.o.   MRN: [id number]

## 2016-10-07 NOTE — Progress Notes (Signed)
Patient paranoid and delusional.  Refuses to eat.  Refuses any medications by mouth. Meds given IM.  Patient is labile.  No interaction with peers of staff.

## 2016-10-07 NOTE — Plan of Care (Signed)
Problem: Coping: Goal: Ability to verbalize feelings will improve Outcome: Not Progressing Patient too agitated to verbalize feelings on this shift [place] RN

## 2016-10-07 NOTE — Progress Notes (Signed)
Patient sleeping without distress CTownsend RN

## 2016-10-08 NOTE — Progress Notes (Signed)
[facility] MD Progress Note  10/08/2016 1:48 PM Kirsten Castro  MRN:  [id number]  Subjective:   Kirsten Castro has a history of depression, possibly bipolar disorder. She is floridly psychotic and refused medications twice in the past 24 hours. FORCED MEDICATION order since 10/04/2016.  Pt seen, chart reviewed, discussed with nursing staff. Pt is reluctant to take med, but taking, had breakfast in am. States mood is " so so ", no ne w complaints. on FMP. Pt still psychotic, delusional, paranoid as before. Pt states she is anxious, and nothing will help, states she hears voices as before. Denies SI/HI.   Principal Problem: Severe recurrent major depression with psychotic features [facility]) Diagnosis:   Patient Active Problem List   Diagnosis Date Noted  . Severe recurrent major depression with psychotic features ([place]) [F33.3] 10/02/2016  . Benzodiazepine withdrawal ([place]) [F13.239] 10/02/2016  . 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 Abdominal pain [R10.9] 04/19/2016  . Sepsis ([place]) [A41.9] 03/09/2016  . Urinary retention [R33.9] 03/09/2016  . CAD (coronary artery disease) [I25.10] 03/09/2016  . Frank hematuria [R31.0] 03/08/2016  . Temporary cerebral vascular dysfunction [G93.9] 02/03/2016  . Peripheral vascular disease ([place]) [I73.9] 02/03/2016  . BP (high blood pressure) [I10] 02/03/2016  . HLD (hyperlipidemia) [E78.5] 02/03/2016  . Clinical depression [F32.9] 02/03/2016  . Carotid artery narrowing [I65.29] 02/03/2016  . 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 Malignant neoplastic disease ([place]) [C80.1] 02/03/2016  . Anxiety [F41.9] 02/03/2016  . Abdominal pain, generalized [R10.84]   . Nausea with vomiting [R11.2]   . Gastritis [K29.70]   . Esophageal candidiasis ([place]) [B37.81]   . Noninfectious diarrhea [K52.9]   . Intractable pain [R52] 01/21/2016  . Upper abdominal pain [R10.10]   . Nausea and vomiting in adult [R11.2]   . Ischemic colitis ([place])  [K55.9] 01/01/2016  . Colitis [K52.9] 12/15/2015  . GI bleed [K92.2] 12/15/2015  . Acute inflammation of the pancreas [K85.90] 12/06/2015  . Vitamin B12 deficiency anemia [D51.9] 02/25/2015  . Absolute anemia [D64.9] 02/25/2015  . Carotid stenosis [I65.29] 02/10/2015  . Personal history of other specified conditions [Z87.898] 11/27/2014  . Osteomyelitis ([place]) [M86.9] 11/27/2014  . H/O coronary artery bypass surgery [Z95.1] 05/18/2014   Total Time spent with patient: 20 minutes  Past Psychiatric History: depression.  Past Medical History:  Past Medical History:  Diagnosis Date  . Anemia   . Anginal pain ([place])   . Anxiety   . 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  This was done at [facility]  . BP (high blood pressure) 02/03/2016  . Cancer ([place])    melanoma skin cancer  . Carotid stenosis 02/10/2015  . Chronic kidney disease    UTI  . Colitis 12/15/2015  . Collagen vascular disease ([place])   . COPD (chronic obstructive pulmonary disease) ([place])   . Coronary artery disease   . Depression   . Esophageal candidiasis ([place])   . Gastritis   . GERD (gastroesophageal reflux disease)   . GI bleed 12/15/2015  . Headache   . Hypertension   . Ischemic colitis (  [place]) 01/01/2016   Overview:   SMA mesenteric ischemia now status post angioplasty by vascular of In stent restenosis.   Overview:  S?P SMA angioplasty   . Myocardial infarction   . Nausea and vomiting in adult   . Noninfectious diarrhea   . Peripheral vascular disease ([place]) 02/03/2016   Overview:  Follows with Dr. Delana Meyer    . Shortness of breath  dyspnea    with exertion  . Stroke [facility])    TIA X 2  . Temporary cerebral vascular dysfunction 02/03/2016    Past Surgical History:  Procedure Laterality Date  . ABDOMINAL HYSTERECTOMY    . BACK SURGERY    . BREAST BIOPSY Left 6 2017   results not in yet  . COLONOSCOPY WITH PROPOFOL N/A 01/24/2016   Procedure: COLONOSCOPY WITH PROPOFOL;  Surgeon: Lucilla Lame, MD;  Location: [facility] ENDOSCOPY;  Service: Endoscopy;  Laterality: N/A;  . CORONARY ANGIOPLASTY    . CORONARY ARTERY BYPASS GRAFT    . ESOPHAGOGASTRODUODENOSCOPY (EGD) WITH PROPOFOL N/A 01/24/2016   Procedure: ESOPHAGOGASTRODUODENOSCOPY (EGD) WITH PROPOFOL;  Surgeon: Lucilla Lame, MD;  Location: [facility] ENDOSCOPY;  Service: Endoscopy;  Laterality: N/A;  . KYPHOPLASTY N/A 02/08/2016   Procedure: KYPHOPLASTY L1;  Surgeon: Hessie Knows, MD;  Location: [facility] ORS;  Service: Orthopedics;  Laterality: N/A;  . KYPHOPLASTY N/A 03/28/2016   Procedure: KYPHOPLASTY;  Surgeon: Hessie Knows, MD;  Location: [facility] ORS;  Service: Orthopedics;  Laterality: N/A;  . NOSE SURGERY     cancer removal  . OTHER SURGICAL HISTORY  Jul 11 2014   sternum removal  . PERIPHERAL VASCULAR CATHETERIZATION N/A 02/10/2015   Procedure: Carotid PTA/Stent Intervention;  Surgeon: Katha Cabal, MD;  Location: [place] CV LAB;  Service: Cardiovascular;  Laterality: N/A;  . PERIPHERAL VASCULAR CATHETERIZATION N/A 12/17/2015   Procedure: Visceral Angiography;  Surgeon: Katha Cabal, MD;  Location: [place] CV LAB;  Service: Cardiovascular;  Laterality: N/A;  . PERIPHERAL VASCULAR CATHETERIZATION N/A 12/17/2015   Procedure: Visceral Artery Intervention;  Surgeon: Katha Cabal, MD;  Location: [place] CV LAB;  Service: Cardiovascular;  Laterality: N/A;  . STENT PLACEMENT VASCULAR ([place] HX)     Family History:  Family History  Problem Relation Age of Onset  . Breast cancer Sister   . CAD Father   . CAD Brother    Family Psychiatric  History: see  H&P. Social History:  History  Alcohol Use No     History  Drug Use No    Social History   Social History  . Marital status: Married    Spouse name: N/A  . Number of children: N/A  . Years of education: N/A   Social History Main Topics  . Smoking status: Former Smoker    Packs/day: 1.00    Types: Cigarettes    Quit date: 08/01/2012  . Smokeless tobacco: Never Used  . Alcohol use No  . Drug use: No  . Sexual activity: Yes   Other Topics Concern  . None   Social History Narrative  . None   Additional Social History:                         Sleep: Fair  Appetite:  Poor  Current Medications: Current Facility-Administered Medications  Medication Dose Route Frequency Provider Last Rate Last Dose  . acetaminophen (TYLENOL) tablet 650 mg  650 mg Oral Q6H PRN Gonzella Lex, MD      . alum &  mag hydroxide-simeth (MAALOX/MYLANTA) 200-200-20 MG/5ML suspension 30 mL  30 mL Oral Q4H PRN Gonzella Lex, MD      . amLODipine (NORVASC) tablet 10 mg  10 mg Oral Daily [name], MD   10 mg at 10/08/16 0831  . atorvastatin (LIPITOR) tablet 80 mg  80 mg Oral q1800 Gonzella Lex, MD   80 mg at 10/04/16 2100  . clopidogrel (PLAVIX) tablet 75 mg  75 mg Oral Daily Gonzella Lex, MD   75 mg at 10/08/16 0831  . cyanocobalamin ((VITAMIN B-12)) injection 1,000 mcg  1,000 mcg Intramuscular Daily [name], MD      . diphenhydrAMINE (BENADRYL) capsule 50 mg  50 mg Oral Q6H PRN [name], MD       Or  . diphenhydrAMINE (BENADRYL) injection 50 mg  50 mg Intramuscular Q6H PRN [name], MD   50 mg at 10/05/16 0850  . diphenhydrAMINE (BENADRYL) capsule 50 mg  50 mg Oral Daily [name], MD   50 mg at 10/08/16 0831   Or  . diphenhydrAMINE (BENADRYL) injection 50 mg  50 mg Intramuscular Daily [name], MD   50 mg at 10/07/16 2152  . divalproex (DEPAKOTE) DR tablet 500 mg  500 mg Oral Q8H [name], MD      .  fluvoxaMINE (LUVOX) tablet 100 mg  100 mg Oral QHS [name], MD   100 mg at 10/04/16 2214  . furosemide (LASIX) tablet 20 mg  20 mg Oral Daily [name], MD   20 mg at 10/08/16 0831  . haloperidol lactate (HALDOL) injection 10 mg  10 mg Intramuscular Daily [name], MD   10 mg at 10/07/16 [id number]   Or  . haloperidol (HALDOL) tablet 10 mg  10 mg Oral Daily [name], MD   10 mg at 10/08/16 0831  . haloperidol (HALDOL) tablet 5 mg  5 mg Oral Q6H PRN [name], MD       Or  . haloperidol lactate (HALDOL) injection 5 mg  5 mg Intramuscular Q6H PRN [name], MD   5 mg at 10/07/16 2216  . hydrOXYzine (ATARAX/VISTARIL) tablet 25 mg  25 mg Oral Q6H PRN Lenward Chancellor, MD      . LORazepam (ATIVAN) tablet 2 mg  2 mg Oral Q6H PRN Clovis Fredrickson, MD   2 mg at 10/08/16 [id number]   Or  . LORazepam (ATIVAN) injection 2 mg  2 mg Intramuscular Q6H PRN Clovis Fredrickson, MD   2 mg at 10/07/16 2216  . magnesium hydroxide (MILK OF MAGNESIA) suspension 30 mL  30 mL Oral Daily PRN Gonzella Lex, MD      . mometasone-formoterol [facility]) 200-5 MCG/ACT inhaler 2 puff  2 puff Inhalation BID Gonzella Lex, MD   2 puff at 10/04/16 2100  . nitroGLYCERIN (NITROSTAT) SL tablet 0.4 mg  0.4 mg Sublingual Q5 min PRN Gonzella Lex, MD      . OLANZapine zydis (ZYPREXA) disintegrating tablet 15 mg  15 mg Oral BID AC & HS [name], MD   15 mg at 10/08/16 0831  . pantoprazole (PROTONIX) EC tablet 40 mg  40 mg Oral Daily Gonzella Lex, MD   40 mg at 10/08/16 [id number]  . potassium chloride SA (K-DUR,KLOR-CON) CR tablet 10 mEq  10 mEq Oral Daily Clovis Fredrickson, MD   10 mEq at 10/08/16 0832  . tamsulosin (  FLOMAX) capsule 0.4 mg  0.4 mg Oral Daily [name], MD   0.4 mg at 10/08/16 0831  . traZODone (DESYREL) tablet 50 mg  50 mg Oral QHS Gonzella Lex, MD   50 mg at 10/04/16 2214    Lab Results:  No results found for this or any  previous visit (from the past 35 hour(s)).  Blood Alcohol level:  Lab Results  Component Value Date   ETH <5 10/02/2016   ETH <5 [id number]    Metabolic Disorder Labs: Lab Results  Component Value Date   HGBA1C 6.8 (H) 04/19/2016   No results found for: PROLACTIN Lab Results  Component Value Date   CHOL 134 10/03/2016   TRIG 88 10/03/2016   HDL 53 10/03/2016   CHOLHDL 2.5 10/03/2016   VLDL 18 10/03/2016   LDLCALC 63 10/03/2016    Physical Findings: AIMS:  , ,  ,  ,    CIWA:    COWS:     Musculoskeletal: Strength & Muscle Tone: within normal limits Gait & Station: normal Patient leans: N/A  Psychiatric Specialty Exam: Physical Exam  Nursing note and vitals reviewed.   Review of Systems  Psychiatric/Behavioral: Positive for hallucinations and suicidal ideas. The patient is nervous/anxious.   All other systems reviewed and are negative.   Blood pressure (!) 176/98, pulse (!) 108, temperature 98.7 F (37.1 C), temperature source Oral, resp. rate 18, height 5\' 1"  (1.549 m), weight 71.2 kg (157 lb), SpO2 99 %.Body mass index is 29.66 kg/m.  General Appearance: Disheveled  Eye Contact:  Good  Speech:  Pressured  Volume:  Increased  Mood:  Anxious  Affect:  Congruent, anxious  Thought Process:  Disorganized and Descriptions of Associations: Loose  Orientation:  Full (Time, Place, and Person)  Thought Content:  Illogical, Delusions, Paranoid Ideation and Rumination  Suicidal Thoughts:  denies  Homicidal Thoughts:  No  Memory:  Immediate;   Poor Recent;   Poor Remote;   Poor  Judgement:  Poor  Insight:  Lacking  Psychomotor Activity:  Increased  Concentration:  Concentration: Poor  Recall:  Poor  Fund of Knowledge:  Fair  Language:  Fair  Akathisia:  No  Handed:  Right  AIMS (if indicated):     Assets:  Communication Skills Desire for Improvement Financial Resources/Insurance Housing Resilience Social Support  ADL's:  Intact  Cognition:  WNL  Sleep:   Number of Hours: 7.45     Treatment Plan Summary: Daily contact with patient to assess and evaluate symptoms and progress in treatment and Medication management   Ms. Antill is a 56 year old female with history of depression admitted for worsening of her symptoms, paranoia and suicidal threats in the context of treatment noncompliance.  Agitation. Haldol, Ativan and Benadryl are available per forced medication order.  1. Suicidal ideation. The patient is able to contract for safety in the hospital.  2. Mood, anxiety and psychosis. We started Zyprexa for psychosis, Depakote for mood stabilization and Luvox for depression, anxiety, and trichotillomania. She refused medications twice in the past 24 hours. We will start Haldol injections. Vistaril for anxiety  3. Hypertension. She is on lisinopril, furosemide, and potassium. She refused all medications in the past 24 hours. Blood pressure is only slightly elevated.  4. Coronary artery disease. She is on Lipitor and nitroglycerin. We will order EKG when the patient is calmer.  5. GERD. She is on Protonix.  6. COPD. She is on [place].  7. Urinary retention. She is  on Flomax.  8. Insomnia. Trazodone is available.  9. Metabolic syndrome monitoring. Lipid panel and TSH are normal. Hemoglobin A1c pending.   10. B12 deficiency. B12 level is low. We tried B12 injections but the patient refuses.  11. Disposition. She will be discharged to home with her husband. She will follow up with mental health professionals.  Lenward Chancellor, MD 10/08/2016, 1:48 PMPatient ID: Kirsten Castro, female   DOB: [birth year], 55 y.o.   MRN: [id number] Patient ID: Kirsten Castro, female   DOB: [birth year], 56 y.o.   MRN: [id number]

## 2016-10-08 NOTE — Plan of Care (Signed)
Problem: Nutrition: Goal: Adequate nutrition will be maintained Outcome: Progressing Pt ate breakfast and lunch today with encouragement, 70% as noted by MHT.

## 2016-10-08 NOTE — Progress Notes (Signed)
Pt much more appropriate on the unit today. Alert, oriented. Denies SI/HI/AVH. Medication compliant with encouragement. When initially approached, pt refuses medication, but with assistance to sit up in bed and drink/med provided, pt does swallow down her pills. Continues to isolate to room except during meal times, no interaction noted with peers/staff. Does not attend groups. Pt did eat lunch, but complains that "im not able to swallow my food." Pt does appear to swallow without difficulty. MHTs noted pt ate 70% of lunch. Pt sleeps in her bed most of the morning into the afternoon. No verbal aggression noted, pt cooperative, continues to be disorganized, anxious, restless.   Support and encourage provided with use of therapeutic communication. Medications administered as ordered with education. Safety maintained with every 15 minute checks. Will continue to monitor.

## 2016-10-08 NOTE — Progress Notes (Signed)
On approach this morning, pt asked nurse about breakfast. Breakfast was delivered soon after and pt did go to dayroom and eat breakfast. Pt did take medications by mouth as ordered with some encouragement from nurse and Animal nutritionist. Soon after she swallowed pills with water, pt stated "i'm probably going to throw up." Safety maintained. Will continue to monitor.

## 2016-10-08 NOTE — BHH Group Notes (Signed)
[place] Group Notes:  (Nursing/MHT/Case Management/Adjunct)  Date:  10/08/2016  Time:  4:59 AM  Type of Therapy:  Psychoeducational Skills  Participation Level:  Did Not Attend  Summary of Progress/Problems:  Reece Agar 10/08/2016, 4:59 AM

## 2016-10-09 NOTE — BHH Group Notes (Signed)
[place] Group Notes:  (Nursing/MHT/Case Management/Adjunct)  Date:  10/09/2016  Time:  9:35 PM  Type of Therapy:  Psychoeducational Skills  Participation Level:  Did Not Attend  Participation Quality: Summary of Progress/Problems:  Nehemiah Settle 10/09/2016, 9:35 PM

## 2016-10-09 NOTE — Progress Notes (Signed)
D:  Per pt self inventory pt reports sleeping poor, appetite poor, energy level low, ability to pay attention fair, rates depression at a 3 out of 10, hopelessness at a 0 out of 10, anxiety at a 0 out of 10, denies SI/HI/AVH, goal today: "get out of the hospital but I am not going to be able to do that the way y'all treat people",  Pt refused to talk further about this, guarded, irritable during interaction.    A:  Emotional support provided, Encouraged pt to continue with treatment plan and attend all group activities, q15 min checks maintained for safety.  R:  Pt is going to some groups, irritable and guarded with staff and other patients on the unit, pt took her am medications today but it took a lot of encouragement, she was resistant at first but them took all her meds PO, only med she refused was the [facility] inhaler, MD notified.

## 2016-10-09 NOTE — Progress Notes (Signed)
[facility] Second Physician Opinion Progress Note for Medication Administration to Non-consenting Patients (For Involuntarily Committed Patients)  Patient: Kirsten Castro Date of Birth: [id number] MRN: [id number]  Reason for the Medication: The patient, without the benefit of the specific treatment measure, is incapable of participating in any available treatment plan that will give the patient a realistic opportunity of improving the patient's condition. There is, without the benefit of the specific treatment measure, a significant possibility that the patient will harm self or others before improvement of the patient's condition is realized.  Consideration of Side Effects: Consideration of the side effects related to the medication plan has been given.  Rationale for Medication Administration: still psychotic.  No much improvement     Hildred Priest, MD 10/09/16  1:09 PM   This documentation is good for (7) seven days from the date of the MD signature. New documentation must be completed every seven (7) days with detailed justification in the medical record if the patient requires continued non-emergent administration of psychotropic medications.

## 2016-10-09 NOTE — Progress Notes (Signed)
Recreation Therapy Notes  Date: 01.08.18 Time: 9:30 am Location: Craft Room  Group Topic: Self-expression  Goal Area(s) Addresses:  Patient will effectively use as a means of self-expression. Patient will recognize positive benefit of self-expression. Patient will be able to identify one emotion experienced during group session. Patient will identify use of art as a coping skill.  Behavioral Response: Did not attend  Intervention: Two Faces of Me  Activity: Patients were given a blank face worksheet and were instructed to draw a line down the middle. On one side of the worksheet, patients were instructed to draw or write how they felt when they were admitted to the hospital. On the other side, patients were instructed to draw or write how they want to feel when they are d/c.  Education: LRT educated patients on other forms of self-expression.  Education Outcome: Patient did not attend group.   Clinical Observations/Feedback: Patient did not attend group.  Leonette Monarch, LRT/CTRS 10/09/2016 10:19 AM

## 2016-10-09 NOTE — Progress Notes (Signed)
D: Pt denies SI/HI/AVH. Pt isolates to room and continues to refuse medications. Pt did agree to take her Zyprexa 15 mg and 2 mg Ativan with much coaxing and visual support from Engineer, structural.   A: Pt was offered support and encouragement. Pt was given scheduled medications. Pt was encourage to attend groups. Q 15 minute checks were done for safety.   R:  safety maintained on unit.

## 2016-10-09 NOTE — BHH Group Notes (Signed)
[place] LCSW Group Therapy   10/09/2016  1 PM  Type of Therapy: Group Therapy   Participation Level: Did Not Attend. Patient invited to participate but declined.    Alphonse Guild. Camdyn Beske, MSW, LCSWA, LCAS

## 2016-10-09 NOTE — Progress Notes (Addendum)
[facility] MD Progress Note  10/09/2016 10:01 AM Kirsten Castro  MRN:  [id number]  Subjective:   Kirsten Castro has a history of depression, possibly bipolar disorder. She is floridly psychotic and refused medications twice in the past 24 hours. FORCED MEDICATION order since 10/04/2016.  10/08/2016 Pt seen, chart reviewed, discussed with nursing staff. Pt is reluctant to take med, but taking, had breakfast in am. States mood is " so so ", no ne w complaints. on FMP. Pt still psychotic, delusional, paranoid as before. Pt states she is anxious, and nothing will help, states she hears voices as before. Denies SI/HI.   10/09/2016. Kirsten Castro remains uncooperative and rude. She is on forced medication order. She composes taking any medications by mouth or by injection with an argument that she has not been eating any food here, therefore she should not be given any medications. She still wants me to check her body for "fluids". SW spoke with the husband who believes that all her problems started following bypass surgery with complications leading to confusion and behavioral changes. We will continue forced medication orders as the patient is still psychotic and unable to make educated decision about her treatment.  Per nursing; D: Pt denies SI/HI/AVH. Pt isolates to room and continues to refuse medications. Pt did agree to take her Zyprexa 15 mg and 2 mg Ativan with much coaxing and visual support from Engineer, structural.  A: Pt was offered support and encouragement. Pt was given scheduled medications. Pt was encourage to attend groups. Q 15 minute checks were done for safety.  R:  safety maintained on unit.  Principal Problem: Severe recurrent major depression with psychotic features [facility]) Diagnosis:   Patient Active Problem List   Diagnosis Date Noted  . Severe recurrent major depression with psychotic features ([place]) [F33.3] 10/02/2016  . Benzodiazepine withdrawal ([place]) [F13.239] 10/02/2016  . Trichotillomania  [F63.3] 10/02/2016  . Abdominal pain [R10.9] 04/19/2016  . Sepsis ([place]) [A41.9] 03/09/2016  . Urinary retention [R33.9] 03/09/2016  . CAD (coronary artery disease) [I25.10] 03/09/2016  . Frank hematuria [R31.0] 03/08/2016  . Temporary cerebral vascular dysfunction [G93.9] 02/03/2016  . Peripheral vascular disease ([place]) [I73.9] 02/03/2016  . BP (high blood pressure) [I10] 02/03/2016  . HLD (hyperlipidemia) [E78.5] 02/03/2016  . Clinical depression [F32.9] 02/03/2016  . Carotid artery narrowing [I65.29] 02/03/2016  . 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 Malignant neoplastic disease ([place]) [C80.1] 02/03/2016  . Anxiety [F41.9] 02/03/2016  . Abdominal pain, generalized [R10.84]   . Nausea with vomiting [R11.2]   . Gastritis [K29.70]   . Esophageal candidiasis ([place]) [B37.81]   . Noninfectious diarrhea [K52.9]   . Intractable pain [R52] 01/21/2016  . Upper abdominal pain [R10.10]   . Nausea and vomiting in adult [R11.2]   . Ischemic colitis ([place]) [K55.9] 01/01/2016  . Colitis [K52.9] 12/15/2015  . GI bleed [K92.2] 12/15/2015  . Acute inflammation of the pancreas [K85.90] 12/06/2015  . Vitamin B12 deficiency anemia [D51.9] 02/25/2015  . Absolute anemia [D64.9] 02/25/2015  . Carotid stenosis [I65.29] 02/10/2015  . Personal history of other specified conditions [Z87.898] 11/27/2014  . Osteomyelitis ([place]) [M86.9] 11/27/2014  . H/O coronary artery bypass surgery [Z95.1] 05/18/2014   Total Time spent with patient: 20 minutes  Past Psychiatric History: depression.  Past Medical History:  Past Medical History:  Diagnosis Date  . Anemia   . Anginal pain ([place])   . Anxiety   . 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  This was done at [facility]  . BP (high blood pressure) 02/03/2016  . Cancer ([place])    melanoma skin cancer  . Carotid stenosis 02/10/2015  . Chronic kidney disease    UTI  . Colitis 12/15/2015  . Collagen vascular disease ([place])   . COPD (chronic obstructive pulmonary disease) ([place])   . Coronary artery disease   . Depression   . Esophageal candidiasis ([place])   . Gastritis   . GERD (gastroesophageal reflux disease)   . GI bleed 12/15/2015  . Headache   . Hypertension   . Ischemic colitis ([place]) 01/01/2016   Overview:   SMA mesenteric ischemia now status post angioplasty by vascular of In stent restenosis.   Overview:  S?P SMA angioplasty   . Myocardial infarction   . Nausea and vomiting in adult   . Noninfectious diarrhea   . Peripheral vascular disease ([place]) 02/03/2016   Overview:  Follows with Dr. Delana Meyer    . Shortness of breath dyspnea    with exertion  . Stroke [facility])    TIA X 2  . Temporary cerebral vascular dysfunction 02/03/2016    Past Surgical History:  Procedure Laterality Date  . ABDOMINAL HYSTERECTOMY    . BACK SURGERY    . BREAST BIOPSY Left 6 2017   results not in yet  . COLONOSCOPY WITH PROPOFOL N/A 01/24/2016   Procedure: COLONOSCOPY WITH PROPOFOL;  Surgeon: Lucilla Lame, MD;  Location: [facility] ENDOSCOPY;  Service: Endoscopy;  Laterality: N/A;  . CORONARY ANGIOPLASTY    . CORONARY ARTERY BYPASS GRAFT    . ESOPHAGOGASTRODUODENOSCOPY (EGD) WITH PROPOFOL N/A 01/24/2016   Procedure: ESOPHAGOGASTRODUODENOSCOPY (EGD) WITH PROPOFOL;  Surgeon: Lucilla Lame, MD;  Location: [facility] ENDOSCOPY;  Service: Endoscopy;  Laterality: N/A;  . KYPHOPLASTY N/A 02/08/2016   Procedure: KYPHOPLASTY L1;  Surgeon: Hessie Knows, MD;  Location: [facility] ORS;  Service: Orthopedics;  Laterality: N/A;  . KYPHOPLASTY N/A 03/28/2016   Procedure: KYPHOPLASTY;   Surgeon: Hessie Knows, MD;  Location: [facility] ORS;  Service: Orthopedics;  Laterality: N/A;  . NOSE SURGERY     cancer removal  . OTHER SURGICAL HISTORY  Jul 11 2014   sternum removal  . PERIPHERAL VASCULAR CATHETERIZATION N/A 02/10/2015   Procedure: Carotid PTA/Stent Intervention;  Surgeon: Katha Cabal, MD;  Location: [place] CV LAB;  Service: Cardiovascular;  Laterality: N/A;  . PERIPHERAL VASCULAR CATHETERIZATION N/A 12/17/2015   Procedure: Visceral Angiography;  Surgeon: Katha Cabal, MD;  Location: [place] CV LAB;  Service: Cardiovascular;  Laterality: N/A;  . PERIPHERAL VASCULAR CATHETERIZATION N/A 12/17/2015   Procedure: Visceral Artery Intervention;  Surgeon: Katha Cabal, MD;  Location: [place] CV LAB;  Service: Cardiovascular;  Laterality: N/A;  . STENT PLACEMENT VASCULAR ([place] HX)     Family History:  Family History  Problem Relation Age of Onset  . Breast cancer Sister   . CAD Father   . CAD Brother    Family Psychiatric  History: see H&P. Social History:  History  Alcohol Use No     History  Drug Use No  Social History   Social History  . Marital status: Married    Spouse name: N/A  . Number of children: N/A  . Years of education: N/A   Social History Main Topics  . Smoking status: Former Smoker    Packs/day: 1.00    Types: Cigarettes    Quit date: 08/01/2012  . Smokeless tobacco: Never Used  . Alcohol use No  . Drug use: No  . Sexual activity: Yes   Other Topics Concern  . None   Social History Narrative  . None   Additional Social History:                         Sleep: Fair  Appetite:  Poor  Current Medications: Current Facility-Administered Medications  Medication Dose Route Frequency Provider Last Rate Last Dose  . acetaminophen (TYLENOL) tablet 650 mg  650 mg Oral Q6H PRN Gonzella Lex, MD   650 mg at 10/09/16 0836  . alum & mag hydroxide-simeth (MAALOX/MYLANTA) 200-200-20 MG/5ML suspension 30  mL  30 mL Oral Q4H PRN Gonzella Lex, MD      . amLODipine (NORVASC) tablet 10 mg  10 mg Oral Daily Clovis Fredrickson, MD   10 mg at 10/09/16 0828  . atorvastatin (LIPITOR) tablet 80 mg  80 mg Oral q1800 Gonzella Lex, MD   80 mg at 10/04/16 2100  . clopidogrel (PLAVIX) tablet 75 mg  75 mg Oral Daily Gonzella Lex, MD   75 mg at 10/09/16 0829  . cyanocobalamin ((VITAMIN B-12)) injection 1,000 mcg  1,000 mcg Intramuscular Daily Clovis Fredrickson, MD   1,000 mcg at 10/09/16 0829  . diphenhydrAMINE (BENADRYL) capsule 50 mg  50 mg Oral Q6H PRN Chyane Greer B Solomon Skowronek, MD       Or  . diphenhydrAMINE (BENADRYL) injection 50 mg  50 mg Intramuscular Q6H PRN Raun Routh B Deatrice Spanbauer, MD   50 mg at 10/05/16 0850  . diphenhydrAMINE (BENADRYL) capsule 50 mg  50 mg Oral Daily Tannen Vandezande B Sharren Schnurr, MD   50 mg at 10/09/16 0828   Or  . diphenhydrAMINE (BENADRYL) injection 50 mg  50 mg Intramuscular Daily Ivyrose Hashman B Ezmeralda Stefanick, MD   50 mg at 10/07/16 2152  . divalproex (DEPAKOTE) DR tablet 500 mg  500 mg Oral Q8H Derrel Moore B Ying Blankenhorn, MD      . fluvoxaMINE (LUVOX) tablet 100 mg  100 mg Oral QHS Janaysha Depaulo B Islah Eve, MD   100 mg at 10/04/16 2214  . furosemide (LASIX) tablet 20 mg  20 mg Oral Daily Majour Frei B Icelynn Onken, MD   20 mg at 10/09/16 [id number]  . haloperidol lactate (HALDOL) injection 10 mg  10 mg Intramuscular Daily Hedy Garro B Mckenna Boruff, MD   10 mg at 10/07/16 [id number]   Or  . haloperidol (HALDOL) tablet 10 mg  10 mg Oral Daily Tanuj Mullens B Aneesa Romey, MD   10 mg at 10/09/16 0828  . haloperidol (HALDOL) tablet 5 mg  5 mg Oral Q6H PRN Chalet Kerwin B Leianne Callins, MD       Or  . haloperidol lactate (HALDOL) injection 5 mg  5 mg Intramuscular Q6H PRN Yailene Badia B Lillyana Majette, MD   5 mg at 10/07/16 2216  . hydrOXYzine (ATARAX/VISTARIL) tablet 25 mg  25 mg Oral Q6H PRN Lenward Chancellor, MD      . LORazepam (ATIVAN) tablet 2 mg  2 mg Oral Q6H PRN Clovis Fredrickson, MD   2 mg at 10/08/16 2306   Or  .  LORazepam (ATIVAN)  injection 2 mg  2 mg Intramuscular Q6H PRN Clovis Fredrickson, MD   2 mg at 10/07/16 2216  . magnesium hydroxide (MILK OF MAGNESIA) suspension 30 mL  30 mL Oral Daily PRN Gonzella Lex, MD      . mometasone-formoterol [facility]) 200-5 MCG/ACT inhaler 2 puff  2 puff Inhalation BID Gonzella Lex, MD   2 puff at 10/04/16 2100  . nitroGLYCERIN (NITROSTAT) SL tablet 0.4 mg  0.4 mg Sublingual Q5 min PRN Gonzella Lex, MD      . OLANZapine zydis (ZYPREXA) disintegrating tablet 15 mg  15 mg Oral BID AC & HS Tinamarie Przybylski B Ryszard Socarras, MD   15 mg at 10/09/16 0828  . pantoprazole (PROTONIX) EC tablet 40 mg  40 mg Oral Daily Gonzella Lex, MD   40 mg at 10/09/16 0828  . potassium chloride SA (K-DUR,KLOR-CON) CR tablet 10 mEq  10 mEq Oral Daily Clovis Fredrickson, MD   10 mEq at 10/09/16 0828  . tamsulosin (FLOMAX) capsule 0.4 mg  0.4 mg Oral Daily Alexei Doswell B Kalani Sthilaire, MD   0.4 mg at 10/09/16 0828  . traZODone (DESYREL) tablet 50 mg  50 mg Oral QHS Gonzella Lex, MD   50 mg at 10/04/16 2214    Lab Results:  No results found for this or any previous visit (from the past 59 hour(s)).  Blood Alcohol level:  Lab Results  Component Value Date   ETH <5 10/02/2016   ETH <5 123456    Metabolic Disorder Labs: Lab Results  Component Value Date   HGBA1C 6.8 (H) 04/19/2016   No results found for: PROLACTIN Lab Results  Component Value Date   CHOL 134 10/03/2016   TRIG 88 10/03/2016   HDL 53 10/03/2016   CHOLHDL 2.5 10/03/2016   VLDL 18 10/03/2016   LDLCALC 63 10/03/2016    Physical Findings: AIMS:  , ,  ,  ,    CIWA:    COWS:     Musculoskeletal: Strength & Muscle Tone: within normal limits Gait & Station: normal Patient leans: N/A  Psychiatric Specialty Exam: Physical Exam  Nursing note and vitals reviewed.   Review of Systems  Psychiatric/Behavioral: Positive for hallucinations and suicidal ideas. The patient is nervous/anxious.   All other systems reviewed and are negative.    Blood pressure 121/74, pulse (!) 109, temperature 97.8 F (36.6 C), temperature source Oral, resp. rate 20, height 5\' 1"  (1.549 m), weight 71.2 kg (157 lb), SpO2 99 %.Body mass index is 29.66 kg/m.  General Appearance: Disheveled  Eye Contact:  Good  Speech:  Pressured  Volume:  Increased  Mood:  Anxious  Affect:  Congruent, anxious  Thought Process:  Disorganized and Descriptions of Associations: Loose  Orientation:  Full (Time, Place, and Person)  Thought Content:  Illogical, Delusions, Paranoid Ideation and Rumination  Suicidal Thoughts:  denies  Homicidal Thoughts:  No  Memory:  Immediate;   Poor Recent;   Poor Remote;   Poor  Judgement:  Poor  Insight:  Lacking  Psychomotor Activity:  Increased  Concentration:  Concentration: Poor  Recall:  Poor  Fund of Knowledge:  Fair  Language:  Fair  Akathisia:  No  Handed:  Right  AIMS (if indicated):     Assets:  Communication Skills Desire for Improvement Financial Resources/Insurance Housing Resilience Social Support  ADL's:  Intact  Cognition:  WNL  Sleep:  Number of Hours: 7     Treatment Plan Summary: Daily  contact with patient to assess and evaluate symptoms and progress in treatment and Medication management   Kirsten. Saragoza is a 56 year old female with history of depression admitted for worsening of her symptoms, paranoia and suicidal threats in the context of treatment noncompliance.  *   Agitation. Haldol, Ativan and Benadryl are available per forced medication order.  1. Suicidal ideation. The patient is able to contract for safety in the hospital.  2. Mood, anxiety and psychosis. We started Zyprexa and added Haldol for psychosis, Depakote for mood stabilization and Luvox for depression, anxiety, and trichotillomania. She is on forced medications now and refuses oral  meds.    3. Hypertension. She is on lisinopril, furosemide, and potassium. Blood pressure is normal.   4. Coronary artery disease. She is  on Lipitor and nitroglycerin. We will order EKG when the patient is calmer.  5. GERD. She is on Protonix.  6. COPD. She is on [place].  7. Urinary retention. She is on Flomax.  8. Insomnia. Trazodone is available.  9. Metabolic syndrome monitoring. Lipid panel and TSH are normal. Hemoglobin A1c pending.   10. B12 deficiency. B12 level is low. We tried B12 injections but the patient refuses.  11. Disposition. She will be discharged to home with her husband. She will follow up with mental health professionals.  Orson Slick, MD 10/09/2016, 10:01 AM   Patient ID: Kirsten Castro, female   DOB: 1961/08/06, 56 y.o.   MRN: [id number] Patient ID: Kirsten Castro, female   DOB: Jul 12, 1961, 56 y.o.   MRN: [id number]

## 2016-10-10 LAB — AMMONIA: AMMONIA: 14 umol/L (ref 9–35)

## 2016-10-10 LAB — VALPROIC ACID LEVEL: Valproic Acid Lvl: 20 ug/mL — ABNORMAL LOW (ref 50.0–100.0)

## 2016-10-10 MED ORDER — OLANZAPINE 10 MG IM SOLR
15.0000 mg | Freq: Two times a day (BID) | INTRAMUSCULAR | Status: DC | PRN
Start: 2016-10-10 — End: 2016-10-19
  Administered 2016-10-10: 15 mg via INTRAMUSCULAR
  Filled 2016-10-10: qty 20

## 2016-10-10 NOTE — [facility] Group Notes (Signed)
[facility] LCSW Group Therapy   10/10/2016  1 PM  Type of Therapy: Group Therapy   Participation Level: Did Not Attend. Patient invited to participate but declined.    Alphonse Guild. Myiah Petkus, MSW, LCSWA, LCAS

## 2016-10-10 NOTE — Tx Team (Signed)
Interdisciplinary Treatment and Diagnostic Plan Update  10/10/2016 Time of Session: [place] MRN: [id number]  Principal Diagnosis: Severe recurrent major depression with psychotic features [facility])  Secondary Diagnoses: Principal Problem:   Severe recurrent major depression with psychotic features ([place]) Active Problems:   CAD (coronary artery disease)   Benzodiazepine withdrawal ([place])   Trichotillomania   Current Medications:  Current Facility-Administered Medications  Medication Dose Route Frequency Provider Last Rate Last Dose  . acetaminophen (TYLENOL) tablet 650 mg  650 mg Oral Q6H PRN Gonzella Lex, MD   650 mg at 10/09/16 0836  . alum & mag hydroxide-simeth (MAALOX/MYLANTA) 200-200-20 MG/5ML suspension 30 mL  30 mL Oral Q4H PRN Gonzella Lex, MD      . amLODipine (NORVASC) tablet 10 mg  10 mg Oral Daily Clovis Fredrickson, MD   10 mg at 10/10/16 0835  . atorvastatin (LIPITOR) tablet 80 mg  80 mg Oral q1800 Gonzella Lex, MD   80 mg at 10/04/16 2100  . clopidogrel (PLAVIX) tablet 75 mg  75 mg Oral Daily Gonzella Lex, MD   75 mg at 10/10/16 0834  . cyanocobalamin ((VITAMIN B-12)) injection 1,000 mcg  1,000 mcg Intramuscular Daily Clovis Fredrickson, MD   1,000 mcg at 10/09/16 0829  . diphenhydrAMINE (BENADRYL) capsule 50 mg  50 mg Oral Q6H PRN [name], MD       Or  . diphenhydrAMINE (BENADRYL) injection 50 mg  50 mg Intramuscular Q6H PRN [name], MD   50 mg at 10/05/16 0850  . diphenhydrAMINE (BENADRYL) capsule 50 mg  50 mg Oral Daily [name], MD   50 mg at 10/10/16 0834   Or  . diphenhydrAMINE (BENADRYL) injection 50 mg  50 mg Intramuscular Daily [name], MD   50 mg at 10/07/16 2152  . divalproex (DEPAKOTE) DR tablet 500 mg  500 mg Oral Q8H [name], MD   500 mg at 10/09/16 1526  . fluvoxaMINE (LUVOX) tablet 100 mg  100 mg Oral QHS [name], MD   100 mg at 10/04/16 2214  . furosemide  (LASIX) tablet 20 mg  20 mg Oral Daily [name], MD   20 mg at 10/10/16 0835  . haloperidol lactate (HALDOL) injection 10 mg  10 mg Intramuscular Daily [name], MD   10 mg at 10/07/16 0953   Or  . haloperidol (HALDOL) tablet 10 mg  10 mg Oral Daily [name], MD   10 mg at 10/10/16 0834  . haloperidol (HALDOL) tablet 5 mg  5 mg Oral Q6H PRN [name], MD       Or  . haloperidol lactate (HALDOL) injection 5 mg  5 mg Intramuscular Q6H PRN [name], MD   5 mg at 10/07/16 2216  . hydrOXYzine (ATARAX/VISTARIL) tablet 25 mg  25 mg Oral Q6H PRN Lenward Chancellor, MD   25 mg at 10/09/16 1535  . LORazepam (ATIVAN) tablet 2 mg  2 mg Oral Q6H PRN Clovis Fredrickson, MD   2 mg at 10/08/16 2306   Or  . LORazepam (ATIVAN) injection 2 mg  2 mg Intramuscular Q6H PRN Clovis Fredrickson, MD   2 mg at 10/07/16 2216  . magnesium hydroxide (MILK OF MAGNESIA) suspension 30 mL  30 mL Oral Daily PRN Gonzella Lex, MD      . mometasone-formoterol (DULERA) 200-5 MCG/ACT inhaler 2 puff  2 puff Inhalation BID Jenny Reichmann  Salley Scarlet, MD   2 puff at 10/04/16 2100  . nitroGLYCERIN (NITROSTAT) SL tablet 0.4 mg  0.4 mg Sublingual Q5 min PRN Gonzella Lex, MD      . OLANZapine zydis (ZYPREXA) disintegrating tablet 15 mg  15 mg Oral BID AC & HS [name], MD   15 mg at 10/10/16 0835  . pantoprazole (PROTONIX) EC tablet 40 mg  40 mg Oral Daily Gonzella Lex, MD   40 mg at 10/10/16 0834  . potassium chloride SA (K-DUR,KLOR-CON) CR tablet 10 mEq  10 mEq Oral Daily Clovis Fredrickson, MD   10 mEq at 10/10/16 0835  . tamsulosin (FLOMAX) capsule 0.4 mg  0.4 mg Oral Daily [name], MD   0.4 mg at 10/10/16 0835  . traZODone (DESYREL) tablet 50 mg  50 mg Oral QHS Gonzella Lex, MD   50 mg at 10/04/16 2214   PTA Medications: Facility-Administered Medications Prior to Admission  Medication Dose Route Frequency Provider Last Rate Last Dose  . lidocaine  (XYLOCAINE) 2 % jelly 1 application  1 application Urethral Once Nickie Retort, MD       Prescriptions Prior to Admission  Medication Sig Dispense Refill Last Dose  . acetaminophen (TYLENOL) 500 MG tablet Take 1,000 mg by mouth every 6 (six) hours as needed for mild pain or headache.   Taking  . ALPRAZolam (XANAX) 1 MG tablet Take 1 mg by mouth 3 (three) times daily as needed for anxiety or sleep.    Taking  . atorvastatin (LIPITOR) 80 MG tablet Take 80 mg by mouth daily.   Taking  . cetirizine (ZYRTEC) 10 MG tablet Take 10 mg by mouth daily. Reported on 02/08/2016   Not Taking  . clopidogrel (PLAVIX) 75 MG tablet Take 75 mg by mouth daily.    Taking  . clotrimazole (LOTRIMIN) 1 % cream APP EXT AA BID   Not Taking  . clotrimazole-betamethasone (LOTRISONE) cream Apply topically.   Not Taking  . cyclobenzaprine (FLEXERIL) 10 MG tablet Take 10 mg by mouth 3 (three) times daily as needed for muscle spasms.    Taking  . Fluticasone-Salmeterol (ADVAIR DISKUS) 250-50 MCG/DOSE AEPB Inhale 1 puff into the lungs 2 (two) times daily.    Taking  . furosemide (LASIX) 20 MG tablet Take 20 mg by mouth.   Taking  . hydrocortisone (ANUSOL-HC) 2.5 % rectal cream Apply topically.   Not Taking  . lisinopril (ZESTRIL) 10 MG tablet Take 1 tablet (10 mg total) by mouth daily. 60 tablet 1 Taking  . Magnesium 250 MG TABS Take 250 mg by mouth 2 (two) times daily.   Taking  . ondansetron (ZOFRAN) 4 MG tablet Take 1 tablet (4 mg total) by mouth daily as needed for nausea or vomiting. 20 tablet 1   . ondansetron (ZOFRAN-ODT) 4 MG disintegrating tablet Take 4 mg by mouth every 8 (eight) hours as needed.    Taking  . pantoprazole (PROTONIX) 40 MG tablet Take 1 tablet (40 mg total) by mouth 2 (two) times daily. 60 tablet 1 Taking  . phenazopyridine (PYRIDIUM) 100 MG tablet    Not Taking  . polyethylene glycol powder (GLYCOLAX/MIRALAX) powder    Not Taking  . potassium chloride (MICRO-K) 10 MEQ CR capsule Take 10 mEq by  mouth 2 (two) times daily.    Taking  . senna-docusate (SENOKOT-S) 8.6-50 MG tablet Take 1 tablet by mouth at bedtime as needed for mild constipation.   Taking  .  tamsulosin (FLOMAX) 0.4 MG CAPS capsule TAKE ONE CAPSULE BY MOUTH DAILY WITH BREAKFAST   Not Taking  . traZODone (DESYREL) 50 MG tablet Take 50 mg by mouth at bedtime as needed.   Taking    Patient Stressors: Health problems Medication change or noncompliance  Patient Strengths: Ability for insight Capable of independent living Supportive family/friends  Treatment Modalities: Medication Management, Group therapy, Case management,  1 to 1 session with clinician, Psychoeducation, Recreational therapy.   Physician Treatment Plan for Primary Diagnosis: Severe recurrent major depression with psychotic features ([place]) Long Term Goal(s): Improvement in symptoms so as ready for discharge NA   Short Term Goals: Ability to identify changes in lifestyle to reduce recurrence of condition will improve Ability to verbalize feelings will improve Ability to disclose and discuss suicidal ideas Ability to demonstrate self-control will improve Ability to identify and develop effective coping behaviors will improve Ability to maintain clinical measurements within normal limits will improve Compliance with prescribed medications will improve Ability to identify triggers associated with substance abuse/mental health issues will improve NA  Medication Management: Evaluate patient's response, side effects, and tolerance of medication regimen.  Therapeutic Interventions: 1 to 1 sessions, Unit Group sessions and Medication administration.  Evaluation of Outcomes: Progressing  Physician Treatment Plan for Secondary Diagnosis: Principal Problem:   Severe recurrent major depression with psychotic features ([place]) Active Problems:   CAD (coronary artery disease)   Benzodiazepine withdrawal ([place])   Trichotillomania  Long Term Goal(s): Improvement  in symptoms so as ready for discharge NA   Short Term Goals: Ability to identify changes in lifestyle to reduce recurrence of condition will improve Ability to verbalize feelings will improve Ability to disclose and discuss suicidal ideas Ability to demonstrate self-control will improve Ability to identify and develop effective coping behaviors will improve Ability to maintain clinical measurements within normal limits will improve Compliance with prescribed medications will improve Ability to identify triggers associated with substance abuse/mental health issues will improve NA     Medication Management: Evaluate patient's response, side effects, and tolerance of medication regimen.  Therapeutic Interventions: 1 to 1 sessions, Unit Group sessions and Medication administration.  Evaluation of Outcomes: Progressing   RN Treatment Plan for Primary Diagnosis: Severe recurrent major depression with psychotic features ([place]) Long Term Goal(s): Knowledge of disease and therapeutic regimen to maintain health will improve  Short Term Goals: Compliance with prescribed medications will improve  Medication Management: RN will administer medications as ordered by provider, will assess and evaluate patient's response and provide education to patient for prescribed medication. RN will report any adverse and/or side effects to prescribing provider.  Therapeutic Interventions: 1 on 1 counseling sessions, Psychoeducation, Medication administration, Evaluate responses to treatment, Monitor vital signs and CBGs as ordered, Perform/monitor CIWA, COWS, AIMS and Fall Risk screenings as ordered, Perform wound care treatments as ordered.  Evaluation of Outcomes: Progressing   LCSW Treatment Plan for Primary Diagnosis: Severe recurrent major depression with psychotic features ([place]) Long Term Goal(s): Safe transition to appropriate next level of care at discharge, Engage patient in therapeutic group addressing  interpersonal concerns.  Short Term Goals: Engage patient in aftercare planning with referrals and resources and Increase skills for wellness and recovery  Therapeutic Interventions: Assess for all discharge needs, 1 to 1 time with Social worker, Explore available resources and support systems, Assess for adequacy in community support network, Educate family and significant other(s) on suicide prevention, Complete Psychosocial Assessment, Interpersonal group therapy.  Evaluation of Outcomes: Progressing   Progress  in Treatment: Attending groups: No. Participating in groups: No. Taking medication as prescribed: Yes. Toleration medication: Yes. Family/Significant other contact made: Yes, individual(s) contacted:  Octaviano Batty, husband Patient understands diagnosis: Yes. Discussing patient identified problems/goals with staff: Yes. Medical problems stabilized or resolved: Yes. Denies suicidal/homicidal ideation: Yes. Issues/concerns per patient self-inventory: Yes. Other: none  New problem(s) identified: No, Describe:  none  New Short Term/Long Term Goal(s):  Discharge Plan or Barriers:   Reason for Continuation of Hospitalization: Delusions   Estimated Length of Stay: 5-7 days  Attendees: Patient: Flavia Mccarver 10/10/2016   Physician: Dr Bary Leriche, MD 10/10/2016   Nursing: Floyde Parkins, RN 10/10/2016   RN Care Manager: 10/10/2016   Social Worker: Lurline Idol, LCSW 10/10/2016   Recreational Therapist: Delle Reining 10/10/2016   Other:  10/10/2016   Other:  10/10/2016   Other: 10/10/2016        Scribe for Treatment Team: Joanne Chars, [place] 10/10/2016 12:25 PM

## 2016-10-10 NOTE — Progress Notes (Signed)
[facility] MD Progress Note  10/10/2016 2:04 PM Kirsten Castro  MRN:  [id number]  Subjective:   Kirsten Castro has a history of depression, possibly bipolar disorder. She is floridly psychotic and refused medications twice in the past 24 hours. FORCED MEDICATION order since 10/04/2016.  10/08/2016 Pt seen, chart reviewed, discussed with nursing staff. Pt is reluctant to take med, but taking, had breakfast in am. States mood is " so so ", no ne w complaints. on FMP. Pt still psychotic, delusional, paranoid as before. Pt states she is anxious, and nothing will help, states she hears voices as before. Denies SI/HI.   10/09/2016. Kirsten Castro remains uncooperative and rude. She is on forced medication order. She composes taking any medications by mouth or by injection with an argument that she has not been eating any food here, therefore she should not be given any medications. She still wants me to check her body for "fluids". SW spoke with the husband who believes that all her problems started following bypass surgery with complications leading to confusion and behavioral changes. We will continue forced medication orders as the patient is still psychotic and unable to make educated decision about her treatment.  10/10/2016. Kirsten Castro met with the treatment team for the first time today. She is very anxious appearing, making strange noises, unable to sit still, fidgety, and slightly agitated. She however was able to have a brief conversation with her treatment team. She was telling us again that she is unable to take any medications and should not be given any because she is "full of water and doesn't eat anything here". She did take her medications today with encouragement. She does better when we offered her Coca-Cola to drink. She also signed her name on the form indicating that she participated in the meeting with no hesitation. She agreed to draw labs trhis morning. Unfortunately VPA level is only 20 as she  refused most of her doses. The patient is unconvinced that medications are helpful. Moreover she believes that it's "too late" now for treatment. This statement suggests a diagnosis of depression with psychotic features.  Per nursing; D: Pt denies SI/HI/AVH, but noted responding to internal stimuli. Patient's affect is flat and sad, her thoughts are  sometimes disorganized. Patient appears less anxious and she is having minimal  Interaction with peers and staff.  A: Pt was offered support and encouragement. Pt was given scheduled medications. Pt was encouraged to.  attend groups. Q 15 minute checks were done for safety.  R:Pt did not attend group. Pt is not complaint with  medication. Pt has no complaints Pt is not receptive to treatment and safety maintained on unit.  Principal Problem: Severe recurrent major depression with psychotic features [facility]) Diagnosis:   Patient Active Problem List   Diagnosis Date Noted  . Severe recurrent major depression with psychotic features ([place]) [F33.3] 10/02/2016  . Benzodiazepine withdrawal ([place]) [F13.239] 10/02/2016  . 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 Abdominal pain [R10.9] 04/19/2016  . Sepsis ([place]) [A41.9] 03/09/2016  . Urinary retention [R33.9] 03/09/2016  . CAD (coronary artery disease) [I25.10] 03/09/2016  . Frank hematuria [R31.0] 03/08/2016  . Temporary cerebral vascular dysfunction [G93.9] 02/03/2016  . Peripheral vascular disease ([place]) [I73.9] 02/03/2016  . BP (high blood pressure) [I10] 02/03/2016  . HLD (hyperlipidemia) [E78.5] 02/03/2016  . Clinical depression [F32.9] 02/03/2016  . Carotid artery narrowing [I65.29] 02/03/2016  . 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 Malignant neoplastic disease ([place]) [C80.1] 02/03/2016  .  Anxiety [F41.9] 02/03/2016  . Abdominal pain, generalized [R10.84]   . Nausea with vomiting [R11.2]   . Gastritis [K29.70]   . Esophageal candidiasis ([place]) [B37.81]   .  Noninfectious diarrhea [K52.9]   . Intractable pain [R52] 01/21/2016  . Upper abdominal pain [R10.10]   . Nausea and vomiting in adult [R11.2]   . Ischemic colitis ([place]) [K55.9] 01/01/2016  . Colitis [K52.9] 12/15/2015  . GI bleed [K92.2] 12/15/2015  . Acute inflammation of the pancreas [K85.90] 12/06/2015  . Vitamin B12 deficiency anemia [D51.9] 02/25/2015  . Absolute anemia [D64.9] 02/25/2015  . Carotid stenosis [I65.29] 02/10/2015  . Personal history of other specified conditions [Z87.898] 11/27/2014  . Osteomyelitis ([place]) [M86.9] 11/27/2014  . H/O coronary artery bypass surgery [Z95.1] 05/18/2014   Total Time spent with patient: 20 minutes  Past Psychiatric History: depression.  Past Medical History:  Past Medical History:  Diagnosis Date  . Anemia   . Anginal pain ([place])   . Anxiety   . 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  This was done at [facility]  . BP (high blood pressure) 02/03/2016  . Cancer ([place])    melanoma skin cancer  . Carotid stenosis 02/10/2015  . Chronic kidney disease    UTI  . Colitis 12/15/2015  . Collagen vascular disease ([place])   . COPD (chronic obstructive pulmonary disease) ([place])   . Coronary artery disease   . Depression   . Esophageal candidiasis ([place])   . Gastritis   . GERD (gastroesophageal reflux disease)   . GI bleed 12/15/2015  . Headache   . Hypertension   . Ischemic colitis ([place]) 01/01/2016   Overview:   SMA mesenteric ischemia now status post angioplasty by vascular of In stent restenosis.   Overview:  S?P SMA angioplasty   . Myocardial infarction   .  Nausea and vomiting in adult   . Noninfectious diarrhea   . Peripheral vascular disease ([place]) 02/03/2016   Overview:  Follows with Dr. Delana Meyer    . Shortness of breath dyspnea    with exertion  . Stroke [facility])    TIA X 2  . Temporary cerebral vascular dysfunction 02/03/2016    Past Surgical History:  Procedure Laterality Date  . ABDOMINAL HYSTERECTOMY    . BACK SURGERY    . BREAST BIOPSY Left 6 2017   results not in yet  . COLONOSCOPY WITH PROPOFOL N/A 01/24/2016   Procedure: COLONOSCOPY WITH PROPOFOL;  Surgeon: Lucilla Lame, MD;  Location: [facility] ENDOSCOPY;  Service: Endoscopy;  Laterality: N/A;  . CORONARY ANGIOPLASTY    . CORONARY ARTERY BYPASS GRAFT    . ESOPHAGOGASTRODUODENOSCOPY (EGD) WITH PROPOFOL N/A 01/24/2016   Procedure: ESOPHAGOGASTRODUODENOSCOPY (EGD) WITH PROPOFOL;  Surgeon: Lucilla Lame, MD;  Location: [facility] ENDOSCOPY;  Service: Endoscopy;  Laterality: N/A;  . KYPHOPLASTY N/A 02/08/2016   Procedure: KYPHOPLASTY L1;  Surgeon: Hessie Knows, MD;  Location: [facility] ORS;  Service: Orthopedics;  Laterality: N/A;  . KYPHOPLASTY N/A 03/28/2016   Procedure: KYPHOPLASTY;  Surgeon: Hessie Knows, MD;  Location: [facility] ORS;  Service: Orthopedics;  Laterality: N/A;  .  NOSE SURGERY     cancer removal  . OTHER SURGICAL HISTORY  Jul 11 2014   sternum removal  . PERIPHERAL VASCULAR CATHETERIZATION N/A 02/10/2015   Procedure: Carotid PTA/Stent Intervention;  Surgeon: Katha Cabal, MD;  Location: [place] CV LAB;  Service: Cardiovascular;  Laterality: N/A;  . PERIPHERAL VASCULAR CATHETERIZATION N/A 12/17/2015   Procedure: Visceral Angiography;  Surgeon: Katha Cabal, MD;  Location: [place] CV LAB;  Service: Cardiovascular;  Laterality: N/A;  . PERIPHERAL VASCULAR CATHETERIZATION N/A 12/17/2015   Procedure: Visceral Artery Intervention;  Surgeon: Katha Cabal, MD;  Location: [place] CV LAB;  Service: Cardiovascular;  Laterality: N/A;  . STENT PLACEMENT VASCULAR ([place] HX)      Family History:  Family History  Problem Relation Age of Onset  . Breast cancer Sister   . CAD Father   . CAD Brother    Family Psychiatric  History: see H&P. Social History:  History  Alcohol Use No     History  Drug Use No    Social History   Social History  . Marital status: Married    Spouse name: N/A  . Number of children: N/A  . Years of education: N/A   Social History Main Topics  . Smoking status: Former Smoker    Packs/day: 1.00    Types: Cigarettes    Quit date: 08/01/2012  . Smokeless tobacco: Never Used  . Alcohol use No  . Drug use: No  . Sexual activity: Yes   Other Topics Concern  . None   Social History Narrative  . None   Additional Social History:                         Sleep: Fair  Appetite:  Poor  Current Medications: Current Facility-Administered Medications  Medication Dose Route Frequency Provider Last Rate Last Dose  . acetaminophen (TYLENOL) tablet 650 mg  650 mg Oral Q6H PRN Gonzella Lex, MD   650 mg at 10/09/16 0836  . alum & mag hydroxide-simeth (MAALOX/MYLANTA) 200-200-20 MG/5ML suspension 30 mL  30 mL Oral Q4H PRN Gonzella Lex, MD      . amLODipine (NORVASC) tablet 10 mg  10 mg Oral Daily Clovis Fredrickson, MD   10 mg at 10/10/16 0835  . atorvastatin (LIPITOR) tablet 80 mg  80 mg Oral q1800 Gonzella Lex, MD   80 mg at 10/04/16 2100  . clopidogrel (PLAVIX) tablet 75 mg  75 mg Oral Daily Gonzella Lex, MD   75 mg at 10/10/16 0834  . cyanocobalamin ((VITAMIN B-12)) injection 1,000 mcg  1,000 mcg Intramuscular Daily Clovis Fredrickson, MD   1,000 mcg at 10/09/16 0829  . diphenhydrAMINE (BENADRYL) capsule 50 mg  50 mg Oral Q6H PRN Shauntae Reitman B Charee Tumblin, MD       Or  . diphenhydrAMINE (BENADRYL) injection 50 mg  50 mg Intramuscular Q6H PRN Precious Gilchrest B Zacharius Funari, MD   50 mg at 10/05/16 0850  . diphenhydrAMINE (BENADRYL) capsule 50 mg  50 mg Oral Daily Mylia Pondexter B Akito Boomhower, MD   50 mg at 10/10/16 0834   Or  .  diphenhydrAMINE (BENADRYL) injection 50 mg  50 mg Intramuscular Daily Kemper Heupel B Severn Goddard, MD   50 mg at 10/07/16 2152  . divalproex (DEPAKOTE) DR tablet 500 mg  500 mg Oral Q8H Marsalis Beaulieu B Shanna Un, MD   500 mg at 10/09/16 1526  . fluvoxaMINE (LUVOX) tablet 100 mg  100 mg  Oral QHS Clovis Fredrickson, MD   100 mg at 10/04/16 2214  . furosemide (LASIX) tablet 20 mg  20 mg Oral Daily Chelsea Pedretti B Hayde Kilgour, MD   20 mg at 10/10/16 0835  . haloperidol lactate (HALDOL) injection 10 mg  10 mg Intramuscular Daily Stassi Fadely B Kathrynne Kulinski, MD   10 mg at 10/07/16 0953   Or  . haloperidol (HALDOL) tablet 10 mg  10 mg Oral Daily Kenidee Cregan B Jacquetta Polhamus, MD   10 mg at 10/10/16 0834  . haloperidol (HALDOL) tablet 5 mg  5 mg Oral Q6H PRN Holland Kotter B Samaiyah Howes, MD       Or  . haloperidol lactate (HALDOL) injection 5 mg  5 mg Intramuscular Q6H PRN Dornell Grasmick B Larinda Herter, MD   5 mg at 10/07/16 2216  . hydrOXYzine (ATARAX/VISTARIL) tablet 25 mg  25 mg Oral Q6H PRN Lenward Chancellor, MD   25 mg at 10/09/16 1535  . LORazepam (ATIVAN) tablet 2 mg  2 mg Oral Q6H PRN Clovis Fredrickson, MD   2 mg at 10/08/16 2306   Or  . LORazepam (ATIVAN) injection 2 mg  2 mg Intramuscular Q6H PRN Clovis Fredrickson, MD   2 mg at 10/07/16 2216  . magnesium hydroxide (MILK OF MAGNESIA) suspension 30 mL  30 mL Oral Daily PRN Gonzella Lex, MD      . mometasone-formoterol [facility]) 200-5 MCG/ACT inhaler 2 puff  2 puff Inhalation BID Gonzella Lex, MD   2 puff at 10/04/16 2100  . nitroGLYCERIN (NITROSTAT) SL tablet 0.4 mg  0.4 mg Sublingual Q5 min PRN Gonzella Lex, MD      . OLANZapine zydis (ZYPREXA) disintegrating tablet 15 mg  15 mg Oral BID AC & HS Arianne Klinge B Taveon Enyeart, MD   15 mg at 10/10/16 0835  . pantoprazole (PROTONIX) EC tablet 40 mg  40 mg Oral Daily Gonzella Lex, MD   40 mg at 10/10/16 0834  . potassium chloride SA (K-DUR,KLOR-CON) CR tablet 10 mEq  10 mEq Oral Daily Clovis Fredrickson, MD   10 mEq at 10/10/16 0835  .  tamsulosin (FLOMAX) capsule 0.4 mg  0.4 mg Oral Daily Enola Siebers B Pia Jedlicka, MD   0.4 mg at 10/10/16 0835  . traZODone (DESYREL) tablet 50 mg  50 mg Oral QHS Gonzella Lex, MD   50 mg at 10/04/16 2214    Lab Results:  Results for orders placed or performed during the hospital encounter of 10/02/16 (from the past 48 hour(s))  Valproic acid level     Status: Abnormal   Collection Time: 10/10/16  7:39 AM  Result Value Ref Range   Valproic Acid Lvl 20 (L) 50.0 - 100.0 ug/mL  Ammonia     Status: None   Collection Time: 10/10/16  7:39 AM  Result Value Ref Range   Ammonia 14 9 - 35 umol/L    Blood Alcohol level:  Lab Results  Component Value Date   ETH <5 10/02/2016   ETH <5 52/84/1324    Metabolic Disorder Labs: Lab Results  Component Value Date   HGBA1C 6.8 (H) 04/19/2016   No results found for: PROLACTIN Lab Results  Component Value Date   CHOL 134 10/03/2016   TRIG 88 10/03/2016   HDL 53 10/03/2016   CHOLHDL 2.5 10/03/2016   VLDL 18 10/03/2016   LDLCALC 63 10/03/2016    Physical Findings: AIMS:  , ,  ,  ,    CIWA:    COWS:  Musculoskeletal: Strength & Muscle Tone: within normal limits Gait & Station: normal Patient leans: N/A  Psychiatric Specialty Exam: Physical Exam  Nursing note and vitals reviewed.   Review of Systems  Psychiatric/Behavioral: Positive for hallucinations and suicidal ideas. The patient is nervous/anxious.   All other systems reviewed and are negative.   Blood pressure 123/81, pulse (!) 109, temperature 98 F (36.7 C), temperature source Oral, resp. rate 12, height _0  (1.549 m), weight 71.2 kg (157 lb), SpO2 99 %.Body mass index is 29.66 kg/m.  General Appearance: Disheveled  Eye Contact:  Good  Speech:  Pressured  Volume:  Increased  Mood:  Anxious  Affect:  Congruent, anxious  Thought Process:  Disorganized and Descriptions of Associations: Loose  Orientation:  Full (Time, Place, and Person)  Thought Content:  Illogical,  Delusions, Paranoid Ideation and Rumination  Suicidal Thoughts:  denies  Homicidal Thoughts:  No  Memory:  Immediate;   Poor Recent;   Poor Remote;   Poor  Judgement:  Poor  Insight:  Lacking  Psychomotor Activity:  Increased  Concentration:  Concentration: Poor  Recall:  Poor  Fund of Knowledge:  Fair  Language:  Fair  Akathisia:  No  Handed:  Right  AIMS (if indicated):     Assets:  Communication Skills Desire for Improvement Financial Resources/Insurance Housing Resilience Social Support  ADL's:  Intact  Cognition:  WNL  Sleep:  Number of Hours: 8     Treatment Plan Summary: Daily contact with patient to assess and evaluate symptoms and progress in treatment and Medication management   Kirsten Castro is a 56 year old female with history of depression admitted for worsening of her symptoms, paranoia and suicidal threats in the context of treatment noncompliance.  *   Agitation. Haldol, Ativan and Benadryl are available per forced medication order.  1. Suicidal ideation. The patient is able to contract for safety in the hospital.  2. Mood, anxiety and psychosis. We started Zyprexa and added Haldol for psychosis, Depakote for mood stabilization and Luvox for depression, anxiety, and trichotillomania. She is on forced medications now and refuses oral  meds. VPA level 20, ammonia low. Will increase Luvox.  3. Hypertension. She is on lisinopril, furosemide, and potassium. Blood pressure is normal.   4. Coronary artery disease. She is on Lipitor and nitroglycerin. We will order EKG when the patient is calmer.  5. GERD. She is on Protonix.  6. COPD. She is on [place].  7. Urinary retention. She is on Flomax.  8. Insomnia. Trazodone is available.  9. Metabolic syndrome monitoring. Lipid panel and TSH are normal. Hemoglobin A1c pending.   10. B12 deficiency. B12 level is low. We tried B12 injections but the patient refuses.  11. Disposition. She will be  discharged to home with her husband. She will follow up with mental health professionals.  Orson Slick, MD 10/10/2016, 2:04 PM   Patient ID: Kirsten Castro, female   DOB: Sep 22, 1961, 56 y.o.   MRN: [id number] Patient ID: Kirsten Castro, female   DOB: Jul 24, 1961, 56 y.o.   MRN: [id number]

## 2016-10-10 NOTE — Progress Notes (Signed)
Recreation Therapy Notes  Date: 01.09.18 Time: 9:30 am Location: Craft Room  Group Topic: Goal Setting  Goal Area(s) Addresses:  Patient will write at least one goal. Patient will write at least one obstacle.  Behavioral Response: Did not attend  Intervention: Recovery Goal Chart  Activity: Patients were instructed to make a Recovery Goal Chart including goals, obstacles, the date they started working on their goals, and the date they achieved their goals.  Education: LRT educated patients on healthy ways they can celebrate reaching their goals.  Education Outcome: Patient did not attend group.   Clinical Observations/Feedback: Patient did not attend group.  Leonette Monarch, LRT/CTRS 10/10/2016 10:19 AM

## 2016-10-10 NOTE — Progress Notes (Signed)
D: Pt denies SI/HI/AVH, but noted responding to internal stimuli. Patient's affect is flat and sad, her thoughts are  sometimes disorganized. Patient appears less anxious and she is having minimal  Interaction with peers and staff.  A: Pt was offered support and encouragement. Pt was given scheduled medications. Pt was encouraged to.  attend groups. Q 15 minute checks were done for safety.  R:Pt did not attend group. Pt is not complaint with  medication. Pt has no complaints Pt is not receptive to treatment and safety maintained on unit.

## 2016-10-10 NOTE — BHH Group Notes (Signed)
Goals Group  Date/Time: 9:00 AM Type of Therapy and Topic: Group Therapy: Goals Group: SMART Goals  ?  Participation Level: Moderate  ?  Description of Group:  ?  The purpose of a daily goals group is to assist and guide patients in setting recovery/wellness-related goals. The objective is to set goals as they relate to the crisis in which they were admitted. Patients will be using SMART goal modalities to set measurable goals. Characteristics of realistic goals will be discussed and patients will be assisted in setting and processing how one will reach their goal. Facilitator will also assist patients in applying interventions and coping skills learned in psycho-education groups to the SMART goal and process how one will achieve defined goal.  ?  Therapeutic Goals:  ?  -Patients will develop and document one goal related to or their crisis in which brought them into treatment.  -Patients will be guided by LCSW using SMART goal setting modality in how to set a measurable, attainable, realistic and time sensitive goal.  -Patients will process barriers in reaching goal.  -Patients will process interventions in how to overcome and successful in reaching goal.  ?  Patient's Goal: Pt invited but did not attend. ?  Therapeutic Modalities:  Motivational Interviewing  Cognitive Behavioral Therapy  Crisis Intervention Model  SMART goals setting  Glorious Peach, MSW, LCSW-A 10/10/2016, 10:45AM

## 2016-10-10 NOTE — Progress Notes (Signed)
Patient is irritable,anxious and uncooperative today.Patient took her morning meds with lot of encouragement.Refused to eat anything today.States "my body is filled with fluids I cannot take anymore."Patient was rude & does not want to listen to staff.

## 2016-10-11 LAB — HEMOGLOBIN A1C
HEMOGLOBIN A1C: 6.3 % — AB (ref 4.8–5.6)
MEAN PLASMA GLUCOSE: 134 mg/dL

## 2016-10-11 NOTE — BHH Group Notes (Signed)
[place] LCSW Group Therapy   10/11/2016  1:00 pm   Type of Therapy: Group Therapy   Participation Level: Pt invited but did not attend.  Participation Quality: Pt invited but did not attend.  Summary of Progress/Problems: The topic for group today was emotional regulation. This group focused on both positive and negative emotion identification and allowed  group members to process ways to identify feelings, regulate negative emotions, and find healthy ways to manage internal/external emotions. Group members were asked to reflect on a time when their reaction to an emotion led to a negative outcome and explored how alternative responses using emotion regulation would have benefited them. Group members were also asked to discuss a time when emotion regulation was utilized when a negative emotion was experienced.     Glorious Peach, MSW, LCSWA 10/11/2016, 2:21PM

## 2016-10-11 NOTE — Progress Notes (Signed)
Recreation Therapy Notes  Date: 01.10.19 Time: 9:30 am Location: Craft Room  Group Topic: Self-esteem  Goal Area(s) Addresses:  Patient will write at least one positive trait about self. Patient will verbalize benefit of having a healthy self-esteem.  Behavioral Response: Did not attend  Intervention: I Am  Activity: Patients were given a worksheet with the letter I on it and were instructed to write as many positive traits about themselves inside the letter.  Education: LRT educated patients on ways to increase their self-esteem.  Education Outcome: Patient did not attend group.  Clinical Observations/Feedback: Patient did not attend group.  Leonette Monarch, LRT/CTRS 10/11/2016 10:16 AM

## 2016-10-11 NOTE — Progress Notes (Signed)
Patient isolates to her room.  Remains delusional.  States that she is full of water and cannot take her medications because she cannot swallow and that it will choke her. Agreeable to take IM injection of haldol and benadryl.  Appetite improving.   Support and encouragement offered.  Safety maintained.

## 2016-10-11 NOTE — Progress Notes (Signed)
[facility] MD Progress Note  10/11/2016 5:33 PM Kirsten Castro  MRN:  [id number]  Subjective:   Kirsten Castro has a history of depression, possibly bipolar disorder. She is floridly psychotic and refused medications twice in the past 24 hours. FORCED MEDICATION order since 10/04/2016.  10/08/2016 Pt seen, chart reviewed, discussed with nursing staff. Pt is reluctant to take med, but taking, had breakfast in am. States mood is " so so ", no ne w complaints. on FMP. Pt still psychotic, delusional, paranoid as before. Pt states she is anxious, and nothing will help, states she hears voices as before. Denies SI/HI.   10/09/2016. Kirsten Castro remains uncooperative and rude. She is on forced medication order. She composes taking any medications by mouth or by injection with an argument that she has not been eating any food here, therefore she should not be given any medications. She still wants me to check her body for "fluids". SW spoke with the husband who believes that all her problems started following bypass surgery with complications leading to confusion and behavioral changes. We will continue forced medication orders as the patient is still psychotic and unable to make educated decision about her treatment.  10/10/2016. Kirsten Castro met with the treatment team for the first time today. She is very anxious appearing, making strange noises, unable to sit still, fidgety, and slightly agitated. She however was able to have a brief conversation with her treatment team. She was telling us again that she is unable to take any medications and should not be given any because she is "full of water and doesn't eat anything here". She did take her medications today with encouragement. She does better when we offered her Coca-Cola to drink. She also signed her name on the form indicating that she participated in the meeting with no hesitation. She agreed to draw labs trhis morning. Unfortunately VPA level is only 20 as she  refused most of her doses. The patient is unconvinced that medications are helpful. Moreover she believes that it's "too late" now for treatment. This statement suggests a diagnosis of depression with psychotic features.  10/11/2016. Kirsten Castro appears slightly better today. She ate half of her breakfast and lunch. She properly introduced herself to a staff member this morning. On direkt questioning, she tells me that she is feeling "no good". She refused medications even with encouragement and a bribe with Coca cola. She complains of being full of water, unable to drink, or eat, unable to urinate. Indeed, she has a history of urinary retention. Per nursing, she wears a diaper but in spite of her complaints of "water coming from the front and the back", her diaper is frequently dry. She refuses Flomax, even though she knows why this was prescribed. She refuses blood pressure medications but her blood pressure has not been elevated. She adamantly refuses to see a Conservator, museum/gallery. I will offer it to her tomorrow again.  Per nursing; Patient refused her PM medication stating, "I can't swallow.  I don't want water.  I don't want none of it.  It's going to make me choke.  I'm full of fluid.  Look at my stomach."  Patient is a force med order, however no IM injections were found for PM medication.  Writer spoke with Dr Weber Cooks who ordered IM Zyprexa BID PRN if patient refuses PO.  Patient agreed to take injection.    Principal Problem: Severe recurrent major depression with psychotic features [facility]) Diagnosis:   Patient Active Problem  List   Diagnosis Date Noted  . Severe recurrent major depression with psychotic features ([place]) [F33.3] 10/02/2016  . Benzodiazepine withdrawal ([place]) [F13.239] 10/02/2016  . 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 Abdominal pain [R10.9] 04/19/2016  . Sepsis ([place]) [A41.9] 03/09/2016  . Urinary retention [R33.9] 03/09/2016  . CAD (coronary artery disease) [I25.10] 03/09/2016   . Frank hematuria [R31.0] 03/08/2016  . Temporary cerebral vascular dysfunction [G93.9] 02/03/2016  . Peripheral vascular disease ([place]) [I73.9] 02/03/2016  . BP (high blood pressure) [I10] 02/03/2016  . HLD (hyperlipidemia) [E78.5] 02/03/2016  . Clinical depression [F32.9] 02/03/2016  . Carotid artery narrowing [I65.29] 02/03/2016  . 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 Malignant neoplastic disease ([place]) [C80.1] 02/03/2016  . Anxiety [F41.9] 02/03/2016  . Abdominal pain, generalized [R10.84]   . Nausea with vomiting [R11.2]   . Gastritis [K29.70]   . Esophageal candidiasis ([place]) [B37.81]   . Noninfectious diarrhea [K52.9]   . Intractable pain [R52] 01/21/2016  . Upper abdominal pain [R10.10]   . Nausea and vomiting in adult [R11.2]   . Ischemic colitis ([place]) [K55.9] 01/01/2016  . Colitis [K52.9] 12/15/2015  . GI bleed [K92.2] 12/15/2015  . Acute inflammation of the pancreas [K85.90] 12/06/2015  . Vitamin B12 deficiency anemia [D51.9] 02/25/2015  . Absolute anemia [D64.9] 02/25/2015  . Carotid stenosis [I65.29] 02/10/2015  . Personal history of other specified conditions [Z87.898] 11/27/2014  . Osteomyelitis ([place]) [M86.9] 11/27/2014  . H/O coronary artery bypass surgery [Z95.1] 05/18/2014   Total Time spent with patient: 20 minutes  Past Psychiatric History: depression.  Past Medical History:  Past Medical History:  Diagnosis Date  . Anemia   . Anginal pain ([place])   . Anxiety   . 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  This was done at [facility]  .  BP (high blood pressure) 02/03/2016  . Cancer ([place])    melanoma skin cancer  . Carotid stenosis 02/10/2015  . Chronic kidney disease    UTI  . Colitis 12/15/2015  . Collagen vascular disease ([place])   . COPD (chronic obstructive pulmonary disease) ([place])   . Coronary artery disease   . Depression   . Esophageal candidiasis ([place])   . Gastritis   . GERD (gastroesophageal reflux disease)   . GI bleed 12/15/2015  . Headache   . Hypertension   . Ischemic colitis ([place]) 01/01/2016   Overview:   SMA mesenteric ischemia now status post angioplasty by vascular of In stent restenosis.   Overview:  S?P SMA angioplasty   . Myocardial infarction   . Nausea and vomiting in adult   . Noninfectious diarrhea   . Peripheral vascular disease ([place]) 02/03/2016   Overview:  Follows with Dr. Delana Meyer    . Shortness of breath dyspnea    with exertion  . Stroke [facility])    TIA X 2  . Temporary cerebral vascular dysfunction 02/03/2016    Past Surgical History:  Procedure Laterality Date  . ABDOMINAL HYSTERECTOMY    . BACK SURGERY    . BREAST BIOPSY Left 6 2017  results not in yet  . COLONOSCOPY WITH PROPOFOL N/A 01/24/2016   Procedure: COLONOSCOPY WITH PROPOFOL;  Surgeon: Lucilla Lame, MD;  Location: [facility] ENDOSCOPY;  Service: Endoscopy;  Laterality: N/A;  . CORONARY ANGIOPLASTY    . CORONARY ARTERY BYPASS GRAFT    . ESOPHAGOGASTRODUODENOSCOPY (EGD) WITH PROPOFOL N/A 01/24/2016   Procedure: ESOPHAGOGASTRODUODENOSCOPY (EGD) WITH PROPOFOL;  Surgeon: Lucilla Lame, MD;  Location: [facility] ENDOSCOPY;  Service: Endoscopy;  Laterality: N/A;  . KYPHOPLASTY N/A 02/08/2016   Procedure: KYPHOPLASTY L1;  Surgeon: Hessie Knows, MD;  Location: [facility] ORS;  Service: Orthopedics;  Laterality: N/A;  . KYPHOPLASTY N/A 03/28/2016   Procedure: KYPHOPLASTY;  Surgeon: Hessie Knows, MD;  Location: [facility] ORS;  Service: Orthopedics;  Laterality: N/A;  . NOSE SURGERY     cancer removal  . OTHER SURGICAL HISTORY  Jul 11 2014   sternum removal  .  PERIPHERAL VASCULAR CATHETERIZATION N/A 02/10/2015   Procedure: Carotid PTA/Stent Intervention;  Surgeon: Katha Cabal, MD;  Location: [place] CV LAB;  Service: Cardiovascular;  Laterality: N/A;  . PERIPHERAL VASCULAR CATHETERIZATION N/A 12/17/2015   Procedure: Visceral Angiography;  Surgeon: Katha Cabal, MD;  Location: [place] CV LAB;  Service: Cardiovascular;  Laterality: N/A;  . PERIPHERAL VASCULAR CATHETERIZATION N/A 12/17/2015   Procedure: Visceral Artery Intervention;  Surgeon: Katha Cabal, MD;  Location: [place] CV LAB;  Service: Cardiovascular;  Laterality: N/A;  . STENT PLACEMENT VASCULAR ([place] HX)     Family History:  Family History  Problem Relation Age of Onset  . Breast cancer Sister   . CAD Father   . CAD Brother    Family Psychiatric  History: see H&P. Social History:  History  Alcohol Use No     History  Drug Use No    Social History   Social History  . Marital status: Married    Spouse name: N/A  . Number of children: N/A  . Years of education: N/A   Social History Main Topics  . Smoking status: Former Smoker    Packs/day: 1.00    Types: Cigarettes    Quit date: 08/01/2012  . Smokeless tobacco: Never Used  . Alcohol use No  . Drug use: No  . Sexual activity: Yes   Other Topics Concern  . None   Social History Narrative  . None   Additional Social History:                         Sleep: Fair  Appetite:  Poor  Current Medications: Current Facility-Administered Medications  Medication Dose Route Frequency Provider Last Rate Last Dose  . acetaminophen (TYLENOL) tablet 650 mg  650 mg Oral Q6H PRN Gonzella Lex, MD   650 mg at 10/09/16 0836  . alum & mag hydroxide-simeth (MAALOX/MYLANTA) 200-200-20 MG/5ML suspension 30 mL  30 mL Oral Q4H PRN Gonzella Lex, MD      . amLODipine (NORVASC) tablet 10 mg  10 mg Oral Daily Clovis Fredrickson, MD   10 mg at 10/10/16 0835  . atorvastatin (LIPITOR) tablet 80  mg  80 mg Oral q1800 Gonzella Lex, MD   80 mg at 10/04/16 2100  . clopidogrel (PLAVIX) tablet 75 mg  75 mg Oral Daily Gonzella Lex, MD   75 mg at 10/10/16 0834  . diphenhydrAMINE (BENADRYL) capsule 50 mg  50 mg Oral Q6H PRN Clovis Fredrickson, MD       Or  . diphenhydrAMINE (BENADRYL)  injection 50 mg  50 mg Intramuscular Q6H PRN Clovis Fredrickson, MD   50 mg at 10/05/16 0850  . diphenhydrAMINE (BENADRYL) capsule 50 mg  50 mg Oral Daily [name], MD   50 mg at 10/10/16 0834   Or  . diphenhydrAMINE (BENADRYL) injection 50 mg  50 mg Intramuscular Daily [name], MD   50 mg at 10/11/16 0928  . divalproex (DEPAKOTE) DR tablet 500 mg  500 mg Oral Q8H [name], MD   500 mg at 10/09/16 1526  . fluvoxaMINE (LUVOX) tablet 100 mg  100 mg Oral QHS [name], MD   100 mg at 10/04/16 2214  . furosemide (LASIX) tablet 20 mg  20 mg Oral Daily [name], MD   20 mg at 10/10/16 0835  . haloperidol lactate (HALDOL) injection 10 mg  10 mg Intramuscular Daily [name], MD   10 mg at 10/11/16 3710   Or  . haloperidol (HALDOL) tablet 10 mg  10 mg Oral Daily [name], MD   10 mg at 10/10/16 0834  . haloperidol (HALDOL) tablet 5 mg  5 mg Oral Q6H PRN [name], MD       Or  . haloperidol lactate (HALDOL) injection 5 mg  5 mg Intramuscular Q6H PRN [name], MD   5 mg at 10/07/16 2216  . hydrOXYzine (ATARAX/VISTARIL) tablet 25 mg  25 mg Oral Q6H PRN Lenward Chancellor, MD   25 mg at 10/09/16 1535  . LORazepam (ATIVAN) tablet 2 mg  2 mg Oral Q6H PRN Clovis Fredrickson, MD   2 mg at 10/08/16 2306   Or  . LORazepam (ATIVAN) injection 2 mg  2 mg Intramuscular Q6H PRN Clovis Fredrickson, MD   2 mg at 10/07/16 2216  . magnesium hydroxide (MILK OF MAGNESIA) suspension 30 mL  30 mL Oral Daily PRN Gonzella Lex, MD      . mometasone-formoterol [facility]) 200-5 MCG/ACT inhaler 2 puff  2 puff Inhalation BID Gonzella Lex, MD   2 puff at 10/04/16 2100  . nitroGLYCERIN (NITROSTAT) SL tablet 0.4 mg  0.4 mg Sublingual Q5 min PRN Gonzella Lex, MD      . OLANZapine (ZYPREXA) injection 15 mg  15 mg Intramuscular BID PRN Gonzella Lex, MD   15 mg at 10/10/16 2150  . OLANZapine zydis (ZYPREXA) disintegrating tablet 15 mg  15 mg Oral BID AC & HS [name], MD   15 mg at 10/10/16 0835  . pantoprazole (PROTONIX) EC tablet 40 mg  40 mg Oral Daily Gonzella Lex, MD   40 mg at 10/10/16 0834  . potassium chloride SA (K-DUR,KLOR-CON) CR tablet 10 mEq  10 mEq Oral Daily Clovis Fredrickson, MD   10 mEq at 10/10/16 0835  . tamsulosin (FLOMAX) capsule 0.4 mg  0.4 mg Oral Daily [name], MD   0.4 mg at 10/10/16 0835  . traZODone (DESYREL) tablet 50 mg  50 mg Oral QHS Gonzella Lex, MD   50 mg at 10/04/16 2214    Lab Results:  Results for orders placed or performed during the hospital encounter of 10/02/16 (from the past 48 hour(s))  Valproic acid level     Status: Abnormal   Collection Time: 10/10/16  7:39 AM  Result Value Ref Range   Valproic Acid Lvl 20 (L) 50.0 - 100.0 ug/mL  Ammonia     Status: None  Collection Time: 10/10/16  7:39 AM  Result Value Ref Range   Ammonia 14 9 - 35 umol/L  Hemoglobin A1c     Status: Abnormal   Collection Time: 10/10/16  7:39 AM  Result Value Ref Range   Hgb A1c MFr Bld 6.3 (H) 4.8 - 5.6 %    Comment: (NOTE)         Pre-diabetes: 5.7 - 6.4         Diabetes: >6.4         Glycemic control for adults with diabetes: <7.0    Mean Plasma Glucose 134 mg/dL    Comment: (NOTE) Performed At: [facility] [address] [place], [place] [id number] Lindon Romp MD ML:[id number]     Blood Alcohol level:  Lab Results  Component Value Date   [facility] [place] <5 10/02/2016   ETH <5 81/27/5170    Metabolic Disorder Labs: Lab Results  Component Value Date   HGBA1C 6.3 (H) 10/10/2016   MPG 134 10/10/2016   No results found for: PROLACTIN Lab Results   Component Value Date   CHOL 134 10/03/2016   TRIG 88 10/03/2016   HDL 53 10/03/2016   CHOLHDL 2.5 10/03/2016   VLDL 18 10/03/2016   LDLCALC 63 10/03/2016    Physical Findings: AIMS:  , ,  ,  ,    CIWA:    COWS:     Musculoskeletal: Strength & Muscle Tone: within normal limits Gait & Station: normal Patient leans: N/A  Psychiatric Specialty Exam: Physical Exam  Nursing note and vitals reviewed.   Review of Systems  Psychiatric/Behavioral: Positive for hallucinations and suicidal ideas. The patient is nervous/anxious.   All other systems reviewed and are negative.   Blood pressure (!) 122/94, pulse (!) 122, temperature 97.9 F (36.6 C), temperature source Oral, resp. rate 18, height _0  (1.549 m), weight 71.2 kg (157 lb), SpO2 99 %.Body mass index is 29.66 kg/m.  General Appearance: Disheveled  Eye Contact:  Good  Speech:  Pressured  Volume:  Increased  Mood:  Anxious  Affect:  Congruent, anxious  Thought Process:  Disorganized and Descriptions of Associations: Loose  Orientation:  Full (Time, Place, and Person)  Thought Content:  Illogical, Delusions, Paranoid Ideation and Rumination  Suicidal Thoughts:  denies  Homicidal Thoughts:  No  Memory:  Immediate;   Poor Recent;   Poor Remote;   Poor  Judgement:  Poor  Insight:  Lacking  Psychomotor Activity:  Increased  Concentration:  Concentration: Poor  Recall:  Poor  Fund of Knowledge:  Fair  Language:  Fair  Akathisia:  No  Handed:  Right  AIMS (if indicated):     Assets:  Communication Skills Desire for Improvement Financial Resources/Insurance Housing Resilience Social Support  ADL's:  Intact  Cognition:  WNL  Sleep:  Number of Hours: 8     Treatment Plan Summary: Daily contact with patient to assess and evaluate symptoms and progress in treatment and Medication management   Kirsten Castro is a 56 year old female with history of depression admitted for worsening of her symptoms, paranoia and  suicidal threats in the context of treatment noncompliance.  *   Agitation. Haldol, Ativan and Benadryl are available per forced medication order.  1. Suicidal ideation. The patient is able to contract for safety in the hospital.  2. Mood, anxiety and psychosis. We started Zyprexa and added Haldol for psychosis, Depakote for mood stabilization and Luvox for depression, anxiety, and trichotillomania. She is on forced medications now and  refuses oral  meds. VPA level 20, ammonia low. We increased Luvox to 200 mg.  3. Hypertension. She is on lisinopril, furosemide, and potassium. Blood pressure is normal.   4. Coronary artery disease. She is on Lipitor and nitroglycerin. We will order EKG when the patient is calmer.  5. GERD. She is on Protonix.  6. COPD. She is on [place].  7. Urinary retention. She is on Flomax.  8. Insomnia. Trazodone is available.  9. Metabolic syndrome monitoring. Lipid panel and TSH are normal. Hemoglobin A1c pending.   10. B12 deficiency. B12 level is low. We tried B12 injections but the patient refuses.  11. Disposition. She will be discharged to home with her husband. She will follow up with mental health professionals.  Orson Slick, MD 10/11/2016, 5:33 PM   Patient ID: Kirsten Castro, female   DOB: Jun 11, 1961, 56 y.o.   MRN: [id number] Patient ID: Kirsten Castro, female   DOB: 10-Jul-1961, 56 y.o.   MRN: [id number]

## 2016-10-11 NOTE — Progress Notes (Addendum)
Patient refused her PM medication stating, "I can't swallow.  I don't want water.  I don't want none of it.  It's going to make me choke.  I'm full of fluid.  Look at my stomach."  Patient is a force med order, however no IM injections were found for PM medication.  Writer spoke with Dr Weber Cooks who ordered IM Zyprexa BID PRN if patient refuses PO.  Patient agreed to take injection.

## 2016-10-12 MED ORDER — DIVALPROEX SODIUM 500 MG PO DR TAB
500.0000 mg | DELAYED_RELEASE_TABLET | Freq: Two times a day (BID) | ORAL | Status: DC
  Administered 2016-10-12 – 2016-10-20 (×13): 500 mg via ORAL
  Filled 2016-10-12 (×12): qty 1

## 2016-10-12 MED ORDER — FLUVOXAMINE MALEATE 50 MG PO TABS
200.0000 mg | ORAL_TABLET | Freq: Every day | ORAL | Status: DC
  Administered 2016-10-12 – 2016-10-19 (×7): 200 mg via ORAL
  Filled 2016-10-12 (×8): qty 4

## 2016-10-12 NOTE — Plan of Care (Signed)
Problem: Medication: Goal: Compliance with prescribed medication regimen will improve Outcome: Progressing Takes medications with much encouragement

## 2016-10-12 NOTE — Progress Notes (Signed)
Patient continues to be delusional.  Continue to believe that she is full of water and states that she cannot swallow.  Appetite is poor.  Was able to get patient to take her haldol, benadryl, zyprexa, amlodopine and plavix.  Patient was refusing medications but placed cup to her lips and she allowed this nurse to place medications in her mouth.  After taking medications called this Probation officer a [employer] manager.  Isolates to her room the majority of the time.  No group attendance.

## 2016-10-12 NOTE — [facility] Group Notes (Signed)
Goals Group Date/Time: 10/12/2016 9:00 AM Type of Therapy and Topic: Group Therapy: Goals Group: SMART Goals   Participation Level: Pt did not attend.  Description of Group:    The purpose of a daily goals group is to assist and guide patients in setting recovery/wellness-related goals. The objective is to set goals as they relate to the crisis in which they were admitted. Patients will be using SMART goal modalities to set measurable goals. Characteristics of realistic goals will be discussed and patients will be assisted in setting and processing how one will reach their goal. Facilitator will also assist patients in applying interventions and coping skills learned in psycho-education groups to the SMART goal and process how one will achieve defined goal.   Therapeutic Goals:   -Patients will develop and document one goal related to or their crisis in which brought them into treatment.  -Patients will be guided by LCSW using SMART goal setting modality in how to set a measurable, attainable, realistic and time sensitive goal.  -Patients will process barriers in reaching goal.  -Patients will process interventions in how to overcome and successful in reaching goal.   Patient's Goal:   Therapeutic Modalities:  Motivational Interviewing  Cognitive Behavioral Therapy  Crisis Intervention Model  SMART goals setting  Lurline Idol, LCSW

## 2016-10-12 NOTE — Tx Team (Signed)
Interdisciplinary Treatment and Diagnostic Plan Update  10/12/2016 Time of Session: 10:30am Kirsten Castro MRN: [id number]  Principal Diagnosis: Severe recurrent major depression with psychotic features [facility])  Secondary Diagnoses: Principal Problem:   Severe recurrent major depression with psychotic features ([place]) Active Problems:   CAD (coronary artery disease)   Benzodiazepine withdrawal ([place])   Trichotillomania   Current Medications:  Current Facility-Administered Medications  Medication Dose Route Frequency Provider Last Rate Last Dose  . acetaminophen (TYLENOL) tablet 650 mg  650 mg Oral Q6H PRN Gonzella Lex, MD   650 mg at 10/09/16 0836  . alum & mag hydroxide-simeth (MAALOX/MYLANTA) 200-200-20 MG/5ML suspension 30 mL  30 mL Oral Q4H PRN Gonzella Lex, MD      . amLODipine (NORVASC) tablet 10 mg  10 mg Oral Daily [name], MD   10 mg at 10/12/16 0850  . atorvastatin (LIPITOR) tablet 80 mg  80 mg Oral q1800 Gonzella Lex, MD   80 mg at 10/04/16 2100  . clopidogrel (PLAVIX) tablet 75 mg  75 mg Oral Daily Gonzella Lex, MD   75 mg at 10/12/16 0850  . diphenhydrAMINE (BENADRYL) capsule 50 mg  50 mg Oral Q6H PRN [name], MD       Or  . diphenhydrAMINE (BENADRYL) injection 50 mg  50 mg Intramuscular Q6H PRN [name], MD   50 mg at 10/05/16 0850  . diphenhydrAMINE (BENADRYL) capsule 50 mg  50 mg Oral Daily [name], MD   50 mg at 10/12/16 0849   Or  . diphenhydrAMINE (BENADRYL) injection 50 mg  50 mg Intramuscular Daily [name], MD   50 mg at 10/11/16 0928  . divalproex (DEPAKOTE) DR tablet 500 mg  500 mg Oral Q8H [name], MD   500 mg at 10/11/16 2206  . fluvoxaMINE (LUVOX) tablet 100 mg  100 mg Oral QHS [name], MD   100 mg at 10/11/16 2205  . furosemide (LASIX) tablet 20 mg  20 mg Oral Daily [name], MD   20 mg at 10/10/16 0835  . haloperidol lactate (HALDOL) injection 10 mg   10 mg Intramuscular Daily [name], MD   10 mg at 10/11/16 [id number]   Or  . haloperidol (HALDOL) tablet 10 mg  10 mg Oral Daily [name], MD   10 mg at 10/12/16 0849  . haloperidol (HALDOL) tablet 5 mg  5 mg Oral Q6H PRN [name], MD       Or  . haloperidol lactate (HALDOL) injection 5 mg  5 mg Intramuscular Q6H PRN [name], MD   5 mg at 10/07/16 2216  . hydrOXYzine (ATARAX/VISTARIL) tablet 25 mg  25 mg Oral Q6H PRN Lenward Chancellor, MD   25 mg at 10/09/16 1535  . LORazepam (ATIVAN) tablet 2 mg  2 mg Oral Q6H PRN Clovis Fredrickson, MD   2 mg at 10/08/16 2306   Or  . LORazepam (ATIVAN) injection 2 mg  2 mg Intramuscular Q6H PRN Clovis Fredrickson, MD   2 mg at 10/07/16 2216  . magnesium hydroxide (MILK OF MAGNESIA) suspension 30 mL  30 mL Oral Daily PRN Gonzella Lex, MD      . mometasone-formoterol [facility]) 200-5 MCG/ACT inhaler 2 puff  2 puff Inhalation BID Gonzella Lex, MD   2 puff at 10/11/16 2000  . nitroGLYCERIN (NITROSTAT) SL tablet 0.4 mg  0.4 mg Sublingual Q5 min  PRN Gonzella Lex, MD      . OLANZapine [facility]) injection 15 mg  15 mg Intramuscular BID PRN Gonzella Lex, MD   15 mg at 10/10/16 2150  . OLANZapine zydis (ZYPREXA) disintegrating tablet 15 mg  15 mg Oral BID AC & HS [name], MD   15 mg at 10/12/16 0849  . pantoprazole (PROTONIX) EC tablet 40 mg  40 mg Oral Daily Gonzella Lex, MD   40 mg at 10/10/16 0834  . potassium chloride SA (K-DUR,KLOR-CON) CR tablet 10 mEq  10 mEq Oral Daily Clovis Fredrickson, MD   10 mEq at 10/10/16 0835  . tamsulosin (FLOMAX) capsule 0.4 mg  0.4 mg Oral Daily [name], MD   0.4 mg at 10/10/16 0835  . traZODone (DESYREL) tablet 50 mg  50 mg Oral QHS Gonzella Lex, MD   50 mg at 10/11/16 2205   PTA Medications: Facility-Administered Medications Prior to Admission  Medication Dose Route Frequency Provider Last Rate Last Dose  . lidocaine (XYLOCAINE) 2 % jelly 1  application  1 application Urethral Once Nickie Retort, MD       Prescriptions Prior to Admission  Medication Sig Dispense Refill Last Dose  . acetaminophen (TYLENOL) 500 MG tablet Take 1,000 mg by mouth every 6 (six) hours as needed for mild pain or headache.   Taking  . ALPRAZolam (XANAX) 1 MG tablet Take 1 mg by mouth 3 (three) times daily as needed for anxiety or sleep.    Taking  . atorvastatin (LIPITOR) 80 MG tablet Take 80 mg by mouth daily.   Taking  . cetirizine (ZYRTEC) 10 MG tablet Take 10 mg by mouth daily. Reported on 02/08/2016   Not Taking  . clopidogrel (PLAVIX) 75 MG tablet Take 75 mg by mouth daily.    Taking  . clotrimazole (LOTRIMIN) 1 % cream APP EXT AA BID   Not Taking  . clotrimazole-betamethasone (LOTRISONE) cream Apply topically.   Not Taking  . cyclobenzaprine (FLEXERIL) 10 MG tablet Take 10 mg by mouth 3 (three) times daily as needed for muscle spasms.    Taking  . Fluticasone-Salmeterol (ADVAIR DISKUS) 250-50 MCG/DOSE AEPB Inhale 1 puff into the lungs 2 (two) times daily.    Taking  . furosemide (LASIX) 20 MG tablet Take 20 mg by mouth.   Taking  . hydrocortisone (ANUSOL-HC) 2.5 % rectal cream Apply topically.   Not Taking  . lisinopril (ZESTRIL) 10 MG tablet Take 1 tablet (10 mg total) by mouth daily. 60 tablet 1 Taking  . Magnesium 250 MG TABS Take 250 mg by mouth 2 (two) times daily.   Taking  . ondansetron (ZOFRAN) 4 MG tablet Take 1 tablet (4 mg total) by mouth daily as needed for nausea or vomiting. 20 tablet 1   . ondansetron (ZOFRAN-ODT) 4 MG disintegrating tablet Take 4 mg by mouth every 8 (eight) hours as needed.    Taking  . pantoprazole (PROTONIX) 40 MG tablet Take 1 tablet (40 mg total) by mouth 2 (two) times daily. 60 tablet 1 Taking  . phenazopyridine (PYRIDIUM) 100 MG tablet    Not Taking  . polyethylene glycol powder (GLYCOLAX/MIRALAX) powder    Not Taking  . potassium chloride (MICRO-K) 10 MEQ CR capsule Take 10 mEq by mouth 2 (two) times daily.     Taking  . senna-docusate (SENOKOT-S) 8.6-50 MG tablet Take 1 tablet by mouth at bedtime as needed for mild constipation.   Taking  .  tamsulosin (FLOMAX) 0.4 MG CAPS capsule TAKE ONE CAPSULE BY MOUTH DAILY WITH BREAKFAST   Not Taking  . traZODone (DESYREL) 50 MG tablet Take 50 mg by mouth at bedtime as needed.   Taking    Patient Stressors: Health problems Medication change or noncompliance  Patient Strengths: Ability for insight Capable of independent living Supportive family/friends  Treatment Modalities: Medication Management, Group therapy, Case management,  1 to 1 session with clinician, Psychoeducation, Recreational therapy.   Physician Treatment Plan for Primary Diagnosis: Severe recurrent major depression with psychotic features ([place]) Long Term Goal(s): Improvement in symptoms so as ready for discharge NA   Short Term Goals: Ability to identify changes in lifestyle to reduce recurrence of condition will improve Ability to verbalize feelings will improve Ability to disclose and discuss suicidal ideas Ability to demonstrate self-control will improve Ability to identify and develop effective coping behaviors will improve Ability to maintain clinical measurements within normal limits will improve Compliance with prescribed medications will improve Ability to identify triggers associated with substance abuse/mental health issues will improve NA  Medication Management: Evaluate patient's response, side effects, and tolerance of medication regimen.  Therapeutic Interventions: 1 to 1 sessions, Unit Group sessions and Medication administration.  Evaluation of Outcomes: Progressing  Physician Treatment Plan for Secondary Diagnosis: Principal Problem:   Severe recurrent major depression with psychotic features ([place]) Active Problems:   CAD (coronary artery disease)   Benzodiazepine withdrawal ([place])   Trichotillomania  Long Term Goal(s): Improvement in symptoms so as ready for  discharge NA   Short Term Goals: Ability to identify changes in lifestyle to reduce recurrence of condition will improve Ability to verbalize feelings will improve Ability to disclose and discuss suicidal ideas Ability to demonstrate self-control will improve Ability to identify and develop effective coping behaviors will improve Ability to maintain clinical measurements within normal limits will improve Compliance with prescribed medications will improve Ability to identify triggers associated with substance abuse/mental health issues will improve NA     Medication Management: Evaluate patient's response, side effects, and tolerance of medication regimen.  Therapeutic Interventions: 1 to 1 sessions, Unit Group sessions and Medication administration.  Evaluation of Outcomes: Progressing   RN Treatment Plan for Primary Diagnosis: Severe recurrent major depression with psychotic features ([place]) Long Term Goal(s): Knowledge of disease and therapeutic regimen to maintain health will improve  Short Term Goals:   Medication Management: RN will administer medications as ordered by provider, will assess and evaluate patient's response and provide education to patient for prescribed medication. RN will report any adverse and/or side effects to prescribing provider.  Therapeutic Interventions: 1 on 1 counseling sessions, Psychoeducation, Medication administration, Evaluate responses to treatment, Monitor vital signs and CBGs as ordered, Perform/monitor CIWA, COWS, AIMS and Fall Risk screenings as ordered, Perform wound care treatments as ordered.  Evaluation of Outcomes: Progressing   LCSW Treatment Plan for Primary Diagnosis: Severe recurrent major depression with psychotic features ([place]) Long Term Goal(s): Safe transition to appropriate next level of care at discharge, Engage patient in therapeutic group addressing interpersonal concerns.  Short Term Goals: Engage patient in aftercare  planning with referrals and resources and Increase skills for wellness and recovery  Therapeutic Interventions: Assess for all discharge needs, 1 to 1 time with Social worker, Explore available resources and support systems, Assess for adequacy in community support network, Educate family and significant other(s) on suicide prevention, Complete Psychosocial Assessment, Interpersonal group therapy.  Evaluation of Outcomes: Progressing   Progress in Treatment: Attending groups: No.  Participating in groups: No. Taking medication as prescribed: Yes. Toleration medication: Yes. Family/Significant other contact made: Yes, individual(s) contacted:  husband Patient understands diagnosis: Yes. Discussing patient identified problems/goals with staff: Yes. Medical problems stabilized or resolved: Yes. Denies suicidal/homicidal ideation: Yes. Issues/concerns per patient self-inventory: No. Other: n/a  New problem(s) identified: None identified at this time.   New Short Term/Long Term Goal(s): None identified at this time.   Discharge Plan or Barriers: CSW assessing appropriate discharge plan.   Reason for Continuation of Hospitalization: Delusions  Depression Medication stabilization  Estimated Length of Stay: 7 days  Attendees: Patient: Kirsten Castro 10/12/2016 12:01 PM  Physician: Dr. Orson Slick, MD 10/12/2016 12:01 PM  Nursing: Polly Cobia, RN 10/12/2016 12:01 PM  RN Care Manager: 10/12/2016 12:01 PM  Social Worker: Jen Mow. Satira Sark 10/12/2016 12:01 PM  Recreational Therapist: Leonette Monarch, LRT/CTRS 10/12/2016 12:01 PM  Other:  10/12/2016 12:01 PM  Other:  10/12/2016 12:01 PM  Other: 10/12/2016 12:01 PM    Scribe for Treatment Team: Jolaine Click, LCSWA 10/12/2016 12:05 PM

## 2016-10-12 NOTE — [facility] Group Notes (Signed)
[place] Group Notes:  (Nursing/MHT/Case Management/Adjunct)  Date:  10/12/2016  Time:  3:50 PM  Type of Therapy:  Psychoeducational Skills  Participation Level:  Did Not Attend  Charise Killian 10/12/2016, 3:50 PM

## 2016-10-12 NOTE — Progress Notes (Signed)
Recreation Therapy Notes  Date: 01.11.18 Time: 9:30 am Location: Craft Room  Group Topic: Leisure Education  Goal Area(s) Addresses:  Patient will identify activities for each letter of the alphabet. Patient will verbalize ability to integrate positive leisure into life post d/c. Patient will verbalize ability to use leisure as a Technical sales engineer.  Behavioral Response: Did not attend  Intervention: Leisure Alphabet  Activity: Patients were given a Leisure Air traffic controller and were instructed to write healthy leisure activities for each letter of the alphabet.  Education: LRT educated patients on what they need to participate in leisure.  Education Outcome: Patient did not attend group.   Clinical Observations/Feedback: Patient did not attend group.  Leonette Monarch, LRT/CTRS 10/12/2016 10:05 AM

## 2016-10-12 NOTE — [facility] Group Notes (Signed)
[place] LCSW Group Therapy Note  Date/Time: 10/12/16 1300  Type of Therapy/Topic:  Group Therapy:  Balance in Life  Participation Level:  Pt was invited but did not attend group.  Description of Group:    This group will address the concept of balance and how it feels and looks when one is unbalanced. Patients will be encouraged to process areas in their lives that are out of balance, and identify reasons for remaining unbalanced. Facilitators will guide patients utilizing problem- solving interventions to address and correct the stressor making their life unbalanced. Understanding and applying boundaries will be explored and addressed for obtaining  and maintaining a balanced life. Patients will be encouraged to explore ways to assertively make their unbalanced needs known to significant others in their lives, using other group members and facilitator for support and feedback.  Therapeutic Goals: 1. Patient will identify two or more emotions or situations they have that consume much of in their lives. 2. Patient will identify signs/triggers that life has become out of balance:  3. Patient will identify two ways to set boundaries in order to achieve balance in their lives:  4. Patient will demonstrate ability to communicate their needs through discussion and/or role plays  Summary of Patient Progress:    Therapeutic Modalities:   Cognitive Behavioral Therapy Solution-Focused Therapy Assertiveness Training  Lurline Idol, LCSW

## 2016-10-12 NOTE — Progress Notes (Signed)
[facility] MD Progress Note  10/12/2016 12:17 PM Kirsten Castro  MRN:  [id number]  Subjective:   Kirsten Castro has a history of depression, possibly bipolar disorder. She is floridly psychotic and refused medications twice in the past 24 hours. FORCED MEDICATION order since 10/04/2016.  10/08/2016 Pt seen, chart reviewed, discussed with nursing staff. Pt is reluctant to take med, but taking, had breakfast in am. States mood is " so so ", no ne w complaints. on FMP. Pt still psychotic, delusional, paranoid as before. Pt states she is anxious, and nothing will help, states she hears voices as before. Denies SI/HI.   10/09/2016. Kirsten Castro remains uncooperative and rude. She is on forced medication order. She composes taking any medications by mouth or by injection with an argument that she has not been eating any food here, therefore she should not be given any medications. She still wants me to check her body for "fluids". SW spoke with the husband who believes that all her problems started following bypass surgery with complications leading to confusion and behavioral changes. We will continue forced medication orders as the patient is still psychotic and unable to make educated decision about her treatment.  10/10/2016. Kirsten Castro met with the treatment team for the first time today. She is very anxious appearing, making strange noises, unable to sit still, fidgety, and slightly agitated. She however was able to have a brief conversation with her treatment team. She was telling us again that she is unable to take any medications and should not be given any because she is "full of water and doesn't eat anything here". She did take her medications today with encouragement. She does better when we offered her Coca-Cola to drink. She also signed her name on the form indicating that she participated in the meeting with no hesitation. She agreed to draw labs trhis morning. Unfortunately VPA level is only 20 as she  refused most of her doses. The patient is unconvinced that medications are helpful. Moreover she believes that it's "too late" now for treatment. This statement suggests a diagnosis of depression with psychotic features.  10/11/2016. Kirsten Castro appears slightly better today. She ate half of her breakfast and lunch. She properly introduced herself to a staff member this morning. On direkt questioning, she tells me that she is feeling "no good". She refused medications even with encouragement and a bribe with Coca cola. She complains of being full of water, unable to drink, or eat, unable to urinate. Indeed, she has a history of urinary retention. Per nursing, she wears a diaper but in spite of her complaints of "water coming from the front and the back", her diaper is frequently dry. She refuses Flomax, even though she knows why this was prescribed. She refuses blood pressure medications but her blood pressure has not been elevated. She adamantly refuses to see a Conservator, museum/gallery. I will offer it to her tomorrow again.  10/12/2016. There is no change. She did take some medications this morning but refused most of them. We continue Haldol, Zyprexa and Depakote for psychosis and mood stabilization. She refuses to see medical doctor.  Per nursing; D: Pt denies SI/HI/AVH. Pt is verbally aggressive and agitated, using various profanities towards staff. Pt complaint with medication after much persuasion, she appears anxious and she is not interacting with peers and staff appropriately.  A: Pt was offered support and encouragement. Pt was given scheduled medications. Pt was encouraged to attend groups. Q 15 minute checks were done  for safety.  R:Pt did not attend group. Pt is taking medication. Patient is not receptive to treatment. Safety maintained on unit.  Principal Problem: Severe recurrent major depression with psychotic features [facility]) Diagnosis:   Patient Active Problem List   Diagnosis Date Noted  .  Severe recurrent major depression with psychotic features ([place]) [F33.3] 10/02/2016  . Benzodiazepine withdrawal ([place]) [F13.239] 10/02/2016  . 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 Abdominal pain [R10.9] 04/19/2016  . Sepsis ([place]) [A41.9] 03/09/2016  . Urinary retention [R33.9] 03/09/2016  . CAD (coronary artery disease) [I25.10] 03/09/2016  . Frank hematuria [R31.0] 03/08/2016  . Temporary cerebral vascular dysfunction [G93.9] 02/03/2016  . Peripheral vascular disease ([place]) [I73.9] 02/03/2016  . BP (high blood pressure) [I10] 02/03/2016  . HLD (hyperlipidemia) [E78.5] 02/03/2016  . Clinical depression [F32.9] 02/03/2016  . Carotid artery narrowing [I65.29] 02/03/2016  . 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 Malignant neoplastic disease ([place]) [C80.1] 02/03/2016  . Anxiety [F41.9] 02/03/2016  . Abdominal pain, generalized [R10.84]   . Nausea with vomiting [R11.2]   . Gastritis [K29.70]   . Esophageal candidiasis ([place]) [B37.81]   . Noninfectious diarrhea [K52.9]   . Intractable pain [R52] 01/21/2016  . Upper abdominal pain [R10.10]   . Nausea and vomiting in adult [R11.2]   . Ischemic colitis ([place]) [K55.9] 01/01/2016  . Colitis [K52.9] 12/15/2015  . GI bleed [K92.2] 12/15/2015  . Acute inflammation of the pancreas [K85.90] 12/06/2015  . Vitamin B12 deficiency anemia [D51.9] 02/25/2015  . Absolute anemia [D64.9] 02/25/2015  . Carotid stenosis [I65.29] 02/10/2015  . Personal history of other specified conditions [Z87.898] 11/27/2014  . Osteomyelitis ([place]) [M86.9] 11/27/2014  . H/O coronary artery bypass surgery [Z95.1] 05/18/2014   Total Time spent with patient: 20 minutes  Past Psychiatric History: depression.  Past Medical History:  Past Medical History:  Diagnosis Date  . Anemia   . Anginal pain ([place])   . Anxiety   . 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  This was done at [facility]  . BP (high blood pressure) 02/03/2016  . Cancer ([place])    melanoma skin cancer  . Carotid stenosis 02/10/2015  . Chronic kidney disease    UTI  . Colitis 12/15/2015  . Collagen vascular disease ([place])   . COPD (chronic obstructive pulmonary disease) ([place])   . Coronary artery disease   . Depression   . Esophageal candidiasis ([place])   . Gastritis   . GERD (gastroesophageal reflux disease)   . GI bleed 12/15/2015  . Headache   . Hypertension   . Ischemic colitis ([place]) 01/01/2016   Overview:   SMA mesenteric ischemia now status post angioplasty by vascular of In stent restenosis.   Overview:  S?P SMA angioplasty   . Myocardial infarction   . Nausea and vomiting in adult   . Noninfectious diarrhea   . Peripheral vascular disease ([place]) 02/03/2016   Overview:  Follows with Dr. Delana Meyer    . Shortness of breath dyspnea    with exertion  . Stroke [facility])    TIA X  2  . Temporary cerebral vascular dysfunction 02/03/2016    Past Surgical History:  Procedure Laterality Date  . ABDOMINAL HYSTERECTOMY    . BACK SURGERY    . BREAST BIOPSY Left 6 2017   results not in yet  . COLONOSCOPY WITH PROPOFOL N/A 01/24/2016   Procedure: COLONOSCOPY WITH PROPOFOL;  Surgeon: Lucilla Lame, MD;  Location: [facility] ENDOSCOPY;  Service: Endoscopy;  Laterality: N/A;  . CORONARY ANGIOPLASTY    . CORONARY ARTERY BYPASS GRAFT    . ESOPHAGOGASTRODUODENOSCOPY (EGD) WITH PROPOFOL N/A 01/24/2016   Procedure: ESOPHAGOGASTRODUODENOSCOPY (EGD) WITH PROPOFOL;  Surgeon: Lucilla Lame, MD;  Location: [facility] ENDOSCOPY;  Service: Endoscopy;  Laterality: N/A;  . KYPHOPLASTY N/A 02/08/2016   Procedure:  KYPHOPLASTY L1;  Surgeon: Hessie Knows, MD;  Location: [facility] ORS;  Service: Orthopedics;  Laterality: N/A;  . KYPHOPLASTY N/A 03/28/2016   Procedure: KYPHOPLASTY;  Surgeon: Hessie Knows, MD;  Location: [facility] ORS;  Service: Orthopedics;  Laterality: N/A;  . NOSE SURGERY     cancer removal  . OTHER SURGICAL HISTORY  Jul 11 2014   sternum removal  . PERIPHERAL VASCULAR CATHETERIZATION N/A 02/10/2015   Procedure: Carotid PTA/Stent Intervention;  Surgeon: Katha Cabal, MD;  Location: [place] CV LAB;  Service: Cardiovascular;  Laterality: N/A;  . PERIPHERAL VASCULAR CATHETERIZATION N/A 12/17/2015   Procedure: Visceral Angiography;  Surgeon: Katha Cabal, MD;  Location: [place] CV LAB;  Service: Cardiovascular;  Laterality: N/A;  . PERIPHERAL VASCULAR CATHETERIZATION N/A 12/17/2015   Procedure: Visceral Artery Intervention;  Surgeon: Katha Cabal, MD;  Location: [place] CV LAB;  Service: Cardiovascular;  Laterality: N/A;  . STENT PLACEMENT VASCULAR ([place] [facility])     Family History:  Family History  Problem Relation Age of Onset  . Breast cancer Sister   . CAD Father   . CAD Brother    Family Psychiatric  History: see H&P. Social History:  History  Alcohol Use No     History  Drug Use No    Social History   Social History  . Marital status: Married    Spouse name: N/A  . Number of children: N/A  . Years of education: N/A   Social History Main Topics  . Smoking status: Former Smoker    Packs/day: 1.00    Types: Cigarettes    Quit date: 08/01/2012  . Smokeless tobacco: Never Used  . Alcohol use No  . Drug use: No  . Sexual activity: Yes   Other Topics Concern  . None   Social History Narrative  . None   Additional Social History:                         Sleep: Fair  Appetite:  Poor  Current Medications: Current Facility-Administered Medications  Medication Dose Route Frequency Provider Last Rate Last Dose  . acetaminophen  (TYLENOL) tablet 650 mg  650 mg Oral Q6H PRN Gonzella Lex, MD   650 mg at 10/09/16 0836  . alum & mag hydroxide-simeth (MAALOX/MYLANTA) 200-200-20 MG/5ML suspension 30 mL  30 mL Oral Q4H PRN Gonzella Lex, MD      . amLODipine (NORVASC) tablet 10 mg  10 mg Oral Daily Josaphine Shimamoto B Ever Halberg, MD   10 mg at 10/12/16 0850  . atorvastatin (LIPITOR) tablet 80 mg  80 mg Oral q1800 Gonzella Lex, MD   80 mg at 10/04/16 2100  . clopidogrel (PLAVIX) tablet 75 mg  75 mg Oral Daily  Gonzella Lex, MD   75 mg at 10/12/16 0850  . diphenhydrAMINE (BENADRYL) capsule 50 mg  50 mg Oral Q6H PRN Aalyah Mansouri B Lajuane Leatham, MD       Or  . diphenhydrAMINE (BENADRYL) injection 50 mg  50 mg Intramuscular Q6H PRN Azelea Seguin B Elisheva Fallas, MD   50 mg at 10/05/16 0850  . diphenhydrAMINE (BENADRYL) capsule 50 mg  50 mg Oral Daily Galina Haddox B Jakerra Floyd, MD   50 mg at 10/12/16 0849   Or  . diphenhydrAMINE (BENADRYL) injection 50 mg  50 mg Intramuscular Daily Allisyn Kunz B Victorious Cosio, MD   50 mg at 10/11/16 0928  . divalproex (DEPAKOTE) DR tablet 500 mg  500 mg Oral Q8H Amara Justen B Yeiren Whitecotton, MD   500 mg at 10/11/16 2206  . fluvoxaMINE (LUVOX) tablet 100 mg  100 mg Oral QHS Alphus Zeck B Izaha Shughart, MD   100 mg at 10/11/16 2205  . furosemide (LASIX) tablet 20 mg  20 mg Oral Daily Slayden Mennenga B Mayan Kloepfer, MD   20 mg at 10/10/16 0835  . haloperidol lactate (HALDOL) injection 10 mg  10 mg Intramuscular Daily Vimal Derego B Janisse Ghan, MD   10 mg at 10/11/16 7867   Or  . haloperidol (HALDOL) tablet 10 mg  10 mg Oral Daily Rettie Laird B Evian Salguero, MD   10 mg at 10/12/16 0849  . haloperidol (HALDOL) tablet 5 mg  5 mg Oral Q6H PRN Teion Ballin B Kayliee Atienza, MD       Or  . haloperidol lactate (HALDOL) injection 5 mg  5 mg Intramuscular Q6H PRN Kirbie Stodghill B Lashaye Fisk, MD   5 mg at 10/07/16 2216  . hydrOXYzine (ATARAX/VISTARIL) tablet 25 mg  25 mg Oral Q6H PRN Lenward Chancellor, MD   25 mg at 10/09/16 1535  . LORazepam (ATIVAN) tablet 2 mg  2 mg Oral Q6H PRN Clovis Fredrickson, MD   2 mg at 10/08/16 2306   Or  . LORazepam (ATIVAN) injection 2 mg  2 mg Intramuscular Q6H PRN Clovis Fredrickson, MD   2 mg at 10/07/16 2216  . magnesium hydroxide (MILK OF MAGNESIA) suspension 30 mL  30 mL Oral Daily PRN Gonzella Lex, MD      . mometasone-formoterol [facility]) 200-5 MCG/ACT inhaler 2 puff  2 puff Inhalation BID Gonzella Lex, MD   2 puff at 10/11/16 2000  . nitroGLYCERIN (NITROSTAT) SL tablet 0.4 mg  0.4 mg Sublingual Q5 min PRN Gonzella Lex, MD      . OLANZapine (ZYPREXA) injection 15 mg  15 mg Intramuscular BID PRN Gonzella Lex, MD   15 mg at 10/10/16 2150  . OLANZapine zydis (ZYPREXA) disintegrating tablet 15 mg  15 mg Oral BID AC & HS Eron Goble B Gardy Montanari, MD   15 mg at 10/12/16 0849  . pantoprazole (PROTONIX) EC tablet 40 mg  40 mg Oral Daily Gonzella Lex, MD   40 mg at 10/10/16 0834  . potassium chloride SA (K-DUR,KLOR-CON) CR tablet 10 mEq  10 mEq Oral Daily Clovis Fredrickson, MD   10 mEq at 10/10/16 0835  . tamsulosin (FLOMAX) capsule 0.4 mg  0.4 mg Oral Daily Eithen Castiglia B Siboney Requejo, MD   0.4 mg at 10/10/16 0835  . traZODone (DESYREL) tablet 50 mg  50 mg Oral QHS Gonzella Lex, MD   50 mg at 10/11/16 2205    Lab Results:  No results found for this or any previous visit (from the past 48 hour(s)).  Blood Alcohol level:  Lab Results  Component Value Date   [facility] <5 10/02/2016   ETH <5 50/93/2671    Metabolic Disorder Labs: Lab Results  Component Value Date   HGBA1C 6.3 (H) 10/10/2016   MPG 134 10/10/2016   No results found for: PROLACTIN Lab Results  Component Value Date   CHOL 134 10/03/2016   TRIG 88 10/03/2016   HDL 53 10/03/2016   CHOLHDL 2.5 10/03/2016   VLDL 18 10/03/2016   LDLCALC 63 10/03/2016    Physical Findings: AIMS:  , ,  ,  ,    CIWA:    COWS:     Musculoskeletal: Strength & Muscle Tone: within normal limits Gait & Station: normal Patient leans: N/A  Psychiatric Specialty Exam: Physical Exam  Nursing  note and vitals reviewed.   Review of Systems  Psychiatric/Behavioral: Positive for hallucinations and suicidal ideas. The patient is nervous/anxious.   All other systems reviewed and are negative.   Blood pressure (!) 125/99, pulse (!) 101, temperature 98.9 F (37.2 C), temperature source Oral, resp. rate 18, height 5' 1"  (1.549 m), weight 71.2 kg (157 lb), SpO2 99 %.Body mass index is 29.66 kg/m.  General Appearance: Disheveled  Eye Contact:  Good  Speech:  Pressured  Volume:  Increased  Mood:  Anxious  Affect:  Congruent, anxious  Thought Process:  Disorganized and Descriptions of Associations: Loose  Orientation:  Full (Time, Place, and Person)  Thought Content:  Illogical, Delusions, Paranoid Ideation and Rumination  Suicidal Thoughts:  denies  Homicidal Thoughts:  No  Memory:  Immediate;   Poor Recent;   Poor Remote;   Poor  Judgement:  Poor  Insight:  Lacking  Psychomotor Activity:  Increased  Concentration:  Concentration: Poor  Recall:  Poor  Fund of Knowledge:  Fair  Language:  Fair  Akathisia:  No  Handed:  Right  AIMS (if indicated):     Assets:  Communication Skills Desire for Improvement Financial Resources/Insurance Housing Resilience Social Support  ADL's:  Intact  Cognition:  WNL  Sleep:  Number of Hours: 7     Treatment Plan Summary: Daily contact with patient to assess and evaluate symptoms and progress in treatment and Medication management   Kirsten Castro is a 56 year old female with history of depression admitted for worsening of her symptoms, paranoia and suicidal threats in the context of treatment noncompliance.  *   Agitation. Haldol, Ativan and Benadryl are available per forced medication order.  1. Suicidal ideation. The patient is able to contract for safety in the hospital.  2. Mood, anxiety and psychosis. We started Zyprexa and Haldol for psychosis, Depakote for mood stabilization and Luvox for depression, anxiety, and  trichotillomania. She is on forced medications now and refuses oral  meds. VPA level 20, ammonia low on 10/10/2016.   3. Hypertension. She is on lisinopril, furosemide, and potassium. Blood pressure is normal.   4. Coronary artery disease. She is on Lipitor and nitroglycerin. We will order EKG when the patient is calmer.  5. GERD. She is on Protonix.  6. COPD. She is on [place].  7. Urinary retention. She is on Flomax.  8. Insomnia. Trazodone is available.  9. Metabolic syndrome monitoring. Lipid panel and TSH are normal. Hemoglobin A1c pending.   10. B12 deficiency. B12 level is low. We tried B12 injections but the patient refuses.  11. Disposition. She will be discharged to home with her husband. She will follow up with mental health professionals.  Orson Slick, MD 10/12/2016, 12:17 PM   Patient  ID: Marti Sleigh, female   DOB: Jun 28, 1961, 56 y.o.   MRN: [id number] Patient ID: MERIAN WROE, female   DOB: Oct 24, 1960, 56 y.o.   MRN: [id number]

## 2016-10-12 NOTE — Progress Notes (Signed)
D: Pt denies SI/HI/AVH. Pt is verbally aggressive and agitated, using various profanities towards staff. Pt complaint with medication after much persuasion, she appears anxious and she is not interacting with peers and staff appropriately.  A: Pt was offered support and encouragement. Pt was given scheduled medications. Pt was encouraged to attend groups. Q 15 minute checks were done for safety.  R:Pt did not attend group. Pt is taking medication. Patient is not receptive to treatment. Safety maintained on unit.

## 2016-10-13 NOTE — Progress Notes (Signed)
Recreation Therapy Notes  Date: 01.12.18 Time: 9:30 am Location: Craft Room  Group Topic: Stress Management  Goal Area(s) Addresses:  Patient will participate in relaxation techniques. Patient will verbalize benefit of using relaxation techniques.  Behavioral Response: Did not attend  Intervention: Relaxation Techniques  Activity: LRT educated and provided patients handouts on with relaxation techniques. Patients practiced the relaxation techniques.  Education: LRT educated patients on why the relaxation techniques are important.  Education Outcome: Patient did not attend group.  Clinical Observations/Feedback: Patient did not attend group.   Leonette Monarch, LRT/CTRS 10/13/2016 10:06 AM

## 2016-10-13 NOTE — Progress Notes (Signed)
D: Pt denies SI/HI/AVH but noted responding to internal stimuli. Patient's affect is flat, thoughts are disorganized.  Pt appears anxious, irritable and verbally aggressive and at times using profanities towards staff. Patient was advised to stop causing at staff. Patient is not interacting with peers and staff appropriately.  A: Pt was offered support and encouragement. Pt was given scheduled medications. Pt was encouraged to attend groups. Q 15 minute checks were done for safety.  R:Pt did not attend group. Pt is complaint with medication. Pt is not receptive to treatment. 15 minutes checks maintained on unit, will continue to monitor. Kirsten Castro

## 2016-10-13 NOTE — [facility] Group Notes (Signed)
[place] LCSW Group Therapy   10/13/2016 1 PM   Type of Therapy: Group Therapy   Participation Level: Pt invited but did not attend.  Participation Quality: Pt invited but did not attend.  Summary of Progress/Problems: The topic for today was feelings about relapse. Pt discussed what relapse prevention is to them and identified triggers that they are on the path to relapse. Pt processed their feeling towards relapse and was able to relate to peers. Pt discussed coping skills that can be used for relapse prevention.    Glorious Peach, MSW, LCSWA 10/13/2016, 2:24PM

## 2016-10-13 NOTE — Progress Notes (Addendum)
[facility] MD Progress Note  10/13/2016 12:52 PM Kirsten Castro  MRN:  [id number]  Subjective:   Kirsten Castro has a history of depression, possibly bipolar disorder. She is floridly psychotic and refused medications twice in the past 24 hours. FORCED MEDICATION order since 10/04/2016.  10/08/2016 Pt seen, chart reviewed, discussed with nursing staff. Pt is reluctant to take med, but taking, had breakfast in am. States mood is " so so ", no ne w complaints. on FMP. Pt still psychotic, delusional, paranoid as before. Pt states she is anxious, and nothing will help, states she hears voices as before. Denies SI/HI.   10/09/2016. Kirsten Castro remains uncooperative and rude. She is on forced medication order. She composes taking any medications by mouth or by injection with an argument that she has not been eating any food here, therefore she should not be given any medications. She still wants me to check her body for "fluids". SW spoke with the husband who believes that all her problems started following bypass surgery with complications leading to confusion and behavioral changes. We will continue forced medication orders as the patient is still psychotic and unable to make educated decision about her treatment.  10/10/2016. Kirsten Castro met with the treatment team for the first time today. She is very anxious appearing, making strange noises, unable to sit still, fidgety, and slightly agitated. She however was able to have a brief conversation with her treatment team. She was telling us again that she is unable to take any medications and should not be given any because she is "full of water and doesn't eat anything here". She did take her medications today with encouragement. She does better when we offered her Coca-Cola to drink. She also signed her name on the form indicating that she participated in the meeting with no hesitation. She agreed to draw labs trhis morning. Unfortunately VPA level is only 20 as she  refused most of her doses. The patient is unconvinced that medications are helpful. Moreover she believes that it's "too late" now for treatment. This statement suggests a diagnosis of depression with psychotic features.  10/11/2016. Kirsten Castro appears slightly better today. She ate half of her breakfast and lunch. She properly introduced herself to a staff member this morning. On direkt questioning, she tells me that she is feeling "no good". She refused medications even with encouragement and a bribe with Coca cola. She complains of being full of water, unable to drink, or eat, unable to urinate. Indeed, she has a history of urinary retention. Per nursing, she wears a diaper but in spite of her complaints of "water coming from the front and the back", her diaper is frequently dry. She refuses Flomax, even though she knows why this was prescribed. She refuses blood pressure medications but her blood pressure has not been elevated. She adamantly refuses to see a Conservator, museum/gallery. I will offer it to her tomorrow again.  10/12/2016. There is no change. She did take some medications this morning but refused most of them. We continue Haldol, Zyprexa and Depakote for psychosis and mood stabilization. She refuses to see medical doctor.  10/13/2016. For the first time today Kirsten Castro told me that she is doing "so-so". She then immediately preceded to her regular amount about being full of water and not able to take medications. She admitted that she ate half of the breakfast today. She refuses oral medications most of the time but accepts Haldol and Zyprexa by injection. She has been  missing her Plavix, antihypertensives, and Flomax. Blood pressure is stable. Heart rate is slightly elevated.  Per nursing; D: Pt denies SI/HI/AVH but noted responding to internal stimuli. Patient's affect is flat, thoughts are disorganized.  Pt appears anxious, irritable and verbally aggressive and at times using profanities  towards staff. Patient was advised to stop causing at staff. Patient is not interacting with peers and staff appropriately.  A: Pt was offered support and encouragement. Pt was given scheduled medications. Pt was encouraged to attend groups. Q 15 minute checks were done for safety.  R:Pt did not attend group. Pt is complaint with medication. Pt is not receptive to treatment. 15 minutes checks maintained on unit, will continue to monitor. .  Principal Problem: Severe recurrent major depression with psychotic features ([place]) Diagnosis:   Patient Active Problem List   Diagnosis Date Noted  . Severe recurrent major depression with psychotic features ([place]) [F33.3] 10/02/2016  . Benzodiazepine withdrawal ([place]) [F13.239] 10/02/2016  . 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 Abdominal pain [R10.9] 04/19/2016  . Sepsis ([place]) [A41.9] 03/09/2016  . Urinary retention [R33.9] 03/09/2016  . CAD (coronary artery disease) [I25.10] 03/09/2016  . Frank hematuria [R31.0] 03/08/2016  . Temporary cerebral vascular dysfunction [G93.9] 02/03/2016  . Peripheral vascular disease ([place]) [I73.9] 02/03/2016  . BP (high blood pressure) [I10] 02/03/2016  . HLD (hyperlipidemia) [E78.5] 02/03/2016  . Clinical depression [F32.9] 02/03/2016  . Carotid artery narrowing [I65.29] 02/03/2016  . 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 Malignant neoplastic disease ([place]) [C80.1] 02/03/2016  . Anxiety [F41.9] 02/03/2016  . Abdominal pain, generalized [R10.84]   . Nausea with vomiting [R11.2]   . Gastritis [K29.70]   . Esophageal candidiasis ([place]) [B37.81]   . Noninfectious diarrhea [K52.9]   . Intractable pain [R52] 01/21/2016  . Upper abdominal pain [R10.10]   . Nausea and vomiting in adult [R11.2]   . Ischemic colitis ([place]) [K55.9] 01/01/2016  . Colitis [K52.9] 12/15/2015  . GI bleed [K92.2] 12/15/2015  . Acute inflammation of the pancreas [K85.90] 12/06/2015  . Vitamin B12 deficiency  anemia [D51.9] 02/25/2015  . Absolute anemia [D64.9] 02/25/2015  . Carotid stenosis [I65.29] 02/10/2015  . Personal history of other specified conditions [Z87.898] 11/27/2014  . Osteomyelitis ([place]) [M86.9] 11/27/2014  . H/O coronary artery bypass surgery [Z95.1] 05/18/2014   Total Time spent with patient: 20 minutes  Past Psychiatric History: depression.  Past Medical History:  Past Medical History:  Diagnosis Date  . Anemia   . Anginal pain ([place])   . Anxiety   . 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  This was done at [facility]  . BP (high blood pressure) 02/03/2016  . Cancer ([place])    melanoma skin cancer  . Carotid stenosis 02/10/2015  . Chronic kidney disease    UTI  . Colitis 12/15/2015  . Collagen vascular disease ([place])   . COPD (chronic obstructive pulmonary disease) ([place])   . Coronary artery disease   . Depression   . Esophageal candidiasis ([place])   . Gastritis   . GERD (gastroesophageal reflux disease)   .  GI bleed 12/15/2015  . Headache   . Hypertension   . Ischemic colitis ([place]) 01/01/2016   Overview:   SMA mesenteric ischemia now status post angioplasty by vascular of In stent restenosis.   Overview:  S?P SMA angioplasty   . Myocardial infarction   . Nausea and vomiting in adult   . Noninfectious diarrhea   . Peripheral vascular disease ([place]) 02/03/2016   Overview:  Follows with Dr. Delana Meyer    . Shortness of breath dyspnea    with exertion  . Stroke [facility])    TIA X 2  . Temporary cerebral vascular dysfunction 02/03/2016    Past Surgical History:  Procedure Laterality Date  .  ABDOMINAL HYSTERECTOMY    . BACK SURGERY    . BREAST BIOPSY Left 6 2017   results not in yet  . COLONOSCOPY WITH PROPOFOL N/A 01/24/2016   Procedure: COLONOSCOPY WITH PROPOFOL;  Surgeon: Lucilla Lame, MD;  Location: [facility] ENDOSCOPY;  Service: Endoscopy;  Laterality: N/A;  . CORONARY ANGIOPLASTY    . CORONARY ARTERY BYPASS GRAFT    . ESOPHAGOGASTRODUODENOSCOPY (EGD) WITH PROPOFOL N/A 01/24/2016   Procedure: ESOPHAGOGASTRODUODENOSCOPY (EGD) WITH PROPOFOL;  Surgeon: Lucilla Lame, MD;  Location: [facility] ENDOSCOPY;  Service: Endoscopy;  Laterality: N/A;  . KYPHOPLASTY N/A 02/08/2016   Procedure: KYPHOPLASTY L1;  Surgeon: Hessie Knows, MD;  Location: [facility] ORS;  Service: Orthopedics;  Laterality: N/A;  . KYPHOPLASTY N/A 03/28/2016   Procedure: KYPHOPLASTY;  Surgeon: Hessie Knows, MD;  Location: [facility] ORS;  Service: Orthopedics;  Laterality: N/A;  . NOSE SURGERY     cancer removal  . OTHER SURGICAL HISTORY  Jul 11 2014   sternum removal  . PERIPHERAL VASCULAR CATHETERIZATION N/A 02/10/2015   Procedure: Carotid PTA/Stent Intervention;  Surgeon: Katha Cabal, MD;  Location: [place] [facility];  Service: Cardiovascular;  Laterality: N/A;  . PERIPHERAL VASCULAR CATHETERIZATION N/A 12/17/2015   Procedure: Visceral Angiography;  Surgeon: Katha Cabal, MD;  Location: [place] [facility];  Service: Cardiovascular;  Laterality: N/A;  . PERIPHERAL VASCULAR CATHETERIZATION N/A 12/17/2015   Procedure: Visceral Artery Intervention;  Surgeon: Katha Cabal, MD;  Location: [place] [facility];  Service: Cardiovascular;  Laterality: N/A;  . STENT PLACEMENT VASCULAR ([place] HX)     Family History:  Family History  Problem Relation Age of Onset  . Breast cancer Sister   . CAD Father   . CAD Brother    Family Psychiatric  History: see H&P. Social History:  History  Alcohol Use No     History  Drug Use No    Social History   Social History  . Marital status: Married    Spouse name: N/A  . Number  of children: N/A  . Years of education: N/A   Social History Main Topics  . Smoking status: Former Smoker    Packs/day: 1.00    Types: Cigarettes    Quit date: 08/01/2012  . Smokeless tobacco: Never Used  . Alcohol use No  . Drug use: No  . Sexual activity: Yes   Other Topics Concern  . None   Social History Narrative  . None   Additional Social History:                         Sleep: Fair  Appetite:  Poor  Current Medications: Current Facility-Administered Medications  Medication Dose Route Frequency Provider Last Rate Last Dose  . acetaminophen (TYLENOL) tablet 650 mg  650 mg  Oral Q6H PRN Gonzella Lex, MD   650 mg at 10/09/16 0836  . alum & mag hydroxide-simeth (MAALOX/MYLANTA) 200-200-20 MG/5ML suspension 30 mL  30 mL Oral Q4H PRN Gonzella Lex, MD      . amLODipine (NORVASC) tablet 10 mg  10 mg Oral Daily Demichael Traum B Simi Briel, MD   10 mg at 10/12/16 0850  . atorvastatin (LIPITOR) tablet 80 mg  80 mg Oral q1800 Gonzella Lex, MD   80 mg at 10/04/16 2100  . clopidogrel (PLAVIX) tablet 75 mg  75 mg Oral Daily Gonzella Lex, MD   75 mg at 10/12/16 0850  . divalproex (DEPAKOTE) DR tablet 500 mg  500 mg Oral Q12H Tanika Bracco B Rosser [place], MD   500 mg at 10/12/16 2100  . fluvoxaMINE (LUVOX) tablet 200 mg  200 mg Oral QHS Layn Kye B Saul Fabiano, MD   200 mg at 10/12/16 2100  . furosemide (LASIX) tablet 20 mg  20 mg Oral Daily Tonya Wantz B Tayjon Halladay, MD   20 mg at 10/10/16 0835  . haloperidol lactate (HALDOL) injection 10 mg  10 mg Intramuscular Daily Laureano Hetzer B Elgar Scoggins, MD   10 mg at 10/13/16 0857   Or  . haloperidol (HALDOL) tablet 10 mg  10 mg Oral Daily Spencer Cardinal B Yovan Leeman, MD   10 mg at 10/12/16 0849  . magnesium hydroxide (MILK OF MAGNESIA) suspension 30 mL  30 mL Oral Daily PRN Gonzella Lex, MD      . mometasone-formoterol [facility]) 200-5 MCG/ACT inhaler 2 puff  2 puff Inhalation BID Gonzella Lex, MD   2 puff at 10/12/16 2100  . nitroGLYCERIN (NITROSTAT) SL  tablet 0.4 mg  0.4 mg Sublingual Q5 min PRN Gonzella Lex, MD      . OLANZapine (ZYPREXA) injection 15 mg  15 mg Intramuscular BID PRN Gonzella Lex, MD   15 mg at 10/10/16 2150  . OLANZapine zydis (ZYPREXA) disintegrating tablet 15 mg  15 mg Oral BID AC & HS Samantha Ragen B Landrum Carbonell, MD   15 mg at 10/12/16 2100  . potassium chloride SA (K-DUR,KLOR-CON) CR tablet 10 mEq  10 mEq Oral Daily Clovis Fredrickson, MD   10 mEq at 10/10/16 0835  . tamsulosin (FLOMAX) capsule 0.4 mg  0.4 mg Oral Daily Lenox Ladouceur B Aizley Stenseth, MD   0.4 mg at 10/10/16 0835  . traZODone (DESYREL) tablet 50 mg  50 mg Oral QHS Gonzella Lex, MD   50 mg at 10/12/16 2250    Lab Results:  No results found for this or any previous visit (from the past 48 hour(s)).  Blood Alcohol level:  Lab Results  Component Value Date   ETH <5 10/02/2016   ETH <5 59/74/1638    Metabolic Disorder Labs: Lab Results  Component Value Date   HGBA1C 6.3 (H) 10/10/2016   MPG 134 10/10/2016   No results found for: PROLACTIN Lab Results  Component Value Date   CHOL 134 10/03/2016   TRIG 88 10/03/2016   HDL 53 10/03/2016   CHOLHDL 2.5 10/03/2016   VLDL 18 10/03/2016   LDLCALC 63 10/03/2016    Physical Findings: AIMS:  , ,  ,  ,    CIWA:    COWS:     Musculoskeletal: Strength & Muscle Tone: within normal limits Gait & Station: normal Patient leans: N/A  Psychiatric Specialty Exam: Physical Exam  Nursing note and vitals reviewed.   Review of Systems  Psychiatric/Behavioral: Positive for hallucinations and suicidal ideas. The patient  is nervous/anxious.   All other systems reviewed and are negative.   Blood pressure 134/82, pulse (!) 103, temperature 98.7 F (37.1 C), temperature source Oral, resp. rate 18, height 5' 1"  (1.549 m), weight 71.2 kg (157 lb), SpO2 99 %.Body mass index is 29.66 kg/m.  General Appearance: Disheveled  Eye Contact:  Good  Speech:  Pressured  Volume:  Increased  Mood:  Anxious  Affect:   Congruent, anxious  Thought Process:  Disorganized and Descriptions of Associations: Loose  Orientation:  Full (Time, Place, and Person)  Thought Content:  Illogical, Delusions, Paranoid Ideation and Rumination  Suicidal Thoughts:  denies  Homicidal Thoughts:  No  Memory:  Immediate;   Poor Recent;   Poor Remote;   Poor  Judgement:  Poor  Insight:  Lacking  Psychomotor Activity:  Increased  Concentration:  Concentration: Poor  Recall:  Poor  Fund of Knowledge:  Fair  Language:  Fair  Akathisia:  No  Handed:  Right  AIMS (if indicated):     Assets:  Communication Skills Desire for Improvement Financial Resources/Insurance Housing Resilience Social Support  ADL's:  Intact  Cognition:  WNL  Sleep:  Number of Hours: 6     Treatment Plan Summary: Daily contact with patient to assess and evaluate symptoms and progress in treatment and Medication management   Kirsten. Kops is a 56 year old female with history of depression admitted for worsening of her symptoms, paranoia and suicidal threats in the context of treatment noncompliance.  *   Agitation. Haldol, Ativan and Benadryl are available per forced medication order.  1. Suicidal ideation. The patient is able to contract for safety in the hospital.  2. Mood, anxiety and psychosis. We started Zyprexa and Haldol for psychosis, Depakote for mood stabilization and Luvox for depression, anxiety, and trichotillomania. She is on forced medications now and refuses oral  meds. VPA level 20, ammonia low on 10/10/2016.   3. Hypertension. She is on lisinopril, furosemide, and potassium. Blood pressure is normal.   4. Coronary artery disease. She is on Lipitor and nitroglycerin. We will order EKG when the patient is calmer.  5. GERD. She is on Protonix.  6. COPD. She is on [place].  7. Urinary retention. She is on Flomax.  8. Insomnia. Trazodone is available.  9. Metabolic syndrome monitoring. Lipid panel and TSH are normal.  Hemoglobin A1c 6.3.    10. B12 deficiency. B12 level is low. We tried B12 injections but the patient refuses.  11. Disposition. She will be discharged to home with her husband. She will follow up with mental health professionals.  Orson Slick, MD 10/13/2016, 12:52 PM   Patient ID: Kirsten Castro, female   DOB: 1961-08-29, 56 y.o.   MRN: [id number] Patient ID: Kirsten Castro, female   DOB: 1960-12-09, 56 y.o.   MRN: [id number]

## 2016-10-13 NOTE — Progress Notes (Signed)
When asked how are you doing patient states "Not good"  Unable to verbalize why she is not doing well.  Refused po medications.  When attempted to give po medications, patient states "give me a shot please, the pills make me sick"  [employer], no interaction with peers or staff.  Support and encouragement offered.  Safety maintained.

## 2016-10-13 NOTE — Progress Notes (Signed)
Patient ID: Kirsten Castro, female   DOB: [birth year], 55 y.o.   MRN: [id number]  [facility] referral sent for patient on this date. CSW is waiting for authorization # from [employer].  Kirsten Castro G. Claybon Jabs MSW, [facility] 10/13/2016 4:22 PM

## 2016-10-14 NOTE — Progress Notes (Signed)
Patient ID: YENY HUKILL, female   DOB: 08-31-1961, 56 y.o.   MRN: [id number]  CSW obtained authorization # from [employer] for [facility] referral for patient. CSW spoke staff member Richardson Landry with [facility], informed CSW that patient was placed on the waitlist at [facility].   Donalda Job G. [place], [place] 10/14/2016 10:06 AM

## 2016-10-14 NOTE — Progress Notes (Signed)
D: Pt denies SI/HI/AVH but noted responding to internal stimuli. Patient's affect is flat, thoughts are disorganized.  Pt appears less anxious. Patient is not interacting with peers and staff appropriately.  A: Pt was offered support and encouragement. Pt was given scheduled medications. Pt was encouraged to attend groups. Q 15 minute checks were done for safety.  R:Pt did not attend group. Pt is complaint with medication. Pt is not receptive to treatment. 15 minutes checks maintained on unit, will continue to monitor. Marland Kitchen

## 2016-10-14 NOTE — Progress Notes (Addendum)
[facility] MD Progress Note  10/14/2016 3:31 PM Kirsten Castro  MRN:  [id number]  Subjective:   Kirsten Castro has a history of depression, possibly bipolar disorder. She is floridly psychotic and refused medications twice in the past 24 hours. FORCED MEDICATION order since 10/04/2016.  10/08/2016 Pt seen, chart reviewed, discussed with nursing staff. Pt is reluctant to take med, but taking, had breakfast in am. States mood is " so so ", no ne w complaints. on FMP. Pt still psychotic, delusional, paranoid as before. Pt states she is anxious, and nothing will help, states she hears voices as before. Denies SI/HI.   10/09/2016. Kirsten Castro remains uncooperative and rude. She is on forced medication order. She composes taking any medications by mouth or by injection with an argument that she has not been eating any food here, therefore she should not be given any medications. She still wants me to check her body for "fluids". SW spoke with the husband who believes that all her problems started following bypass surgery with complications leading to confusion and behavioral changes. We will continue forced medication orders as the patient is still psychotic and unable to make educated decision about her treatment.  10/10/2016. Kirsten Castro met with the treatment team for the first time today. She is very anxious appearing, making strange noises, unable to sit still, fidgety, and slightly agitated. She however was able to have a brief conversation with her treatment team. She was telling us again that she is unable to take any medications and should not be given any because she is "full of water and doesn't eat anything here". She did take her medications today with encouragement. She does better when we offered her Coca-Cola to drink. She also signed her name on the form indicating that she participated in the meeting with no hesitation. She agreed to draw labs trhis morning. Unfortunately VPA level is only 20 as she  refused most of her doses. The patient is unconvinced that medications are helpful. Moreover she believes that it's "too late" now for treatment. This statement suggests a diagnosis of depression with psychotic features.  10/11/2016. Kirsten Castro appears slightly better today. She ate half of her breakfast and lunch. She properly introduced herself to a staff member this morning. On direkt questioning, she tells me that she is feeling "no good". She refused medications even with encouragement and a bribe with Coca cola. She complains of being full of water, unable to drink, or eat, unable to urinate. Indeed, she has a history of urinary retention. Per nursing, she wears a diaper but in spite of her complaints of "water coming from the front and the back", her diaper is frequently dry. She refuses Flomax, even though she knows why this was prescribed. She refuses blood pressure medications but her blood pressure has not been elevated. She adamantly refuses to see a Conservator, museum/gallery. I will offer it to her tomorrow again.  10/12/2016. There is no change. She did take some medications this morning but refused most of them. We continue Haldol, Zyprexa and Depakote for psychosis and mood stabilization. She refuses to see medical doctor.  10/13/2016. For the first time today Kirsten Castro told me that she is doing "so-so". She then immediately preceded to her regular amount about being full of water and not able to take medications. She admitted that she ate half of the breakfast today. She refuses oral medications most of the time but accepts Haldol and Zyprexa by injection. She has been  missing her Plavix, antihypertensives, and Flomax. Blood pressure is stable. Heart rate is slightly elevated.  Follow-up for the 13th. Patient seen. She seems a little confused. Didn't know where she was briefly. Still seems psychotic and agitated and refusing medication. Stays mostly isolated from others on the unit. No specific  new physical complaints  Per nursing; D: Pt denies SI/HI/AVH but noted responding to internal stimuli. Patient's affect is flat, thoughts are disorganized.  Pt appears anxious, irritable and verbally aggressive and at times using profanities towards staff. Patient was advised to stop causing at staff. Patient is not interacting with peers and staff appropriately.  A: Pt was offered support and encouragement. Pt was given scheduled medications. Pt was encouraged to attend groups. Q 15 minute checks were done for safety.  R:Pt did not attend group. Pt is complaint with medication. Pt is not receptive to treatment. 15 minutes checks maintained on unit, will continue to monitor. .  Principal Problem: Severe recurrent major depression with psychotic features ([place]) Diagnosis:   Patient Active Problem List   Diagnosis Date Noted  . Severe recurrent major depression with psychotic features ([place]) [F33.3] 10/02/2016  . Benzodiazepine withdrawal ([place]) [F13.239] 10/02/2016  . 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 Abdominal pain [R10.9] 04/19/2016  . Sepsis ([place]) [A41.9] 03/09/2016  . Urinary retention [R33.9] 03/09/2016  . CAD (coronary artery disease) [I25.10] 03/09/2016  . Frank hematuria [R31.0] 03/08/2016  . Temporary cerebral vascular dysfunction [G93.9] 02/03/2016  . Peripheral vascular disease ([place]) [I73.9] 02/03/2016  . BP (high blood pressure) [I10] 02/03/2016  . HLD (hyperlipidemia) [E78.5] 02/03/2016  . Clinical depression [F32.9] 02/03/2016  . Carotid artery narrowing [I65.29] 02/03/2016  . 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 Malignant neoplastic disease ([place]) [C80.1] 02/03/2016  . Anxiety [F41.9] 02/03/2016  . Abdominal pain, generalized [R10.84]   . Nausea with vomiting [R11.2]   . Gastritis [K29.70]   . Esophageal candidiasis ([place]) [B37.81]   . Noninfectious diarrhea [K52.9]   . Intractable pain [R52] 01/21/2016  . Upper abdominal pain  [R10.10]   . Nausea and vomiting in adult [R11.2]   . Ischemic colitis ([place]) [K55.9] 01/01/2016  . Colitis [K52.9] 12/15/2015  . GI bleed [K92.2] 12/15/2015  . Acute inflammation of the pancreas [K85.90] 12/06/2015  . Vitamin B12 deficiency anemia [D51.9] 02/25/2015  . Absolute anemia [D64.9] 02/25/2015  . Carotid stenosis [I65.29] 02/10/2015  . Personal history of other specified conditions [Z87.898] 11/27/2014  . Osteomyelitis ([place]) [M86.9] 11/27/2014  . H/O coronary artery bypass surgery [Z95.1] 05/18/2014   Total Time spent with patient: 20 minutes  Past Psychiatric History: depression.  Past Medical History:  Past Medical History:  Diagnosis Date  . Anemia   . Anginal pain ([place])   . Anxiety   . 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  This was done at [facility]  . BP (high blood pressure) 02/03/2016  . Cancer ([place])    melanoma skin cancer  . Carotid stenosis 02/10/2015  . Chronic kidney disease    UTI  . Colitis 12/15/2015  . Collagen vascular  disease ([place])   . COPD (chronic obstructive pulmonary disease) ([place])   . Coronary artery disease   . Depression   . Esophageal candidiasis ([place])   . Gastritis   . GERD (gastroesophageal reflux disease)   . GI bleed 12/15/2015  . Headache   . Hypertension   . Ischemic colitis ([place]) 01/01/2016   Overview:   SMA mesenteric ischemia now status post angioplasty by vascular of In stent restenosis.   Overview:  S?P SMA angioplasty   . Myocardial infarction   . Nausea and vomiting in adult   . Noninfectious diarrhea   . Peripheral vascular disease  ([place]) 02/03/2016   Overview:  Follows with Dr. Delana Meyer    . Shortness of breath dyspnea    with exertion  . Stroke [facility])    TIA X 2  . Temporary cerebral vascular dysfunction 02/03/2016    Past Surgical History:  Procedure Laterality Date  . ABDOMINAL HYSTERECTOMY    . BACK SURGERY    . BREAST BIOPSY Left 6 2017   results not in yet  . COLONOSCOPY WITH PROPOFOL N/A 01/24/2016   Procedure: COLONOSCOPY WITH PROPOFOL;  Surgeon: Lucilla Lame, MD;  Location: [facility] ENDOSCOPY;  Service: Endoscopy;  Laterality: N/A;  . CORONARY ANGIOPLASTY    . CORONARY ARTERY BYPASS GRAFT    . ESOPHAGOGASTRODUODENOSCOPY (EGD) WITH PROPOFOL N/A 01/24/2016   Procedure: ESOPHAGOGASTRODUODENOSCOPY (EGD) WITH PROPOFOL;  Surgeon: Lucilla Lame, MD;  Location: [facility] ENDOSCOPY;  Service: Endoscopy;  Laterality: N/A;  . KYPHOPLASTY N/A 02/08/2016   Procedure: KYPHOPLASTY L1;  Surgeon: Hessie Knows, MD;  Location: [facility] ORS;  Service: Orthopedics;  Laterality: N/A;  . KYPHOPLASTY N/A 03/28/2016   Procedure: KYPHOPLASTY;  Surgeon: Hessie Knows, MD;  Location: [facility] ORS;  Service: Orthopedics;  Laterality: N/A;  . NOSE SURGERY     cancer removal  . OTHER SURGICAL HISTORY  Jul 11 2014   sternum removal  . PERIPHERAL VASCULAR CATHETERIZATION N/A 02/10/2015   Procedure: Carotid PTA/Stent Intervention;  Surgeon: Katha Cabal, MD;  Location: [place] CV LAB;  Service: Cardiovascular;  Laterality: N/A;  . PERIPHERAL VASCULAR CATHETERIZATION N/A 12/17/2015   Procedure: Visceral Angiography;  Surgeon: Katha Cabal, MD;  Location: [place] CV LAB;  Service: Cardiovascular;  Laterality: N/A;  . PERIPHERAL VASCULAR CATHETERIZATION N/A 12/17/2015   Procedure: Visceral Artery Intervention;  Surgeon: Katha Cabal, MD;  Location: [place] CV LAB;  Service: Cardiovascular;  Laterality: N/A;  . STENT PLACEMENT VASCULAR ([place] HX)     Family History:  Family History  Problem Relation Age of Onset  . Breast cancer Sister    . CAD Father   . CAD Brother    Family Psychiatric  History: see H&P. Social History:  History  Alcohol Use No     History  Drug Use No    Social History   Social History  . Marital status: Married    Spouse name: N/A  . Number of children: N/A  . Years of education: N/A   Social History Main Topics  . Smoking status: Former Smoker    Packs/day: 1.00    Types: Cigarettes    Quit date: 08/01/2012  . Smokeless tobacco: Never Used  . Alcohol use No  . Drug use: No  . Sexual activity: Yes   Other Topics Concern  . None   Social History Narrative  . None   Additional Social History:  Sleep: Fair  Appetite:  Poor  Current Medications: Current Facility-Administered Medications  Medication Dose Route Frequency Provider Last Rate Last Dose  . acetaminophen (TYLENOL) tablet 650 mg  650 mg Oral Q6H PRN Gonzella Lex, MD   650 mg at 10/09/16 0836  . alum & mag hydroxide-simeth (MAALOX/MYLANTA) 200-200-20 MG/5ML suspension 30 mL  30 mL Oral Q4H PRN Gonzella Lex, MD      . amLODipine (NORVASC) tablet 10 mg  10 mg Oral Daily Clovis Fredrickson, MD   10 mg at 10/14/16 0858  . atorvastatin (LIPITOR) tablet 80 mg  80 mg Oral q1800 Gonzella Lex, MD   80 mg at 10/04/16 2100  . clopidogrel (PLAVIX) tablet 75 mg  75 mg Oral Daily Gonzella Lex, MD   75 mg at 10/14/16 0857  . divalproex (DEPAKOTE) DR tablet 500 mg  500 mg Oral Q12H [name], MD   500 mg at 10/14/16 0857  . fluvoxaMINE (LUVOX) tablet 200 mg  200 mg Oral QHS [name], MD   200 mg at 10/13/16 2200  . furosemide (LASIX) tablet 20 mg  20 mg Oral Daily [name], MD   20 mg at 10/14/16 0857  . haloperidol lactate (HALDOL) injection 10 mg  10 mg Intramuscular Daily [name], MD   10 mg at 10/13/16 0857   Or  . haloperidol (HALDOL) tablet 10 mg  10 mg Oral Daily Clovis Fredrickson, MD   10 mg at 10/14/16 0858  . magnesium hydroxide  (MILK OF MAGNESIA) suspension 30 mL  30 mL Oral Daily PRN Gonzella Lex, MD      . mometasone-formoterol [facility]) 200-5 MCG/ACT inhaler 2 puff  2 puff Inhalation BID Gonzella Lex, MD   2 puff at 10/14/16 0905  . nitroGLYCERIN (NITROSTAT) SL tablet 0.4 mg  0.4 mg Sublingual Q5 min PRN Gonzella Lex, MD      . OLANZapine (ZYPREXA) injection 15 mg  15 mg Intramuscular BID PRN Gonzella Lex, MD   15 mg at 10/10/16 2150  . OLANZapine zydis (ZYPREXA) disintegrating tablet 15 mg  15 mg Oral BID AC & HS [name], MD   15 mg at 10/14/16 0857  . potassium chloride SA (K-DUR,KLOR-CON) CR tablet 10 mEq  10 mEq Oral Daily Clovis Fredrickson, MD   10 mEq at 10/14/16 0858  . tamsulosin (FLOMAX) capsule 0.4 mg  0.4 mg Oral Daily [name], MD   0.4 mg at 10/14/16 0857  . traZODone (DESYREL) tablet 50 mg  50 mg Oral QHS Gonzella Lex, MD   50 mg at 10/13/16 2200    Lab Results:  No results found for this or any previous visit (from the past 48 hour(s)).  Blood Alcohol level:  Lab Results  Component Value Date   [facility] <5 10/02/2016   ETH <5 85/88/5027    Metabolic Disorder Labs: Lab Results  Component Value Date   HGBA1C 6.3 (H) 10/10/2016   MPG 134 10/10/2016   No results found for: PROLACTIN Lab Results  Component Value Date   CHOL 134 10/03/2016   TRIG 88 10/03/2016   HDL 53 10/03/2016   CHOLHDL 2.5 10/03/2016   VLDL 18 10/03/2016   LDLCALC 63 10/03/2016    Physical Findings: AIMS:  , ,  ,  ,    CIWA:    COWS:     Musculoskeletal: Strength & Muscle Tone: within normal limits Gait &  Station: normal Patient leans: N/A  Psychiatric Specialty Exam: Physical Exam  Nursing note and vitals reviewed.   Review of Systems  Psychiatric/Behavioral: Positive for hallucinations and suicidal ideas. The patient is nervous/anxious.   All other systems reviewed and are negative.  [facility] MD Progress Note  10/14/2016 3:36 PM Kirsten Castro  MRN:   [id number]  Subjective:   Kirsten Castro has a history of depression, possibly bipolar disorder. She is floridly psychotic and refused medications twice in the past 24 hours. FORCED MEDICATION order since 10/04/2016.  10/08/2016 Pt seen, chart reviewed, discussed with nursing staff. Pt is reluctant to take med, but taking, had breakfast in am. States mood is " so so ", no ne w complaints. on FMP. Pt still psychotic, delusional, paranoid as before. Pt states she is anxious, and nothing will help, states she hears voices as before. Denies SI/HI.   10/09/2016. Kirsten Castro remains uncooperative and rude. She is on forced medication order. She composes taking any medications by mouth or by injection with an argument that she has not been eating any food here, therefore she should not be given any medications. She still wants me to check her body for "fluids". SW spoke with the husband who believes that all her problems started following bypass surgery with complications leading to confusion and behavioral changes. We will continue forced medication orders as the patient is still psychotic and unable to make educated decision about her treatment.  10/10/2016. Kirsten Castro met with the treatment team for the first time today. She is very anxious appearing, making strange noises, unable to sit still, fidgety, and slightly agitated. She however was able to have a brief conversation with her treatment team. She was telling us again that she is unable to take any medications and should not be given any because she is "full of water and doesn't eat anything here". She did take her medications today with encouragement. She does better when we offered her Coca-Cola to drink. She also signed her name on the form indicating that she participated in the meeting with no hesitation. She agreed to draw labs trhis morning. Unfortunately VPA level is only 20 as she refused most of her doses. The patient is unconvinced that medications are  helpful. Moreover she believes that it's "too late" now for treatment. This statement suggests a diagnosis of depression with psychotic features.  10/11/2016. Kirsten Castro appears slightly better today. She ate half of her breakfast and lunch. She properly introduced herself to a staff member this morning. On direkt questioning, she tells me that she is feeling "no good". She refused medications even with encouragement and a bribe with Coca cola. She complains of being full of water, unable to drink, or eat, unable to urinate. Indeed, she has a history of urinary retention. Per nursing, she wears a diaper but in spite of her complaints of "water coming from the front and the back", her diaper is frequently dry. She refuses Flomax, even though she knows why this was prescribed. She refuses blood pressure medications but her blood pressure has not been elevated. She adamantly refuses to see a Conservator, museum/gallery. I will offer it to her tomorrow again.  10/12/2016. There is no change. She did take some medications this morning but refused most of them. We continue Haldol, Zyprexa and Depakote for psychosis and mood stabilization. She refuses to see medical doctor.  10/13/2016. For the first time today Kirsten Castro told me that she is doing "so-so". She then  immediately preceded to her regular amount about being full of water and not able to take medications. She admitted that she ate half of the breakfast today. She refuses oral medications most of the time but accepts Haldol and Zyprexa by injection. She has been missing her Plavix, antihypertensives, and Flomax. Blood pressure is stable. Heart rate is slightly elevated.  Per nursing; D: Pt denies SI/HI/AVH but noted responding to internal stimuli. Patient's affect is flat, thoughts are disorganized.  Pt appears anxious, irritable and verbally aggressive and at times using profanities towards staff. Patient was advised to stop causing at staff. Patient is not  interacting with peers and staff appropriately.  A: Pt was offered support and encouragement. Pt was given scheduled medications. Pt was encouraged to attend groups. Q 15 minute checks were done for safety.  R:Pt did not attend group. Pt is complaint with medication. Pt is not receptive to treatment. 15 minutes checks maintained on unit, will continue to monitor. .  Principal Problem: Severe recurrent major depression with psychotic features ([place]) Diagnosis:   Patient Active Problem List   Diagnosis Date Noted  . Severe recurrent major depression with psychotic features ([place]) [F33.3] 10/02/2016  . Benzodiazepine withdrawal ([place]) [F13.239] 10/02/2016  . 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 Abdominal pain [R10.9] 04/19/2016  . Sepsis ([place]) [A41.9] 03/09/2016  . Urinary retention [R33.9] 03/09/2016  . CAD (coronary artery disease) [I25.10] 03/09/2016  . Frank hematuria [R31.0] 03/08/2016  . Temporary cerebral vascular dysfunction [G93.9] 02/03/2016  . Peripheral vascular disease ([place]) [I73.9] 02/03/2016  . BP (high blood pressure) [I10] 02/03/2016  . HLD (hyperlipidemia) [E78.5] 02/03/2016  . Clinical depression [F32.9] 02/03/2016  . Carotid artery narrowing [I65.29] 02/03/2016  . 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 Malignant neoplastic disease ([place]) [C80.1] 02/03/2016  . Anxiety [F41.9] 02/03/2016  . Abdominal pain, generalized [R10.84]   . Nausea with vomiting [R11.2]   . Gastritis [K29.70]   . Esophageal candidiasis ([place]) [B37.81]   . Noninfectious diarrhea [K52.9]   . Intractable pain [R52] 01/21/2016  . Upper abdominal pain [R10.10]   . Nausea and vomiting in adult [R11.2]   . Ischemic colitis ([place]) [K55.9] 01/01/2016  . Colitis [K52.9] 12/15/2015  . GI bleed [K92.2] 12/15/2015  . Acute inflammation of the pancreas [K85.90] 12/06/2015  . Vitamin B12 deficiency anemia [D51.9] 02/25/2015  . Absolute anemia [D64.9] 02/25/2015  . Carotid  stenosis [I65.29] 02/10/2015  . Personal history of other specified conditions [Z87.898] 11/27/2014  . Osteomyelitis ([place]) [M86.9] 11/27/2014  . H/O coronary artery bypass surgery [Z95.1] 05/18/2014   Total Time spent with patient: 20 minutes  Past Psychiatric History: depression.  Past Medical History:  Past Medical History:  Diagnosis Date  . Anemia   . Anginal pain ([place])   . Anxiety   . 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  This was done at [facility]  . BP (high blood pressure) 02/03/2016  . Cancer ([place])    melanoma skin cancer  . Carotid stenosis 02/10/2015  . Chronic kidney disease    UTI  .  Colitis 12/15/2015  . Collagen vascular disease ([place])   . COPD (chronic obstructive pulmonary disease) ([place])   . Coronary artery disease   . Depression   . Esophageal candidiasis ([place])   . Gastritis   . GERD (gastroesophageal reflux disease)   . GI bleed 12/15/2015  . Headache   . Hypertension   . Ischemic colitis ([place]) 01/01/2016   Overview:   SMA mesenteric ischemia now status post angioplasty by vascular of In stent restenosis.   Overview:  S?P SMA angioplasty   . Myocardial infarction   . Nausea and vomiting in adult   . Noninfectious diarrhea   . Peripheral vascular disease ([place]) 02/03/2016   Overview:  Follows with Dr. Delana Meyer    . Shortness of breath dyspnea    with exertion  . Stroke [facility])    TIA X 2  . Temporary cerebral vascular dysfunction 02/03/2016    Past Surgical History:  Procedure Laterality Date  . ABDOMINAL HYSTERECTOMY    . BACK SURGERY    . BREAST BIOPSY Left 6 2017    results not in yet  . COLONOSCOPY WITH PROPOFOL N/A 01/24/2016   Procedure: COLONOSCOPY WITH PROPOFOL;  Surgeon: Lucilla Lame, MD;  Location: [facility] ENDOSCOPY;  Service: Endoscopy;  Laterality: N/A;  . CORONARY ANGIOPLASTY    . CORONARY ARTERY BYPASS GRAFT    . ESOPHAGOGASTRODUODENOSCOPY (EGD) WITH PROPOFOL N/A 01/24/2016   Procedure: ESOPHAGOGASTRODUODENOSCOPY (EGD) WITH PROPOFOL;  Surgeon: Lucilla Lame, MD;  Location: [facility] ENDOSCOPY;  Service: Endoscopy;  Laterality: N/A;  . KYPHOPLASTY N/A 02/08/2016   Procedure: KYPHOPLASTY L1;  Surgeon: Hessie Knows, MD;  Location: [facility] ORS;  Service: Orthopedics;  Laterality: N/A;  . KYPHOPLASTY N/A 03/28/2016   Procedure: KYPHOPLASTY;  Surgeon: Hessie Knows, MD;  Location: [facility] ORS;  Service: Orthopedics;  Laterality: N/A;  . NOSE SURGERY     cancer removal  . OTHER SURGICAL HISTORY  Jul 11 2014   sternum removal  . PERIPHERAL VASCULAR CATHETERIZATION N/A 02/10/2015   Procedure: Carotid PTA/Stent Intervention;  Surgeon: Katha Cabal, MD;  Location: [place] CV LAB;  Service: Cardiovascular;  Laterality: N/A;  . PERIPHERAL VASCULAR CATHETERIZATION N/A 12/17/2015   Procedure: Visceral Angiography;  Surgeon: Katha Cabal, MD;  Location: [place] CV LAB;  Service: Cardiovascular;  Laterality: N/A;  . PERIPHERAL VASCULAR CATHETERIZATION N/A 12/17/2015   Procedure: Visceral Artery Intervention;  Surgeon: Katha Cabal, MD;  Location: [place] CV LAB;  Service: Cardiovascular;  Laterality: N/A;  . STENT PLACEMENT VASCULAR ([place] HX)     Family History:  Family History  Problem Relation Age of Onset  . Breast cancer Sister   . CAD Father   . CAD Brother    Family Psychiatric  History: see H&P. Social History:  History  Alcohol Use No     History  Drug Use No    Social History   Social History  . Marital status: Married    Spouse name: N/A  . Number of children: N/A  . Years of education: N/A   Social History Main Topics   . Smoking status: Former Smoker    Packs/day: 1.00    Types: Cigarettes    Quit date: 08/01/2012  . Smokeless tobacco: Never Used  . Alcohol use No  . Drug use: No  . Sexual activity: Yes   Other Topics Concern  . None   Social History Narrative  . None   Additional Social History:  Sleep: Fair  Appetite:  Poor  Current Medications: Current Facility-Administered Medications  Medication Dose Route Frequency Provider Last Rate Last Dose  . acetaminophen (TYLENOL) tablet 650 mg  650 mg Oral Q6H PRN Gonzella Lex, MD   650 mg at 10/09/16 0836  . alum & mag hydroxide-simeth (MAALOX/MYLANTA) 200-200-20 MG/5ML suspension 30 mL  30 mL Oral Q4H PRN Gonzella Lex, MD      . amLODipine (NORVASC) tablet 10 mg  10 mg Oral Daily Clovis Fredrickson, MD   10 mg at 10/14/16 0858  . atorvastatin (LIPITOR) tablet 80 mg  80 mg Oral q1800 Gonzella Lex, MD   80 mg at 10/04/16 2100  . clopidogrel (PLAVIX) tablet 75 mg  75 mg Oral Daily Gonzella Lex, MD   75 mg at 10/14/16 0857  . divalproex (DEPAKOTE) DR tablet 500 mg  500 mg Oral Q12H [name], MD   500 mg at 10/14/16 0857  . fluvoxaMINE (LUVOX) tablet 200 mg  200 mg Oral QHS [name], MD   200 mg at 10/13/16 2200  . furosemide (LASIX) tablet 20 mg  20 mg Oral Daily [name], MD   20 mg at 10/14/16 0857  . haloperidol lactate (HALDOL) injection 10 mg  10 mg Intramuscular Daily [name], MD   10 mg at 10/13/16 0857   Or  . haloperidol (HALDOL) tablet 10 mg  10 mg Oral Daily Clovis Fredrickson, MD   10 mg at 10/14/16 0858  . magnesium hydroxide (MILK OF MAGNESIA) suspension 30 mL  30 mL Oral Daily PRN Gonzella Lex, MD      . mometasone-formoterol [facility]) 200-5 MCG/ACT inhaler 2 puff  2 puff Inhalation BID Gonzella Lex, MD   2 puff at 10/14/16 0905  . nitroGLYCERIN (NITROSTAT) SL tablet 0.4 mg  0.4 mg Sublingual Q5 min PRN Gonzella Lex, MD      .  OLANZapine (ZYPREXA) injection 15 mg  15 mg Intramuscular BID PRN Gonzella Lex, MD   15 mg at 10/10/16 2150  . OLANZapine zydis (ZYPREXA) disintegrating tablet 15 mg  15 mg Oral BID AC & HS [name], MD   15 mg at 10/14/16 0857  . potassium chloride SA (K-DUR,KLOR-CON) CR tablet 10 mEq  10 mEq Oral Daily Clovis Fredrickson, MD   10 mEq at 10/14/16 0858  . tamsulosin (FLOMAX) capsule 0.4 mg  0.4 mg Oral Daily [name], MD   0.4 mg at 10/14/16 0857  . traZODone (DESYREL) tablet 50 mg  50 mg Oral QHS Gonzella Lex, MD   50 mg at 10/13/16 2200    Lab Results:  No results found for this or any previous visit (from the past 48 hour(s)).  Blood Alcohol level:  Lab Results  Component Value Date   [facility] <5 10/02/2016   ETH <5 46/96/2952    Metabolic Disorder Labs: Lab Results  Component Value Date   HGBA1C 6.3 (H) 10/10/2016   MPG 134 10/10/2016   No results found for: PROLACTIN Lab Results  Component Value Date   CHOL 134 10/03/2016   TRIG 88 10/03/2016   HDL 53 10/03/2016   CHOLHDL 2.5 10/03/2016   VLDL 18 10/03/2016   LDLCALC 63 10/03/2016    Physical Findings: AIMS:  , ,  ,  ,    CIWA:    COWS:     Musculoskeletal: Strength & Muscle Tone: within normal limits Gait &  Station: normal Patient leans: N/A  Psychiatric Specialty Exam: Physical Exam  Nursing note and vitals reviewed.   Review of Systems  Psychiatric/Behavioral: Positive for hallucinations and suicidal ideas. The patient is nervous/anxious.   All other systems reviewed and are negative.   Blood pressure 100/70, pulse (!) 102, temperature 99 F (37.2 C), temperature source Oral, resp. rate 12, height 5' 1"  (1.549 m), weight 71.2 kg (157 lb), SpO2 98 %.Body mass index is 29.66 kg/m.  General Appearance: Disheveled  Eye Contact:  Good  Speech:  Pressured  Volume:  Increased  Mood:  Anxious  Affect:  Congruent, anxious  Thought Process:  Disorganized and Descriptions of  Associations: Loose  Orientation:  Full (Time, Place, and Person)  Thought Content:  Illogical, Delusions, Paranoid Ideation and Rumination  Suicidal Thoughts:  denies  Homicidal Thoughts:  No  Memory:  Immediate;   Poor Recent;   Poor Remote;   Poor  Judgement:  Poor  Insight:  Lacking  Psychomotor Activity:  Increased  Concentration:  Concentration: Poor  Recall:  Poor  Fund of Knowledge:  Fair  Language:  Fair  Akathisia:  No  Handed:  Right  AIMS (if indicated):     Assets:  Communication Skills Desire for Improvement Financial Resources/Insurance Housing Resilience Social Support  ADL's:  Intact  Cognition:  WNL  Sleep:  Number of Hours: 45     Treatment Plan Summary: Daily contact with patient to assess and evaluate symptoms and progress in treatment and Medication management   Kirsten Castro is a 56 year old female with history of depression admitted for worsening of her symptoms, paranoia and suicidal threats in the context of treatment noncompliance.  *   Agitation. Haldol, Ativan and Benadryl are available per forced medication order.  1. Suicidal ideation. The patient is able to contract for safety in the hospital.  2. Mood, anxiety and psychosis. We started Zyprexa and Haldol for psychosis, Depakote for mood stabilization and Luvox for depression, anxiety, and trichotillomania. She is on forced medications now and refuses oral  meds. VPA level 20, ammonia low on 10/10/2016.   3. Hypertension. She is on lisinopril, furosemide, and potassium. Blood pressure is normal.   4. Coronary artery disease. She is on Lipitor and nitroglycerin. We will order EKG when the patient is calmer.  5. GERD. She is on Protonix.  6. COPD. She is on [place].  7. Urinary retention. She is on Flomax.  8. Insomnia. Trazodone is available.  9. Metabolic syndrome monitoring. Lipid panel and TSH are normal. Hemoglobin A1c 6.3.    10. B12 deficiency. B12 level is low. We  tried B12 injections but the patient refuses.  11. Disposition. She will be discharged to home with her husband. She will follow up with mental health professionals.  Alethia Berthold, MD 10/14/2016, 3:36 PM   Patient ID: Kirsten Castro, female   DOB: [birth year], 56 y.o.   MRN: [id number] Patient ID: Kirsten Castro, female   DOB: 1960-12-22, 56 y.o.   MRN: [id number]  Blood pressure 100/70, pulse (!) 102, temperature 99 F (37.2 C), temperature source Oral, resp. rate 12, height 5' 1"  (1.549 m), weight 71.2 kg (157 lb), SpO2 98 %.Body mass index is 29.66 kg/m.  General Appearance: Disheveled  Eye Contact:  Good  Speech:  Pressured  Volume:  Increased  Mood:  Anxious  Affect:  Congruent, anxious  Thought Process:  Disorganized and Descriptions of Associations: Loose  Orientation:  Full (Time, Place, and Person)  Thought Content:  Illogical, Delusions, Paranoid Ideation and Rumination  Suicidal Thoughts:  denies  Homicidal Thoughts:  No  Memory:  Immediate;   Poor Recent;   Poor Remote;   Poor  Judgement:  Poor  Insight:  Lacking  Psychomotor Activity:  Increased  Concentration:  Concentration: Poor  Recall:  Poor  Fund of Knowledge:  Fair  Language:  Fair  Akathisia:  No  Handed:  Right  AIMS (if indicated):     Assets:  Communication Skills Desire for Improvement Financial Resources/Insurance Housing Resilience Social Support  ADL's:  Intact  Cognition:  WNL  Sleep:  Number of Hours: 45     Treatment Plan Summary: Daily contact with patient to assess and evaluate symptoms and progress in treatment and Medication management   Kirsten. Laurel is a 57 year old female with history of depression admitted for worsening of her symptoms, paranoia and suicidal threats in the context of treatment noncompliance.  *   Agitation. Haldol, Ativan and Benadryl are available per forced medication order.  1. Suicidal ideation. The patient is able to contract for safety in the  hospital.  2. Mood, anxiety and psychosis. We started Zyprexa and Haldol for psychosis, Depakote for mood stabilization and Luvox for depression, anxiety, and trichotillomania. She is on forced medications now and refuses oral  meds. VPA level 20, ammonia low on 10/10/2016.   3. Hypertension. She is on lisinopril, furosemide, and potassium. Blood pressure is normal.   4. Coronary artery disease. She is on Lipitor and nitroglycerin. We will order EKG when the patient is calmer.  5. GERD. She is on Protonix.  6. COPD. She is on [place].  7. Urinary retention. She is on Flomax.  8. Insomnia. Trazodone is available.  9. Metabolic syndrome monitoring. Lipid panel and TSH are normal. Hemoglobin A1c 6.3.    10. B12 deficiency. B12 level is low. We tried B12 injections but the patient refuses.  11. Disposition. She will be discharged to home with her husband. She will follow up with mental health professionals. Patient continues to be on forced medication but is allowing injections normally. She is odd and confused still. No change to current treatment plan.  I certify that the services received since the previous certification/recertification were and continue to be medically necessary as the treatment provided can be reasonably expected to improve the patient's condition; the medical record documents that the services furnished were intensive treatment services or their equivalent services, and this patient continues to need, on a daily basis, active treatment furnished directly by or requiring the supervision of inpatient psychiatric personnel.   Alethia Berthold, MD 10/14/2016, 3:31 PM   Patient ID: Kirsten Castro, female   DOB: [birth year], 56 y.o.   MRN: [id number] Patient ID: Kirsten Castro, female   DOB: 03/19/1961, 56 y.o.   MRN: [id number]

## 2016-10-14 NOTE — Progress Notes (Signed)
Pt cooperative with medication administration this morning. Took all her scheduled PO medications. She does report that she feels the medications get stuck in her throat when she swallows them. Pt noted to chew her medications before swallowing. Safety maintained. Will continue to monitor.

## 2016-10-14 NOTE — [facility] Group Notes (Signed)
[facility] LCSW Group Therapy  10/14/2016 1pm  Type of Therapy:  Group Therapy  Participation Level:  Active  Participation Quality:  Appropriate, Sharing and Supportive  Affect:  Appropriate  Cognitive:  Oriented  Insight:  Developing/Improving  Engagement in Therapy:  Engaged  Modes of Intervention:  Clarification, Confrontation, Discussion, Exploration, Socialization and Support  Summary of Progress The topic for today was feelings related to overcoming obstacles. Patient were asked to introduce himself or herself and reflect an obstacle they face. In group peers supported  each other and shared ideas on how to resolve these obstacles. This patient was polite and calm in group however she had issues reflecting her thoughts and feelings, her peer group and LCSW was supportive when she did talk. Pauletta Browns, Waris Rodger M LCSW 10/14/2016, 4pm

## 2016-10-14 NOTE — [facility] Group Notes (Signed)
[facility] Group Notes:  (Nursing/MHT/Case Management/Adjunct)  Date:  10/14/2016  Time:  5:36 PM  Type of Therapy:  Music Therapy  Participation Level:  Active  Participation Quality:  Appropriate  Affect:  Appropriate  Cognitive:  Appropriate  Insight:  Good  Engagement in Group:  Engaged  Modes of Intervention:  Activity, Discussion and Support  Summary of Progress/Problems:  Coralie Common 10/14/2016, 5:36 PM

## 2016-10-14 NOTE — Progress Notes (Signed)
Pt awake, alert, oriented and up on unit today. Improvement noted. Smiles, brightens on approach. Observed in hallway looking into nurse's station, smiling. Completed her self inventory independently reporting good sleep last night with the help of sleep medication. Reports good appetite, normal energy, good concentration. Rates depression 10/10, anxiety 10/10, hopelessness 10/10 (low 0-10 high).Cooperative and compliant today. No evidence of responding to internal stimuli, grunting or growling. Pt has not made reference to "water on my body" today. Medication complaint.   Support and encouragement provided with use of therapeutic communication. Medications administered as ordered with education. Safety maintained with every 15 minute checks. Will continue to monitor.

## 2016-10-15 NOTE — Progress Notes (Signed)
Patient was very hostile, irritable when asked to come for her evening Lipitor. She became paranoid stating that we were trying to poison her. She started cursing and was very delusional. She was offered to take the medication with applesauce or coke(which she likes) but she refused. She eventually took the Lipitor after three attempts/prompts.

## 2016-10-15 NOTE — Progress Notes (Signed)
D: Pt denies SI/HI/AVH. Pt is irritable, verbally aggressive, suspicious ad unwilling to participate in treatment plan.Patient's affect is blunted, thoughts are disorganized at times. Pt appears anxious and restless c/o chest discomfort VS checked BP slightly  elevated nitroglycerin was given per S.O , and patient reported relief after three attempts. Patient denies pain radiating to any upper extremity, no sweating noted or facial weakness. Patient is not interacting with peers and staff appropriately.  A: Pt was offered support and encouragement. Pt was given scheduled medications. Pt was encouraged to attend groups. Q 15 minute checks was done for safety.  R:Pt  Did not attend group. Pt is taking medication. Pt is not receptive to treatment. Safety maintained on unit will continue to closely monitor.

## 2016-10-15 NOTE — [facility] Group Notes (Signed)
[facility] Group Notes:  (Nursing/MHT/Case Management/Adjunct)  Date:  10/15/2016  Time:  4:57 AM  Type of Therapy:  Psychoeducational Skills  Participation Level:  Did Not Attend  Summary of Progress/Problems:  Reece Agar 10/15/2016, 4:57 AM

## 2016-10-15 NOTE — Progress Notes (Signed)
Patient was very irritable, hostile and verbally aggressive to staff and had to be verbally redirected. During medication administration, patient was very uncooperative. She initially had refused to take her medications but eventually did after much prompting and encouragement from different staff. At about 0930, patient observed sleeping in bed, chest rise and fall noted. She did not attend groups. Refused any teaching and remained hostile. No Falls. Will continue to monitor.

## 2016-10-15 NOTE — Plan of Care (Signed)
Problem: Safety: Goal: Ability to remain free from injury will improve Outcome: Progressing No Falls noted thus far.

## 2016-10-15 NOTE — Progress Notes (Signed)
[facility] MD Progress Note  10/15/2016 2:24 PM DESHANDA MOLITOR  MRN:  [id number]  Subjective:   [name] has a history of depression, possibly bipolar disorder. She is floridly psychotic and refused medications twice in the past 24 hours. FORCED MEDICATION order since 10/04/2016.  10/08/2016 Pt seen, chart reviewed, discussed with nursing staff. Pt is reluctant to take med, but taking, had breakfast in am. States mood is " so so ", no ne w complaints. on FMP. Pt still psychotic, delusional, paranoid as before. Pt states she is anxious, and nothing will help, states she hears voices as before. Denies SI/HI.   10/09/2016. Ms Kirsten Castro remains uncooperative and rude. She is on forced medication order. She composes taking any medications by mouth or by injection with an argument that she has not been eating any food here, therefore she should not be given any medications. She still wants me to check her body for "fluids". SW spoke with the husband who believes that all her problems started following bypass surgery with complications leading to confusion and behavioral changes. We will continue forced medication orders as the patient is still psychotic and unable to make educated decision about her treatment.  10/10/2016. [name] met with the treatment team for the first time today. She is very anxious appearing, making strange noises, unable to sit still, fidgety, and slightly agitated. She however was able to have a brief conversation with her treatment team. She was telling us again that she is unable to take any medications and should not be given any because she is "full of water and doesn't eat anything here". She did take her medications today with encouragement. She does better when we offered her Coca-Cola to drink. She also signed her name on the form indicating that she participated in the meeting with no hesitation. She agreed to draw labs trhis morning. Unfortunately VPA level is only 20 as she  refused most of her doses. The patient is unconvinced that medications are helpful. Moreover she believes that it's "too late" now for treatment. This statement suggests a diagnosis of depression with psychotic features.  10/11/2016. [name] appears slightly better today. She ate half of her breakfast and lunch. She properly introduced herself to a staff member this morning. On direkt questioning, she tells me that she is feeling "no good". She refused medications even with encouragement and a bribe with Coca cola. She complains of being full of water, unable to drink, or eat, unable to urinate. Indeed, she has a history of urinary retention. Per nursing, she wears a diaper but in spite of her complaints of "water coming from the front and the back", her diaper is frequently dry. She refuses Flomax, even though she knows why this was prescribed. She refuses blood pressure medications but her blood pressure has not been elevated. She adamantly refuses to see a Conservator, museum/gallery. I will offer it to her tomorrow again.  10/12/2016. There is no change. She did take some medications this morning but refused most of them. We continue Haldol, Zyprexa and Depakote for psychosis and mood stabilization. She refuses to see medical doctor.  10/13/2016. For the first time today [name] told me that she is doing "so-so". She then immediately preceded to her regular amount about being full of water and not able to take medications. She admitted that she ate half of the breakfast today. She refuses oral medications most of the time but accepts Haldol and Zyprexa by injection. She has been  missing her Plavix, antihypertensives, and Flomax. Blood pressure is stable. Heart rate is slightly elevated.  Follow-up for the 13th. Patient seen. She seems a little confused. Didn't know where she was briefly. Still seems psychotic and agitated and refusing medication. Stays mostly isolated from others on the unit. No specific  new physical complaints  Follow-up the 14th. Patient is still talking about how she has to much water inside her body. She is eating however. Grooming herself better. Denies suicidal thoughts. Still has poor insight and appears very slow in her thinking.  Per nursing; D: Pt denies SI/HI/AVH but noted responding to internal stimuli. Patient's affect is flat, thoughts are disorganized.  Pt appears anxious, irritable and verbally aggressive and at times using profanities towards staff. Patient was advised to stop causing at staff. Patient is not interacting with peers and staff appropriately.  A: Pt was offered support and encouragement. Pt was given scheduled medications. Pt was encouraged to attend groups. Q 15 minute checks were done for safety.  R:Pt did not attend group. Pt is complaint with medication. Pt is not receptive to treatment. 15 minutes checks maintained on unit, will continue to monitor. .  Principal Problem: Severe recurrent major depression with psychotic features ([place]) Diagnosis:   Patient Active Problem List   Diagnosis Date Noted  . Severe recurrent major depression with psychotic features ([place]) [F33.3] 10/02/2016  . Benzodiazepine withdrawal ([place]) [F13.239] 10/02/2016  . 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 Abdominal pain [R10.9] 04/19/2016  . Sepsis ([place]) [A41.9] 03/09/2016  . Urinary retention [R33.9] 03/09/2016  . CAD (coronary artery disease) [I25.10] 03/09/2016  . Frank hematuria [R31.0] 03/08/2016  . Temporary cerebral vascular dysfunction [G93.9] 02/03/2016  . Peripheral vascular disease ([place]) [I73.9] 02/03/2016  . BP (high blood pressure) [I10] 02/03/2016  . HLD (hyperlipidemia) [E78.5] 02/03/2016  . Clinical depression [F32.9] 02/03/2016  . Carotid artery narrowing [I65.29] 02/03/2016  . 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 Malignant neoplastic disease ([place]) [C80.1] 02/03/2016  . Anxiety [F41.9] 02/03/2016  .  Abdominal pain, generalized [R10.84]   . Nausea with vomiting [R11.2]   . Gastritis [K29.70]   . Esophageal candidiasis ([place]) [B37.81]   . Noninfectious diarrhea [K52.9]   . Intractable pain [R52] 01/21/2016  . Upper abdominal pain [R10.10]   . Nausea and vomiting in adult [R11.2]   . Ischemic colitis ([place]) [K55.9] 01/01/2016  . Colitis [K52.9] 12/15/2015  . GI bleed [K92.2] 12/15/2015  . Acute inflammation of the pancreas [K85.90] 12/06/2015  . Vitamin B12 deficiency anemia [D51.9] 02/25/2015  . Absolute anemia [D64.9] 02/25/2015  . Carotid stenosis [I65.29] 02/10/2015  . Personal history of other specified conditions [Z87.898] 11/27/2014  . Osteomyelitis ([place]) [M86.9] 11/27/2014  . H/O coronary artery bypass surgery [Z95.1] 05/18/2014   Total Time spent with patient: 20 minutes  Past Psychiatric History: depression.  Past Medical History:  Past Medical History:  Diagnosis Date  . Anemia   . Anginal pain ([place])   . Anxiety   . 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  This was done at [facility]  .  BP (high blood pressure) 02/03/2016  . Cancer ([place])    melanoma skin cancer  . Carotid stenosis 02/10/2015  . Chronic kidney disease    UTI  . Colitis 12/15/2015  . Collagen vascular disease ([place])   . COPD (chronic obstructive pulmonary disease) ([place])   . Coronary artery disease   . Depression   . Esophageal candidiasis ([place])   . Gastritis   . GERD (gastroesophageal reflux disease)   . GI bleed 12/15/2015  . Headache   . Hypertension   . Ischemic colitis ([place]) 01/01/2016   Overview:   SMA  mesenteric ischemia now status post angioplasty by vascular of In stent restenosis.   Overview:  S?P SMA angioplasty   . Myocardial infarction   . Nausea and vomiting in adult   . Noninfectious diarrhea   . Peripheral vascular disease ([place]) 02/03/2016   Overview:  Follows with Dr. Delana Meyer    . Shortness of breath dyspnea    with exertion  . Stroke [facility])    TIA X 2  . Temporary cerebral vascular dysfunction 02/03/2016    Past Surgical History:  Procedure Laterality Date  . ABDOMINAL HYSTERECTOMY    . BACK SURGERY    . BREAST BIOPSY Left 6 2017   results not in yet  . COLONOSCOPY WITH PROPOFOL N/A 01/24/2016   Procedure: COLONOSCOPY WITH PROPOFOL;  Surgeon: Lucilla Lame, MD;  Location: [facility] ENDOSCOPY;  Service: Endoscopy;  Laterality: N/A;  . CORONARY ANGIOPLASTY    . CORONARY ARTERY BYPASS GRAFT    . ESOPHAGOGASTRODUODENOSCOPY (EGD) WITH PROPOFOL N/A 01/24/2016   Procedure: ESOPHAGOGASTRODUODENOSCOPY (EGD) WITH PROPOFOL;  Surgeon: Lucilla Lame, MD;  Location: [facility] ENDOSCOPY;  Service: Endoscopy;  Laterality: N/A;  . KYPHOPLASTY N/A 02/08/2016   Procedure: KYPHOPLASTY L1;  Surgeon: Hessie Knows, MD;  Location: [facility] ORS;  Service: Orthopedics;  Laterality: N/A;  . KYPHOPLASTY N/A 03/28/2016   Procedure: KYPHOPLASTY;  Surgeon: Hessie Knows, MD;  Location: [facility] ORS;  Service: Orthopedics;  Laterality: N/A;  . NOSE SURGERY     cancer removal  . OTHER SURGICAL HISTORY  Jul 11 2014   sternum removal  . PERIPHERAL VASCULAR CATHETERIZATION N/A 02/10/2015   Procedure: Carotid PTA/Stent Intervention;  Surgeon: Katha Cabal, MD;  Location: [place] CV LAB;  Service: Cardiovascular;  Laterality: N/A;  . PERIPHERAL VASCULAR CATHETERIZATION N/A 12/17/2015   Procedure: Visceral Angiography;  Surgeon: Katha Cabal, MD;  Location: [place] CV LAB;  Service: Cardiovascular;  Laterality: N/A;  . PERIPHERAL VASCULAR CATHETERIZATION N/A 12/17/2015   Procedure: Visceral Artery Intervention;   Surgeon: Katha Cabal, MD;  Location: [place] CV LAB;  Service: Cardiovascular;  Laterality: N/A;  . STENT PLACEMENT VASCULAR ([place] HX)     Family History:  Family History  Problem Relation Age of Onset  . Breast cancer Sister   . CAD Father   . CAD Brother    Family Psychiatric  History: see H&P. Social History:  History  Alcohol Use No     History  Drug Use No    Social History   Social History  . Marital status: Married    Spouse name: N/A  . Number of children: N/A  . Years of education: N/A   Social History Main Topics  . Smoking status: Former Smoker    Packs/day: 1.00    Types: Cigarettes    Quit date: 08/01/2012  . Smokeless tobacco: Never Used  . Alcohol use No  . Drug use: No  . Sexual activity:  Yes   Other Topics Concern  . None   Social History Narrative  . None   Additional Social History:                         Sleep: Fair  Appetite:  Poor  Current Medications: Current Facility-Administered Medications  Medication Dose Route Frequency Provider Last Rate Last Dose  . acetaminophen (TYLENOL) tablet 650 mg  650 mg Oral Q6H PRN Gonzella Lex, MD   650 mg at 10/14/16 2046  . alum & mag hydroxide-simeth (MAALOX/MYLANTA) 200-200-20 MG/5ML suspension 30 mL  30 mL Oral Q4H PRN Gonzella Lex, MD      . amLODipine (NORVASC) tablet 10 mg  10 mg Oral Daily [name], MD   10 mg at 10/15/16 0800  . atorvastatin (LIPITOR) tablet 80 mg  80 mg Oral q1800 Gonzella Lex, MD   80 mg at 10/14/16 1807  . clopidogrel (PLAVIX) tablet 75 mg  75 mg Oral Daily Gonzella Lex, MD   75 mg at 10/15/16 0800  . divalproex (DEPAKOTE) DR tablet 500 mg  500 mg Oral Q12H [name], MD   500 mg at 10/15/16 0800  . fluvoxaMINE (LUVOX) tablet 200 mg  200 mg Oral QHS [name], MD   200 mg at 10/14/16 2046  . furosemide (LASIX) tablet 20 mg  20 mg Oral Daily [name], MD   20 mg at 10/15/16 0800  . haloperidol  lactate (HALDOL) injection 10 mg  10 mg Intramuscular Daily [name], MD   10 mg at 10/13/16 0857   Or  . haloperidol (HALDOL) tablet 10 mg  10 mg Oral Daily [name], MD   10 mg at 10/15/16 0800  . magnesium hydroxide (MILK OF MAGNESIA) suspension 30 mL  30 mL Oral Daily PRN Gonzella Lex, MD      . mometasone-formoterol [facility]) 200-5 MCG/ACT inhaler 2 puff  2 puff Inhalation BID Gonzella Lex, MD   2 puff at 10/15/16 0848  . nitroGLYCERIN (NITROSTAT) SL tablet 0.4 mg  0.4 mg Sublingual Q5 min PRN Gonzella Lex, MD   0.4 mg at 10/14/16 2118  . OLANZapine (ZYPREXA) injection 15 mg  15 mg Intramuscular BID PRN Gonzella Lex, MD   15 mg at 10/10/16 2150  . OLANZapine zydis (ZYPREXA) disintegrating tablet 15 mg  15 mg Oral BID AC & HS [name], MD   15 mg at 10/15/16 0800  . potassium chloride SA (K-DUR,KLOR-CON) CR tablet 10 mEq  10 mEq Oral Daily [name], MD   10 mEq at 10/15/16 0800  . tamsulosin (FLOMAX) capsule 0.4 mg  0.4 mg Oral Daily [name], MD   0.4 mg at 10/15/16 0800  . traZODone (DESYREL) tablet 50 mg  50 mg Oral QHS Gonzella Lex, MD   50 mg at 10/14/16 2047    Lab Results:  No results found for this or any previous visit (from the past 48 hour(s)).  Blood Alcohol level:  Lab Results  Component Value Date   [facility] <5 10/02/2016   ETH <5 77/82/4235    Metabolic Disorder Labs: Lab Results  Component Value Date   HGBA1C 6.3 (H) 10/10/2016   MPG 134 10/10/2016   No results found for: PROLACTIN Lab Results  Component Value Date   CHOL 134 10/03/2016   TRIG 88 10/03/2016   HDL 53 10/03/2016  CHOLHDL 2.5 10/03/2016   VLDL 18 10/03/2016   LDLCALC 63 10/03/2016    Physical Findings: AIMS:  , ,  ,  ,    CIWA:    COWS:     Musculoskeletal: Strength & Muscle Tone: within normal limits Gait & Station: normal Patient leans: N/A  Psychiatric Specialty Exam: Physical Exam  Nursing note and vitals  reviewed.   Review of Systems  Psychiatric/Behavioral: Positive for hallucinations and suicidal ideas. The patient is nervous/anxious.   All other systems reviewed and are negative.  [facility] MD Progress Note  10/15/2016 2:24 PM SAPHRONIA OZDEMIR  MRN:  [id number]  Subjective:   [name] has a history of depression, possibly bipolar disorder. She is floridly psychotic and refused medications twice in the past 24 hours. FORCED MEDICATION order since 10/04/2016.  10/08/2016 Pt seen, chart reviewed, discussed with nursing staff. Pt is reluctant to take med, but taking, had breakfast in am. States mood is " so so ", no ne w complaints. on FMP. Pt still psychotic, delusional, paranoid as before. Pt states she is anxious, and nothing will help, states she hears voices as before. Denies SI/HI.   10/09/2016. Ms Butrick remains uncooperative and rude. She is on forced medication order. She composes taking any medications by mouth or by injection with an argument that she has not been eating any food here, therefore she should not be given any medications. She still wants me to check her body for "fluids". SW spoke with the husband who believes that all her problems started following bypass surgery with complications leading to confusion and behavioral changes. We will continue forced medication orders as the patient is still psychotic and unable to make educated decision about her treatment.  10/10/2016. [name] met with the treatment team for the first time today. She is very anxious appearing, making strange noises, unable to sit still, fidgety, and slightly agitated. She however was able to have a brief conversation with her treatment team. She was telling us again that she is unable to take any medications and should not be given any because she is "full of water and doesn't eat anything here". She did take her medications today with encouragement. She does better when we offered her Coca-Cola to drink.  She also signed her name on the form indicating that she participated in the meeting with no hesitation. She agreed to draw labs trhis morning. Unfortunately VPA level is only 20 as she refused most of her doses. The patient is unconvinced that medications are helpful. Moreover she believes that it's "too late" now for treatment. This statement suggests a diagnosis of depression with psychotic features.  10/11/2016. [name] appears slightly better today. She ate half of her breakfast and lunch. She properly introduced herself to a staff member this morning. On direkt questioning, she tells me that she is feeling "no good". She refused medications even with encouragement and a bribe with Coca cola. She complains of being full of water, unable to drink, or eat, unable to urinate. Indeed, she has a history of urinary retention. Per nursing, she wears a diaper but in spite of her complaints of "water coming from the front and the back", her diaper is frequently dry. She refuses Flomax, even though she knows why this was prescribed. She refuses blood pressure medications but her blood pressure has not been elevated. She adamantly refuses to see a Conservator, museum/gallery. I will offer it to her tomorrow again.  10/12/2016. There is no  change. She did take some medications this morning but refused most of them. We continue Haldol, Zyprexa and Depakote for psychosis and mood stabilization. She refuses to see medical doctor.  10/13/2016. For the first time today [name] told me that she is doing "so-so". She then immediately preceded to her regular amount about being full of water and not able to take medications. She admitted that she ate half of the breakfast today. She refuses oral medications most of the time but accepts Haldol and Zyprexa by injection. She has been missing her Plavix, antihypertensives, and Flomax. Blood pressure is stable. Heart rate is slightly elevated.  Per nursing; D: Pt denies SI/HI/AVH  but noted responding to internal stimuli. Patient's affect is flat, thoughts are disorganized.  Pt appears anxious, irritable and verbally aggressive and at times using profanities towards staff. Patient was advised to stop causing at staff. Patient is not interacting with peers and staff appropriately.  A: Pt was offered support and encouragement. Pt was given scheduled medications. Pt was encouraged to attend groups. Q 15 minute checks were done for safety.  R:Pt did not attend group. Pt is complaint with medication. Pt is not receptive to treatment. 15 minutes checks maintained on unit, will continue to monitor. .  Principal Problem: Severe recurrent major depression with psychotic features ([place]) Diagnosis:   Patient Active Problem List   Diagnosis Date Noted  . Severe recurrent major depression with psychotic features ([place]) [F33.3] 10/02/2016  . Benzodiazepine withdrawal ([place]) [F13.239] 10/02/2016  . 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 Abdominal pain [R10.9] 04/19/2016  . Sepsis ([place]) [A41.9] 03/09/2016  . Urinary retention [R33.9] 03/09/2016  . CAD (coronary artery disease) [I25.10] 03/09/2016  . Frank hematuria [R31.0] 03/08/2016  . Temporary cerebral vascular dysfunction [G93.9] 02/03/2016  . Peripheral vascular disease ([place]) [I73.9] 02/03/2016  . BP (high blood pressure) [I10] 02/03/2016  . HLD (hyperlipidemia) [E78.5] 02/03/2016  . Clinical depression [F32.9] 02/03/2016  . Carotid artery narrowing [I65.29] 02/03/2016  . 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 Malignant neoplastic disease ([place]) [C80.1] 02/03/2016  . Anxiety [F41.9] 02/03/2016  . Abdominal pain, generalized [R10.84]   . Nausea with vomiting [R11.2]   . Gastritis [K29.70]   . Esophageal candidiasis ([place]) [B37.81]   . Noninfectious diarrhea [K52.9]   . Intractable pain [R52] 01/21/2016  . Upper abdominal pain [R10.10]   . Nausea and vomiting in adult [R11.2]   . Ischemic  colitis ([place]) [K55.9] 01/01/2016  . Colitis [K52.9] 12/15/2015  . GI bleed [K92.2] 12/15/2015  . Acute inflammation of the pancreas [K85.90] 12/06/2015  . Vitamin B12 deficiency anemia [D51.9] 02/25/2015  . Absolute anemia [D64.9] 02/25/2015  . Carotid stenosis [I65.29] 02/10/2015  . Personal history of other specified conditions [Z87.898] 11/27/2014  . Osteomyelitis ([place]) [M86.9] 11/27/2014  . H/O coronary artery bypass surgery [Z95.1] 05/18/2014   Total Time spent with patient: 20 minutes  Past Psychiatric History: depression.  Past Medical History:  Past Medical History:  Diagnosis Date  . Anemia   . Anginal pain ([place])   . Anxiety   . 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  This was done at [facility]  . BP (high blood pressure) 02/03/2016  . Cancer ([place])    melanoma skin cancer  . Carotid stenosis 02/10/2015  . Chronic kidney disease    UTI  . Colitis 12/15/2015  . Collagen vascular disease ([place])   . COPD (chronic obstructive pulmonary disease) ([place])   . Coronary artery disease   . Depression   . Esophageal candidiasis ([place])   . Gastritis   . GERD (gastroesophageal reflux disease)   . GI bleed 12/15/2015  . Headache   . Hypertension   . Ischemic colitis ([place]) 01/01/2016   Overview:   SMA mesenteric ischemia now status post angioplasty by vascular of In stent restenosis.   Overview:  S?P SMA angioplasty   . Myocardial infarction   . Nausea and vomiting in adult   . Noninfectious diarrhea   . Peripheral vascular disease ([place]) 02/03/2016   Overview:  Follows with Dr. Delana Meyer    . Shortness  of breath dyspnea    with exertion  . Stroke [facility])    TIA X 2  . Temporary cerebral vascular dysfunction 02/03/2016    Past Surgical History:  Procedure Laterality Date  . ABDOMINAL HYSTERECTOMY    . BACK SURGERY    . BREAST BIOPSY Left 6 2017   results not in yet  . COLONOSCOPY WITH PROPOFOL N/A 01/24/2016   Procedure: COLONOSCOPY WITH PROPOFOL;  Surgeon: Lucilla Lame, MD;  Location: [facility] ENDOSCOPY;  Service: Endoscopy;  Laterality: N/A;  . CORONARY ANGIOPLASTY    . CORONARY ARTERY BYPASS GRAFT    . ESOPHAGOGASTRODUODENOSCOPY (EGD) WITH PROPOFOL N/A 01/24/2016   Procedure: ESOPHAGOGASTRODUODENOSCOPY (EGD) WITH PROPOFOL;  Surgeon: Lucilla Lame, MD;  Location: [facility] ENDOSCOPY;  Service: Endoscopy;  Laterality: N/A;  . KYPHOPLASTY N/A 02/08/2016   Procedure: KYPHOPLASTY L1;  Surgeon: Hessie Knows, MD;  Location: [facility] ORS;  Service: Orthopedics;  Laterality: N/A;  . KYPHOPLASTY N/A 03/28/2016   Procedure: KYPHOPLASTY;  Surgeon: Hessie Knows, MD;  Location: [facility] ORS;  Service: Orthopedics;  Laterality: N/A;  . NOSE SURGERY     cancer removal  . OTHER SURGICAL HISTORY  Jul 11 2014   sternum removal  . PERIPHERAL VASCULAR CATHETERIZATION N/A 02/10/2015   Procedure: Carotid PTA/Stent Intervention;  Surgeon: Katha Cabal, MD;  Location: [place] CV LAB;  Service: Cardiovascular;  Laterality: N/A;  . PERIPHERAL VASCULAR CATHETERIZATION N/A 12/17/2015   Procedure: Visceral Angiography;  Surgeon: Katha Cabal, MD;  Location: [place] CV LAB;  Service: Cardiovascular;  Laterality: N/A;  . PERIPHERAL VASCULAR CATHETERIZATION N/A 12/17/2015   Procedure: Visceral Artery Intervention;  Surgeon: Katha Cabal, MD;  Location: [place] CV LAB;  Service: Cardiovascular;  Laterality: N/A;  . STENT PLACEMENT VASCULAR ([place] HX)     Family History:  Family History  Problem Relation Age of Onset  . Breast cancer Sister   . CAD Father   . CAD Brother    Family Psychiatric  History: see  H&P. Social History:  History  Alcohol Use No     History  Drug Use No    Social History   Social History  . Marital status: Married    Spouse name: N/A  . Number of children: N/A  . Years of education: N/A   Social History Main Topics  . Smoking status: Former Smoker    Packs/day: 1.00    Types: Cigarettes    Quit date: 08/01/2012  . Smokeless  tobacco: Never Used  . Alcohol use No  . Drug use: No  . Sexual activity: Yes   Other Topics Concern  . None   Social History Narrative  . None   Additional Social History:                         Sleep: Fair  Appetite:  Poor  Current Medications: Current Facility-Administered Medications  Medication Dose Route Frequency Provider Last Rate Last Dose  . acetaminophen (TYLENOL) tablet 650 mg  650 mg Oral Q6H PRN Gonzella Lex, MD   650 mg at 10/14/16 2046  . alum & mag hydroxide-simeth (MAALOX/MYLANTA) 200-200-20 MG/5ML suspension 30 mL  30 mL Oral Q4H PRN Gonzella Lex, MD      . amLODipine (NORVASC) tablet 10 mg  10 mg Oral Daily [name], MD   10 mg at 10/15/16 0800  . atorvastatin (LIPITOR) tablet 80 mg  80 mg Oral q1800 Gonzella Lex, MD   80 mg at 10/14/16 1807  . clopidogrel (PLAVIX) tablet 75 mg  75 mg Oral Daily Gonzella Lex, MD   75 mg at 10/15/16 0800  . divalproex (DEPAKOTE) DR tablet 500 mg  500 mg Oral Q12H [name], MD   500 mg at 10/15/16 0800  . fluvoxaMINE (LUVOX) tablet 200 mg  200 mg Oral QHS [name], MD   200 mg at 10/14/16 2046  . furosemide (LASIX) tablet 20 mg  20 mg Oral Daily [name], MD   20 mg at 10/15/16 0800  . haloperidol lactate (HALDOL) injection 10 mg  10 mg Intramuscular Daily [name], MD   10 mg at 10/13/16 0857   Or  . haloperidol (HALDOL) tablet 10 mg  10 mg Oral Daily [name], MD   10 mg at 10/15/16 0800  . magnesium hydroxide (MILK OF MAGNESIA) suspension 30 mL  30 mL Oral Daily PRN Gonzella Lex, MD      . mometasone-formoterol [facility]) 200-5 MCG/ACT inhaler 2 puff  2 puff Inhalation BID Gonzella Lex, MD   2 puff at 10/15/16 0848  . nitroGLYCERIN (NITROSTAT) SL tablet 0.4 mg  0.4 mg Sublingual Q5 min PRN Gonzella Lex, MD   0.4 mg at 10/14/16 2118  . OLANZapine (ZYPREXA) injection 15 mg  15 mg Intramuscular BID PRN Gonzella Lex, MD   15 mg at 10/10/16 2150  . OLANZapine zydis (ZYPREXA) disintegrating tablet 15 mg  15 mg Oral BID AC & HS [name], MD   15 mg at 10/15/16 0800  . potassium chloride SA (K-DUR,KLOR-CON) CR tablet 10 mEq  10 mEq Oral Daily [name], MD   10 mEq at 10/15/16 0800  . tamsulosin (FLOMAX) capsule 0.4 mg  0.4 mg Oral Daily [name], MD   0.4 mg at 10/15/16 0800  . traZODone (DESYREL) tablet 50 mg  50 mg Oral QHS Gonzella Lex, MD   50 mg at 10/14/16 2047    Lab Results:  No results found for this or any previous visit (from the past 48 hour(s)).  Blood Alcohol level:  Lab Results  Component Value Date   [facility] <5 10/02/2016   ETH <5 19/41/7408    Metabolic Disorder Labs: Lab Results  Component Value Date   HGBA1C 6.3 (H) 10/10/2016   MPG 134 10/10/2016   No results found for: PROLACTIN Lab Results  Component Value  Date   CHOL 134 10/03/2016   TRIG 88 10/03/2016   HDL 53 10/03/2016   CHOLHDL 2.5 10/03/2016   VLDL 18 10/03/2016   LDLCALC 63 10/03/2016    Physical Findings: AIMS:  , ,  ,  ,    CIWA:    COWS:     Musculoskeletal: Strength & Muscle Tone: within normal limits Gait & Station: normal Patient leans: N/A  Psychiatric Specialty Exam: Physical Exam  Nursing note and vitals reviewed.   Review of Systems  Psychiatric/Behavioral: Positive for hallucinations and suicidal ideas. The patient is nervous/anxious.   All other systems reviewed and are negative.   Blood pressure 117/83, pulse 95, temperature 98.6 F (37 C), temperature source Oral, resp. rate 18, height 5' 1"  (1.549 m),  weight 71.2 kg (157 lb), SpO2 98 %.Body mass index is 29.66 kg/m.  General Appearance: Disheveled  Eye Contact:  Good  Speech:  Pressured  Volume:  Increased  Mood:  Anxious  Affect:  Congruent, anxious  Thought Process:  Disorganized and Descriptions of Associations: Loose  Orientation:  Full (Time, Place, and Person)  Thought Content:  Illogical, Delusions, Paranoid Ideation and Rumination  Suicidal Thoughts:  denies  Homicidal Thoughts:  No  Memory:  Immediate;   Poor Recent;   Poor Remote;   Poor  Judgement:  Poor  Insight:  Lacking  Psychomotor Activity:  Increased  Concentration:  Concentration: Poor  Recall:  Poor  Fund of Knowledge:  Fair  Language:  Fair  Akathisia:  No  Handed:  Right  AIMS (if indicated):     Assets:  Communication Skills Desire for Improvement Financial Resources/Insurance Housing Resilience Social Support  ADL's:  Intact  Cognition:  WNL  Sleep:  Number of Hours: 2.5     Treatment Plan Summary: Daily contact with patient to assess and evaluate symptoms and progress in treatment and Medication management   [name] is a 56 year old female with history of depression admitted for worsening of her symptoms, paranoia and suicidal threats in the context of treatment noncompliance.  *   Agitation. Haldol, Ativan and Benadryl are available per forced medication order.  1. Suicidal ideation. The patient is able to contract for safety in the hospital.  2. Mood, anxiety and psychosis. We started Zyprexa and Haldol for psychosis, Depakote for mood stabilization and Luvox for depression, anxiety, and trichotillomania. She is on forced medications now and refuses oral  meds. VPA level 20, ammonia low on 10/10/2016.   3. Hypertension. She is on lisinopril, furosemide, and potassium. Blood pressure is normal.   4. Coronary artery disease. She is on Lipitor and nitroglycerin. We will order EKG when the patient is calmer.  5. GERD. She is on  Protonix.  6. COPD. She is on [place].  7. Urinary retention. She is on Flomax.  8. Insomnia. Trazodone is available.  9. Metabolic syndrome monitoring. Lipid panel and TSH are normal. Hemoglobin A1c 6.3.    10. B12 deficiency. B12 level is low. We tried B12 injections but the patient refuses.  11. Disposition. She will be discharged to home with her husband. She will follow up with mental health professionals.  Alethia Berthold, MD 10/15/2016, 2:24 PM   Patient ID: Marti Sleigh, female   DOB: 04-15-1961, 56 y.o.   MRN: [id number] Patient ID: ELLORA VARNUM, female   DOB: October 23, 1960, 56 y.o.   MRN: [id number]  Blood pressure 117/83, pulse 95, temperature 98.6 F (37 C), temperature source Oral, resp. rate 18, height  5' 1"  (1.549 m), weight 71.2 kg (157 lb), SpO2 98 %.Body mass index is 29.66 kg/m.  General Appearance: Disheveled  Eye Contact:  Good  Speech:  Pressured  Volume:  Increased  Mood:  Anxious  Affect:  Congruent, anxious  Thought Process:  Disorganized and Descriptions of Associations: Loose  Orientation:  Full (Time, Place, and Person)  Thought Content:  Illogical, Delusions, Paranoid Ideation and Rumination  Suicidal Thoughts:  denies  Homicidal Thoughts:  No  Memory:  Immediate;   Poor Recent;   Poor Remote;   Poor  Judgement:  Poor  Insight:  Lacking  Psychomotor Activity:  Increased  Concentration:  Concentration: Poor  Recall:  Poor  Fund of Knowledge:  Fair  Language:  Fair  Akathisia:  No  Handed:  Right  AIMS (if indicated):     Assets:  Communication Skills Desire for Improvement Financial Resources/Insurance Housing Resilience Social Support  ADL's:  Intact  Cognition:  WNL  Sleep:  Number of Hours: 2.5     Treatment Plan Summary: Daily contact with patient to assess and evaluate symptoms and progress in treatment and Medication management   Ms. Pell is a 56 year old female with history of depression admitted for worsening of  her symptoms, paranoia and suicidal threats in the context of treatment noncompliance.  *   Agitation. Haldol, Ativan and Benadryl are available per forced medication order.  1. Suicidal ideation. The patient is able to contract for safety in the hospital.  2. Mood, anxiety and psychosis. We started Zyprexa and Haldol for psychosis, Depakote for mood stabilization and Luvox for depression, anxiety, and trichotillomania. She is on forced medications now and refuses oral  meds. VPA level 20, ammonia low on 10/10/2016.   3. Hypertension. She is on lisinopril, furosemide, and potassium. Blood pressure is normal.   4. Coronary artery disease. She is on Lipitor and nitroglycerin. We will order EKG when the patient is calmer.  5. GERD. She is on Protonix.  6. COPD. She is on [place].  7. Urinary retention. She is on Flomax.  8. Insomnia. Trazodone is available.  9. Metabolic syndrome monitoring. Lipid panel and TSH are normal. Hemoglobin A1c 6.3.    10. B12 deficiency. B12 level is low. We tried B12 injections but the patient refuses.  11. Disposition. She will be discharged to home with her husband. She will follow up with mental health professionals. I discussed with the patient possibility of ECT to treat her severe depression. Patient listened with some attention but made no comment. Was not ready to agreed to treatment plan. Still has limited insight. No change to medication today. Looks like she is getting better a little bit.  Alethia Berthold, MD 10/15/2016, 2:24 PM   Patient ID: Marti Sleigh, female   DOB: 1961/08/02, 56 y.o.   MRN: [id number] Patient ID: BELLANY ELBAUM, female   DOB: 23-Jun-1961, 56 y.o.   MRN: [id number]

## 2016-10-15 NOTE — Progress Notes (Cosign Needed)
Pt daugher called the unit to inquire about mother and is requesting that MD call her to update. Daughter believes the issues may stem from when she almost died during heart surgery and then fell at home requiring two back surgeries. She states that her mother didn't have any of the current issues that she does prior to these events. She thinks her mom may have TBI or that maybe she went without O2 for too long during the Heart surgery. Daughters name is Hinton Rao. [phone]

## 2016-10-15 NOTE — [facility] Group Notes (Signed)
[facility] LCSW Group Therapy  10/15/2016 1 pm  Type of Therapy:  Group Therapy  Participation Level: Did not attend but was invited to attend  Kirsten Castro M 10/15/2016, 2:23 PM  

## 2016-10-16 NOTE — Progress Notes (Signed)
[facility] MD Progress Note  10/16/2016 3:02 PM Kirsten Castro  MRN:  [id number]  Subjective:   [name] has a history of depression, possibly bipolar disorder. She is floridly psychotic and refused medications twice in the past 24 hours. FORCED MEDICATION order since 10/04/2016.  10/08/2016 Pt seen, chart reviewed, discussed with nursing staff. Pt is reluctant to take med, but taking, had breakfast in am. States mood is " so so ", no ne w complaints. on FMP. Pt still psychotic, delusional, paranoid as before. Pt states she is anxious, and nothing will help, states she hears voices as before. Denies SI/HI.   10/09/2016. Ms Kirsten Castro remains uncooperative and rude. She is on forced medication order. She composes taking any medications by mouth or by injection with an argument that she has not been eating any food here, therefore she should not be given any medications. She still wants me to check her body for "fluids". SW spoke with the husband who believes that all her problems started following bypass surgery with complications leading to confusion and behavioral changes. We will continue forced medication orders as the patient is still psychotic and unable to make educated decision about her treatment.  10/10/2016. [name] met with the treatment team for the first time today. She is very anxious appearing, making strange noises, unable to sit still, fidgety, and slightly agitated. She however was able to have a brief conversation with her treatment team. She was telling us again that she is unable to take any medications and should not be given any because she is "full of water and doesn't eat anything here". She did take her medications today with encouragement. She does better when we offered her Coca-Cola to drink. She also signed her name on the form indicating that she participated in the meeting with no hesitation. She agreed to draw labs trhis morning. Unfortunately VPA level is only 20 as she  refused most of her doses. The patient is unconvinced that medications are helpful. Moreover she believes that it's "too late" now for treatment. This statement suggests a diagnosis of depression with psychotic features.  10/11/2016. [name] appears slightly better today. She ate half of her breakfast and lunch. She properly introduced herself to a staff member this morning. On direkt questioning, she tells me that she is feeling "no good". She refused medications even with encouragement and a bribe with Coca cola. She complains of being full of water, unable to drink, or eat, unable to urinate. Indeed, she has a history of urinary retention. Per nursing, she wears a diaper but in spite of her complaints of "water coming from the front and the back", her diaper is frequently dry. She refuses Flomax, even though she knows why this was prescribed. She refuses blood pressure medications but her blood pressure has not been elevated. She adamantly refuses to see a Conservator, museum/gallery. I will offer it to her tomorrow again.  10/12/2016. There is no change. She did take some medications this morning but refused most of them. We continue Haldol, Zyprexa and Depakote for psychosis and mood stabilization. She refuses to see medical doctor.  10/13/2016. For the first time today [name] told me that she is doing "so-so". She then immediately preceded to her regular amount about being full of water and not able to take medications. She admitted that she ate half of the breakfast today. She refuses oral medications most of the time but accepts Haldol and Zyprexa by injection. She has been  missing her Plavix, antihypertensives, and Flomax. Blood pressure is stable. Heart rate is slightly elevated.  Follow-up for the 13th. Patient seen. She seems a little confused. Didn't know where she was briefly. Still seems psychotic and agitated and refusing medication. Stays mostly isolated from others on the unit. No specific  new physical complaints  Follow-up the 14th. Patient is still talking about how she has to much water inside her body. She is eating however. Grooming herself better. Denies suicidal thoughts. Still has poor insight and appears very slow in her thinking.  10/16/2016 [name] is feeling "bad" again. She is better groomed and dressed but did not respond well to my complements. Still complains of peer appetite but did not mentioned "water" with me today. Dr. Weber Cooks spoke about ECT with her over the weekend but the patient would not commit. She started attending groups. She is not as irritable as before.   Per nursing; D: Patient is alert and not oriented on the unit this shift. Patient not attended and actively participated in groups today. Patient denies suicidal ideation, homicidal ideation, auditory or visual hallucinations at the present time.  A: Scheduled medications are administered to patient as per MD orders. Emotional support and encouragement are provided. Patient is maintained on q.15 minute safety checks. Patient is informed to notify staff with questions or concerns. R: No adverse medication reactions are noted. Patient is cooperative with medication administration with reassurance   Patient is nonreceptive, irritable and not cooperative on the unit at this time. Patient does not interact  with others on the unit this shift. Patient contracts for safety at this time. Patient remains safe at this time.  Principal Problem: Severe recurrent major depression with psychotic features [facility]) Diagnosis:   Patient Active Problem List   Diagnosis Date Noted  . Severe recurrent major depression with psychotic features ([place]) [F33.3] 10/02/2016  . Benzodiazepine withdrawal ([place]) [F13.239] 10/02/2016  . 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 Abdominal pain [R10.9] 04/19/2016  . Sepsis ([place]) [A41.9] 03/09/2016  . Urinary retention [R33.9] 03/09/2016  . CAD (coronary artery disease) [I25.10]  03/09/2016  . Frank hematuria [R31.0] 03/08/2016  . Temporary cerebral vascular dysfunction [G93.9] 02/03/2016  . Peripheral vascular disease ([place]) [I73.9] 02/03/2016  . BP (high blood pressure) [I10] 02/03/2016  . HLD (hyperlipidemia) [E78.5] 02/03/2016  . Clinical depression [F32.9] 02/03/2016  . Carotid artery narrowing [I65.29] 02/03/2016  . 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 Malignant neoplastic disease ([place]) [C80.1] 02/03/2016  . Anxiety [F41.9] 02/03/2016  . Abdominal pain, generalized [R10.84]   . Nausea with vomiting [R11.2]   . Gastritis [K29.70]   . Esophageal candidiasis ([place]) [B37.81]   . Noninfectious diarrhea [K52.9]   . Intractable pain [R52] 01/21/2016  . Upper abdominal pain [R10.10]   . Nausea and vomiting in adult [R11.2]   . Ischemic colitis ([place]) [K55.9] 01/01/2016  . Colitis [K52.9] 12/15/2015  . GI bleed [K92.2] 12/15/2015  . Acute inflammation of the pancreas [K85.90] 12/06/2015  . Vitamin B12 deficiency anemia [D51.9] 02/25/2015  . Absolute anemia [D64.9] 02/25/2015  . Carotid stenosis [I65.29] 02/10/2015  . Personal history of other specified conditions [Z87.898] 11/27/2014  . Osteomyelitis ([place]) [M86.9] 11/27/2014  . H/O coronary artery bypass surgery [Z95.1] 05/18/2014   Total Time spent with patient: 20 minutes  Past Psychiatric History: depression.  Past Medical History:  Past Medical History:  Diagnosis Date  . Anemia   . Anginal pain ([place])   . Anxiety   .  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  This was done at [facility]  . BP (high blood pressure) 02/03/2016  . Cancer ([place])    melanoma skin cancer  . Carotid stenosis 02/10/2015  . Chronic kidney disease    UTI  . Colitis 12/15/2015  . Collagen vascular disease ([place])   . COPD (chronic obstructive pulmonary disease) ([place])   . Coronary artery disease   . Depression   . Esophageal candidiasis ([place])   . Gastritis   . GERD (gastroesophageal reflux disease)   . GI bleed 12/15/2015  . Headache   . Hypertension   . Ischemic colitis ([place]) 01/01/2016   Overview:   SMA mesenteric ischemia now status post angioplasty by vascular of In stent restenosis.   Overview:  S?P SMA angioplasty   . Myocardial infarction   . Nausea and vomiting in adult   . Noninfectious diarrhea   . Peripheral vascular disease ([place]) 02/03/2016   Overview:  Follows with Dr. Delana Meyer    . Shortness of breath dyspnea    with exertion  . Stroke [facility])    TIA X 2  . Temporary cerebral vascular dysfunction 02/03/2016    Past Surgical History:  Procedure Laterality Date  . ABDOMINAL HYSTERECTOMY    . BACK SURGERY    . BREAST BIOPSY Left 6 2017   results not in yet  . COLONOSCOPY WITH PROPOFOL N/A 01/24/2016   Procedure: COLONOSCOPY WITH PROPOFOL;  Surgeon: Lucilla Lame, MD;  Location: [facility] ENDOSCOPY;  Service: Endoscopy;  Laterality: N/A;  . CORONARY ANGIOPLASTY    . CORONARY ARTERY BYPASS GRAFT    . ESOPHAGOGASTRODUODENOSCOPY (EGD) WITH PROPOFOL N/A 01/24/2016   Procedure: ESOPHAGOGASTRODUODENOSCOPY (EGD) WITH PROPOFOL;  Surgeon: Lucilla Lame, MD;  Location: [facility] ENDOSCOPY;  Service: Endoscopy;  Laterality: N/A;  . KYPHOPLASTY N/A 02/08/2016   Procedure: KYPHOPLASTY L1;  Surgeon: Hessie Knows, MD;  Location: [facility] ORS;  Service: Orthopedics;  Laterality: N/A;  . KYPHOPLASTY N/A 03/28/2016   Procedure: KYPHOPLASTY;  Surgeon: Hessie Knows, MD;  Location: [facility] ORS;  Service: Orthopedics;  Laterality: N/A;  . NOSE SURGERY     cancer removal  . OTHER SURGICAL HISTORY  Jul 11 2014   sternum  removal  . PERIPHERAL VASCULAR CATHETERIZATION N/A 02/10/2015   Procedure: Carotid PTA/Stent Intervention;  Surgeon: Katha Cabal, MD;  Location: [place] CV LAB;  Service: Cardiovascular;  Laterality: N/A;  . PERIPHERAL VASCULAR CATHETERIZATION N/A 12/17/2015   Procedure: Visceral Angiography;  Surgeon: Katha Cabal, MD;  Location: [place] CV LAB;  Service: Cardiovascular;  Laterality: N/A;  . PERIPHERAL VASCULAR CATHETERIZATION N/A 12/17/2015   Procedure: Visceral Artery Intervention;  Surgeon: Katha Cabal, MD;  Location: [place] CV LAB;  Service: Cardiovascular;  Laterality: N/A;  . STENT PLACEMENT VASCULAR ([place] HX)     Family History:  Family History  Problem Relation Age of Onset  . Breast cancer Sister   . CAD Father   . CAD Brother    Family Psychiatric  History: see H&P. Social History:  History  Alcohol Use No  History  Drug Use No    Social History   Social History  . Marital status: Married    Spouse name: N/A  . Number of children: N/A  . Years of education: N/A   Social History Main Topics  . Smoking status: Former Smoker    Packs/day: 1.00    Types: Cigarettes    Quit date: 08/01/2012  . Smokeless tobacco: Never Used  . Alcohol use No  . Drug use: No  . Sexual activity: Yes   Other Topics Concern  . None   Social History Narrative  . None   Additional Social History:                         Sleep: Fair  Appetite:  Poor  Current Medications: Current Facility-Administered Medications  Medication Dose Route Frequency Provider Last Rate Last Dose  . acetaminophen (TYLENOL) tablet 650 mg  650 mg Oral Q6H PRN Gonzella Lex, MD   650 mg at 10/14/16 2046  . alum & mag hydroxide-simeth (MAALOX/MYLANTA) 200-200-20 MG/5ML suspension 30 mL  30 mL Oral Q4H PRN Gonzella Lex, MD      . amLODipine (NORVASC) tablet 10 mg  10 mg Oral Daily Croix Presley B Machai Desmith, MD   10 mg at 10/16/16 0840  . atorvastatin (LIPITOR)  tablet 80 mg  80 mg Oral q1800 Gonzella Lex, MD   80 mg at 10/15/16 1710  . clopidogrel (PLAVIX) tablet 75 mg  75 mg Oral Daily Gonzella Lex, MD   75 mg at 10/16/16 0840  . divalproex (DEPAKOTE) DR tablet 500 mg  500 mg Oral Q12H Alizia Greif B Tiana Sivertson, MD   500 mg at 10/16/16 0840  . fluvoxaMINE (LUVOX) tablet 200 mg  200 mg Oral QHS Alyssandra Hulsebus B Latara Micheli, MD   200 mg at 10/15/16 2200  . furosemide (LASIX) tablet 20 mg  20 mg Oral Daily Makinzi Prieur B Jerzi Tigert, MD   20 mg at 10/16/16 0840  . haloperidol lactate (HALDOL) injection 10 mg  10 mg Intramuscular Daily Malini Flemings B Emmajo Bennette, MD   10 mg at 10/13/16 0857   Or  . haloperidol (HALDOL) tablet 10 mg  10 mg Oral Daily Yann Biehn B Jiaire Rosebrook, MD   10 mg at 10/16/16 0840  . magnesium hydroxide (MILK OF MAGNESIA) suspension 30 mL  30 mL Oral Daily PRN Gonzella Lex, MD      . mometasone-formoterol [facility]) 200-5 MCG/ACT inhaler 2 puff  2 puff Inhalation BID Gonzella Lex, MD   2 puff at 10/15/16 2000  . nitroGLYCERIN (NITROSTAT) SL tablet 0.4 mg  0.4 mg Sublingual Q5 min PRN Gonzella Lex, MD   0.4 mg at 10/14/16 2118  . OLANZapine (ZYPREXA) injection 15 mg  15 mg Intramuscular BID PRN Gonzella Lex, MD   15 mg at 10/10/16 2150  . OLANZapine zydis (ZYPREXA) disintegrating tablet 15 mg  15 mg Oral BID AC & HS Teresita Fanton B Susan Arana, MD   15 mg at 10/16/16 0841  . potassium chloride SA (K-DUR,KLOR-CON) CR tablet 10 mEq  10 mEq Oral Daily Arslan Kier B Folasade Mooty, MD   10 mEq at 10/16/16 0840  . tamsulosin (FLOMAX) capsule 0.4 mg  0.4 mg Oral Daily Elin Seats B Colene Mines, MD   0.4 mg at 10/16/16 0840  . traZODone (DESYREL) tablet 50 mg  50 mg Oral QHS Gonzella Lex, MD   50 mg at 10/15/16 2200    Lab Results:  No results  found for this or any previous visit (from the past 48 hour(s)).  Blood Alcohol level:  Lab Results  Component Value Date   ETH <5 10/02/2016   ETH <5 76/28/3151    Metabolic Disorder Labs: Lab Results  Component Value Date    HGBA1C 6.3 (H) 10/10/2016   MPG 134 10/10/2016   No results found for: PROLACTIN Lab Results  Component Value Date   CHOL 134 10/03/2016   TRIG 88 10/03/2016   HDL 53 10/03/2016   CHOLHDL 2.5 10/03/2016   VLDL 18 10/03/2016   LDLCALC 63 10/03/2016    Physical Findings: AIMS:  , ,  ,  ,    CIWA:    COWS:     Musculoskeletal: Strength & Muscle Tone: within normal limits Gait & Station: normal Patient leans: N/A  Psychiatric Specialty Exam: Physical Exam  Nursing note and vitals reviewed.   Review of Systems  Psychiatric/Behavioral: Positive for hallucinations and suicidal ideas. The patient is nervous/anxious.   All other systems reviewed and are negative.  [facility] MD Progress Note  10/16/2016 3:02 PM TRACIA LACOMB  MRN:  [id number]  Subjective:   Ms. Pilling has a history of depression, possibly bipolar disorder. She is floridly psychotic and refused medications twice in the past 24 hours. FORCED MEDICATION order since 10/04/2016.  10/08/2016 Pt seen, chart reviewed, discussed with nursing staff. Pt is reluctant to take med, but taking, had breakfast in am. States mood is " so so ", no ne w complaints. on FMP. Pt still psychotic, delusional, paranoid as before. Pt states she is anxious, and nothing will help, states she hears voices as before. Denies SI/HI.   10/09/2016. Ms Goyne remains uncooperative and rude. She is on forced medication order. She composes taking any medications by mouth or by injection with an argument that she has not been eating any food here, therefore she should not be given any medications. She still wants me to check her body for "fluids". SW spoke with the husband who believes that all her problems started following bypass surgery with complications leading to confusion and behavioral changes. We will continue forced medication orders as the patient is still psychotic and unable to make educated decision about her treatment.  10/10/2016. [name]  met with the treatment team for the first time today. She is very anxious appearing, making strange noises, unable to sit still, fidgety, and slightly agitated. She however was able to have a brief conversation with her treatment team. She was telling us again that she is unable to take any medications and should not be given any because she is "full of water and doesn't eat anything here". She did take her medications today with encouragement. She does better when we offered her Coca-Cola to drink. She also signed her name on the form indicating that she participated in the meeting with no hesitation. She agreed to draw labs trhis morning. Unfortunately VPA level is only 20 as she refused most of her doses. The patient is unconvinced that medications are helpful. Moreover she believes that it's "too late" now for treatment. This statement suggests a diagnosis of depression with psychotic features.  10/11/2016. [name] appears slightly better today. She ate half of her breakfast and lunch. She properly introduced herself to a staff member this morning. On direkt questioning, she tells me that she is feeling "no good". She refused medications even with encouragement and a bribe with Coca cola. She complains of being full of water, unable  to drink, or eat, unable to urinate. Indeed, she has a history of urinary retention. Per nursing, she wears a diaper but in spite of her complaints of "water coming from the front and the back", her diaper is frequently dry. She refuses Flomax, even though she knows why this was prescribed. She refuses blood pressure medications but her blood pressure has not been elevated. She adamantly refuses to see a Conservator, museum/gallery. I will offer it to her tomorrow again.  10/12/2016. There is no change. She did take some medications this morning but refused most of them. We continue Haldol, Zyprexa and Depakote for psychosis and mood stabilization. She refuses to see medical  doctor.  10/13/2016. For the first time today [name] told me that she is doing "so-so". She then immediately preceded to her regular amount about being full of water and not able to take medications. She admitted that she ate half of the breakfast today. She refuses oral medications most of the time but accepts Haldol and Zyprexa by injection. She has been missing her Plavix, antihypertensives, and Flomax. Blood pressure is stable. Heart rate is slightly elevated.  Per nursing; D: Pt denies SI/HI/AVH but noted responding to internal stimuli. Patient's affect is flat, thoughts are disorganized.  Pt appears anxious, irritable and verbally aggressive and at times using profanities towards staff. Patient was advised to stop causing at staff. Patient is not interacting with peers and staff appropriately.  A: Pt was offered support and encouragement. Pt was given scheduled medications. Pt was encouraged to attend groups. Q 15 minute checks were done for safety.  R:Pt did not attend group. Pt is complaint with medication. Pt is not receptive to treatment. 15 minutes checks maintained on unit, will continue to monitor. .  Principal Problem: Severe recurrent major depression with psychotic features ([place]) Diagnosis:   Patient Active Problem List   Diagnosis Date Noted  . Severe recurrent major depression with psychotic features ([place]) [F33.3] 10/02/2016  . Benzodiazepine withdrawal ([place]) [F13.239] 10/02/2016  . 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 Abdominal pain [R10.9] 04/19/2016  . Sepsis ([place]) [A41.9] 03/09/2016  . Urinary retention [R33.9] 03/09/2016  . CAD (coronary artery disease) [I25.10] 03/09/2016  . Frank hematuria [R31.0] 03/08/2016  . Temporary cerebral vascular dysfunction [G93.9] 02/03/2016  . Peripheral vascular disease ([place]) [I73.9] 02/03/2016  . BP (high blood pressure) [I10] 02/03/2016  . HLD (hyperlipidemia) [E78.5] 02/03/2016  . Clinical depression [F32.9] 02/03/2016  .  Carotid artery narrowing [I65.29] 02/03/2016  . 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 Malignant neoplastic disease ([place]) [C80.1] 02/03/2016  . Anxiety [F41.9] 02/03/2016  . Abdominal pain, generalized [R10.84]   . Nausea with vomiting [R11.2]   . Gastritis [K29.70]   . Esophageal candidiasis ([place]) [B37.81]   . Noninfectious diarrhea [K52.9]   . Intractable pain [R52] 01/21/2016  . Upper abdominal pain [R10.10]   . Nausea and vomiting in adult [R11.2]   . Ischemic colitis ([place]) [K55.9] 01/01/2016  . Colitis [K52.9] 12/15/2015  . GI bleed [K92.2] 12/15/2015  . Acute inflammation of the pancreas [K85.90] 12/06/2015  . Vitamin B12 deficiency anemia [D51.9] 02/25/2015  . Absolute anemia [D64.9] 02/25/2015  . Carotid stenosis [I65.29] 02/10/2015  . Personal history of other specified conditions [Z87.898] 11/27/2014  . Osteomyelitis ([place]) [M86.9] 11/27/2014  . H/O coronary artery bypass surgery [Z95.1] 05/18/2014   Total Time spent with patient: 20 minutes  Past Psychiatric History: depression.  Past Medical History:  Past Medical History:  Diagnosis Date  .  Anemia   . Anginal pain ([place])   . Anxiety   . 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  This was done at [facility]  . BP (high blood pressure) 02/03/2016  . Cancer ([place])    melanoma skin cancer  . Carotid stenosis 02/10/2015  . Chronic kidney disease    UTI  . Colitis 12/15/2015  . Collagen vascular disease ([place])   . COPD (chronic obstructive pulmonary disease) ([place])   . Coronary artery disease   .  Depression   . Esophageal candidiasis ([place])   . Gastritis   . GERD (gastroesophageal reflux disease)   . GI bleed 12/15/2015  . Headache   . Hypertension   . Ischemic colitis ([place]) 01/01/2016   Overview:   SMA mesenteric ischemia now status post angioplasty by vascular of In stent restenosis.   Overview:  S?P SMA angioplasty   . Myocardial infarction   . Nausea and vomiting in adult   . Noninfectious diarrhea   . Peripheral vascular disease ([place]) 02/03/2016   Overview:  Follows with Dr. Delana Meyer    . Shortness of breath dyspnea    with exertion  . Stroke [facility])    TIA X 2  . Temporary cerebral vascular dysfunction 02/03/2016    Past Surgical History:  Procedure Laterality Date  . ABDOMINAL HYSTERECTOMY    . BACK SURGERY    . BREAST BIOPSY Left 6 2017   results not in yet  . COLONOSCOPY WITH PROPOFOL N/A 01/24/2016   Procedure: COLONOSCOPY WITH PROPOFOL;  Surgeon: Lucilla Lame, MD;  Location: [facility] ENDOSCOPY;  Service: Endoscopy;  Laterality: N/A;  . CORONARY ANGIOPLASTY    . CORONARY ARTERY BYPASS GRAFT    . ESOPHAGOGASTRODUODENOSCOPY (EGD) WITH PROPOFOL N/A 01/24/2016   Procedure: ESOPHAGOGASTRODUODENOSCOPY (EGD) WITH PROPOFOL;  Surgeon: Lucilla Lame, MD;  Location: [facility] ENDOSCOPY;  Service: Endoscopy;  Laterality: N/A;  . KYPHOPLASTY N/A 02/08/2016   Procedure: KYPHOPLASTY L1;  Surgeon: Hessie Knows, MD;  Location: [facility] ORS;  Service: Orthopedics;  Laterality: N/A;  . KYPHOPLASTY N/A 03/28/2016   Procedure: KYPHOPLASTY;  Surgeon: Hessie Knows, MD;  Location: [facility] ORS;  Service: Orthopedics;  Laterality: N/A;  . NOSE SURGERY     cancer removal  . OTHER SURGICAL HISTORY  Jul 11 2014   sternum removal  . PERIPHERAL VASCULAR CATHETERIZATION N/A 02/10/2015   Procedure: Carotid PTA/Stent Intervention;  Surgeon: Katha Cabal, MD;  Location: [place] CV LAB;  Service: Cardiovascular;  Laterality: N/A;  . PERIPHERAL VASCULAR CATHETERIZATION N/A 12/17/2015   Procedure: Visceral Angiography;   Surgeon: Katha Cabal, MD;  Location: [place] CV LAB;  Service: Cardiovascular;  Laterality: N/A;  . PERIPHERAL VASCULAR CATHETERIZATION N/A 12/17/2015   Procedure: Visceral Artery Intervention;  Surgeon: Katha Cabal, MD;  Location: [place] CV LAB;  Service: Cardiovascular;  Laterality: N/A;  . STENT PLACEMENT VASCULAR ([place] HX)     Family History:  Family History  Problem Relation Age of Onset  . Breast cancer Sister   . CAD Father   . CAD Brother    Family Psychiatric  History: see H&P. Social History:  History  Alcohol Use No     History  Drug Use No    Social History   Social History  . Marital status: Married    Spouse name: N/A  . Number of children: N/A  . Years of education: N/A   Social History Main Topics  . Smoking status: Former Smoker    Packs/day: 1.00    Types: Cigarettes    Quit date: 08/01/2012  . Smokeless tobacco: Never Used  . Alcohol use No  . Drug use: No  . Sexual activity: Yes   Other Topics Concern  . None   Social History Narrative  . None   Additional Social History:                         Sleep: Fair  Appetite:  Poor  Current Medications: Current Facility-Administered Medications  Medication Dose Route Frequency Provider Last Rate Last Dose  . acetaminophen (TYLENOL) tablet 650 mg  650 mg Oral Q6H PRN Gonzella Lex, MD   650 mg at 10/14/16 2046  . alum & mag hydroxide-simeth (MAALOX/MYLANTA) 200-200-20 MG/5ML suspension 30 mL  30 mL Oral Q4H PRN Gonzella Lex, MD      . amLODipine (NORVASC) tablet 10 mg  10 mg Oral Daily Mischa Brittingham B Marne Meline, MD   10 mg at 10/16/16 0840  . atorvastatin (LIPITOR) tablet 80 mg  80 mg Oral q1800 Gonzella Lex, MD   80 mg at 10/15/16 1710  . clopidogrel (PLAVIX) tablet 75 mg  75 mg Oral Daily Gonzella Lex, MD   75 mg at 10/16/16 0840  . divalproex (DEPAKOTE) DR tablet 500 mg  500 mg Oral Q12H Toniann Dickerson B Luisdaniel Kenton, MD   500 mg at 10/16/16 0840  . fluvoxaMINE  (LUVOX) tablet 200 mg  200 mg Oral QHS Kincaid Tiger B Filimon Miranda, MD   200 mg at 10/15/16 2200  . furosemide (LASIX) tablet 20 mg  20 mg Oral Daily Gracelynne Benedict B Thang Flett, MD   20 mg at 10/16/16 0840  . haloperidol lactate (HALDOL) injection 10 mg  10 mg Intramuscular Daily Jehiel Koepp B Adedamola Seto, MD   10 mg at 10/13/16 0857   Or  . haloperidol (HALDOL) tablet 10 mg  10 mg Oral Daily Iliana Hutt B Matison Nuccio, MD   10 mg at 10/16/16 0840  . magnesium hydroxide (MILK OF MAGNESIA) suspension 30 mL  30 mL Oral Daily PRN Gonzella Lex, MD      . mometasone-formoterol [facility]) 200-5 MCG/ACT inhaler 2 puff  2 puff Inhalation BID Gonzella Lex, MD   2 puff at 10/15/16 2000  . nitroGLYCERIN (NITROSTAT) SL tablet 0.4 mg  0.4 mg Sublingual Q5 min PRN Gonzella Lex, MD   0.4 mg at 10/14/16 2118  . OLANZapine (ZYPREXA) injection 15 mg  15 mg Intramuscular BID PRN Gonzella Lex, MD   15 mg at 10/10/16 2150  . OLANZapine zydis (ZYPREXA) disintegrating tablet 15 mg  15 mg Oral BID AC & HS Chelle Cayton B Kenderick Kobler, MD   15 mg at 10/16/16 0841  . potassium chloride SA (K-DUR,KLOR-CON) CR tablet 10 mEq  10 mEq Oral Daily Darlene Brozowski B Jameal Razzano, MD   10 mEq at 10/16/16 0840  . tamsulosin (FLOMAX) capsule 0.4 mg  0.4 mg Oral Daily Vernona Peake B Kyera Felan, MD   0.4 mg at 10/16/16 0840  . traZODone (DESYREL) tablet 50 mg  50 mg Oral QHS Gonzella Lex, MD  50 mg at 10/15/16 2200    Lab Results:  No results found for this or any previous visit (from the past 48 hour(s)).  Blood Alcohol level:  Lab Results  Component Value Date   ETH <5 10/02/2016   ETH <5 40/98/1191    Metabolic Disorder Labs: Lab Results  Component Value Date   HGBA1C 6.3 (H) 10/10/2016   MPG 134 10/10/2016   No results found for: PROLACTIN Lab Results  Component Value Date   CHOL 134 10/03/2016   TRIG 88 10/03/2016   HDL 53 10/03/2016   CHOLHDL 2.5 10/03/2016   VLDL 18 10/03/2016   LDLCALC 63 10/03/2016    Physical Findings: AIMS:  , ,  ,  ,     CIWA:    COWS:     Musculoskeletal: Strength & Muscle Tone: within normal limits Gait & Station: normal Patient leans: N/A  Psychiatric Specialty Exam: Physical Exam  Nursing note and vitals reviewed.   Review of Systems  Psychiatric/Behavioral: Positive for hallucinations and suicidal ideas. The patient is nervous/anxious.   All other systems reviewed and are negative.   Blood pressure (!) 110/55, pulse (!) 111, temperature 98.4 F (36.9 C), temperature source Oral, resp. rate 20, height 5' 1"  (1.549 m), weight 71.2 kg (157 lb), SpO2 98 %.Body mass index is 29.66 kg/m.  General Appearance: Disheveled  Eye Contact:  Good  Speech:  Pressured  Volume:  Increased  Mood:  Anxious  Affect:  Congruent, anxious  Thought Process:  Disorganized and Descriptions of Associations: Loose  Orientation:  Full (Time, Place, and Person)  Thought Content:  Illogical, Delusions, Paranoid Ideation and Rumination  Suicidal Thoughts:  denies  Homicidal Thoughts:  No  Memory:  Immediate;   Poor Recent;   Poor Remote;   Poor  Judgement:  Poor  Insight:  Lacking  Psychomotor Activity:  Increased  Concentration:  Concentration: Poor  Recall:  Poor  Fund of Knowledge:  Fair  Language:  Fair  Akathisia:  No  Handed:  Right  AIMS (if indicated):     Assets:  Communication Skills Desire for Improvement Financial Resources/Insurance Housing Resilience Social Support  ADL's:  Intact  Cognition:  WNL  Sleep:  Number of Hours: 8     Treatment Plan Summary: Daily contact with patient to assess and evaluate symptoms and progress in treatment and Medication management   [name] is a 56 year old female with history of depression admitted for worsening of her symptoms, paranoia and suicidal threats in the context of treatment noncompliance.  *   Agitation. Haldol, Ativan and Benadryl are available per forced medication order.  1. Suicidal ideation. The patient is able to contract for  safety in the hospital.  2. Mood, anxiety and psychosis. We started Zyprexa and Haldol for psychosis, Depakote for mood stabilization and Luvox for depression, anxiety, and trichotillomania. She is on forced medications now and refuses oral  meds. VPA level 20, ammonia low on 10/10/2016.   3. Hypertension. She is on lisinopril, furosemide, and potassium. Blood pressure is normal.   4. Coronary artery disease. She is on Lipitor and nitroglycerin. We will order EKG when the patient is calmer.  5. GERD. She is on Protonix.  6. COPD. She is on [place].  7. Urinary retention. She is on Flomax.  8. Insomnia. Trazodone is available.  9. Metabolic syndrome monitoring. Lipid panel and TSH are normal. Hemoglobin A1c 6.3.    10. B12 deficiency. B12 level is low. We tried B12 injections  but the patient refuses.  11. Disposition. She will be discharged to home with her husband. She will follow up with mental health professionals.  Orson Slick, MD 10/16/2016, 3:02 PM   Patient ID: Kirsten Castro, female   DOB: 05-Dec-1960, 56 y.o.   MRN: [id number] Patient ID: LISET MCMONIGLE, female   DOB: [birth year], 56 y.o.   MRN: [id number]  Blood pressure (!) 110/55, pulse (!) 111, temperature 98.4 F (36.9 C), temperature source Oral, resp. rate 20, height 5' 1"  (1.549 m), weight 71.2 kg (157 lb), SpO2 98 %.Body mass index is 29.66 kg/m.  General Appearance: Disheveled  Eye Contact:  Good  Speech:  Pressured  Volume:  Increased  Mood:  Anxious  Affect:  Congruent, anxious  Thought Process:  Disorganized and Descriptions of Associations: Loose  Orientation:  Full (Time, Place, and Person)  Thought Content:  Illogical, Delusions, Paranoid Ideation and Rumination  Suicidal Thoughts:  denies  Homicidal Thoughts:  No  Memory:  Immediate;   Poor Recent;   Poor Remote;   Poor  Judgement:  Poor  Insight:  Lacking  Psychomotor Activity:  Increased  Concentration:  Concentration: Poor   Recall:  Poor  Fund of Knowledge:  Fair  Language:  Fair  Akathisia:  No  Handed:  Right  AIMS (if indicated):     Assets:  Communication Skills Desire for Improvement Financial Resources/Insurance Housing Resilience Social Support  ADL's:  Intact  Cognition:  WNL  Sleep:  Number of Hours: 8     Treatment Plan Summary: Daily contact with patient to assess and evaluate symptoms and progress in treatment and Medication management   [name] is a 56 year old female with history of depression admitted for worsening of her symptoms, paranoia and suicidal threats in the context of treatment noncompliance.  *   Agitation. Haldol, Ativan and Benadryl are available per forced medication order.  1. Suicidal ideation. The patient is able to contract for safety in the hospital.  2. Mood, anxiety and psychosis. We started Zyprexa and Haldol for psychosis, Depakote for mood stabilization and Luvox for depression, anxiety, and trichotillomania. She is on forced medications now and refuses oral  meds. VPA level 20, ammonia low on 10/10/2016.   3. Hypertension. She is on lisinopril, furosemide, and potassium. Blood pressure is normal.   4. Coronary artery disease. She is on Lipitor and nitroglycerin. We will order EKG when the patient is calmer.  5. GERD. She is on Protonix.  6. COPD. She is on [place].  7. Urinary retention. She is on Flomax.  8. Insomnia. Trazodone is available.  9. Metabolic syndrome monitoring. Lipid panel and TSH are normal. Hemoglobin A1c 6.3.    10. B12 deficiency. B12 level is low. We tried B12 injections but the patient refuses.  11. ECT Dr. Weber Cooks discussed with the patient possibility of ECT to treat her severe depression. Patient listened with some attention but made no comment. Was not ready to agreed to treatment plan. Still has limited insight.   12. Disposition. She will be discharged to home with her husband. She will follow up with  mental health professionals.   Orson Slick, MD 10/16/2016, 3:02 PM

## 2016-10-16 NOTE — Progress Notes (Signed)
Recreation Therapy Notes  Date: 01.15.18 Time: 9:30 am Location: Craft Room  Group Topic: Self-expression  Goal Area(s) Addresses:  Patient will identify one color per emotion listed on wheel. Patient will verbalize benefit of using art as a means of self-expression. Patient will verbalize one emotion experienced during group. Patient will be educated on other forms of self-expression.  Behavioral Response: Did not attend  Intervention: Emotion Wheel  Activity: Patients were given an Licensed conveyancer with 7 different emotions listed. Patients were instructed to pick a color for each emotion and fill it in on the worksheet.  Education: LRT educated patients on other forms of self-expression.  Education Outcome: Patient did not attend group.   Clinical Observations/Feedback: Patient did not attend group.  Leonette Monarch, LRT/CTRS 10/16/2016 10:03 AM

## 2016-10-16 NOTE — Plan of Care (Signed)
Problem: Coping: Goal: Ability to cope will improve Outcome: Not Progressing Patient not coping on unit at this time due to mental state being confused  And disoriented Technical brewer

## 2016-10-16 NOTE — Progress Notes (Signed)
10/16/16 [place].  Pt is on wait list.  They do not have an authorization number at this time. Lurline Idol, LCSW

## 2016-10-16 NOTE — Plan of Care (Signed)
Problem: Coping: Goal: Ability to cope will improve Outcome: Progressing Less irritable today   

## 2016-10-16 NOTE — Progress Notes (Signed)
D: Patient is alert and not oriented on the unit this shift. Patient not attended and actively participated in groups today. Patient denies suicidal ideation, homicidal ideation, auditory or visual hallucinations at the present time.  A: Scheduled medications are administered to patient as per MD orders. Emotional support and encouragement are provided. Patient is maintained on q.15 minute safety checks. Patient is informed to notify staff with questions or concerns. R: No adverse medication reactions are noted. Patient is cooperative with medication administration with reassurance   Patient is nonreceptive, irritable and not cooperative on the unit at this time. Patient does not interact  with others on the unit this shift. Patient contracts for safety at this time. Patient remains safe at this time.

## 2016-10-16 NOTE — Progress Notes (Signed)
Patient was less irritable today.But keep states "I can not do this"before taking medicine and food.Stayed in room most of the time.Denies suicidal or homicidal ideations and AV hallucinations.Did not attend groups.Compliant with medications with much encouragement.Support & encouragement given.

## 2016-10-16 NOTE — [facility] Group Notes (Signed)
[facility] Group Notes:  (Nursing/MHT/Case Management/Adjunct)  Date:  10/16/2016  Time:  5:31 PM  Type of Therapy:  Psychoeducational Skills  Participation Level:  Did Not Attend  Charise Killian 10/16/2016, 5:31 PM

## 2016-10-17 NOTE — Progress Notes (Signed)
Patient is less irritable today.Took all her medicines with applesauce and coke even if she says "I can not do this".Attended groups.Appetite good.Patient came to staff states that she is hungry in the afternoon.Patient maintains ADLs.Denies suicidal or homicidal ideations and AV hallucinations.Support & encouragement given.

## 2016-10-17 NOTE — [facility] Group Notes (Signed)
[facility] Group Notes:  (Nursing/MHT/Case Management/Adjunct)  Date:  10/17/2016  Time:  9:58 PM  Type of Therapy:  Psychoeducational Skills  Participation Level:  Did Not Attend  Participation Quality:  Nehemiah Settle 10/17/2016, 9:58 PM

## 2016-10-17 NOTE — Progress Notes (Signed)
[facility] MD Progress Note  10/17/2016 1:03 PM Kirsten Castro  MRN:  [id number]  Subjective:   Kirsten Castro has a history of depression, possibly bipolar disorder. She is floridly psychotic and refused medications twice in the past 24 hours. FORCED MEDICATION order since 10/04/2016.  10/08/2016 Pt seen, chart reviewed, discussed with nursing staff. Pt is reluctant to take med, but taking, had breakfast in am. States mood is " so so ", no ne w complaints. on FMP. Pt still psychotic, delusional, paranoid as before. Pt states she is anxious, and nothing will help, states she hears voices as before. Denies SI/HI.   10/09/2016. Kirsten Castro remains uncooperative and rude. She is on forced medication order. She composes taking any medications by mouth or by injection with an argument that she has not been eating any food here, therefore she should not be given any medications. She still wants me to check her body for "fluids". SW spoke with the husband who believes that all her problems started following bypass surgery with complications leading to confusion and behavioral changes. We will continue forced medication orders as the patient is still psychotic and unable to make educated decision about her treatment.  10/10/2016. Kirsten. Feliciana Castro met with the treatment team for the first time today. She is very anxious appearing, making strange noises, unable to sit still, fidgety, and slightly agitated. She however was able to have a brief conversation with her treatment team. She was telling us again that she is unable to take any medications and should not be given any because she is "full of water and doesn't eat anything here". She did take her medications today with encouragement. She does better when we offered her Coca-Cola to drink. She also signed her name on the form indicating that she participated in the meeting with no hesitation. She agreed to draw labs trhis morning. Unfortunately VPA level is only 20 as she  refused most of her doses. The patient is unconvinced that medications are helpful. Moreover she believes that it's "too late" now for treatment. This statement suggests a diagnosis of depression with psychotic features.  10/11/2016. Kirsten Castro appears slightly better today. She ate half of her breakfast and lunch. She properly introduced herself to a staff member this morning. On direkt questioning, she tells me that she is feeling "no good". She refused medications even with encouragement and a bribe with Coca cola. She complains of being full of water, unable to drink, or eat, unable to urinate. Indeed, she has a history of urinary retention. Per nursing, she wears a diaper but in spite of her complaints of "water coming from the front and the back", her diaper is frequently dry. She refuses Flomax, even though she knows why this was prescribed. She refuses blood pressure medications but her blood pressure has not been elevated. She adamantly refuses to see a Conservator, museum/gallery. I will offer it to her tomorrow again.  10/12/2016. There is no change. She did take some medications this morning but refused most of them. We continue Haldol, Zyprexa and Depakote for psychosis and mood stabilization. She refuses to see medical doctor.  10/13/2016. For the first time today Kirsten Castro told me that she is doing "so-so". She then immediately preceded to her regular amount about being full of water and not able to take medications. She admitted that she ate half of the breakfast today. She refuses oral medications most of the time but accepts Haldol and Zyprexa by injection. She has been  missing her Plavix, antihypertensives, and Flomax. Blood pressure is stable. Heart rate is slightly elevated.  Follow-up for the 13th. Patient seen. She seems a little confused. Didn't know where she was briefly. Still seems psychotic and agitated and refusing medication. Stays mostly isolated from others on the unit. No specific  new physical complaints  Follow-up the 14th. Patient is still talking about how she has to much water inside her body. She is eating however. Grooming herself better. Denies suicidal thoughts. Still has poor insight and appears very slow in her thinking.  10/16/2016 Kirsten Castro is feeling "bad" again. She is better groomed and dressed but did not respond well to my complements. Still complains of peer appetite but did not mentioned "water" with me today. Dr. Weber Cooks spoke about ECT with her over the weekend but the patient would not commit. She started attending groups. She is not as irritable as before.   10/17/2016. Kirsten Castro seems better today. She ate full dinner last night and accepted medications without any problems. She seems more pleasant. She took good shower. There are no somatic complaints. There is no more talk about "water". Sleep is good. No side effects from massive doses of antipsychotics.She came to group briefly this morning but left early.   Per nursing; D:Pt denies SI/HI/AVH. Pt is irritable, verbally aggressive, and unwilling to participate in treatment plan.Patient's affect is blunted, thoughts are disorganized. Pt appears less anxious, Patient is notinteracting with peers and staff appropriately.  A:Pt was offered support and encouragement. Pt was given scheduled medications. Pt was encouraged to attend groups. Q 15 minute checks wasdone for safety.  R:Pt dd not attend group. Pt is taking medication. Pt is not receptive to treatment. Safety maintained on unit will continue to closely monitor.   Principal Problem: Severe recurrent major depression with psychotic features [name]) Diagnosis:   Patient Active Problem List   Diagnosis Date Noted  . Severe recurrent major depression with psychotic features ([place]) [F33.3] 10/02/2016  . Benzodiazepine withdrawal ([place]) [F13.239] 10/02/2016  . 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 Abdominal pain [R10.9] 04/19/2016  . Sepsis  ([place]) [A41.9] 03/09/2016  . Urinary retention [R33.9] 03/09/2016  . CAD (coronary artery disease) [I25.10] 03/09/2016  . Frank hematuria [R31.0] 03/08/2016  . Temporary cerebral vascular dysfunction [G93.9] 02/03/2016  . Peripheral vascular disease ([place]) [I73.9] 02/03/2016  . BP (high blood pressure) [I10] 02/03/2016  . HLD (hyperlipidemia) [E78.5] 02/03/2016  . Clinical depression [F32.9] 02/03/2016  . Carotid artery narrowing [I65.29] 02/03/2016  . 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 Malignant neoplastic disease ([place]) [C80.1] 02/03/2016  . Anxiety [F41.9] 02/03/2016  . Abdominal pain, generalized [R10.84]   . Nausea with vomiting [R11.2]   . Gastritis [K29.70]   . Esophageal candidiasis ([place]) [B37.81]   . Noninfectious diarrhea [K52.9]   . Intractable pain [R52] 01/21/2016  . Upper abdominal pain [R10.10]   . Nausea and vomiting in adult [R11.2]   . Ischemic colitis ([place]) [K55.9] 01/01/2016  . Colitis [K52.9] 12/15/2015  . GI bleed [K92.2] 12/15/2015  . Acute inflammation of the pancreas [K85.90] 12/06/2015  . Vitamin B12 deficiency anemia [D51.9] 02/25/2015  . Absolute anemia [D64.9] 02/25/2015  . Carotid stenosis [I65.29] 02/10/2015  . Personal history of other specified conditions [Z87.898] 11/27/2014  . Osteomyelitis ([place]) [M86.9] 11/27/2014  . H/O coronary artery bypass surgery [Z95.1] 05/18/2014   Total Time spent with patient: 20 minutes  Past Psychiatric History: depression.  Past Medical History:  Past Medical History:  Diagnosis Date  . Anemia   . Anginal pain ([place])   . Anxiety   . 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  This was done at [facility]  . BP (high blood pressure) 02/03/2016  . Cancer ([place])    melanoma skin cancer  . Carotid stenosis 02/10/2015  . Chronic kidney disease    UTI  . Colitis 12/15/2015  . Collagen vascular disease ([place])   . COPD (chronic obstructive pulmonary disease) ([place])   . Coronary artery disease   . Depression   . Esophageal candidiasis ([place])   . Gastritis   . GERD (gastroesophageal reflux disease)   . GI bleed 12/15/2015  . Headache   . Hypertension   . Ischemic colitis ([place]) 01/01/2016   Overview:   SMA mesenteric ischemia now status post angioplasty by vascular of In stent restenosis.   Overview:  S?P SMA angioplasty   . Myocardial infarction   . Nausea and vomiting in adult   . Noninfectious diarrhea   . Peripheral vascular disease ([place]) 02/03/2016   Overview:  Follows with Dr. Delana Meyer    . Shortness of breath dyspnea    with exertion  . Stroke [facility])    TIA X 2  . Temporary cerebral vascular dysfunction 02/03/2016    Past Surgical History:  Procedure Laterality Date  . ABDOMINAL HYSTERECTOMY    . BACK SURGERY    . BREAST BIOPSY Left 6 2017   results not in yet  . COLONOSCOPY WITH PROPOFOL N/A 01/24/2016   Procedure: COLONOSCOPY WITH PROPOFOL;  Surgeon: Lucilla Lame, MD;  Location: [facility] ENDOSCOPY;  Service: Endoscopy;  Laterality: N/A;  . CORONARY ANGIOPLASTY    . CORONARY ARTERY BYPASS GRAFT    . ESOPHAGOGASTRODUODENOSCOPY (EGD) WITH PROPOFOL N/A 01/24/2016   Procedure: ESOPHAGOGASTRODUODENOSCOPY (EGD) WITH PROPOFOL;  Surgeon: Lucilla Lame, MD;  Location: [facility] ENDOSCOPY;  Service: Endoscopy;  Laterality: N/A;  . KYPHOPLASTY N/A 02/08/2016   Procedure: KYPHOPLASTY L1;  Surgeon: Hessie Knows, MD;  Location: [facility] ORS;  Service: Orthopedics;  Laterality: N/A;  . KYPHOPLASTY N/A 03/28/2016   Procedure: KYPHOPLASTY;  Surgeon: Hessie Knows, MD;  Location: [facility] ORS;  Service: Orthopedics;   Laterality: N/A;  . NOSE SURGERY     cancer removal  . OTHER SURGICAL HISTORY  Jul 11 2014   sternum removal  . PERIPHERAL VASCULAR CATHETERIZATION N/A 02/10/2015   Procedure: Carotid PTA/Stent Intervention;  Surgeon: Katha Cabal, MD;  Location: [place] CV LAB;  Service: Cardiovascular;  Laterality: N/A;  . PERIPHERAL VASCULAR CATHETERIZATION N/A 12/17/2015   Procedure: Visceral Angiography;  Surgeon: Katha Cabal, MD;  Location: [place] CV LAB;  Service: Cardiovascular;  Laterality: N/A;  . PERIPHERAL VASCULAR CATHETERIZATION N/A 12/17/2015   Procedure: Visceral Artery Intervention;  Surgeon: Katha Cabal, MD;  Location: [place] CV LAB;  Service: Cardiovascular;  Laterality: N/A;  . STENT PLACEMENT VASCULAR ([place] HX)     Family History:  Family History  Problem Relation Age of Onset  . Breast cancer Sister   . CAD Father   . CAD Brother  Family Psychiatric  History: see H&P. Social History:  History  Alcohol Use No     History  Drug Use No    Social History   Social History  . Marital status: Married    Spouse name: N/A  . Number of children: N/A  . Years of education: N/A   Social History Main Topics  . Smoking status: Former Smoker    Packs/day: 1.00    Types: Cigarettes    Quit date: 08/01/2012  . Smokeless tobacco: Never Used  . Alcohol use No  . Drug use: No  . Sexual activity: Yes   Other Topics Concern  . None   Social History Narrative  . None   Additional Social History:                         Sleep: Fair  Appetite:  Poor  Current Medications: Current Facility-Administered Medications  Medication Dose Route Frequency Provider Last Rate Last Dose  . acetaminophen (TYLENOL) tablet 650 mg  650 mg Oral Q6H PRN Gonzella Lex, MD   650 mg at 10/17/16 0817  . alum & mag hydroxide-simeth (MAALOX/MYLANTA) 200-200-20 MG/5ML suspension 30 mL  30 mL Oral Q4H PRN Gonzella Lex, MD      . amLODipine (NORVASC)  tablet 10 mg  10 mg Oral Daily Laiya Wisby B Tarez Bowns, MD   10 mg at 10/17/16 0818  . atorvastatin (LIPITOR) tablet 80 mg  80 mg Oral q1800 Gonzella Lex, MD   80 mg at 10/16/16 1718  . clopidogrel (PLAVIX) tablet 75 mg  75 mg Oral Daily Gonzella Lex, MD   75 mg at 10/17/16 0818  . divalproex (DEPAKOTE) DR tablet 500 mg  500 mg Oral Q12H Iviana Blasingame B Mirah Nevins, MD   500 mg at 10/17/16 0817  . fluvoxaMINE (LUVOX) tablet 200 mg  200 mg Oral QHS Mj Willis B Zahriah Roes, MD   200 mg at 10/15/16 2200  . furosemide (LASIX) tablet 20 mg  20 mg Oral Daily Oaklynn Stierwalt B Karmen Altamirano, MD   20 mg at 10/17/16 0818  . haloperidol lactate (HALDOL) injection 10 mg  10 mg Intramuscular Daily Aeneas Longsworth B Jonluke Cobbins, MD   10 mg at 10/13/16 0857   Or  . haloperidol (HALDOL) tablet 10 mg  10 mg Oral Daily Clovis Fredrickson, MD   10 mg at 10/17/16 0817  . magnesium hydroxide (MILK OF MAGNESIA) suspension 30 mL  30 mL Oral Daily PRN Gonzella Lex, MD      . mometasone-formoterol [facility]) 200-5 MCG/ACT inhaler 2 puff  2 puff Inhalation BID Gonzella Lex, MD   2 puff at 10/17/16 0818  . nitroGLYCERIN (NITROSTAT) SL tablet 0.4 mg  0.4 mg Sublingual Q5 min PRN Gonzella Lex, MD   0.4 mg at 10/14/16 2118  . OLANZapine (ZYPREXA) injection 15 mg  15 mg Intramuscular BID PRN Gonzella Lex, MD   15 mg at 10/10/16 2150  . OLANZapine zydis (ZYPREXA) disintegrating tablet 15 mg  15 mg Oral BID AC & HS Gaberial Cada B Yariel Ferraris, MD   15 mg at 10/17/16 0817  . potassium chloride SA (K-DUR,KLOR-CON) CR tablet 10 mEq  10 mEq Oral Daily Clovis Fredrickson, MD   10 mEq at 10/17/16 0818  . tamsulosin (FLOMAX) capsule 0.4 mg  0.4 mg Oral Daily Emmali Karow B Conrad Zajkowski, MD   0.4 mg at 10/17/16 0818  . traZODone (DESYREL) tablet 50 mg  50 mg Oral QHS John  T Clapacs, MD   50 mg at 10/15/16 2200    Lab Results:  No results found for this or any previous visit (from the past 48 hour(s)).  Blood Alcohol level:  Lab Results  Component Value Date   ETH  <5 10/02/2016   ETH <5 16/07/9603    Metabolic Disorder Labs: Lab Results  Component Value Date   HGBA1C 6.3 (H) 10/10/2016   MPG 134 10/10/2016   No results found for: PROLACTIN Lab Results  Component Value Date   CHOL 134 10/03/2016   TRIG 88 10/03/2016   HDL 53 10/03/2016   CHOLHDL 2.5 10/03/2016   VLDL 18 10/03/2016   LDLCALC 63 10/03/2016    Physical Findings: AIMS:  , ,  ,  ,    CIWA:    COWS:     Musculoskeletal: Strength & Muscle Tone: within normal limits Gait & Station: normal Patient leans: N/A  Psychiatric Specialty Exam: Physical Exam  Nursing note and vitals reviewed.   Review of Systems  Psychiatric/Behavioral: Positive for hallucinations and suicidal ideas. The patient is nervous/anxious.   All other systems reviewed and are negative.  [facility] MD Progress Note  10/17/2016 1:03 PM AYSIA LOWDER  MRN:  [id number]  Subjective:   Kirsten. Tigue has a history of depression, possibly bipolar disorder. She is floridly psychotic and refused medications twice in the past 24 hours. FORCED MEDICATION order since 10/04/2016.  10/08/2016 Pt seen, chart reviewed, discussed with nursing staff. Pt is reluctant to take med, but taking, had breakfast in am. States mood is " so so ", no ne w complaints. on FMP. Pt still psychotic, delusional, paranoid as before. Pt states she is anxious, and nothing will help, states she hears voices as before. Denies SI/HI.   10/09/2016. Kirsten Baynes remains uncooperative and rude. She is on forced medication order. She composes taking any medications by mouth or by injection with an argument that she has not been eating any food here, therefore she should not be given any medications. She still wants me to check her body for "fluids". SW spoke with the husband who believes that all her problems started following bypass surgery with complications leading to confusion and behavioral changes. We will continue forced medication orders as the  patient is still psychotic and unable to make educated decision about her treatment.  10/10/2016. Kirsten. Feliciana Castro met with the treatment team for the first time today. She is very anxious appearing, making strange noises, unable to sit still, fidgety, and slightly agitated. She however was able to have a brief conversation with her treatment team. She was telling us again that she is unable to take any medications and should not be given any because she is "full of water and doesn't eat anything here". She did take her medications today with encouragement. She does better when we offered her Coca-Cola to drink. She also signed her name on the form indicating that she participated in the meeting with no hesitation. She agreed to draw labs trhis morning. Unfortunately VPA level is only 20 as she refused most of her doses. The patient is unconvinced that medications are helpful. Moreover she believes that it's "too late" now for treatment. This statement suggests a diagnosis of depression with psychotic features.  10/11/2016. Kirsten. Zuluaga appears slightly better today. She ate half of her breakfast and lunch. She properly introduced herself to a staff member this morning. On direkt questioning, she tells me that she is feeling "no good". She refused  medications even with encouragement and a bribe with Coca cola. She complains of being full of water, unable to drink, or eat, unable to urinate. Indeed, she has a history of urinary retention. Per nursing, she wears a diaper but in spite of her complaints of "water coming from the front and the back", her diaper is frequently dry. She refuses Flomax, even though she knows why this was prescribed. She refuses blood pressure medications but her blood pressure has not been elevated. She adamantly refuses to see a Conservator, museum/gallery. I will offer it to her tomorrow again.  10/12/2016. There is no change. She did take some medications this morning but refused most of them. We  continue Haldol, Zyprexa and Depakote for psychosis and mood stabilization. She refuses to see medical doctor.  10/13/2016. For the first time today Kirsten. Schillo told me that she is doing "so-so". She then immediately preceded to her regular amount about being full of water and not able to take medications. She admitted that she ate half of the breakfast today. She refuses oral medications most of the time but accepts Haldol and Zyprexa by injection. She has been missing her Plavix, antihypertensives, and Flomax. Blood pressure is stable. Heart rate is slightly elevated.  Per nursing; D: Pt denies SI/HI/AVH but noted responding to internal stimuli. Patient's affect is flat, thoughts are disorganized.  Pt appears anxious, irritable and verbally aggressive and at times using profanities towards staff. Patient was advised to stop causing at staff. Patient is not interacting with peers and staff appropriately.  A: Pt was offered support and encouragement. Pt was given scheduled medications. Pt was encouraged to attend groups. Q 15 minute checks were done for safety.  R:Pt did not attend group. Pt is complaint with medication. Pt is not receptive to treatment. 15 minutes checks maintained on unit, will continue to monitor. .  Principal Problem: Severe recurrent major depression with psychotic features ([place]) Diagnosis:   Patient Active Problem List   Diagnosis Date Noted  . Severe recurrent major depression with psychotic features ([place]) [F33.3] 10/02/2016  . Benzodiazepine withdrawal ([place]) [F13.239] 10/02/2016  . 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 Abdominal pain [R10.9] 04/19/2016  . Sepsis ([place]) [A41.9] 03/09/2016  . Urinary retention [R33.9] 03/09/2016  . CAD (coronary artery disease) [I25.10] 03/09/2016  . Frank hematuria [R31.0] 03/08/2016  . Temporary cerebral vascular dysfunction [G93.9] 02/03/2016  . Peripheral vascular disease ([place]) [I73.9] 02/03/2016  . BP (high blood pressure) [I10]  02/03/2016  . HLD (hyperlipidemia) [E78.5] 02/03/2016  . Clinical depression [F32.9] 02/03/2016  . Carotid artery narrowing [I65.29] 02/03/2016  . 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 Malignant neoplastic disease ([place]) [C80.1] 02/03/2016  . Anxiety [F41.9] 02/03/2016  . Abdominal pain, generalized [R10.84]   . Nausea with vomiting [R11.2]   . Gastritis [K29.70]   . Esophageal candidiasis ([place]) [B37.81]   . Noninfectious diarrhea [K52.9]   . Intractable pain [R52] 01/21/2016  . Upper abdominal pain [R10.10]   . Nausea and vomiting in adult [R11.2]   . Ischemic colitis ([place]) [K55.9] 01/01/2016  . Colitis [K52.9] 12/15/2015  . GI bleed [K92.2] 12/15/2015  . Acute inflammation of the pancreas [K85.90] 12/06/2015  . Vitamin B12 deficiency anemia [D51.9] 02/25/2015  . Absolute anemia [D64.9] 02/25/2015  . Carotid stenosis [I65.29] 02/10/2015  . Personal history of other specified conditions [Z87.898] 11/27/2014  . Osteomyelitis ([place]) [M86.9] 11/27/2014  . H/O coronary artery bypass surgery [Z95.1] 05/18/2014   Total Time spent with patient:  20 minutes  Past Psychiatric History: depression.  Past Medical History:  Past Medical History:  Diagnosis Date  . Anemia   . Anginal pain ([place])   . Anxiety   . 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  This was done at [facility]  . BP (high blood pressure) 02/03/2016  . Cancer ([place])    melanoma skin cancer  . Carotid stenosis 02/10/2015  . Chronic kidney disease    UTI  . Colitis 12/15/2015  . Collagen vascular disease  ([place])   . COPD (chronic obstructive pulmonary disease) ([place])   . Coronary artery disease   . Depression   . Esophageal candidiasis ([place])   . Gastritis   . GERD (gastroesophageal reflux disease)   . GI bleed 12/15/2015  . Headache   . Hypertension   . Ischemic colitis ([place]) 01/01/2016   Overview:   SMA mesenteric ischemia now status post angioplasty by vascular of In stent restenosis.   Overview:  S?P SMA angioplasty   . Myocardial infarction   . Nausea and vomiting in adult   . Noninfectious diarrhea   . Peripheral vascular disease ([place]) 02/03/2016   Overview:  Follows with Dr. Delana Meyer    . Shortness of breath dyspnea    with exertion  . Stroke [facility])    TIA X 2  . Temporary cerebral vascular dysfunction 02/03/2016    Past Surgical History:  Procedure Laterality Date  . ABDOMINAL HYSTERECTOMY    . BACK SURGERY    . BREAST BIOPSY Left 6 2017   results not in yet  . COLONOSCOPY WITH PROPOFOL N/A 01/24/2016   Procedure: COLONOSCOPY WITH PROPOFOL;  Surgeon: Lucilla Lame, MD;  Location: [facility] ENDOSCOPY;  Service: Endoscopy;  Laterality: N/A;  . CORONARY ANGIOPLASTY    . CORONARY ARTERY BYPASS GRAFT    . ESOPHAGOGASTRODUODENOSCOPY (EGD) WITH PROPOFOL N/A 01/24/2016   Procedure: ESOPHAGOGASTRODUODENOSCOPY (EGD) WITH PROPOFOL;  Surgeon: Lucilla Lame, MD;  Location: [facility] ENDOSCOPY;  Service: Endoscopy;  Laterality: N/A;  . KYPHOPLASTY N/A 02/08/2016   Procedure: KYPHOPLASTY L1;  Surgeon: Hessie Knows, MD;  Location: [facility] ORS;  Service: Orthopedics;  Laterality: N/A;  . KYPHOPLASTY N/A 03/28/2016   Procedure: KYPHOPLASTY;  Surgeon: Hessie Knows, MD;  Location: [facility] ORS;  Service: Orthopedics;  Laterality: N/A;  . NOSE SURGERY     cancer removal  . OTHER SURGICAL HISTORY  Jul 11 2014   sternum removal  . PERIPHERAL VASCULAR CATHETERIZATION N/A 02/10/2015   Procedure: Carotid PTA/Stent Intervention;  Surgeon: Katha Cabal, MD;  Location: [place] CV LAB;  Service: Cardiovascular;   Laterality: N/A;  . PERIPHERAL VASCULAR CATHETERIZATION N/A 12/17/2015   Procedure: Visceral Angiography;  Surgeon: Katha Cabal, MD;  Location: [place] CV LAB;  Service: Cardiovascular;  Laterality: N/A;  . PERIPHERAL VASCULAR CATHETERIZATION N/A 12/17/2015   Procedure: Visceral Artery Intervention;  Surgeon: Katha Cabal, MD;  Location: [place] CV LAB;  Service: Cardiovascular;  Laterality: N/A;  . STENT PLACEMENT VASCULAR ([facility] HX)     Family History:  Family History  Problem Relation Age of Onset  .  Breast cancer Sister   . CAD Father   . CAD Brother    Family Psychiatric  History: see H&P. Social History:  History  Alcohol Use No     History  Drug Use No    Social History   Social History  . Marital status: Married    Spouse name: N/A  . Number of children: N/A  . Years of education: N/A   Social History Main Topics  . Smoking status: Former Smoker    Packs/day: 1.00    Types: Cigarettes    Quit date: 08/01/2012  . Smokeless tobacco: Never Used  . Alcohol use No  . Drug use: No  . Sexual activity: Yes   Other Topics Concern  . None   Social History Narrative  . None   Additional Social History:                         Sleep: Fair  Appetite:  Poor  Current Medications: Current Facility-Administered Medications  Medication Dose Route Frequency Provider Last Rate Last Dose  . acetaminophen (TYLENOL) tablet 650 mg  650 mg Oral Q6H PRN Gonzella Lex, MD   650 mg at 10/17/16 0817  . alum & mag hydroxide-simeth (MAALOX/MYLANTA) 200-200-20 MG/5ML suspension 30 mL  30 mL Oral Q4H PRN Gonzella Lex, MD      . amLODipine (NORVASC) tablet 10 mg  10 mg Oral Daily Amaris Garrette B Aman Bonet, MD   10 mg at 10/17/16 0818  . atorvastatin (LIPITOR) tablet 80 mg  80 mg Oral q1800 Gonzella Lex, MD   80 mg at 10/16/16 1718  . clopidogrel (PLAVIX) tablet 75 mg  75 mg Oral Daily Gonzella Lex, MD   75 mg at 10/17/16 0818  . divalproex (DEPAKOTE)  DR tablet 500 mg  500 mg Oral Q12H Batul Diego B Tereasa Yilmaz, MD   500 mg at 10/17/16 0817  . fluvoxaMINE (LUVOX) tablet 200 mg  200 mg Oral QHS Mohamad Bruso B Vicke Plotner, MD   200 mg at 10/15/16 2200  . furosemide (LASIX) tablet 20 mg  20 mg Oral Daily Farris Blash B Imunique Samad, MD   20 mg at 10/17/16 0818  . haloperidol lactate (HALDOL) injection 10 mg  10 mg Intramuscular Daily Rebecca Cairns B Brande Uncapher, MD   10 mg at 10/13/16 0857   Or  . haloperidol (HALDOL) tablet 10 mg  10 mg Oral Daily Clovis Fredrickson, MD   10 mg at 10/17/16 0817  . magnesium hydroxide (MILK OF MAGNESIA) suspension 30 mL  30 mL Oral Daily PRN Gonzella Lex, MD      . mometasone-formoterol [facility]) 200-5 MCG/ACT inhaler 2 puff  2 puff Inhalation BID Gonzella Lex, MD   2 puff at 10/17/16 0818  . nitroGLYCERIN (NITROSTAT) SL tablet 0.4 mg  0.4 mg Sublingual Q5 min PRN Gonzella Lex, MD   0.4 mg at 10/14/16 2118  . OLANZapine (ZYPREXA) injection 15 mg  15 mg Intramuscular BID PRN Gonzella Lex, MD   15 mg at 10/10/16 2150  . OLANZapine zydis (ZYPREXA) disintegrating tablet 15 mg  15 mg Oral BID AC & HS Royden Bulman B Irja Wheless, MD   15 mg at 10/17/16 0817  . potassium chloride SA (K-DUR,KLOR-CON) CR tablet 10 mEq  10 mEq Oral Daily Clovis Fredrickson, MD   10 mEq at 10/17/16 0818  . tamsulosin (FLOMAX) capsule 0.4 mg  0.4 mg Oral Daily Damaree Sargent B Dionne Knoop, MD   0.4 mg  at 10/17/16 0818  . traZODone (DESYREL) tablet 50 mg  50 mg Oral QHS Gonzella Lex, MD   50 mg at 10/15/16 2200    Lab Results:  No results found for this or any previous visit (from the past 48 hour(s)).  Blood Alcohol level:  Lab Results  Component Value Date   ETH <5 10/02/2016   ETH <5 68/34/1962    Metabolic Disorder Labs: Lab Results  Component Value Date   HGBA1C 6.3 (H) 10/10/2016   MPG 134 10/10/2016   No results found for: PROLACTIN Lab Results  Component Value Date   CHOL 134 10/03/2016   TRIG 88 10/03/2016   HDL 53 10/03/2016   CHOLHDL 2.5  10/03/2016   VLDL 18 10/03/2016   LDLCALC 63 10/03/2016    Physical Findings: AIMS:  , ,  ,  ,    CIWA:    COWS:     Musculoskeletal: Strength & Muscle Tone: within normal limits Gait & Station: normal Patient leans: N/A  Psychiatric Specialty Exam: Physical Exam  Nursing note and vitals reviewed.   Review of Systems  Psychiatric/Behavioral: Positive for hallucinations and suicidal ideas. The patient is nervous/anxious.   All other systems reviewed and are negative.   Blood pressure (!) 154/78, pulse 99, temperature 97.8 F (36.6 C), temperature source Oral, resp. rate 18, height 5' 1"  (1.549 m), weight 71.2 kg (157 lb), SpO2 98 %.Body mass index is 29.66 kg/m.  General Appearance: Disheveled  Eye Contact:  Good  Speech:  Pressured  Volume:  Increased  Mood:  Anxious  Affect:  Congruent, anxious  Thought Process:  Disorganized and Descriptions of Associations: Loose  Orientation:  Full (Time, Place, and Person)  Thought Content:  Illogical, Delusions, Paranoid Ideation and Rumination  Suicidal Thoughts:  denies  Homicidal Thoughts:  No  Memory:  Immediate;   Poor Recent;   Poor Remote;   Poor  Judgement:  Poor  Insight:  Lacking  Psychomotor Activity:  Increased  Concentration:  Concentration: Poor  Recall:  Poor  Fund of Knowledge:  Fair  Language:  Fair  Akathisia:  No  Handed:  Right  AIMS (if indicated):     Assets:  Communication Skills Desire for Improvement Financial Resources/Insurance Housing Resilience Social Support  ADL's:  Intact  Cognition:  WNL  Sleep:  Number of Hours: 8.15     Treatment Plan Summary: Daily contact with patient to assess and evaluate symptoms and progress in treatment and Medication management   Kirsten. Reader is a 56 year old female with history of depression admitted for worsening of her symptoms, paranoia and suicidal threats in the context of treatment noncompliance.  *   Agitation. Haldol, Ativan and Benadryl are  available per forced medication order.  1. Suicidal ideation. The patient is able to contract for safety in the hospital.  2. Mood, anxiety and psychosis. We started Zyprexa and Haldol for psychosis, Depakote for mood stabilization and Luvox for depression, anxiety, and trichotillomania. She is on forced medications now and refuses oral  meds. VPA level 20, ammonia low on 10/10/2016.   3. Hypertension. She is on lisinopril, furosemide, and potassium. Blood pressure is normal.   4. Coronary artery disease. She is on Lipitor and nitroglycerin. We will order EKG when the patient is calmer.  5. GERD. She is on Protonix.  6. COPD. She is on [place].  7. Urinary retention. She is on Flomax.  8. Insomnia. Trazodone is available.  9. Metabolic syndrome monitoring. Lipid panel and  TSH are normal. Hemoglobin A1c 6.3.    10. B12 deficiency. B12 level is low. We tried B12 injections but the patient refuses.  11. Disposition. She will be discharged to home with her husband. She will follow up with mental health professionals.  Orson Slick, MD 10/17/2016, 1:03 PM   Patient ID: Kirsten Castro, female   DOB: 07-02-1961, 56 y.o.   MRN: [id number] Patient ID: Kirsten Castro, female   DOB: Feb 08, 1961, 56 y.o.   MRN: [id number]  Blood pressure (!) 154/78, pulse 99, temperature 97.8 F (36.6 C), temperature source Oral, resp. rate 18, height 5' 1"  (1.549 m), weight 71.2 kg (157 lb), SpO2 98 %.Body mass index is 29.66 kg/m.  General Appearance: Disheveled  Eye Contact:  Good  Speech:  Pressured  Volume:  Increased  Mood:  Anxious  Affect:  Congruent, anxious  Thought Process:  Disorganized and Descriptions of Associations: Loose  Orientation:  Full (Time, Place, and Person)  Thought Content:  Illogical, Delusions, Paranoid Ideation and Rumination  Suicidal Thoughts:  denies  Homicidal Thoughts:  No  Memory:  Immediate;   Poor Recent;   Poor Remote;   Poor  Judgement:  Poor   Insight:  Lacking  Psychomotor Activity:  Increased  Concentration:  Concentration: Poor  Recall:  Poor  Fund of Knowledge:  Fair  Language:  Fair  Akathisia:  No  Handed:  Right  AIMS (if indicated):     Assets:  Communication Skills Desire for Improvement Financial Resources/Insurance Housing Resilience Social Support  ADL's:  Intact  Cognition:  WNL  Sleep:  Number of Hours: 8.15     Treatment Plan Summary: Daily contact with patient to assess and evaluate symptoms and progress in treatment and Medication management   Kirsten. Kirsten Castro is a 57 year old female with history of depression admitted for worsening of her symptoms, paranoia and suicidal threats in the context of treatment noncompliance.  *   Agitation. Haldol, Ativan and Benadryl are available per forced medication order.  1. Suicidal ideation. The patient is able to contract for safety in the hospital.  2. Mood, anxiety and psychosis. We started Zyprexa and Haldol for psychosis, Depakote for mood stabilization and Luvox for depression, anxiety, and trichotillomania. She is on forced medications now and refuses oral  meds. VPA level 20, ammonia low on 10/10/2016.   3. Hypertension. She is on lisinopril, furosemide, and potassium. Blood pressure is normal.   4. Coronary artery disease. She is on Lipitor and nitroglycerin. We will order EKG when the patient is calmer.  5. GERD. She is on Protonix.  6. COPD. She is on [place].  7. Urinary retention. She is on Flomax.  8. Insomnia. Trazodone is available.  9. Metabolic syndrome monitoring. Lipid panel and TSH are normal. Hemoglobin A1c 6.3.    10. B12 deficiency. B12 level is low. We tried B12 injections but the patient refuses.  11. ECT Dr. Weber Cooks discussed with the patient possibility of ECT to treat her severe depression. Patient listened with some attention but made no comment. Was not ready to agreed to treatment plan. Still has limited insight.    12. Disposition. Family meeting tomorrow. She will be discharged to home with her husband. She will follow up with mental health professionals.   Orson Slick, MD 10/17/2016, 1:03 PM

## 2016-10-17 NOTE — Progress Notes (Signed)
  D: Pt denies SI/HI/AVH. Pt is irritable, verbally aggressive, and unwilling to participate in treatment plan.Patient's affect is blunted, thoughts are disorganized. Pt appears less anxious, Patient is not interacting with peers and staff appropriately.  A: Pt was offered support and encouragement. Pt was given scheduled medications. Pt was encouraged to attend groups. Q 15 minute checks was done for safety.  R:Pt dd not attend group. Pt is taking medication. Pt is not receptive to treatment. Safety maintained on unit will continue to closely monitor.

## 2016-10-17 NOTE — Progress Notes (Signed)
D: Pt denies SI/HI/AVH. Pt is pleasant and cooperative this evening. Pt becomes very anxious when you talk about her taking her medications. Pt becomes visibly anxious with rapid breathing and complaints that she can't do it. Patients medications that could be crushed were added to applesauce and pt took with out complication and drank a coke afterwards. Pt did isolate to room this evening.   A: Pt was offered support and encouragement. Pt was given scheduled medications. Pt was encourage to attend groups. Q 15 minute checks were done for safety.   R:Pt attends groups and interacts well with peers and staff. Pt is taking medication. Pt has no complaints at this time.Pt receptive to treatment and safety maintained on unit.

## 2016-10-17 NOTE — Plan of Care (Signed)
Problem: Safety: Goal: Ability to remain free from injury will improve Outcome: Progressing Pt safe on the unit at this time   

## 2016-10-18 NOTE — Progress Notes (Signed)
Denies SI/HI/AVH.  Medication compliant today.  Stays to self.  Support and encouragement offered, safety maintained.

## 2016-10-18 NOTE — [facility] Group Notes (Signed)
[facility] Group Notes:  (Nursing/MHT/Case Management/Adjunct)  Date:  10/18/2016  Time:  5:16 PM  Type of Therapy:  Psychoeducational Skills  Participation Level:  Did Not Attend   Charise Killian 10/18/2016, 5:16 PM

## 2016-10-18 NOTE — Progress Notes (Addendum)
2030: Patient in room, in bed. Alert and oriented and responding when needed. Confused at times. Denies SI/HI. Isolative in room. Was encouraged to join peers in the dayroom. Patient stated she prefers to be in room. Staff continue to monitor and to provide support. Will continue to assess.  2200: Patient in room awake. Guarded and irritable, not willing to get out of bed. Was encouraged to come to to the medication room. Took medications per encouragements. Thought process: guarded. Avoiding. Endorsing poor judgement. Staff continue to provide support and encouragements. Safety precautions maintained.  0600: Patient remained asleep. Got up for vital signs: Bizarre, confused and pacing. Medication were suggested to her: Patient refused. Safety precautions reinforced.

## 2016-10-18 NOTE — Progress Notes (Signed)
Recreation Therapy Notes  Date: 01.17.18 Time: 9:30 am Location: Craft Room  Group Topic: Self-esteem  Goal Area(s) Addresses:  Patient will write at least one positive trait about self. Patient will verbalize benefit of having a healthy self-esteem.  Behavioral Response: Did not attend  Intervention: I Am  Activity: Patients were given a worksheet with the letter I on it and were instructed to write as many positive traits about themselves inside the letter.  Education: LRT educated patients on ways they can increase their self-esteem.  Education Outcome: Patient did not attend group.  Clinical Observations/Feedback: Patient did not attend group.  Leonette Monarch, LRT/CTRS 10/18/2016 10:25 AM

## 2016-10-18 NOTE — Progress Notes (Signed)
[facility] MD Progress Note  10/18/2016 11:44 AM Kirsten Castro  MRN:  [id number]  Subjective:   Kirsten Castro has a history of depression, possibly bipolar disorder. She is floridly psychotic and refused medications twice in the past 24 hours. FORCED MEDICATION order since 10/04/2016.  10/08/2016 Pt seen, chart reviewed, discussed with nursing staff. Pt is reluctant to take med, but taking, had breakfast in am. States mood is " so so ", no ne w complaints. on FMP. Pt still psychotic, delusional, paranoid as before. Pt states she is anxious, and nothing will help, states she hears voices as before. Denies SI/HI.   10/09/2016. Kirsten Castro remains uncooperative and rude. She is on forced medication order. She composes taking any medications by mouth or by injection with an argument that she has not been eating any food here, therefore she should not be given any medications. She still wants me to check her body for "fluids". SW spoke with the husband who believes that all her problems started following bypass surgery with complications leading to confusion and behavioral changes. We will continue forced medication orders as the patient is still psychotic and unable to make educated decision about her treatment.  10/10/2016. Kirsten Castro met with the treatment team for the first time today. She is very anxious appearing, making strange noises, unable to sit still, fidgety, and slightly agitated. She however was able to have a brief conversation with her treatment team. She was telling us again that she is unable to take any medications and should not be given any because she is "full of water and doesn't eat anything here". She did take her medications today with encouragement. She does better when we offered her Coca-Cola to drink. She also signed her name on the form indicating that she participated in the meeting with no hesitation. She agreed to draw labs trhis morning. Unfortunately VPA level is only 20 as she  refused most of her doses. The patient is unconvinced that medications are helpful. Moreover she believes that it's "too late" now for treatment. This statement suggests a diagnosis of depression with psychotic features.  10/11/2016. Kirsten Castro appears slightly better today. She ate half of her breakfast and lunch. She properly introduced herself to a staff member this morning. On direkt questioning, she tells me that she is feeling "no good". She refused medications even with encouragement and a bribe with Coca cola. She complains of being full of water, unable to drink, or eat, unable to urinate. Indeed, she has a history of urinary retention. Per nursing, she wears a diaper but in spite of her complaints of "water coming from the front and the back", her diaper is frequently dry. She refuses Flomax, even though she knows why this was prescribed. She refuses blood pressure medications but her blood pressure has not been elevated. She adamantly refuses to see a Conservator, museum/gallery. I will offer it to her tomorrow again.  10/12/2016. There is no change. She did take some medications this morning but refused most of them. We continue Haldol, Zyprexa and Depakote for psychosis and mood stabilization. She refuses to see medical doctor.  10/13/2016. For the first time today Kirsten. Coven told me that she is doing "so-so". She then immediately preceded to her regular amount about being full of water and not able to take medications. She admitted that she ate half of the breakfast today. She refuses oral medications most of the time but accepts Haldol and Zyprexa by injection. She has been  missing her Plavix, antihypertensives, and Flomax. Blood pressure is stable. Heart rate is slightly elevated.  Follow-up for the 13th. Patient seen. She seems a little confused. Didn't know where she was briefly. Still seems psychotic and agitated and refusing medication. Stays mostly isolated from others on the unit. No specific  new physical complaints  Follow-up the 14th. Patient is still talking about how she has to much water inside her body. She is eating however. Grooming herself better. Denies suicidal thoughts. Still has poor insight and appears very slow in her thinking.  10/16/2016 Kirsten Castro is feeling "bad" again. She is better groomed and dressed but did not respond well to my complements. Still complains of peer appetite but did not mentioned "water" with me today. Dr. Weber Cooks spoke about ECT with her over the weekend but the patient would not commit. She started attending groups. She is not as irritable as before.   10/17/2016. Kirsten Castro seems better today. She ate full dinner last night and accepted medications without any problems. She seems more pleasant. She took good shower. There are no somatic complaints. There is no more talk about "water". Sleep is good. No side effects from massive doses of antipsychotics.She came to group briefly this morning but left early.   10/18/2016. Kirsten Castro has been taking her medications by mouth with some encouragement for several days now. She sleeps well. Her appetite has improved and she consumes for portions now. She sleeps at night. She is more out of her room but does not participate in groups. There are no somatic complaints. She is looking forward to discharge. Family meeting with her husband is scheduled tomorrow at 1:30.  Per nursing; D: Pt denies SI/HI/AVH. Pt is pleasant and cooperative this evening. Pt becomes very anxious when you talk about her taking her medications. Pt becomes visibly anxious with rapid breathing and complaints that she can't do it. Patients medications that could be crushed were added to applesauce and pt took with out complication and drank a coke afterwards. Pt did isolate to room this evening.   A: Pt was offered support and encouragement. Pt was given scheduled medications. Pt was encourage to attend groups. Q 15 minute checks were  done for safety.   R:Pt attends groups and interacts well with peers and staff. Pt is taking medication. Pt has no complaints at this time.Pt receptive to treatment and safety maintained on unit. Principal Problem: Severe recurrent major depression with psychotic features [facility]) Diagnosis:   Patient Active Problem List   Diagnosis Date Noted  . Severe recurrent major depression with psychotic features ([place]) [F33.3] 10/02/2016  . Benzodiazepine withdrawal ([place]) [F13.239] 10/02/2016  . 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 Abdominal pain [R10.9] 04/19/2016  . Sepsis ([place]) [A41.9] 03/09/2016  . Urinary retention [R33.9] 03/09/2016  . CAD (coronary artery disease) [I25.10] 03/09/2016  . Frank hematuria [R31.0] 03/08/2016  . Temporary cerebral vascular dysfunction [G93.9] 02/03/2016  . Peripheral vascular disease ([place]) [I73.9] 02/03/2016  . BP (high blood pressure) [I10] 02/03/2016  . HLD (hyperlipidemia) [E78.5] 02/03/2016  . Clinical depression [F32.9] 02/03/2016  . Carotid artery narrowing [I65.29] 02/03/2016  . 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 Malignant neoplastic disease ([place]) [C80.1] 02/03/2016  . Anxiety [F41.9] 02/03/2016  . Abdominal pain, generalized [R10.84]   . Nausea with vomiting [R11.2]   . Gastritis [K29.70]   . Esophageal candidiasis ([place]) [B37.81]   . Noninfectious diarrhea [K52.9]   . Intractable pain [R52] 01/21/2016  . Upper  abdominal pain [R10.10]   . Nausea and vomiting in adult [R11.2]   . Ischemic colitis ([place]) [K55.9] 01/01/2016  . Colitis [K52.9] 12/15/2015  . GI bleed [K92.2] 12/15/2015  . Acute inflammation of the pancreas [K85.90] 12/06/2015  . Vitamin B12 deficiency anemia [D51.9] 02/25/2015  . Absolute anemia [D64.9] 02/25/2015  . Carotid stenosis [I65.29] 02/10/2015  . Personal history of other specified conditions [Z87.898] 11/27/2014  . Osteomyelitis ([place]) [M86.9] 11/27/2014  . H/O coronary artery  bypass surgery [Z95.1] 05/18/2014   Total Time spent with patient: 20 minutes  Past Psychiatric History: depression.  Past Medical History:  Past Medical History:  Diagnosis Date  . Anemia   . Anginal pain ([place])   . Anxiety   . 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  This was done [facility]  . BP (high blood pressure) 02/03/2016  . Cancer ([place])    melanoma skin cancer  . Carotid stenosis 02/10/2015  . Chronic kidney disease    UTI  . Colitis 12/15/2015  . Collagen vascular disease ([place])   . COPD (chronic obstructive pulmonary disease) ([place])   . Coronary artery disease   . Depression   . Esophageal candidiasis ([place])   . Gastritis   . GERD (gastroesophageal reflux disease)   . GI bleed 12/15/2015  . Headache   . Hypertension   . Ischemic colitis ([place]) 01/01/2016   Overview:   SMA mesenteric ischemia now status post angioplasty by vascular of In stent restenosis.   Overview:  S?P SMA angioplasty   . Myocardial infarction   . Nausea and vomiting in adult   . Noninfectious diarrhea   . Peripheral vascular disease ([place]) 02/03/2016   Overview:  Follows with Dr. Delana Meyer    . Shortness of breath dyspnea    with exertion  . Stroke [facility])    TIA X 2  . Temporary cerebral vascular dysfunction 02/03/2016    Past Surgical History:  Procedure Laterality Date  . ABDOMINAL HYSTERECTOMY    . BACK SURGERY    . BREAST BIOPSY Left 6 2017   results not in yet  . COLONOSCOPY WITH PROPOFOL N/A 01/24/2016   Procedure: COLONOSCOPY WITH PROPOFOL;  Surgeon: Lucilla Lame, MD;  Location: [facility] ENDOSCOPY;  Service:  Endoscopy;  Laterality: N/A;  . CORONARY ANGIOPLASTY    . CORONARY ARTERY BYPASS GRAFT    . ESOPHAGOGASTRODUODENOSCOPY (EGD) WITH PROPOFOL N/A 01/24/2016   Procedure: ESOPHAGOGASTRODUODENOSCOPY (EGD) WITH PROPOFOL;  Surgeon: Lucilla Lame, MD;  Location: [facility] ENDOSCOPY;  Service: Endoscopy;  Laterality: N/A;  . KYPHOPLASTY N/A 02/08/2016   Procedure: KYPHOPLASTY L1;  Surgeon: Hessie Knows, MD;  Location: [facility] ORS;  Service: Orthopedics;  Laterality: N/A;  . KYPHOPLASTY N/A 03/28/2016   Procedure: KYPHOPLASTY;  Surgeon: Hessie Knows, MD;  Location: [facility] ORS;  Service: Orthopedics;  Laterality: N/A;  . NOSE SURGERY     cancer removal  . OTHER SURGICAL HISTORY  Jul 11 2014   sternum removal  . PERIPHERAL VASCULAR CATHETERIZATION N/A 02/10/2015   Procedure: Carotid PTA/Stent Intervention;  Surgeon: Katha Cabal, MD;  Location: [facility]  INVASIVE CV LAB;  Service: Cardiovascular;  Laterality: N/A;  . PERIPHERAL VASCULAR CATHETERIZATION N/A 12/17/2015   Procedure: Visceral Angiography;  Surgeon: Katha Cabal, MD;  Location: [place] CV LAB;  Service: Cardiovascular;  Laterality: N/A;  . PERIPHERAL VASCULAR CATHETERIZATION N/A 12/17/2015   Procedure: Visceral Artery Intervention;  Surgeon: Katha Cabal, MD;  Location: [place] CV LAB;  Service: Cardiovascular;  Laterality: N/A;  . STENT PLACEMENT VASCULAR ([place] [facility])     Family History:  Family History  Problem Relation Age of Onset  . Breast cancer Sister   . CAD Father   . CAD Brother    Family Psychiatric  History: see H&P. Social History:  History  Alcohol Use No     History  Drug Use No    Social History   Social History  . Marital status: Married    Spouse name: N/A  . Number of children: N/A  . Years of education: N/A   Social History Main Topics  . Smoking status: Former Smoker    Packs/day: 1.00    Types: Cigarettes    Quit date: 08/01/2012  . Smokeless tobacco: Never Used  . Alcohol use No  . Drug use:  No  . Sexual activity: Yes   Other Topics Concern  . None   Social History Narrative  . None   Additional Social History:                         Sleep: Fair  Appetite:  Poor  Current Medications: Current Facility-Administered Medications  Medication Dose Route Frequency Provider Last Rate Last Dose  . acetaminophen (TYLENOL) tablet 650 mg  650 mg Oral Q6H PRN Gonzella Lex, MD   650 mg at 10/17/16 0817  . alum & mag hydroxide-simeth (MAALOX/MYLANTA) 200-200-20 MG/5ML suspension 30 mL  30 mL Oral Q4H PRN Gonzella Lex, MD      . amLODipine (NORVASC) tablet 10 mg  10 mg Oral Daily Clovis Fredrickson, MD   10 mg at 10/18/16 0817  . atorvastatin (LIPITOR) tablet 80 mg  80 mg Oral q1800 Gonzella Lex, MD   80 mg at 10/16/16 1718  . clopidogrel (PLAVIX) tablet 75 mg  75 mg Oral Daily Gonzella Lex, MD   75 mg at 10/18/16 0817  . divalproex (DEPAKOTE) DR tablet 500 mg  500 mg Oral Q12H Berneice Zettlemoyer B Santosha Jividen, MD   500 mg at 10/18/16 0817  . fluvoxaMINE (LUVOX) tablet 200 mg  200 mg Oral QHS Nastassia Bazaldua B Antonique Langford, MD   200 mg at 10/17/16 2135  . furosemide (LASIX) tablet 20 mg  20 mg Oral Daily Cyruss Arata B Angelyna Henderson, MD   20 mg at 10/18/16 0817  . haloperidol lactate (HALDOL) injection 10 mg  10 mg Intramuscular Daily Verbena Boeding B Armaan Pond, MD   10 mg at 10/13/16 0857   Or  . haloperidol (HALDOL) tablet 10 mg  10 mg Oral Daily Clovis Fredrickson, MD   10 mg at 10/18/16 0817  . magnesium hydroxide (MILK OF MAGNESIA) suspension 30 mL  30 mL Oral Daily PRN Gonzella Lex, MD      . mometasone-formoterol [facility]) 200-5 MCG/ACT inhaler 2 puff  2 puff Inhalation BID Gonzella Lex, MD   2 puff at 10/17/16 2136  . nitroGLYCERIN (NITROSTAT) SL tablet 0.4 mg  0.4 mg Sublingual Q5 min PRN Gonzella Lex, MD   0.4 mg at 10/14/16 2118  . OLANZapine (ZYPREXA)  injection 15 mg  15 mg Intramuscular BID PRN Gonzella Lex, MD   15 mg at 10/10/16 2150  . OLANZapine zydis (ZYPREXA)  disintegrating tablet 15 mg  15 mg Oral BID AC & HS Dianne Bady B Sebastin Perlmutter, MD   15 mg at 10/18/16 0817  . potassium chloride SA (K-DUR,KLOR-CON) CR tablet 10 mEq  10 mEq Oral Daily Clovis Fredrickson, MD   10 mEq at 10/18/16 0818  . tamsulosin (FLOMAX) capsule 0.4 mg  0.4 mg Oral Daily Sameera Betton B Kentley Blyden, MD   0.4 mg at 10/18/16 0818  . traZODone (DESYREL) tablet 50 mg  50 mg Oral QHS Gonzella Lex, MD   50 mg at 10/17/16 2135    Lab Results:  No results found for this or any previous visit (from the past 48 hour(s)).  Blood Alcohol level:  Lab Results  Component Value Date   ETH <5 10/02/2016   ETH <5 28/78/6767    Metabolic Disorder Labs: Lab Results  Component Value Date   HGBA1C 6.3 (H) 10/10/2016   MPG 134 10/10/2016   No results found for: PROLACTIN Lab Results  Component Value Date   CHOL 134 10/03/2016   TRIG 88 10/03/2016   HDL 53 10/03/2016   CHOLHDL 2.5 10/03/2016   VLDL 18 10/03/2016   LDLCALC 63 10/03/2016    Physical Findings: AIMS:  , ,  ,  ,    CIWA:    COWS:     Musculoskeletal: Strength & Muscle Tone: within normal limits Gait & Station: normal Patient leans: N/A  Psychiatric Specialty Exam: Physical Exam  Nursing note and vitals reviewed.   Review of Systems  Psychiatric/Behavioral: Positive for hallucinations and suicidal ideas. The patient is nervous/anxious.   All other systems reviewed and are negative.  [facility] MD Progress Note  10/18/2016 11:44 AM Kirsten Castro  MRN:  [id number]  Subjective:   Kirsten Castro has a history of depression, possibly bipolar disorder. She is floridly psychotic and refused medications twice in the past 24 hours. FORCED MEDICATION order since 10/04/2016.  10/08/2016 Pt seen, chart reviewed, discussed with nursing staff. Pt is reluctant to take med, but taking, had breakfast in am. States mood is " so so ", no ne w complaints. on FMP. Pt still psychotic, delusional, paranoid as before. Pt states she is  anxious, and nothing will help, states she hears voices as before. Denies SI/HI.   10/09/2016. Kirsten Castro remains uncooperative and rude. She is on forced medication order. She composes taking any medications by mouth or by injection with an argument that she has not been eating any food here, therefore she should not be given any medications. She still wants me to check her body for "fluids". SW spoke with the husband who believes that all her problems started following bypass surgery with complications leading to confusion and behavioral changes. We will continue forced medication orders as the patient is still psychotic and unable to make educated decision about her treatment.  10/10/2016. Kirsten Castro met with the treatment team for the first time today. She is very anxious appearing, making strange noises, unable to sit still, fidgety, and slightly agitated. She however was able to have a brief conversation with her treatment team. She was telling us again that she is unable to take any medications and should not be given any because she is "full of water and doesn't eat anything here". She did take her medications today with encouragement. She does better when we offered  her Coca-Cola to drink. She also signed her name on the form indicating that she participated in the meeting with no hesitation. She agreed to draw labs trhis morning. Unfortunately VPA level is only 20 as she refused most of her doses. The patient is unconvinced that medications are helpful. Moreover she believes that it's "too late" now for treatment. This statement suggests a diagnosis of depression with psychotic features.  10/11/2016. Kirsten Castro appears slightly better today. She ate half of her breakfast and lunch. She properly introduced herself to a staff member this morning. On direkt questioning, she tells me that she is feeling "no good". She refused medications even with encouragement and a bribe with Coca cola. She complains  of being full of water, unable to drink, or eat, unable to urinate. Indeed, she has a history of urinary retention. Per nursing, she wears a diaper but in spite of her complaints of "water coming from the front and the back", her diaper is frequently dry. She refuses Flomax, even though she knows why this was prescribed. She refuses blood pressure medications but her blood pressure has not been elevated. She adamantly refuses to see a Conservator, museum/gallery. I will offer it to her tomorrow again.  10/12/2016. There is no change. She did take some medications this morning but refused most of them. We continue Haldol, Zyprexa and Depakote for psychosis and mood stabilization. She refuses to see medical doctor.  10/13/2016. For the first time today Kirsten Castro told me that she is doing "so-so". She then immediately preceded to her regular amount about being full of water and not able to take medications. She admitted that she ate half of the breakfast today. She refuses oral medications most of the time but accepts Haldol and Zyprexa by injection. She has been missing her Plavix, antihypertensives, and Flomax. Blood pressure is stable. Heart rate is slightly elevated.  Per nursing; D: Pt denies SI/HI/AVH but noted responding to internal stimuli. Patient's affect is flat, thoughts are disorganized.  Pt appears anxious, irritable and verbally aggressive and at times using profanities towards staff. Patient was advised to stop causing at staff. Patient is not interacting with peers and staff appropriately.  A: Pt was offered support and encouragement. Pt was given scheduled medications. Pt was encouraged to attend groups. Q 15 minute checks were done for safety.  R:Pt did not attend group. Pt is complaint with medication. Pt is not receptive to treatment. 15 minutes checks maintained on unit, will continue to monitor. .  Principal Problem: Severe recurrent major depression with psychotic features ([place]) Diagnosis:    Patient Active Problem List   Diagnosis Date Noted  . Severe recurrent major depression with psychotic features ([place]) [F33.3] 10/02/2016  . Benzodiazepine withdrawal ([place]) [F13.239] 10/02/2016  . 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 Abdominal pain [R10.9] 04/19/2016  . Sepsis ([place]) [A41.9] 03/09/2016  . Urinary retention [R33.9] 03/09/2016  . CAD (coronary artery disease) [I25.10] 03/09/2016  . Frank hematuria [R31.0] 03/08/2016  . Temporary cerebral vascular dysfunction [G93.9] 02/03/2016  . Peripheral vascular disease ([place]) [I73.9] 02/03/2016  . BP (high blood pressure) [I10] 02/03/2016  . HLD (hyperlipidemia) [E78.5] 02/03/2016  . Clinical depression [F32.9] 02/03/2016  . Carotid artery narrowing [I65.29] 02/03/2016  . 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 Malignant neoplastic disease ([place]) [C80.1] 02/03/2016  . Anxiety [F41.9] 02/03/2016  . Abdominal pain, generalized [R10.84]   . Nausea with vomiting [R11.2]   . Gastritis [K29.70]   . Esophageal candidiasis (  [place]) [B37.81]   . Noninfectious diarrhea [K52.9]   . Intractable pain [R52] 01/21/2016  . Upper abdominal pain [R10.10]   . Nausea and vomiting in adult [R11.2]   . Ischemic colitis ([place]) [K55.9] 01/01/2016  . Colitis [K52.9] 12/15/2015  . GI bleed [K92.2] 12/15/2015  . Acute inflammation of the pancreas [K85.90] 12/06/2015  . Vitamin B12 deficiency anemia [D51.9] 02/25/2015  . Absolute anemia [D64.9] 02/25/2015  . Carotid stenosis [I65.29] 02/10/2015  . Personal history of other specified conditions [Z87.898] 11/27/2014  . Osteomyelitis ([place]) [M86.9] 11/27/2014  . H/O coronary artery bypass surgery [Z95.1] 05/18/2014   Total Time spent with patient: 20 minutes  Past Psychiatric History: depression.  Past Medical History:  Past Medical History:  Diagnosis Date  . Anemia   . Anginal pain ([place])   . Anxiety   . 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  This was done at [facility]  . BP (high blood pressure) 02/03/2016  . Cancer ([place])    melanoma skin cancer  . Carotid stenosis 02/10/2015  . Chronic kidney disease    UTI  . Colitis 12/15/2015  . Collagen vascular disease ([place])   . COPD (chronic obstructive pulmonary disease) ([place])   . Coronary artery disease   . Depression   . Esophageal candidiasis ([place])   . Gastritis   . GERD (gastroesophageal reflux disease)   . GI bleed 12/15/2015  . Headache   . Hypertension   . Ischemic colitis ([place]) 01/01/2016   Overview:   SMA mesenteric ischemia now status post angioplasty by vascular of In stent restenosis.   Overview:  S?P SMA angioplasty   . Myocardial infarction   . Nausea and vomiting in adult   . Noninfectious diarrhea   . Peripheral vascular disease ([place]) 02/03/2016   Overview:  Follows with Dr. Delana Meyer    . Shortness of breath dyspnea    with exertion  . Stroke [facility])    TIA X 2  . Temporary cerebral vascular dysfunction 02/03/2016    Past Surgical History:  Procedure Laterality Date  . ABDOMINAL HYSTERECTOMY    . BACK SURGERY    . BREAST BIOPSY Left 6 2017   results not in yet  . COLONOSCOPY WITH PROPOFOL N/A 01/24/2016   Procedure: COLONOSCOPY WITH PROPOFOL;  Surgeon: Lucilla Lame, MD;  Location: [facility] ENDOSCOPY;  Service: Endoscopy;  Laterality: N/A;  . CORONARY ANGIOPLASTY    . CORONARY ARTERY BYPASS GRAFT    . ESOPHAGOGASTRODUODENOSCOPY (EGD) WITH PROPOFOL N/A 01/24/2016   Procedure: ESOPHAGOGASTRODUODENOSCOPY (EGD) WITH PROPOFOL;  Surgeon: Lucilla Lame, MD;  Location: [facility] ENDOSCOPY;  Service: Endoscopy;   Laterality: N/A;  . KYPHOPLASTY N/A 02/08/2016   Procedure: KYPHOPLASTY L1;  Surgeon: Hessie Knows, MD;  Location: [facility] ORS;  Service: Orthopedics;  Laterality: N/A;  . KYPHOPLASTY N/A 03/28/2016   Procedure: KYPHOPLASTY;  Surgeon: Hessie Knows, MD;  Location: [facility] ORS;  Service: Orthopedics;  Laterality: N/A;  . NOSE SURGERY     cancer removal  . OTHER SURGICAL HISTORY  Jul 11 2014   sternum removal  . PERIPHERAL VASCULAR  CATHETERIZATION N/A 02/10/2015   Procedure: Carotid PTA/Stent Intervention;  Surgeon: Katha Cabal, MD;  Location: [facility];  Service: Cardiovascular;  Laterality: N/A;  . PERIPHERAL VASCULAR CATHETERIZATION N/A 12/17/2015   Procedure: Visceral Angiography;  Surgeon: Katha Cabal, MD;  Location: [facility];  Service: Cardiovascular;  Laterality: N/A;  . PERIPHERAL VASCULAR CATHETERIZATION N/A 12/17/2015   Procedure: Visceral Artery Intervention;  Surgeon: Katha Cabal, MD;  Location: [facility];  Service: Cardiovascular;  Laterality: N/A;  . STENT PLACEMENT VASCULAR ([place] HX)     Family History:  Family History  Problem Relation Age of Onset  . Breast cancer Sister   . CAD Father   . CAD Brother    Family Psychiatric  History: see H&P. Social History:  History  Alcohol Use No     History  Drug Use No    Social History   Social History  . Marital status: Married    Spouse name: N/A  . Number of children: N/A  . Years of education: N/A   Social History Main Topics  . Smoking status: Former Smoker    Packs/day: 1.00    Types: Cigarettes    Quit date: 08/01/2012  . Smokeless tobacco: Never Used  . Alcohol use No  . Drug use: No  . Sexual activity: Yes   Other Topics Concern  . None   Social History Narrative  . None   Additional Social History:                         Sleep: Fair  Appetite:  Poor  Current Medications: Current Facility-Administered Medications  Medication Dose Route  Frequency Provider Last Rate Last Dose  . acetaminophen (TYLENOL) tablet 650 mg  650 mg Oral Q6H PRN Gonzella Lex, MD   650 mg at 10/17/16 0817  . alum & mag hydroxide-simeth (MAALOX/MYLANTA) 200-200-20 MG/5ML suspension 30 mL  30 mL Oral Q4H PRN Gonzella Lex, MD      . amLODipine (NORVASC) tablet 10 mg  10 mg Oral Daily Clovis Fredrickson, MD   10 mg at 10/18/16 0817  . atorvastatin (LIPITOR) tablet 80 mg  80 mg Oral q1800 Gonzella Lex, MD   80 mg at 10/16/16 1718  . clopidogrel (PLAVIX) tablet 75 mg  75 mg Oral Daily Gonzella Lex, MD   75 mg at 10/18/16 0817  . divalproex (DEPAKOTE) DR tablet 500 mg  500 mg Oral Q12H Kimbrely Buckel B Zaley Talley, MD   500 mg at 10/18/16 0817  . fluvoxaMINE (LUVOX) tablet 200 mg  200 mg Oral QHS Wai Minotti B Josede Cicero, MD   200 mg at 10/17/16 2135  . furosemide (LASIX) tablet 20 mg  20 mg Oral Daily Haizel Gatchell B Aspyn Warnke, MD   20 mg at 10/18/16 0817  . haloperidol lactate (HALDOL) injection 10 mg  10 mg Intramuscular Daily Yessenia Maillet B Kessie Croston, MD   10 mg at 10/13/16 0857   Or  . haloperidol (HALDOL) tablet 10 mg  10 mg Oral Daily Clovis Fredrickson, MD   10 mg at 10/18/16 0817  . magnesium hydroxide (MILK OF MAGNESIA) suspension 30 mL  30 mL Oral Daily PRN Gonzella Lex, MD      . mometasone-formoterol [facility]) 200-5 MCG/ACT inhaler 2 puff  2 puff Inhalation BID Gonzella Lex, MD   2 puff at 10/17/16 2136  . nitroGLYCERIN (NITROSTAT) SL tablet 0.4 mg  0.4 mg Sublingual  Q5 min PRN Gonzella Lex, MD   0.4 mg at 10/14/16 2118  . OLANZapine (ZYPREXA) injection 15 mg  15 mg Intramuscular BID PRN Gonzella Lex, MD   15 mg at 10/10/16 2150  . OLANZapine zydis (ZYPREXA) disintegrating tablet 15 mg  15 mg Oral BID AC & HS Iker Nuttall B Manisha Cancel, MD   15 mg at 10/18/16 0817  . potassium chloride SA (K-DUR,KLOR-CON) CR tablet 10 mEq  10 mEq Oral Daily Clovis Fredrickson, MD   10 mEq at 10/18/16 0818  . tamsulosin (FLOMAX) capsule 0.4 mg  0.4 mg Oral Daily Kyan Yurkovich B  Kristin Barcus, MD   0.4 mg at 10/18/16 0818  . traZODone (DESYREL) tablet 50 mg  50 mg Oral QHS Gonzella Lex, MD   50 mg at 10/17/16 2135    Lab Results:  No results found for this or any previous visit (from the past 48 hour(s)).  Blood Alcohol level:  Lab Results  Component Value Date   ETH <5 10/02/2016   ETH <5 62/69/4854    Metabolic Disorder Labs: Lab Results  Component Value Date   HGBA1C 6.3 (H) 10/10/2016   MPG 134 10/10/2016   No results found for: PROLACTIN Lab Results  Component Value Date   CHOL 134 10/03/2016   TRIG 88 10/03/2016   HDL 53 10/03/2016   CHOLHDL 2.5 10/03/2016   VLDL 18 10/03/2016   LDLCALC 63 10/03/2016    Physical Findings: AIMS:  , ,  ,  ,    CIWA:    COWS:     Musculoskeletal: Strength & Muscle Tone: within normal limits Gait & Station: normal Patient leans: N/A  Psychiatric Specialty Exam: Physical Exam  Nursing note and vitals reviewed.   Review of Systems  Psychiatric/Behavioral: Positive for hallucinations and suicidal ideas. The patient is nervous/anxious.   All other systems reviewed and are negative.   Blood pressure 134/90, pulse (!) 106, temperature 97.8 F (36.6 C), temperature source Oral, resp. rate 18, height 5' 1"  (1.549 m), weight 71.2 kg (157 lb), SpO2 100 %.Body mass index is 29.66 kg/m.  General Appearance: Disheveled  Eye Contact:  Good  Speech:  Pressured  Volume:  Increased  Mood:  Anxious  Affect:  Congruent, anxious  Thought Process:  Disorganized and Descriptions of Associations: Loose  Orientation:  Full (Time, Place, and Person)  Thought Content:  Illogical, Delusions, Paranoid Ideation and Rumination  Suicidal Thoughts:  denies  Homicidal Thoughts:  No  Memory:  Immediate;   Poor Recent;   Poor Remote;   Poor  Judgement:  Poor  Insight:  Lacking  Psychomotor Activity:  Increased  Concentration:  Concentration: Poor  Recall:  Poor  Fund of Knowledge:  Fair  Language:  Fair  Akathisia:  No   Handed:  Right  AIMS (if indicated):     Assets:  Communication Skills Desire for Improvement Financial Resources/Insurance Housing Resilience Social Support  ADL's:  Intact  Cognition:  WNL  Sleep:  Number of Hours: 8.45     Treatment Plan Summary: Daily contact with patient to assess and evaluate symptoms and progress in treatment and Medication management   Kirsten Castro is a 56 year old female with history of depression admitted for worsening of her symptoms, paranoia and suicidal threats in the context of treatment noncompliance.  *   Agitation. Haldol, Ativan and Benadryl are available per forced medication order.  1. Suicidal ideation. The patient is able to contract for safety in the hospital.  2.  Mood, anxiety and psychosis. We started Zyprexa and Haldol for psychosis, Depakote for mood stabilization and Luvox for depression, anxiety, and trichotillomania. She is on forced medications now and refuses oral  meds. VPA level 20, ammonia low on 10/10/2016.   3. Hypertension. She is on lisinopril, furosemide, and potassium. Blood pressure is normal.   4. Coronary artery disease. She is on Lipitor and nitroglycerin. We will order EKG when the patient is calmer.  5. GERD. She is on Protonix.  6. COPD. She is on [place].  7. Urinary retention. She is on Flomax.  8. Insomnia. Trazodone is available.  9. Metabolic syndrome monitoring. Lipid panel and TSH are normal. Hemoglobin A1c 6.3.    10. B12 deficiency. B12 level is low. We tried B12 injections but the patient refuses.  11. Disposition. She will be discharged to home with her husband. She will follow up with mental health professionals.  Orson Slick, MD 10/18/2016, 11:44 AM   Patient ID: Kirsten Castro, female   DOB: 1961-04-24, 56 y.o.   MRN: [id number] Patient ID: Kirsten Castro, female   DOB: 11-Jul-1961, 56 y.o.   MRN: [id number]  Blood pressure 134/90, pulse (!) 106, temperature 97.8 F (36.6  C), temperature source Oral, resp. rate 18, height 5' 1"  (1.549 m), weight 71.2 kg (157 lb), SpO2 100 %.Body mass index is 29.66 kg/m.  General Appearance: Disheveled  Eye Contact:  Good  Speech:  Pressured  Volume:  Increased  Mood:  Anxious  Affect:  Congruent, anxious  Thought Process:  Disorganized and Descriptions of Associations: Loose  Orientation:  Full (Time, Place, and Person)  Thought Content:  Illogical, Delusions, Paranoid Ideation and Rumination  Suicidal Thoughts:  denies  Homicidal Thoughts:  No  Memory:  Immediate;   Poor Recent;   Poor Remote;   Poor  Judgement:  Poor  Insight:  Lacking  Psychomotor Activity:  Increased  Concentration:  Concentration: Poor  Recall:  Poor  Fund of Knowledge:  Fair  Language:  Fair  Akathisia:  No  Handed:  Right  AIMS (if indicated):     Assets:  Communication Skills Desire for Improvement Financial Resources/Insurance Housing Resilience Social Support  ADL's:  Intact  Cognition:  WNL  Sleep:  Number of Hours: 8.45     Treatment Plan Summary: Daily contact with patient to assess and evaluate symptoms and progress in treatment and Medication management   Kirsten Castro is a 56 year old female with history of depression admitted for worsening of her symptoms, paranoia and suicidal threats in the context of treatment noncompliance.  *   Agitation. Haldol, Ativan and Benadryl are available per forced medication order.  1. Suicidal ideation. The patient is able to contract for safety in the hospital.  2. Mood, anxiety and psychosis. We started Zyprexa and Haldol for psychosis, Depakote for mood stabilization and Luvox for depression, anxiety, and trichotillomania. She is on forced medications now and refuses oral  meds. VPA level 20, ammonia low on 10/10/2016. VPA level in am.  3. Hypertension. She is on lisinopril, furosemide, and potassium. Blood pressure is normal.   4. Coronary artery disease. She is on Lipitor and  nitroglycerin. We will order EKG when the patient is calmer.  5. GERD. She is on Protonix.  6. COPD. She is on [place].  7. Urinary retention. She is on Flomax.  8. Insomnia. Trazodone is available.  9. Metabolic syndrome monitoring. Lipid panel and TSH are normal. Hemoglobin A1c 6.3.    10. B12 deficiency.  B12 level is low. We tried B12 injections but the patient refuses.  11. ECT Dr. Weber Cooks discussed with the patient possibility of ECT to treat her severe depression. Patient listened with some attention but made no comment. Was not ready to agreed to treatment plan. Still has limited insight.   12. Disposition. Family meeting tomorrow. She will be discharged to home with her husband. She will follow up with mental health professionals.   Orson Slick, MD 10/18/2016, 11:44 AM

## 2016-10-19 ENCOUNTER — Inpatient Hospital Stay: Payer: BLUE CROSS/BLUE SHIELD

## 2016-10-19 DIAGNOSIS — R079 Chest pain, unspecified: Secondary | ICD-10-CM

## 2016-10-19 LAB — COMPREHENSIVE METABOLIC PANEL
ALK PHOS: 68 U/L (ref 38–126)
ALT: 24 U/L (ref 14–54)
AST: 28 U/L (ref 15–41)
Albumin: 3.9 g/dL (ref 3.5–5.0)
Anion gap: 10 (ref 5–15)
BILIRUBIN TOTAL: 1.1 mg/dL (ref 0.3–1.2)
BUN: 21 mg/dL — AB (ref 6–20)
CALCIUM: 9.4 mg/dL (ref 8.9–10.3)
CO2: 26 mmol/L (ref 22–32)
CREATININE: 0.8 mg/dL (ref 0.44–1.00)
Chloride: 101 mmol/L (ref 101–111)
Glucose, Bld: 111 mg/dL — ABNORMAL HIGH (ref 65–99)
Potassium: 3.4 mmol/L — ABNORMAL LOW (ref 3.5–5.1)
Sodium: 137 mmol/L (ref 135–145)
Total Protein: 7 g/dL (ref 6.5–8.1)

## 2016-10-19 LAB — VALPROIC ACID LEVEL: VALPROIC ACID LVL: 71 ug/mL (ref 50.0–100.0)

## 2016-10-19 LAB — AMMONIA: Ammonia: 35 umol/L (ref 9–35)

## 2016-10-19 MED ORDER — OLANZAPINE 15 MG PO TBDP: 30.0000 mg | ORAL_TABLET | Freq: Every day | ORAL | 1 refills | Status: DC

## 2016-10-19 MED ORDER — FLUVOXAMINE MALEATE 100 MG PO TABS: 200.0000 mg | ORAL_TABLET | Freq: Every day | ORAL | 1 refills | Status: DC

## 2016-10-19 MED ORDER — TRAZODONE HCL 100 MG PO TABS
100.0000 mg | ORAL_TABLET | Freq: Every day | ORAL | Status: DC
  Administered 2016-10-19: 100 mg via ORAL
  Filled 2016-10-19: qty 1

## 2016-10-19 MED ORDER — HALOPERIDOL 5 MG PO TABS: 10.0000 mg | ORAL_TABLET | Freq: Every day | ORAL | Status: DC

## 2016-10-19 MED ORDER — LORAZEPAM 2 MG PO TABS: 2.0000 mg | ORAL_TABLET | Freq: Once | ORAL | Status: DC

## 2016-10-19 MED ORDER — VITAMIN B-12 1000 MCG PO TABS
1000.0000 ug | ORAL_TABLET | Freq: Every day | ORAL | Status: DC
  Administered 2016-10-20: 1000 ug via ORAL
  Filled 2016-10-19: qty 1

## 2016-10-19 MED ORDER — OLANZAPINE 5 MG PO TBDP: 30.0000 mg | ORAL_TABLET | Freq: Every day | ORAL | Status: DC

## 2016-10-19 MED ORDER — HALOPERIDOL 10 MG PO TABS: 10.0000 mg | ORAL_TABLET | Freq: Every day | ORAL | 1 refills | Status: DC

## 2016-10-19 MED ORDER — DIVALPROEX SODIUM 500 MG PO DR TAB: 500.0000 mg | DELAYED_RELEASE_TABLET | Freq: Two times a day (BID) | ORAL | 1 refills | Status: DC

## 2016-10-19 NOTE — [facility] Suicide Risk Assessment (Addendum)
[facility] Discharge Suicide Risk Assessment   Principal Problem: Severe recurrent major depression with psychotic features [facility]) Discharge Diagnoses:  Patient Active Problem List   Diagnosis Date Noted  . Severe recurrent major depression with psychotic features ([place]) [F33.3] 10/02/2016  . Benzodiazepine withdrawal ([place]) [F13.239] 10/02/2016  . 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 Abdominal pain [R10.9] 04/19/2016  . Sepsis ([place]) [A41.9] 03/09/2016  . Urinary retention [R33.9] 03/09/2016  . CAD (coronary artery disease) [I25.10] 03/09/2016  . Frank hematuria [R31.0] 03/08/2016  . Temporary cerebral vascular dysfunction [G93.9] 02/03/2016  . Peripheral vascular disease ([place]) [I73.9] 02/03/2016  . BP (high blood pressure) [I10] 02/03/2016  . HLD (hyperlipidemia) [E78.5] 02/03/2016  . Clinical depression [F32.9] 02/03/2016  . Carotid artery narrowing [I65.29] 02/03/2016  . 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 Malignant neoplastic disease ([place]) [C80.1] 02/03/2016  . Anxiety [F41.9] 02/03/2016  . Abdominal pain, generalized [R10.84]   . Nausea with vomiting [R11.2]   . Gastritis [K29.70]   . Esophageal candidiasis ([place]) [B37.81]   . Noninfectious diarrhea [K52.9]   . Intractable pain [R52] 01/21/2016  . Upper abdominal pain [R10.10]   . Nausea and vomiting in adult [R11.2]   . Ischemic colitis ([place]) [K55.9] 01/01/2016  . Colitis [K52.9] 12/15/2015  . GI bleed [K92.2] 12/15/2015  . Acute inflammation of the pancreas [K85.90] 12/06/2015  . Vitamin B12 deficiency anemia [D51.9] 02/25/2015  . Absolute anemia [D64.9] 02/25/2015  . Carotid stenosis [I65.29] 02/10/2015  . Personal history of other specified conditions [Z87.898] 11/27/2014  . Osteomyelitis ([place]) [M86.9] 11/27/2014  . H/O coronary artery bypass surgery [Z95.1] 05/18/2014    Total Time spent with patient: 30 minutes  Musculoskeletal: Strength & Muscle Tone: within normal limits Gait  & Station: normal Patient leans: N/A  Psychiatric Specialty Exam: Review of Systems  Psychiatric/Behavioral: Negative for hallucinations.  All other systems reviewed and are negative.   Blood pressure 125/75, pulse 88, temperature 97.8 F (36.6 C), temperature source Oral, resp. rate 18, height 5\' 1"  (1.549 m), weight 71.2 kg (157 lb), SpO2 100 %.Body mass index is 29.66 kg/m.  General Appearance: Fairly Groomed  Engineer, water::  Good  Speech:  Clear and Coherent409  Volume:  Normal  Mood:  Anxious  Affect:  Appropriate  Thought Process:  Goal Directed and Descriptions of Associations: Intact  Orientation:  Full (Time, Place, and Person)  Thought Content:  Delusions and Paranoid Ideation  Suicidal Thoughts:  No  Homicidal Thoughts:  No  Memory:  Immediate;   Fair Recent;   Fair Remote;   Fair  Judgement:  Impaired  Insight:  Shallow  Psychomotor Activity:  Normal  Concentration:  Fair  Recall:  [place]  Language: Fair  Akathisia:  No  Handed:  Right  AIMS (if indicated):     Assets:  Communication Skills Desire for Improvement Financial Resources/Insurance Housing Resilience Social Support  Sleep:  Number of Hours: 8.45  Cognition: WNL  ADL's:  Intact   Mental Status Per Nursing Assessment::   On Admission:  Suicidal ideation indicated by others  Demographic Factors:  Caucasian  Loss Factors: NA  Historical Factors: Impulsivity  Risk Reduction Factors:   Sense of responsibility to family, Living with another person, especially a relative and Positive social support  Continued Clinical Symptoms:  Depression:   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  Patients presenting  with no risk factors but with morbid ruminations; may be classified as minimal risk based on the severity of the depressive  symptoms    Plan Of Care/Follow-up recommendations:  Activity:  As tolerated. Diet:  Low sodium heart healthy. Other:  Keep follow-up appointments.  Orson Slick, MD 10/19/2016, 12:44 PM

## 2016-10-19 NOTE — Progress Notes (Addendum)
Patient with sad affect, cooperative with meals and meds. No SI/HI at this time. Minimal interaction with peers. Verbalizes needs to nurse assertively. Requests her meds crushed with applesauce. Requests her sheets changed. No distress, no complaint. Meets with MD. Safety maintained. Nurse assist patient with hair care. Patient stays in room during free time. Husband talks with MD/LCSW rt plan of care.

## 2016-10-19 NOTE — [facility] Group Notes (Signed)
[place] LCSW Group Therapy Note  Date/Time: 10/19/16 1300  Type of Therapy/Topic:  Group Therapy:  Balance in Life  Participation Level:  Pt was invited but did not attend group.  Description of Group:    This group will address the concept of balance and how it feels and looks when one is unbalanced. Patients will be encouraged to process areas in their lives that are out of balance, and identify reasons for remaining unbalanced. Facilitators will guide patients utilizing problem- solving interventions to address and correct the stressor making their life unbalanced. Understanding and applying boundaries will be explored and addressed for obtaining  and maintaining a balanced life. Patients will be encouraged to explore ways to assertively make their unbalanced needs known to significant others in their lives, using other group members and facilitator for support and feedback.  Therapeutic Goals: 1. Patient will identify two or more emotions or situations they have that consume much of in their lives. 2. Patient will identify signs/triggers that life has become out of balance:  3. Patient will identify two ways to set boundaries in order to achieve balance in their lives:  4. Patient will demonstrate ability to communicate their needs through discussion and/or role plays  Summary of Patient Progress:     Therapeutic Modalities:   Cognitive Behavioral Therapy Solution-Focused Therapy Assertiveness Training  Lurline Idol, LCSW

## 2016-10-19 NOTE — [facility] Group Notes (Signed)
[facility] Group Notes:  (Nursing/MHT/Case Management/Adjunct)  Date:  10/19/2016  Time:  6:26 PM  Type of Therapy:  Psychoeducational Skills  Participation Level:  Did Not Attend    Drake Leach 10/19/2016, 6:26 PM

## 2016-10-19 NOTE — Tx Team (Signed)
Interdisciplinary Treatment and Diagnostic Plan Update  10/19/2016 Time of Session: 10:30am DELCINE BOUTWELL MRN: [id number]  Principal Diagnosis: Severe recurrent major depression with psychotic features [facility])  Secondary Diagnoses: Principal Problem:   Severe recurrent major depression with psychotic features ([place]) Active Problems:   CAD (coronary artery disease)   Benzodiazepine withdrawal ([place])   Trichotillomania   Current Medications:  Current Facility-Administered Medications  Medication Dose Route Frequency Provider Last Rate Last Dose  . acetaminophen (TYLENOL) tablet 650 mg  650 mg Oral Q6H PRN Gonzella Lex, MD   650 mg at 10/17/16 0817  . alum & mag hydroxide-simeth (MAALOX/MYLANTA) 200-200-20 MG/5ML suspension 30 mL  30 mL Oral Q4H PRN Gonzella Lex, MD      . amLODipine (NORVASC) tablet 10 mg  10 mg Oral Daily [name], MD   10 mg at 10/19/16 0746  . atorvastatin (LIPITOR) tablet 80 mg  80 mg Oral q1800 Gonzella Lex, MD   80 mg at 10/18/16 1706  . clopidogrel (PLAVIX) tablet 75 mg  75 mg Oral Daily Gonzella Lex, MD   75 mg at 10/19/16 0745  . divalproex (DEPAKOTE) DR tablet 500 mg  500 mg Oral Q12H [name], MD   500 mg at 10/19/16 0746  . fluvoxaMINE (LUVOX) tablet 200 mg  200 mg Oral QHS [name], MD   200 mg at 10/18/16 2151  . furosemide (LASIX) tablet 20 mg  20 mg Oral Daily [name], MD   20 mg at 10/19/16 0746  . haloperidol lactate (HALDOL) injection 10 mg  10 mg Intramuscular Daily [name], MD   10 mg at 10/13/16 0857   Or  . haloperidol (HALDOL) tablet 10 mg  10 mg Oral Daily Clovis Fredrickson, MD   10 mg at 10/19/16 0746  . magnesium hydroxide (MILK OF MAGNESIA) suspension 30 mL  30 mL Oral Daily PRN Gonzella Lex, MD      . mometasone-formoterol [facility]) 200-5 MCG/ACT inhaler 2 puff  2 puff Inhalation BID Gonzella Lex, MD   2 puff at 10/19/16 0753  . nitroGLYCERIN (NITROSTAT) SL tablet 0.4  mg  0.4 mg Sublingual Q5 min PRN Gonzella Lex, MD   0.4 mg at 10/14/16 2118  . OLANZapine (ZYPREXA) injection 15 mg  15 mg Intramuscular BID PRN Gonzella Lex, MD   15 mg at 10/10/16 2150  . OLANZapine zydis (ZYPREXA) disintegrating tablet 15 mg  15 mg Oral BID AC & HS [name], MD   15 mg at 10/19/16 0745  . potassium chloride SA (K-DUR,KLOR-CON) CR tablet 10 mEq  10 mEq Oral Daily Clovis Fredrickson, MD   10 mEq at 10/19/16 0753  . tamsulosin (FLOMAX) capsule 0.4 mg  0.4 mg Oral Daily [name], MD   0.4 mg at 10/19/16 0746  . traZODone (DESYREL) tablet 50 mg  50 mg Oral QHS Gonzella Lex, MD   50 mg at 10/18/16 2143   PTA Medications: Facility-Administered Medications Prior to Admission  Medication Dose Route Frequency Provider Last Rate Last Dose  . lidocaine (XYLOCAINE) 2 % jelly 1 application  1 application Urethral Once Nickie Retort, MD       Prescriptions Prior to Admission  Medication Sig Dispense Refill Last Dose  . acetaminophen (TYLENOL) 500 MG tablet Take 1,000 mg by mouth every 6 (six) hours as needed for mild pain or headache.   Taking  . ALPRAZolam Duanne Moron)  1 MG tablet Take 1 mg by mouth 3 (three) times daily as needed for anxiety or sleep.    Taking  . atorvastatin (LIPITOR) 80 MG tablet Take 80 mg by mouth daily.   Taking  . cetirizine (ZYRTEC) 10 MG tablet Take 10 mg by mouth daily. Reported on 02/08/2016   Not Taking  . clopidogrel (PLAVIX) 75 MG tablet Take 75 mg by mouth daily.    Taking  . clotrimazole (LOTRIMIN) 1 % cream APP EXT AA BID   Not Taking  . clotrimazole-betamethasone (LOTRISONE) cream Apply topically.   Not Taking  . cyclobenzaprine (FLEXERIL) 10 MG tablet Take 10 mg by mouth 3 (three) times daily as needed for muscle spasms.    Taking  . Fluticasone-Salmeterol (ADVAIR DISKUS) 250-50 MCG/DOSE AEPB Inhale 1 puff into the lungs 2 (two) times daily.    Taking  . furosemide (LASIX) 20 MG tablet Take 20 mg by mouth.   Taking  .  hydrocortisone (ANUSOL-HC) 2.5 % rectal cream Apply topically.   Not Taking  . lisinopril (ZESTRIL) 10 MG tablet Take 1 tablet (10 mg total) by mouth daily. 60 tablet 1 Taking  . Magnesium 250 MG TABS Take 250 mg by mouth 2 (two) times daily.   Taking  . ondansetron (ZOFRAN) 4 MG tablet Take 1 tablet (4 mg total) by mouth daily as needed for nausea or vomiting. 20 tablet 1   . ondansetron (ZOFRAN-ODT) 4 MG disintegrating tablet Take 4 mg by mouth every 8 (eight) hours as needed.    Taking  . pantoprazole (PROTONIX) 40 MG tablet Take 1 tablet (40 mg total) by mouth 2 (two) times daily. 60 tablet 1 Taking  . phenazopyridine (PYRIDIUM) 100 MG tablet    Not Taking  . polyethylene glycol powder (GLYCOLAX/MIRALAX) powder    Not Taking  . potassium chloride (MICRO-K) 10 MEQ CR capsule Take 10 mEq by mouth 2 (two) times daily.    Taking  . senna-docusate (SENOKOT-S) 8.6-50 MG tablet Take 1 tablet by mouth at bedtime as needed for mild constipation.   Taking  . tamsulosin (FLOMAX) 0.4 MG CAPS capsule TAKE ONE CAPSULE BY MOUTH DAILY WITH BREAKFAST   Not Taking  . traZODone (DESYREL) 50 MG tablet Take 50 mg by mouth at bedtime as needed.   Taking    Patient Stressors: Health problems Medication change or noncompliance  Patient Strengths: Ability for insight Capable of independent living Supportive family/friends  Treatment Modalities: Medication Management, Group therapy, Case management,  1 to 1 session with clinician, Psychoeducation, Recreational therapy.   Physician Treatment Plan for Primary Diagnosis: Severe recurrent major depression with psychotic features ([place]) Long Term Goal(s): Improvement in symptoms so as ready for discharge NA   Short Term Goals: Ability to identify changes in lifestyle to reduce recurrence of condition will improve Ability to verbalize feelings will improve Ability to disclose and discuss suicidal ideas Ability to demonstrate self-control will improve Ability to  identify and develop effective coping behaviors will improve Ability to maintain clinical measurements within normal limits will improve Compliance with prescribed medications will improve Ability to identify triggers associated with substance abuse/mental health issues will improve NA  Medication Management: Evaluate patient's response, side effects, and tolerance of medication regimen.  Therapeutic Interventions: 1 to 1 sessions, Unit Group sessions and Medication administration.  Evaluation of Outcomes: Progressing  Physician Treatment Plan for Secondary Diagnosis: Principal Problem:   Severe recurrent major depression with psychotic features ([place]) Active Problems:   CAD (coronary artery disease)  Benzodiazepine withdrawal ([place])   Trichotillomania  Long Term Goal(s): Improvement in symptoms so as ready for discharge NA   Short Term Goals: Ability to identify changes in lifestyle to reduce recurrence of condition will improve Ability to verbalize feelings will improve Ability to disclose and discuss suicidal ideas Ability to demonstrate self-control will improve Ability to identify and develop effective coping behaviors will improve Ability to maintain clinical measurements within normal limits will improve Compliance with prescribed medications will improve Ability to identify triggers associated with substance abuse/mental health issues will improve NA     Medication Management: Evaluate patient's response, side effects, and tolerance of medication regimen.  Therapeutic Interventions: 1 to 1 sessions, Unit Group sessions and Medication administration.  Evaluation of Outcomes: Progressing   RN Treatment Plan for Primary Diagnosis: Severe recurrent major depression with psychotic features ([place]) Long Term Goal(s): Knowledge of disease and therapeutic regimen to maintain health will improve  Short Term Goals: Compliance with prescribed medications will improve  Medication  Management: RN will administer medications as ordered by provider, will assess and evaluate patient's response and provide education to patient for prescribed medication. RN will report any adverse and/or side effects to prescribing provider.  Therapeutic Interventions: 1 on 1 counseling sessions, Psychoeducation, Medication administration, Evaluate responses to treatment, Monitor vital signs and CBGs as ordered, Perform/monitor CIWA, COWS, AIMS and Fall Risk screenings as ordered, Perform wound care treatments as ordered.  Evaluation of Outcomes: Progressing   LCSW Treatment Plan for Primary Diagnosis: Severe recurrent major depression with psychotic features ([place]) Long Term Goal(s): Safe transition to appropriate next level of care at discharge, Engage patient in therapeutic group addressing interpersonal concerns.  Short Term Goals: Engage patient in aftercare planning with referrals and resources and Increase skills for wellness and recovery  Therapeutic Interventions: Assess for all discharge needs, 1 to 1 time with Social worker, Explore available resources and support systems, Assess for adequacy in community support network, Educate family and significant other(s) on suicide prevention, Complete Psychosocial Assessment, Interpersonal group therapy.  Evaluation of Outcomes: Progressing   Progress in Treatment: Attending groups: No. Participating in groups: No. Taking medication as prescribed: Yes. Toleration medication: Yes. Family/Significant other contact made: Yes, individual(s) contacted:  husband Patient understands diagnosis: Yes. Discussing patient identified problems/goals with staff: Yes. Medical problems stabilized or resolved: Yes. Denies suicidal/homicidal ideation: Yes. Issues/concerns per patient self-inventory: No. Other: n/a  New problem(s) identified: None identified at this time.   New Short Term/Long Term Goal(s): None identified at this time.   Discharge  Plan or Barriers: Patient will discharge home with husband and follow-up with outpatient services.  Reason for Continuation of Hospitalization: Anxiety Depression Medication stabilization  Estimated Length of Stay: 3 to 5 days  Attendees: Patient: Kirsten Castro 10/19/2016 11:43 AM  Physician: Dr. Orson Slick, MD 10/19/2016 11:43 AM  Nursing: Polly Cobia, RN 10/19/2016 11:43 AM  RN Care Manager: 10/19/2016 11:43 AM  Social Worker: Jen Mow. Satira Sark 10/19/2016 11:43 AM  Recreational Therapist: Leonette Monarch, LRT/CTRS 10/19/2016 11:43 AM  Other:  10/19/2016 11:43 AM  Other:  10/19/2016 11:43 AM  Other: 10/19/2016 11:43 AM    Scribe for Treatment Team: Jolaine Click, LCSWA 10/19/2016 11:49 AM

## 2016-10-19 NOTE — Progress Notes (Signed)
10/19/16 1400 Family meeting with husband, Docie Abramovich.  CSW met individually with Mr. Mcneish first and discussed his plans for pt.  At this time, he is not planning to hire anyone to watch pt while he is at work.  He works 3rd shift, 7a-7p, so she will be asleep a large part of this time.  His work is also only 5 minutes from his home and he can leave and come home if the need arises.  He also has a daughter in [place] who has volunteered to come and stay with pt for several weeks if that is necessary.  He is also going to speak to his insurance company to see if any sort of home health aid would be covered.  CSW then invited pt into the meeting.  Pt was glad to see husband and told CSW and husband that she was ready to go home.  CSW asked pt how she felt about being home while husband worked and she said that she would be fine.  She acknowledged that she knows she needs to take her medication.  Husband asked that pt be discharged tomorrow as he wants to get a few things ready at the home and contact the insurance company.  CSW said that was fine.  Husband and pt continued to visit for a little while after CSW was done. Lurline Idol, LCSW

## 2016-10-19 NOTE — Plan of Care (Signed)
Problem: Medication: Goal: Compliance with prescribed medication regimen will improve Outcome: Progressing Took medications per encouragements

## 2016-10-19 NOTE — Progress Notes (Signed)
[facility] MD Progress Note  10/19/2016 5:08 PM Kirsten Castro  MRN:  [id number]  Subjective:   Kirsten Castro has a history of depression, possibly bipolar disorder. She is floridly psychotic and refused medications twice in the past 24 hours. FORCED MEDICATION order since 10/04/2016.  10/08/2016 Pt seen, chart reviewed, discussed with nursing staff. Pt is reluctant to take med, but taking, had breakfast in am. States mood is " so so ", no ne w complaints. on FMP. Pt still psychotic, delusional, paranoid as before. Pt states she is anxious, and nothing will help, states she hears voices as before. Denies SI/HI.   10/09/2016. Ms Castro remains uncooperative and rude. She is on forced medication order. She composes taking any medications by mouth or by injection with an argument that she has not been eating any food here, therefore she should not be given any medications. She still wants me to check her body for "fluids". SW spoke with the husband who believes that all her problems started following bypass surgery with complications leading to confusion and behavioral changes. We will continue forced medication orders as the patient is still psychotic and unable to make educated decision about her treatment.  10/10/2016. Kirsten Castro met with the treatment team for the first time today. She is very anxious appearing, making strange noises, unable to sit still, fidgety, and slightly agitated. She however was able to have a brief conversation with her treatment team. She was telling us again that she is unable to take any medications and should not be given any because she is "full of water and doesn't eat anything here". She did take her medications today with encouragement. She does better when we offered her Coca-Cola to drink. She also signed her name on the form indicating that she participated in the meeting with no hesitation. She agreed to draw labs trhis morning. Unfortunately VPA level is only 20 as she  refused most of her doses. The patient is unconvinced that medications are helpful. Moreover she believes that it's "too late" now for treatment. This statement suggests a diagnosis of depression with psychotic features.  10/11/2016. Kirsten Castro appears slightly better today. She ate half of her breakfast and lunch. She properly introduced herself to a staff member this morning. On direkt questioning, she tells me that she is feeling "no good". She refused medications even with encouragement and a bribe with Coca cola. She complains of being full of water, unable to drink, or eat, unable to urinate. Indeed, she has a history of urinary retention. Per nursing, she wears a diaper but in spite of her complaints of "water coming from the front and the back", her diaper is frequently dry. She refuses Flomax, even though she knows why this was prescribed. She refuses blood pressure medications but her blood pressure has not been elevated. She adamantly refuses to see a Conservator, museum/gallery. I will offer it to her tomorrow again.  10/12/2016. There is no change. She did take some medications this morning but refused most of them. We continue Haldol, Zyprexa and Depakote for psychosis and mood stabilization. She refuses to see medical doctor.  10/13/2016. For the first time today Kirsten Castro told me that she is doing "so-so". She then immediately preceded to her regular amount about being full of water and not able to take medications. She admitted that she ate half of the breakfast today. She refuses oral medications most of the time but accepts Haldol and Zyprexa by injection. She has been  missing her Plavix, antihypertensives, and Flomax. Blood pressure is stable. Heart rate is slightly elevated.  Follow-up for the 13th. Patient seen. She seems a little confused. Didn't know where she was briefly. Still seems psychotic and agitated and refusing medication. Stays mostly isolated from others on the unit. No specific  new physical complaints  Follow-up the 14th. Patient is still talking about how she has to much water inside her body. She is eating however. Grooming herself better. Denies suicidal thoughts. Still has poor insight and appears very slow in her thinking.  10/16/2016 Kirsten Castro is feeling "bad" again. She is better groomed and dressed but did not respond well to my complements. Still complains of peer appetite but did not mentioned "water" with me today. Dr. Weber Cooks spoke about ECT with her over the weekend but the patient would not commit. She started attending groups. She is not as irritable as before.   10/17/2016. Kirsten Castro seems better today. She ate full dinner last night and accepted medications without any problems. She seems more pleasant. She took good shower. There are no somatic complaints. There is no more talk about "water". Sleep is good. No side effects from massive doses of antipsychotics.She came to group briefly this morning but left early.   10/18/2016. Kirsten Castro has been taking her medications by mouth with some encouragement for several days now. She sleeps well. Her appetite has improved and she consumes for portions now. She sleeps at night. She is more out of her room but does not participate in groups. There are no somatic complaints. She is looking forward to discharge. Family meeting with her husband is scheduled tomorrow at 1:30.  10/19/2016. Kirsten Castro met with treatment team today. She is still somewhat paranoid, reluctant to sign forms and insisting that "there is another page". She denies any symptoms of depression, anxiety or psychosis. She is not suicidal or homicidal. She accepts medications. She is asking to go home. Family meeting with her husband and caregiver completed today. Discharge tomorrow.  Per nursing; 2030: Patient in room, in bed. Alert and oriented and responding when needed. Confused at times. Denies SI/HI. Isolative in room. Was encouraged to join  peers in the dayroom. Patient stated she prefers to be in room. Staff continue to monitor and to provide support. Will continue to assess.  2200: Patient in room awake. Guarded and irritable, not willing to get out of bed. Was encouraged to come to to the medication room. Took medications per encouragements. Thought process: guarded. Avoiding. Endorsing poor judgement. Staff continue to provide support and encouragements. Safety precautions maintained.   Principal Problem: Severe recurrent major depression with psychotic features [facility]) Diagnosis:   Patient Active Problem List   Diagnosis Date Noted  . Severe recurrent major depression with psychotic features ([place]) [F33.3] 10/02/2016  . Benzodiazepine withdrawal ([place]) [F13.239] 10/02/2016  . 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 Abdominal pain [R10.9] 04/19/2016  . Sepsis ([place]) [A41.9] 03/09/2016  . Urinary retention [R33.9] 03/09/2016  . CAD (coronary artery disease) [I25.10] 03/09/2016  . Frank hematuria [R31.0] 03/08/2016  . Temporary cerebral vascular dysfunction [G93.9] 02/03/2016  . Peripheral vascular disease ([place]) [I73.9] 02/03/2016  . BP (high blood pressure) [I10] 02/03/2016  . HLD (hyperlipidemia) [E78.5] 02/03/2016  . Clinical depression [F32.9] 02/03/2016  . Carotid artery narrowing [I65.29] 02/03/2016  . 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 Malignant neoplastic disease ([place]) [C80.1] 02/03/2016  . Anxiety [F41.9] 02/03/2016  . Abdominal pain, generalized [R10.84]   .  Nausea with vomiting [R11.2]   . Gastritis [K29.70]   . Esophageal candidiasis ([place]) [B37.81]   . Noninfectious diarrhea [K52.9]   . Intractable pain [R52] 01/21/2016  . Upper abdominal pain [R10.10]   . Nausea and vomiting in adult [R11.2]   . Ischemic colitis ([place]) [K55.9] 01/01/2016  . Colitis [K52.9] 12/15/2015  . GI bleed [K92.2] 12/15/2015  . Acute inflammation of the pancreas [K85.90] 12/06/2015  . Vitamin B12  deficiency anemia [D51.9] 02/25/2015  . Absolute anemia [D64.9] 02/25/2015  . Carotid stenosis [I65.29] 02/10/2015  . Personal history of other specified conditions [Z87.898] 11/27/2014  . Osteomyelitis ([place]) [M86.9] 11/27/2014  . H/O coronary artery bypass surgery [Z95.1] 05/18/2014   Total Time spent with patient: 20 minutes  Past Psychiatric History: depression.  Past Medical History:  Past Medical History:  Diagnosis Date  . Anemia   . Anginal pain ([place])   . Anxiety   . 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  This was done at [facility]  . BP (high blood pressure) 02/03/2016  . Cancer ([place])    melanoma skin cancer  . Carotid stenosis 02/10/2015  . Chronic kidney disease    UTI  . Colitis 12/15/2015  . Collagen vascular disease ([place])   . COPD (chronic obstructive pulmonary disease) ([place])   . Coronary artery disease   . Depression   . Esophageal candidiasis ([place])   . Gastritis   . GERD (gastroesophageal reflux disease)   . GI bleed 12/15/2015  . Headache   . Hypertension   . Ischemic colitis ([place]) 01/01/2016   Overview:   SMA mesenteric ischemia now status post angioplasty by vascular of In stent restenosis.   Overview:  S?P SMA angioplasty   . Myocardial infarction   . Nausea and vomiting in adult   . Noninfectious diarrhea   . Peripheral vascular disease ([place]) 02/03/2016   Overview:  Follows with Dr. Delana Meyer    . Shortness of breath dyspnea    with exertion  . Stroke [facility])    TIA X 2  . Temporary cerebral vascular dysfunction 02/03/2016    Past Surgical History:  Procedure Laterality  Date  . ABDOMINAL HYSTERECTOMY    . BACK SURGERY    . BREAST BIOPSY Left 6 2017   results not in yet  . COLONOSCOPY WITH PROPOFOL N/A 01/24/2016   Procedure: COLONOSCOPY WITH PROPOFOL;  Surgeon: Lucilla Lame, MD;  Location: [facility] ENDOSCOPY;  Service: Endoscopy;  Laterality: N/A;  . CORONARY ANGIOPLASTY    . CORONARY ARTERY BYPASS GRAFT    . ESOPHAGOGASTRODUODENOSCOPY (EGD) WITH PROPOFOL N/A 01/24/2016   Procedure: ESOPHAGOGASTRODUODENOSCOPY (EGD) WITH PROPOFOL;  Surgeon: Lucilla Lame, MD;  Location: [facility] ENDOSCOPY;  Service: Endoscopy;  Laterality: N/A;  . KYPHOPLASTY N/A 02/08/2016   Procedure: KYPHOPLASTY L1;  Surgeon: Hessie Knows, MD;  Location: [facility] ORS;  Service: Orthopedics;  Laterality: N/A;  . KYPHOPLASTY N/A 03/28/2016   Procedure: KYPHOPLASTY;  Surgeon: Hessie Knows, MD;  Location: [facility] ORS;  Service: Orthopedics;  Laterality: N/A;  . NOSE SURGERY     cancer removal  . OTHER  SURGICAL HISTORY  Jul 11 2014   sternum removal  . PERIPHERAL VASCULAR CATHETERIZATION N/A 02/10/2015   Procedure: Carotid PTA/Stent Intervention;  Surgeon: Katha Cabal, MD;  Location: [place] CV LAB;  Service: Cardiovascular;  Laterality: N/A;  . PERIPHERAL VASCULAR CATHETERIZATION N/A 12/17/2015   Procedure: Visceral Angiography;  Surgeon: Katha Cabal, MD;  Location: [place] CV LAB;  Service: Cardiovascular;  Laterality: N/A;  . PERIPHERAL VASCULAR CATHETERIZATION N/A 12/17/2015   Procedure: Visceral Artery Intervention;  Surgeon: Katha Cabal, MD;  Location: [place] CV LAB;  Service: Cardiovascular;  Laterality: N/A;  . STENT PLACEMENT VASCULAR ([place] HX)     Family History:  Family History  Problem Relation Age of Onset  . Breast cancer Sister   . CAD Father   . CAD Brother    Family Psychiatric  History: see H&P. Social History:  History  Alcohol Use No     History  Drug Use No    Social History   Social History  . Marital status: Married    Spouse name: N/A  .  Number of children: N/A  . Years of education: N/A   Social History Main Topics  . Smoking status: Former Smoker    Packs/day: 1.00    Types: Cigarettes    Quit date: 08/01/2012  . Smokeless tobacco: Never Used  . Alcohol use No  . Drug use: No  . Sexual activity: Yes   Other Topics Concern  . None   Social History Narrative  . None   Additional Social History:                         Sleep: Fair  Appetite:  Poor  Current Medications: Current Facility-Administered Medications  Medication Dose Route Frequency Provider Last Rate Last Dose  . acetaminophen (TYLENOL) tablet 650 mg  650 mg Oral Q6H PRN Gonzella Lex, MD   650 mg at 10/17/16 0817  . alum & mag hydroxide-simeth (MAALOX/MYLANTA) 200-200-20 MG/5ML suspension 30 mL  30 mL Oral Q4H PRN Gonzella Lex, MD      . amLODipine (NORVASC) tablet 10 mg  10 mg Oral Daily Alisha Bacus B Reniah Cottingham, MD   10 mg at 10/19/16 0746  . atorvastatin (LIPITOR) tablet 80 mg  80 mg Oral q1800 Gonzella Lex, MD   80 mg at 10/19/16 1653  . clopidogrel (PLAVIX) tablet 75 mg  75 mg Oral Daily Gonzella Lex, MD   75 mg at 10/19/16 0745  . divalproex (DEPAKOTE) DR tablet 500 mg  500 mg Oral Q12H Chung Chagoya B Janaisha Tolsma, MD   500 mg at 10/19/16 0746  . fluvoxaMINE (LUVOX) tablet 200 mg  200 mg Oral QHS Roarke Marciano B Arla Boutwell, MD   200 mg at 10/18/16 2151  . furosemide (LASIX) tablet 20 mg  20 mg Oral Daily Numan Zylstra B Zyrell Carmean, MD   20 mg at 10/19/16 0746  . [START ON 10/20/2016] haloperidol (HALDOL) tablet 10 mg  10 mg Oral QHS Woodie Trusty B Cinsere Mizrahi, MD      . magnesium hydroxide (MILK OF MAGNESIA) suspension 30 mL  30 mL Oral Daily PRN Gonzella Lex, MD      . mometasone-formoterol (DULERA) 200-5 MCG/ACT inhaler 2 puff  2 puff Inhalation BID Gonzella Lex, MD   2 puff at 10/19/16 0753  . nitroGLYCERIN (NITROSTAT) SL tablet 0.4 mg  0.4 mg Sublingual Q5 min PRN Gonzella Lex, MD   0.4 mg at 10/14/16  2118  . [START ON 10/20/2016] OLANZapine  zydis (ZYPREXA) disintegrating tablet 30 mg  30 mg Oral QHS Jesseca Marsch B Ademide Schaberg, MD      . potassium chloride SA (K-DUR,KLOR-CON) CR tablet 10 mEq  10 mEq Oral Daily Garnet Chatmon B Latalia Etzler, MD   10 mEq at 10/19/16 0753  . tamsulosin (FLOMAX) capsule 0.4 mg  0.4 mg Oral Daily Darrow Barreiro B Tymara Saur, MD   0.4 mg at 10/19/16 0746  . traZODone (DESYREL) tablet 50 mg  50 mg Oral QHS Gonzella Lex, MD   50 mg at 10/18/16 2143    Lab Results:  Results for orders placed or performed during the hospital encounter of 10/02/16 (from the past 48 hour(s))  Comprehensive metabolic panel     Status: Abnormal   Collection Time: 10/19/16  6:42 AM  Result Value Ref Range   Sodium 137 135 - 145 mmol/L   Potassium 3.4 (L) 3.5 - 5.1 mmol/L   Chloride 101 101 - 111 mmol/L   CO2 26 22 - 32 mmol/L   Glucose, Bld 111 (H) 65 - 99 mg/dL   BUN 21 (H) 6 - 20 mg/dL   Creatinine, Ser 0.80 0.44 - 1.00 mg/dL   Calcium 9.4 8.9 - 10.3 mg/dL   Total Protein 7.0 6.5 - 8.1 g/dL   Albumin 3.9 3.5 - 5.0 g/dL   AST 28 15 - 41 U/L   ALT 24 14 - 54 U/L   Alkaline Phosphatase 68 38 - 126 U/L   Total Bilirubin 1.1 0.3 - 1.2 mg/dL   GFR calc non Af Amer >60 >60 mL/min   GFR calc Af Amer >60 >60 mL/min    Comment: (NOTE) The eGFR has been calculated using the CKD EPI equation. This calculation has not been validated in all clinical situations. eGFR's persistently <60 mL/min signify possible Chronic Kidney Disease.    Anion gap 10 5 - 15  Valproic acid level     Status: None   Collection Time: 10/19/16  6:42 AM  Result Value Ref Range   Valproic Acid Lvl 71 50.0 - 100.0 ug/mL  Ammonia     Status: None   Collection Time: 10/19/16  6:42 AM  Result Value Ref Range   Ammonia 35 9 - 35 umol/L    Blood Alcohol level:  Lab Results  Component Value Date   ETH <5 10/02/2016   ETH <5 27/78/2423    Metabolic Disorder Labs: Lab Results  Component Value Date   HGBA1C 6.3 (H) 10/10/2016   MPG 134 10/10/2016   No results  found for: PROLACTIN Lab Results  Component Value Date   CHOL 134 10/03/2016   TRIG 88 10/03/2016   HDL 53 10/03/2016   CHOLHDL 2.5 10/03/2016   VLDL 18 10/03/2016   LDLCALC 63 10/03/2016    Physical Findings: AIMS:  , ,  ,  ,    CIWA:    COWS:     Musculoskeletal: Strength & Muscle Tone: within normal limits Gait & Station: normal Patient leans: N/A  Psychiatric Specialty Exam: Physical Exam  Nursing note and vitals reviewed.   Review of Systems  Psychiatric/Behavioral: Positive for hallucinations and suicidal ideas. The patient is nervous/anxious.   All other systems reviewed and are negative.  [facility] MD Progress Note  10/19/2016 5:08 PM CEDRICA BRUNE  MRN:  [id number]  Subjective:   Ms. Benfer has a history of depression, possibly bipolar disorder. She is floridly psychotic and refused medications twice in the past 24 hours.  FORCED MEDICATION order since 10/04/2016.  10/08/2016 Pt seen, chart reviewed, discussed with nursing staff. Pt is reluctant to take med, but taking, had breakfast in am. States mood is " so so ", no ne w complaints. on FMP. Pt still psychotic, delusional, paranoid as before. Pt states she is anxious, and nothing will help, states she hears voices as before. Denies SI/HI.   10/09/2016. Ms Agrawal remains uncooperative and rude. She is on forced medication order. She composes taking any medications by mouth or by injection with an argument that she has not been eating any food here, therefore she should not be given any medications. She still wants me to check her body for "fluids". SW spoke with the husband who believes that all her problems started following bypass surgery with complications leading to confusion and behavioral changes. We will continue forced medication orders as the patient is still psychotic and unable to make educated decision about her treatment.  10/10/2016. Kirsten Castro met with the treatment team for the first time today. She is  very anxious appearing, making strange noises, unable to sit still, fidgety, and slightly agitated. She however was able to have a brief conversation with her treatment team. She was telling us again that she is unable to take any medications and should not be given any because she is "full of water and doesn't eat anything here". She did take her medications today with encouragement. She does better when we offered her Coca-Cola to drink. She also signed her name on the form indicating that she participated in the meeting with no hesitation. She agreed to draw labs trhis morning. Unfortunately VPA level is only 20 as she refused most of her doses. The patient is unconvinced that medications are helpful. Moreover she believes that it's "too late" now for treatment. This statement suggests a diagnosis of depression with psychotic features.  10/11/2016. Ms. Deliz appears slightly better today. She ate half of her breakfast and lunch. She properly introduced herself to a staff member this morning. On direkt questioning, she tells me that she is feeling "no good". She refused medications even with encouragement and a bribe with Coca cola. She complains of being full of water, unable to drink, or eat, unable to urinate. Indeed, she has a history of urinary retention. Per nursing, she wears a diaper but in spite of her complaints of "water coming from the front and the back", her diaper is frequently dry. She refuses Flomax, even though she knows why this was prescribed. She refuses blood pressure medications but her blood pressure has not been elevated. She adamantly refuses to see a Conservator, museum/gallery. I will offer it to her tomorrow again.  10/12/2016. There is no change. She did take some medications this morning but refused most of them. We continue Haldol, Zyprexa and Depakote for psychosis and mood stabilization. She refuses to see medical doctor.  10/13/2016. For the first time today Ms. Amparo told me  that she is doing "so-so". She then immediately preceded to her regular amount about being full of water and not able to take medications. She admitted that she ate half of the breakfast today. She refuses oral medications most of the time but accepts Haldol and Zyprexa by injection. She has been missing her Plavix, antihypertensives, and Flomax. Blood pressure is stable. Heart rate is slightly elevated.  Per nursing; D: Pt denies SI/HI/AVH but noted responding to internal stimuli. Patient's affect is flat, thoughts are disorganized.  Pt appears anxious, irritable and  verbally aggressive and at times using profanities towards staff. Patient was advised to stop causing at staff. Patient is not interacting with peers and staff appropriately.  A: Pt was offered support and encouragement. Pt was given scheduled medications. Pt was encouraged to attend groups. Q 15 minute checks were done for safety.  R:Pt did not attend group. Pt is complaint with medication. Pt is not receptive to treatment. 15 minutes checks maintained on unit, will continue to monitor. .  Principal Problem: Severe recurrent major depression with psychotic features ([place]) Diagnosis:   Patient Active Problem List   Diagnosis Date Noted  . Severe recurrent major depression with psychotic features ([place]) [F33.3] 10/02/2016  . Benzodiazepine withdrawal ([place]) [F13.239] 10/02/2016  . 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 Abdominal pain [R10.9] 04/19/2016  . Sepsis ([place]) [A41.9] 03/09/2016  . Urinary retention [R33.9] 03/09/2016  . CAD (coronary artery disease) [I25.10] 03/09/2016  . Frank hematuria [R31.0] 03/08/2016  . Temporary cerebral vascular dysfunction [G93.9] 02/03/2016  . Peripheral vascular disease ([place]) [I73.9] 02/03/2016  . BP (high blood pressure) [I10] 02/03/2016  . HLD (hyperlipidemia) [E78.5] 02/03/2016  . Clinical depression [F32.9] 02/03/2016  . Carotid artery narrowing [I65.29] 02/03/2016  . Arteriosclerosis of  bypass graft of coronary artery [I25.810] 02/03/2016  . Malignant neoplastic disease ([place]) [C80.1] 02/03/2016  . Anxiety [F41.9] 02/03/2016  . Abdominal pain, generalized [R10.84]   . Nausea with vomiting [R11.2]   . Gastritis [K29.70]   . Esophageal candidiasis ([place]) [B37.81]   . Noninfectious diarrhea [K52.9]   . Intractable pain [R52] 01/21/2016  . Upper abdominal pain [R10.10]   . Nausea and vomiting in adult [R11.2]   . Ischemic colitis ([place]) [K55.9] 01/01/2016  . Colitis [K52.9] 12/15/2015  . GI bleed [K92.2] 12/15/2015  . Acute inflammation of the pancreas [K85.90] 12/06/2015  . Vitamin B12 deficiency anemia [D51.9] 02/25/2015  . Absolute anemia [D64.9] 02/25/2015  . Carotid stenosis [I65.29] 02/10/2015  . Personal history of other specified conditions [Z87.898] 11/27/2014  . Osteomyelitis ([place]) [M86.9] 11/27/2014  . H/O coronary artery bypass surgery [Z95.1] 05/18/2014   Total Time spent with patient: 20 minutes  Past Psychiatric History: depression.  Past Medical History:  Past Medical History:  Diagnosis Date  . Anemia   . Anginal pain ([place])   . Anxiety   . 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  This was done at [facility]  . BP (high blood pressure) 02/03/2016  . Cancer ([place])    melanoma skin cancer  . Carotid stenosis 02/10/2015  . Chronic kidney disease    UTI  . Colitis 12/15/2015  . Collagen vascular disease ([place])   . COPD (chronic obstructive pulmonary disease) ([place])   . Coronary artery disease   . Depression   . Esophageal candidiasis ([place])   . Gastritis   . GERD  (gastroesophageal reflux disease)   . GI bleed 12/15/2015  . Headache   . Hypertension   . Ischemic colitis ([place]) 01/01/2016   Overview:   SMA mesenteric ischemia now status post angioplasty by vascular of In stent restenosis.   Overview:  S?P SMA angioplasty   . Myocardial infarction   . Nausea and vomiting in adult   . Noninfectious diarrhea   . Peripheral vascular disease ([place]) 02/03/2016   Overview:  Follows with Dr. Delana Meyer    . Shortness of breath dyspnea    with exertion  . Stroke [facility])    TIA X 2  . Temporary cerebral vascular dysfunction 02/03/2016    Past Surgical History:  Procedure Laterality Date  . ABDOMINAL HYSTERECTOMY    . BACK SURGERY    . BREAST BIOPSY Left 6 2017   results not in yet  . COLONOSCOPY WITH PROPOFOL N/A 01/24/2016   Procedure: COLONOSCOPY WITH PROPOFOL;  Surgeon: Lucilla Lame, MD;  Location: [facility] ENDOSCOPY;  Service: Endoscopy;  Laterality: N/A;  . CORONARY ANGIOPLASTY    . CORONARY ARTERY BYPASS GRAFT    . ESOPHAGOGASTRODUODENOSCOPY (EGD) WITH PROPOFOL N/A 01/24/2016   Procedure: ESOPHAGOGASTRODUODENOSCOPY (EGD) WITH PROPOFOL;  Surgeon: Lucilla Lame, MD;  Location: [facility] ENDOSCOPY;  Service: Endoscopy;  Laterality: N/A;  . KYPHOPLASTY N/A 02/08/2016   Procedure: KYPHOPLASTY L1;  Surgeon: Hessie Knows, MD;  Location: [facility] ORS;  Service: Orthopedics;  Laterality: N/A;  . KYPHOPLASTY N/A 03/28/2016   Procedure: KYPHOPLASTY;  Surgeon: Hessie Knows, MD;  Location: [facility] ORS;  Service: Orthopedics;  Laterality: N/A;  . NOSE SURGERY     cancer removal  . OTHER SURGICAL HISTORY  Jul 11 2014   sternum removal  . PERIPHERAL VASCULAR CATHETERIZATION N/A 02/10/2015   Procedure: Carotid PTA/Stent Intervention;  Surgeon: Katha Cabal, MD;  Location: [place] [facility] LAB;  Service: Cardiovascular;  Laterality: N/A;  . PERIPHERAL VASCULAR CATHETERIZATION N/A 12/17/2015   Procedure: Visceral Angiography;  Surgeon: Katha Cabal, MD;  Location: [place] CV LAB;   Service: Cardiovascular;  Laterality: N/A;  . PERIPHERAL VASCULAR CATHETERIZATION N/A 12/17/2015   Procedure: Visceral Artery Intervention;  Surgeon: Katha Cabal, MD;  Location: [place] CV LAB;  Service: Cardiovascular;  Laterality: N/A;  . STENT PLACEMENT VASCULAR ([place] HX)     Family History:  Family History  Problem Relation Age of Onset  . Breast cancer Sister   . CAD Father   . CAD Brother    Family Psychiatric  History: see H&P. Social History:  History  Alcohol Use No     History  Drug Use No    Social History   Social History  . Marital status: Married    Spouse name: N/A  . Number of children: N/A  . Years of education: N/A   Social History Main Topics  . Smoking status: Former Smoker    Packs/day: 1.00    Types: Cigarettes    Quit date: 08/01/2012  . Smokeless tobacco: Never Used  . Alcohol use No  . Drug use: No  . Sexual activity: Yes   Other Topics Concern  . None   Social History Narrative  . None   Additional Social History:                         Sleep: Fair  Appetite:  Poor  Current Medications: Current Facility-Administered Medications  Medication Dose Route Frequency Provider Last Rate Last Dose  . acetaminophen (TYLENOL) tablet 650 mg  650 mg Oral Q6H PRN Gonzella Lex, MD   650 mg at 10/17/16 0817  . alum & mag hydroxide-simeth (MAALOX/MYLANTA) 200-200-20 MG/5ML suspension 30 mL  30 mL Oral Q4H PRN Gonzella Lex, MD      .  amLODipine (NORVASC) tablet 10 mg  10 mg Oral Daily Clovis Fredrickson, MD   10 mg at 10/19/16 0746  . atorvastatin (LIPITOR) tablet 80 mg  80 mg Oral q1800 Gonzella Lex, MD   80 mg at 10/19/16 1653  . clopidogrel (PLAVIX) tablet 75 mg  75 mg Oral Daily Gonzella Lex, MD   75 mg at 10/19/16 0745  . divalproex (DEPAKOTE) DR tablet 500 mg  500 mg Oral Q12H Kiril Hippe B Celese Banner, MD   500 mg at 10/19/16 0746  . fluvoxaMINE (LUVOX) tablet 200 mg  200 mg Oral QHS Candon Caras B Camron Essman, MD   200  mg at 10/18/16 2151  . furosemide (LASIX) tablet 20 mg  20 mg Oral Daily Kele Withem B Taelor Moncada, MD   20 mg at 10/19/16 0746  . [START ON 10/20/2016] haloperidol (HALDOL) tablet 10 mg  10 mg Oral QHS Nickson Middlesworth B Ludean Duhart, MD      . magnesium hydroxide (MILK OF MAGNESIA) suspension 30 mL  30 mL Oral Daily PRN Gonzella Lex, MD      . mometasone-formoterol (DULERA) 200-5 MCG/ACT inhaler 2 puff  2 puff Inhalation BID Gonzella Lex, MD   2 puff at 10/19/16 0753  . nitroGLYCERIN (NITROSTAT) SL tablet 0.4 mg  0.4 mg Sublingual Q5 min PRN Gonzella Lex, MD   0.4 mg at 10/14/16 2118  . [START ON 10/20/2016] OLANZapine zydis (ZYPREXA) disintegrating tablet 30 mg  30 mg Oral QHS Ellawyn Wogan B Jordynn Perrier, MD      . potassium chloride SA (K-DUR,KLOR-CON) CR tablet 10 mEq  10 mEq Oral Daily Tulip Meharg B Haiven Nardone, MD   10 mEq at 10/19/16 0753  . tamsulosin (FLOMAX) capsule 0.4 mg  0.4 mg Oral Daily Atina Feeley B Carmilla Granville, MD   0.4 mg at 10/19/16 0746  . traZODone (DESYREL) tablet 50 mg  50 mg Oral QHS Gonzella Lex, MD   50 mg at 10/18/16 2143    Lab Results:  Results for orders placed or performed during the hospital encounter of 10/02/16 (from the past 48 hour(s))  Comprehensive metabolic panel     Status: Abnormal   Collection Time: 10/19/16  6:42 AM  Result Value Ref Range   Sodium 137 135 - 145 mmol/L   Potassium 3.4 (L) 3.5 - 5.1 mmol/L   Chloride 101 101 - 111 mmol/L   CO2 26 22 - 32 mmol/L   Glucose, Bld 111 (H) 65 - 99 mg/dL   BUN 21 (H) 6 - 20 mg/dL   Creatinine, Ser 0.80 0.44 - 1.00 mg/dL   Calcium 9.4 8.9 - 10.3 mg/dL   Total Protein 7.0 6.5 - 8.1 g/dL   Albumin 3.9 3.5 - 5.0 g/dL   AST 28 15 - 41 U/L   ALT 24 14 - 54 U/L   Alkaline Phosphatase 68 38 - 126 U/L   Total Bilirubin 1.1 0.3 - 1.2 mg/dL   GFR calc non Af Amer >60 >60 mL/min   GFR calc Af Amer >60 >60 mL/min    Comment: (NOTE) The eGFR has been calculated using the CKD EPI equation. This calculation has not been validated in all  clinical situations. eGFR's persistently <60 mL/min signify possible Chronic Kidney Disease.    Anion gap 10 5 - 15  Valproic acid level     Status: None   Collection Time: 10/19/16  6:42 AM  Result Value Ref Range   Valproic Acid Lvl 71 50.0 - 100.0 ug/mL  Ammonia  Status: None   Collection Time: 10/19/16  6:42 AM  Result Value Ref Range   Ammonia 35 9 - 35 umol/L    Blood Alcohol level:  Lab Results  Component Value Date   ETH <5 10/02/2016   ETH <5 51/88/4166    Metabolic Disorder Labs: Lab Results  Component Value Date   HGBA1C 6.3 (H) 10/10/2016   MPG 134 10/10/2016   No results found for: PROLACTIN Lab Results  Component Value Date   CHOL 134 10/03/2016   TRIG 88 10/03/2016   HDL 53 10/03/2016   CHOLHDL 2.5 10/03/2016   VLDL 18 10/03/2016   LDLCALC 63 10/03/2016    Physical Findings: AIMS:  , ,  ,  ,    CIWA:    COWS:     Musculoskeletal: Strength & Muscle Tone: within normal limits Gait & Station: normal Patient leans: N/A  Psychiatric Specialty Exam: Physical Exam  Nursing note and vitals reviewed.   Review of Systems  Psychiatric/Behavioral: Positive for hallucinations and suicidal ideas. The patient is nervous/anxious.   All other systems reviewed and are negative.   Blood pressure 125/75, pulse 88, temperature 97.8 F (36.6 C), temperature source Oral, resp. rate 18, height _0  (1.549 m), weight 71.2 kg (157 lb), SpO2 100 %.Body mass index is 29.66 kg/m.  General Appearance: Disheveled  Eye Contact:  Good  Speech:  Pressured  Volume:  Increased  Mood:  Anxious  Affect:  Congruent, anxious  Thought Process:  Disorganized and Descriptions of Associations: Loose  Orientation:  Full (Time, Place, and Person)  Thought Content:  Illogical, Delusions, Paranoid Ideation and Rumination  Suicidal Thoughts:  denies  Homicidal Thoughts:  No  Memory:  Immediate;   Poor Recent;   Poor Remote;   Poor  Judgement:  Poor  Insight:  Lacking   Psychomotor Activity:  Increased  Concentration:  Concentration: Poor  Recall:  Poor  Fund of Knowledge:  Fair  Language:  Fair  Akathisia:  No  Handed:  Right  AIMS (if indicated):     Assets:  Communication Skills Desire for Improvement Financial Resources/Insurance Housing Resilience Social Support  ADL's:  Intact  Cognition:  WNL  Sleep:  Number of Hours: 8.45     Treatment Plan Summary: Daily contact with patient to assess and evaluate symptoms and progress in treatment and Medication management   Ms. Trimmer is a 56 year old female with history of depression admitted for worsening of her symptoms, paranoia and suicidal threats in the context of treatment noncompliance.  *   Agitation. Haldol, Ativan and Benadryl are available per forced medication order.  1. Suicidal ideation. The patient is able to contract for safety in the hospital.  2. Mood, anxiety and psychosis. We started Zyprexa and Haldol for psychosis, Depakote for mood stabilization and Luvox for depression, anxiety, and trichotillomania. She is on forced medications now and refuses oral  meds. VPA level 20, ammonia low on 10/10/2016.   3. Hypertension. She is on lisinopril, furosemide, and potassium. Blood pressure is normal.   4. Coronary artery disease. She is on Lipitor and nitroglycerin. We will order EKG when the patient is calmer.  5. GERD. She is on Protonix.  6. COPD. She is on [place].  7. Urinary retention. She is on Flomax.  8. Insomnia. Trazodone is available.  9. Metabolic syndrome monitoring. Lipid panel and TSH are normal. Hemoglobin A1c 6.3.    10. B12 deficiency. B12 level is low. We tried B12 injections but the patient  refuses.  11. Disposition. She will be discharged to home with her husband. She will follow up with mental health professionals.  Orson Slick, MD 10/19/2016, 5:08 PM   Patient ID: Kirsten Castro, female   DOB: [birth year], 56 y.o.   MRN:  [id number] Patient ID: Kirsten Castro, female   DOB: 03/23/1961, 55 y.o.   MRN: [id number]  Blood pressure 125/75, pulse 88, temperature 97.8 F (36.6 C), temperature source Oral, resp. rate 18, height _0  (1.549 m), weight 71.2 kg (157 lb), SpO2 100 %.Body mass index is 29.66 kg/m.  General Appearance: Disheveled  Eye Contact:  Good  Speech:  Pressured  Volume:  Increased  Mood:  Anxious  Affect:  Congruent, anxious  Thought Process:  Disorganized and Descriptions of Associations: Loose  Orientation:  Full (Time, Place, and Person)  Thought Content:  Illogical, Delusions, Paranoid Ideation and Rumination  Suicidal Thoughts:  denies  Homicidal Thoughts:  No  Memory:  Immediate;   Poor Recent;   Poor Remote;   Poor  Judgement:  Poor  Insight:  Lacking  Psychomotor Activity:  Increased  Concentration:  Concentration: Poor  Recall:  Poor  Fund of Knowledge:  Fair  Language:  Fair  Akathisia:  No  Handed:  Right  AIMS (if indicated):     Assets:  Communication Skills Desire for Improvement Financial Resources/Insurance Housing Resilience Social Support  ADL's:  Intact  Cognition:  WNL  Sleep:  Number of Hours: 8.45     Treatment Plan Summary: Daily contact with patient to assess and evaluate symptoms and progress in treatment and Medication management   [name] is a 56 year old female with history of depression admitted for worsening of her symptoms, paranoia and suicidal threats in the context of treatment noncompliance.  *   Agitation. Haldol, Ativan and Benadryl are available per forced medication order.  1. Suicidal ideation. The patient is able to contract for safety in the hospital.  2. Mood, anxiety and psychosis. We started Zyprexa and Haldol for psychosis, Depakote for mood stabilization and Luvox for depression, anxiety, and trichotillomania. She is on forced medications now and refuses oral  meds. VPA level 20, ammonia low on 10/10/2016. VPA level in  am.  3. Hypertension. She is on lisinopril, furosemide, and potassium. Blood pressure is normal.   4. Coronary artery disease. She is on Lipitor and nitroglycerin. We will order EKG when the patient is calmer.  5. GERD. She is on Protonix.  6. COPD. She is on [place].  7. Urinary retention. She is on Flomax.  8. Insomnia. Trazodone is available.  9. Metabolic syndrome monitoring. Lipid panel and TSH are normal. Hemoglobin A1c 6.3.    10. B12 deficiency. B12 level is low. We tried B12 injections but the patient refuses.  11. ECT Dr. Weber Cooks discussed with the patient possibility of ECT to treat her severe depression. Patient listened with some attention but made no comment. Was not ready to agreed to treatment plan. Still has limited insight.   12. Disposition. Family meeting tomorrow. She will be discharged to home with her husband. She will follow up with mental health professionals.   Orson Slick, MD 10/19/2016, 5:08 PM

## 2016-10-19 NOTE — Plan of Care (Signed)
Problem: Activity: Goal: Interest or engagement in activities will improve Outcome: Not Progressing Not involved in unit activities. Suspicious and paranoid

## 2016-10-19 NOTE — Plan of Care (Signed)
Problem: Coping: Goal: Ability to cope will improve Outcome: Not Progressing Guarded, isolative in room, avoiding others.

## 2016-10-19 NOTE — Progress Notes (Signed)
Recreation Therapy Notes  Date: 01.18.18 Time: 9:30 am Location: Craft Room  Group Topic: Leisure Education  Goal Area(s) Addresses:  Patient will identify things they are grateful for. Patient will identify how being grateful can influence decision making.  Behavioral Response: Did not attend  Intervention: Grateful Wheel  Activity: Patients were given an I Am Grateful For worksheet and were instructed to write things they are grateful for under each category.  Education: LRT educated patients on leisure and why it is important.  Education Outcome: Patient did not attend group.   Clinical Observations/Feedback: Patient did not attend group.  Leonette Monarch, LRT/CTRS 10/19/2016 10:17 AM

## 2016-10-19 NOTE — Progress Notes (Signed)
D: Pt denies SI/HI/AVH. Pt is pleasant and cooperative, affect is flat. Patient appears to confused at times to place and time, patient was often reoriented and redirected. Pt  appears less anxious and she is interacting with peers and staff appropriately. No psychosis or bizarre behavior noted. Pt was given scheduled medications,and encouraged to attend groups. Q 15 minute checks were done for safety.  R: Pt did not attend group. Pt is compliant with medication. Pt has no complaints.Pt receptive to treatment and safety maintained on unit.

## 2016-10-20 MED ORDER — NITROGLYCERIN 0.4 MG SL SUBL: 0.4000 mg | SUBLINGUAL_TABLET | SUBLINGUAL | 12 refills | Status: AC | PRN

## 2016-10-20 MED ORDER — TRAZODONE HCL 100 MG PO TABS: 100.0000 mg | ORAL_TABLET | Freq: Every day | ORAL | 1 refills | Status: AC

## 2016-10-20 MED ORDER — DIVALPROEX SODIUM 500 MG PO DR TAB: 500.0000 mg | DELAYED_RELEASE_TABLET | Freq: Two times a day (BID) | ORAL | 1 refills | Status: DC

## 2016-10-20 MED ORDER — HALOPERIDOL 10 MG PO TABS: 10.0000 mg | ORAL_TABLET | Freq: Every day | ORAL | 1 refills | Status: DC

## 2016-10-20 MED ORDER — CYANOCOBALAMIN 1000 MCG PO TABS: 1000.0000 ug | ORAL_TABLET | Freq: Every day | ORAL | 1 refills | Status: AC

## 2016-10-20 MED ORDER — NITROGLYCERIN 0.4 MG SL SUBL: 0.4000 mg | SUBLINGUAL_TABLET | SUBLINGUAL | 12 refills | Status: DC | PRN

## 2016-10-20 MED ORDER — CYANOCOBALAMIN 1000 MCG PO TABS: 1000.0000 ug | ORAL_TABLET | Freq: Every day | ORAL | 1 refills | Status: DC

## 2016-10-20 MED ORDER — MOMETASONE FURO-FORMOTEROL FUM 200-5 MCG/ACT IN AERO: 2.0000 | INHALATION_SPRAY | Freq: Two times a day (BID) | RESPIRATORY_TRACT | 1 refills | Status: AC

## 2016-10-20 MED ORDER — MOMETASONE FURO-FORMOTEROL FUM 200-5 MCG/ACT IN AERO: 2.0000 | INHALATION_SPRAY | Freq: Two times a day (BID) | RESPIRATORY_TRACT | 1 refills | Status: DC

## 2016-10-20 MED ORDER — TRAZODONE HCL 100 MG PO TABS: 100.0000 mg | ORAL_TABLET | Freq: Every day | ORAL | 1 refills | Status: DC

## 2016-10-20 MED ORDER — DIVALPROEX SODIUM 500 MG PO DR TAB: 500.0000 mg | DELAYED_RELEASE_TABLET | Freq: Two times a day (BID) | ORAL | 1 refills | Status: AC

## 2016-10-20 MED ORDER — FLUVOXAMINE MALEATE 100 MG PO TABS: 200.0000 mg | ORAL_TABLET | Freq: Every day | ORAL | 1 refills | Status: AC

## 2016-10-20 MED ORDER — HALOPERIDOL 10 MG PO TABS: 10.0000 mg | ORAL_TABLET | Freq: Every day | ORAL | 1 refills | Status: AC

## 2016-10-20 MED ORDER — FLUVOXAMINE MALEATE 100 MG PO TABS: 200.0000 mg | ORAL_TABLET | Freq: Every day | ORAL | 1 refills | Status: DC

## 2016-10-20 NOTE — BHH Suicide Risk Assessment (Signed)
[facility] Discharge Suicide Risk Assessment   Principal Problem: Severe recurrent major depression with psychotic features [facility]) Discharge Diagnoses:  Patient Active Problem List   Diagnosis Date Noted  . Severe recurrent major depression with psychotic features ([place]) [F33.3] 10/02/2016  . Benzodiazepine withdrawal ([place]) [F13.239] 10/02/2016  . 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 Abdominal pain [R10.9] 04/19/2016  . Sepsis ([place]) [A41.9] 03/09/2016  . Urinary retention [R33.9] 03/09/2016  . CAD (coronary artery disease) [I25.10] 03/09/2016  . Frank hematuria [R31.0] 03/08/2016  . Temporary cerebral vascular dysfunction [G93.9] 02/03/2016  . Peripheral vascular disease ([place]) [I73.9] 02/03/2016  . HTN (hypertension) [I10] 02/03/2016  . HLD (hyperlipidemia) [E78.5] 02/03/2016  . Clinical depression [F32.9] 02/03/2016  . Carotid artery narrowing [I65.29] 02/03/2016  . 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 Malignant neoplastic disease ([place]) [C80.1] 02/03/2016  . Anxiety [F41.9] 02/03/2016  . Abdominal pain, generalized [R10.84]   . Nausea with vomiting [R11.2]   . Gastritis [K29.70]   . Esophageal candidiasis ([place]) [B37.81]   . Noninfectious diarrhea [K52.9]   . Intractable pain [R52] 01/21/2016  . Upper abdominal pain [R10.10]   . Nausea and vomiting in adult [R11.2]   . Ischemic colitis ([place]) [K55.9] 01/01/2016  . Colitis [K52.9] 12/15/2015  . GI bleed [K92.2] 12/15/2015  . Acute inflammation of the pancreas [K85.90] 12/06/2015  . Vitamin B12 deficiency anemia [D51.9] 02/25/2015  . Absolute anemia [D64.9] 02/25/2015  . Carotid stenosis [I65.29] 02/10/2015  . Personal history of other specified conditions [Z87.898] 11/27/2014  . Osteomyelitis ([place]) [M86.9] 11/27/2014  . H/O coronary artery bypass surgery [Z95.1] 05/18/2014    Total Time spent with patient: 30 minutes  Musculoskeletal: Strength & Muscle Tone: within normal limits Gait &  Station: normal Patient leans: N/A  Psychiatric Specialty Exam: Review of Systems  Psychiatric/Behavioral: Negative for hallucinations.  All other systems reviewed and are negative.   Blood pressure 112/70, pulse (!) 107, temperature 98 F (36.7 C), temperature source Oral, resp. rate 18, height 5\' 1"  (1.549 m), weight 71.2 kg (157 lb), SpO2 100 %.Body mass index is 29.66 kg/m.  General Appearance: Fairly Groomed  Engineer, water::  Good  Speech:  Clear and Coherent409  Volume:  Normal  Mood:  Anxious  Affect:  Appropriate  Thought Process:  Goal Directed and Descriptions of Associations: Intact  Orientation:  Full (Time, Place, and Person)  Thought Content:  Delusions and Paranoid Ideation  Suicidal Thoughts:  No  Homicidal Thoughts:  No  Memory:  Immediate;   Fair Recent;   Fair Remote;   Fair  Judgement:  Impaired  Insight:  Shallow  Psychomotor Activity:  Normal  Concentration:  Fair  Recall:  [place]  Language: Fair  Akathisia:  No  Handed:  Right  AIMS (if indicated):     Assets:  Communication Skills Desire for Improvement Financial Resources/Insurance Housing Resilience Social Support  Sleep:  Number of Hours: 7.5  Cognition: WNL  ADL's:  Intact   Mental Status Per Nursing Assessment::   On Admission:  Suicidal ideation indicated by others  Demographic Factors:  Caucasian  Loss Factors: NA  Historical Factors: Impulsivity  Risk Reduction Factors:   Sense of responsibility to family, Living with another person, especially a relative and Positive social support  Continued Clinical Symptoms:  Depression:   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  Patients presenting with  no risk factors but with morbid ruminations; may be classified as minimal risk based on the severity of the depressive  symptoms    Plan Of Care/Follow-up recommendations:  Activity:  As tolerated. Diet:  Low sodium heart healthy. Other:  Keep follow-up appointments.  Orson Slick, MD 10/20/2016, 10:30 AM

## 2016-10-20 NOTE — Progress Notes (Signed)
Denies SI/HI/AVH.   Discharge instructions reviewed with patient and spouse. Both verbalized understanding.  Personal belongings returned.  Escorted to main entrance by this Probation officer to travel home.

## 2016-10-20 NOTE — Progress Notes (Signed)
  [facility] Adult Case Management Discharge Plan :  Will you be returning to the same living situation after discharge:  Yes,  with husband At discharge, do you have transportation home?: Yes,  husband Do you have the ability to pay for your medications: Yes,  private insurance  Release of information consent forms completed and in the chart;  Patient's signature needed at discharge.  Patient to Follow up at: [place] Follow up on 10/24/2016.   Why:  Follow-up appointment on this date at 8:00am for outpatient services. Please bring photo I.D., insurance information, discharge summary, and current medications to this appointment.  Contact information: [place] [address] [phone]           Next level of care provider has access to [place] and Suicide Prevention discussed: Yes,  with husband  Have you used any form of tobacco in the last 30 days? (Cigarettes, Smokeless Tobacco, Cigars, and/or Pipes): No  Has patient been referred to the Quitline?: Patient refused referral  Patient has been referred for addiction treatment: N/A  Joanne Chars, LCSW 10/20/2016, 2:34 PM

## 2016-10-20 NOTE — BHH Suicide Risk Assessment (Signed)
[facility] Discharge Suicide Risk Assessment   Principal Problem: Severe recurrent major depression with psychotic features [facility]) Discharge Diagnoses:  Patient Active Problem List   Diagnosis Date Noted  . Severe recurrent major depression with psychotic features ([place]) [F33.3] 10/02/2016  . Benzodiazepine withdrawal ([place]) [F13.239] 10/02/2016  . 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 Abdominal pain [R10.9] 04/19/2016  . Sepsis ([place]) [A41.9] 03/09/2016  . Urinary retention [R33.9] 03/09/2016  . CAD (coronary artery disease) [I25.10] 03/09/2016  . Frank hematuria [R31.0] 03/08/2016  . Temporary cerebral vascular dysfunction [G93.9] 02/03/2016  . Peripheral vascular disease ([place]) [I73.9] 02/03/2016  . HTN (hypertension) [I10] 02/03/2016  . HLD (hyperlipidemia) [E78.5] 02/03/2016  . Clinical depression [F32.9] 02/03/2016  . Carotid artery narrowing [I65.29] 02/03/2016  . 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 Malignant neoplastic disease ([place]) [C80.1] 02/03/2016  . Anxiety [F41.9] 02/03/2016  . Abdominal pain, generalized [R10.84]   . Nausea with vomiting [R11.2]   . Gastritis [K29.70]   . Esophageal candidiasis ([place]) [B37.81]   . Noninfectious diarrhea [K52.9]   . Intractable pain [R52] 01/21/2016  . Upper abdominal pain [R10.10]   . Nausea and vomiting in adult [R11.2]   . Ischemic colitis ([place]) [K55.9] 01/01/2016  . Colitis [K52.9] 12/15/2015  . GI bleed [K92.2] 12/15/2015  . Acute inflammation of the pancreas [K85.90] 12/06/2015  . Vitamin B12 deficiency anemia [D51.9] 02/25/2015  . Absolute anemia [D64.9] 02/25/2015  . Carotid stenosis [I65.29] 02/10/2015  . Personal history of other specified conditions [Z87.898] 11/27/2014  . Osteomyelitis ([place]) [M86.9] 11/27/2014  . H/O coronary artery bypass surgery [Z95.1] 05/18/2014    Total Time spent with patient: 30 minutes  Musculoskeletal: Strength & Muscle Tone: within normal limits Gait &  Station: normal Patient leans: N/A  Psychiatric Specialty Exam: Review of Systems  Psychiatric/Behavioral: Negative for hallucinations.  All other systems reviewed and are negative.   Blood pressure 112/70, pulse (!) 107, temperature 98 F (36.7 C), temperature source Oral, resp. rate 18, height 5\' 1"  (1.549 m), weight 71.2 kg (157 lb), SpO2 100 %.Body mass index is 29.66 kg/m.  General Appearance: Fairly Groomed  Engineer, water::  Good  Speech:  Clear and Coherent409  Volume:  Normal  Mood:  Anxious  Affect:  Appropriate  Thought Process:  Goal Directed and Descriptions of Associations: Intact  Orientation:  Full (Time, Place, and Person)  Thought Content:  Delusions and Paranoid Ideation  Suicidal Thoughts:  No  Homicidal Thoughts:  No  Memory:  Immediate;   Fair Recent;   Fair Remote;   Fair  Judgement:  Impaired  Insight:  Shallow  Psychomotor Activity:  Normal  Concentration:  Fair  Recall:  [place]  Language: Fair  Akathisia:  No  Handed:  Right  AIMS (if indicated):     Assets:  Communication Skills Desire for Improvement Financial Resources/Insurance Housing Resilience Social Support  Sleep:  Number of Hours: 7.5  Cognition: WNL  ADL's:  Intact   Mental Status Per Nursing Assessment::   On Admission:  Suicidal ideation indicated by others  Demographic Factors:  Caucasian  Loss Factors: NA  Historical Factors: Impulsivity  Risk Reduction Factors:   Sense of responsibility to family, Living with another person, especially a relative and Positive social support  Continued Clinical Symptoms:  Depression:   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  Patients presenting with  no risk factors but with morbid ruminations; may be classified as minimal risk based on the severity of the depressive  symptoms    Plan Of Care/Follow-up recommendations:  Activity:  As tolerated. Diet:  Low sodium heart healthy. Other:  Keep follow-up appointments.  Orson Slick, MD 10/20/2016, 7:36 AM

## 2016-10-20 NOTE — Discharge Summary (Signed)
Physician Discharge Summary Note  Patient:  Kirsten Castro is an 56 y.o., female MRN:  [id number] DOB:  January 06, 1961 Patient phone:  [phone] (home)  Patient address:   [address] [place] [address],  Total Time spent with patient: 30 minutes  Date of Admission:  10/02/2016 Date of Discharge: 10/20/2016  Reason for Admission:  Psychosis.  Identifying data. Kirsten Castro is a 56 year old female with a history of depression.  Chief complaint. "I have more water in me than food."  History of present illness. Information was obtained from the patient and the chart. The patient herself is unable to provide much information. She is very disorganized and anxious.. She standing in the hallway grunting and panting. She comes to my office but is unable to sit down. She wants to go home and wants to call her husband right away. She is looking constantly at her body parts: her feet, her arms, her abdomen and complaining of too much water. The patient does have a history of coronary artery hypertension but her chest x-ray was normal yesterday. There is no EKG on admission but we will order that. Her blood pressure is slightly elevated with normal heart rate. The patient was not given lisinopril and furosemide in the emergency room yesterday, which may be responsible for some of her complaints. She believes that something is stuck in her throat she therefore is unable to drink or eat. She refused all her pills today. The patient denies any symptoms of depression, anxiety, or psychosis. According to her notes from the emergency room the patient was brought to the hospital by her husband who noticed confusion and changes in her behavior, including hair pulling, for the past 2 weeks. In the emergency room, she was paranoid, delusional, hallucinating, seeing demons. She also expressed suicidal thoughts. She sounded hopeless feeling that nothing would help and that she was dying. Reportedly she stopped taking  her medications 3 days ago. This includes Xanax 1 mg three times a day that she has been taking for extended period of time. It was thought that some of her symptoms may be related to benzodiazepine withdrawal as she was restarted on Xanax which she refused this morning. The patient denies, and there is no evidence, that she uses alcohol or substances.  While in the emergency room. The patient was given a dose of Ativan before head CT scan and apparently was able to participate in the interview with Dr. Weber Cooks. She reported depressed mood for several months with poor sleep and decreased appetite. She also talked to the doctor about demons.  Past psychiatry history. The patient has a long history of depression and has been treated with multiple antidepressants but remembers only Zoloft. She's been on the Xanax for years. There is one suicide attempt by overdose many years ago for which she was not hospitalized.  Family psychiatric history. Nonreported.  Social history. She is disabled from multiple medical problems. She lives with her husband who is still unemployed. They are originally from [place] where most of her family still resides. The patient has been upset that she has not been able to visit with them.  Principal Problem: Severe recurrent major depression with psychotic features [facility]) Discharge Diagnoses: Patient Active Problem List   Diagnosis Date Noted  . Severe recurrent major depression with psychotic features ([place]) [F33.3] 10/02/2016  . Benzodiazepine withdrawal ([facility]) [F13.239] 10/02/2016  . 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 Abdominal pain [R10.9] 04/19/2016  . Sepsis ([place]) [A41.9] 03/09/2016  . Urinary  retention [R33.9] 03/09/2016  . CAD (coronary artery disease) [I25.10] 03/09/2016  . Frank hematuria [R31.0] 03/08/2016  . Temporary cerebral vascular dysfunction [G93.9] 02/03/2016  . Peripheral vascular disease ([place]) [I73.9] 02/03/2016  . HTN (hypertension) [I10]  02/03/2016  . HLD (hyperlipidemia) [E78.5] 02/03/2016  . Clinical depression [F32.9] 02/03/2016  . Carotid artery narrowing [I65.29] 02/03/2016  . 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 Malignant neoplastic disease ([place]) [C80.1] 02/03/2016  . Anxiety [F41.9] 02/03/2016  . Abdominal pain, generalized [R10.84]   . Nausea with vomiting [R11.2]   . Gastritis [K29.70]   . Esophageal candidiasis ([place]) [B37.81]   . Noninfectious diarrhea [K52.9]   . Intractable pain [R52] 01/21/2016  . Upper abdominal pain [R10.10]   . Nausea and vomiting in adult [R11.2]   . Ischemic colitis ([place]) [K55.9] 01/01/2016  . Colitis [K52.9] 12/15/2015  . GI bleed [K92.2] 12/15/2015  . Acute inflammation of the pancreas [K85.90] 12/06/2015  . Vitamin B12 deficiency anemia [D51.9] 02/25/2015  . Absolute anemia [D64.9] 02/25/2015  . Carotid stenosis [I65.29] 02/10/2015  . Personal history of other specified conditions [Z87.898] 11/27/2014  . Osteomyelitis ([place]) [M86.9] 11/27/2014  . H/O coronary artery bypass surgery [Z95.1] 05/18/2014    Past Medical History:  Past Medical History:  Diagnosis Date  . Anemia   . Anginal pain ([place])   . Anxiety   . 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  This was done at [facility]  . BP (high blood pressure) 02/03/2016  . Cancer ([place])    melanoma skin cancer  . Carotid stenosis 02/10/2015  . Chronic kidney disease    UTI  . Colitis 12/15/2015  . Collagen vascular disease ([place])   . COPD (chronic obstructive pulmonary disease) ([place])   . Coronary artery  disease   . Depression   . Esophageal candidiasis ([place])   . Gastritis   . GERD (gastroesophageal reflux disease)   . GI bleed 12/15/2015  . Headache   . Hypertension   . Ischemic colitis ([place]) 01/01/2016   Overview:   SMA mesenteric ischemia now status post angioplasty by vascular of In stent restenosis.   Overview:  S?P SMA angioplasty   . Myocardial infarction   . Nausea and vomiting in adult   . Noninfectious diarrhea   . Peripheral vascular disease ([place]) 02/03/2016   Overview:  Follows with Dr. Delana Meyer    . Shortness of breath dyspnea    with exertion  . Stroke [facility])    TIA X 2  . Temporary cerebral vascular dysfunction 02/03/2016    Past Surgical History:  Procedure Laterality Date  . ABDOMINAL HYSTERECTOMY    . BACK SURGERY    . BREAST BIOPSY Left 6 2017   results not in yet  . COLONOSCOPY WITH PROPOFOL N/A 01/24/2016   Procedure: COLONOSCOPY WITH PROPOFOL;  Surgeon: Lucilla Lame, MD;  Location: [facility] ENDOSCOPY;  Service: Endoscopy;  Laterality: N/A;  . CORONARY ANGIOPLASTY    . CORONARY ARTERY BYPASS GRAFT    . ESOPHAGOGASTRODUODENOSCOPY (EGD) WITH PROPOFOL N/A 01/24/2016   Procedure: ESOPHAGOGASTRODUODENOSCOPY (EGD)  WITH PROPOFOL;  Surgeon: Lucilla Lame, MD;  Location[facility] ENDOSCOPY;  Service: Endoscopy;  Laterality: N/A;  . KYPHOPLASTY N/A 02/08/2016   Procedure: KYPHOPLASTY L1;  Surgeon: Hessie Knows, MD;  Location: [facility] ORS;  Service: Orthopedics;  Laterality: N/A;  . KYPHOPLASTY N/A 03/28/2016   Procedure: KYPHOPLASTY;  Surgeon: Hessie Knows, MD;  Location: [facility] ORS;  Service: Orthopedics;  Laterality: N/A;  . NOSE SURGERY     cancer removal  . OTHER SURGICAL HISTORY  Jul 11 2014   sternum removal  . PERIPHERAL VASCULAR CATHETERIZATION N/A 02/10/2015   Procedure: Carotid PTA/Stent Intervention;  Surgeon: Katha Cabal, MD;  Location: [place] CV LAB;  Service: Cardiovascular;  Laterality: N/A;  . PERIPHERAL VASCULAR CATHETERIZATION N/A 12/17/2015   Procedure: Visceral  Angiography;  Surgeon: Katha Cabal, MD;  Location: [place] CV LAB;  Service: Cardiovascular;  Laterality: N/A;  . PERIPHERAL VASCULAR CATHETERIZATION N/A 12/17/2015   Procedure: Visceral Artery Intervention;  Surgeon: Katha Cabal, MD;  Location: [place] CV LAB;  Service: Cardiovascular;  Laterality: N/A;  . STENT PLACEMENT VASCULAR ([place] [facility])     Family History:  Family History  Problem Relation Age of Onset  . Breast cancer Sister   . CAD Father   . CAD Brother     Social History:  History  Alcohol Use No     History  Drug Use No    Social History   Social History  . Marital status: Married    Spouse name: N/A  . Number of children: N/A  . Years of education: N/A   Social History Main Topics  . Smoking status: Former Smoker    Packs/day: 1.00    Types: Cigarettes    Quit date: 08/01/2012  . Smokeless tobacco: Never Used  . Alcohol use No  . Drug use: No  . Sexual activity: Yes   Other Topics Concern  . None   Social History Narrative  . None    Hospital Course:    Ms. Montecillo is a 56 year old female with history of depression admitted for worsening of her symptoms, paranoia and suicidal threats in the context of treatment noncompliance.  1. Suicidal ideation. Resolved. The patient is able to contract for safety. She is forward thinking and more optimistic about the future.   2. Mood, anxiety and psychosis. The patient ws treated with Zyprexa and transitioned to Haldol for psychosis, Depakote for mood stabilization and Luvox for depression, anxiety, and trichotillomania. She initially refused medications and was placed on forced medications. She now accepts medications as prescribed. VPA level 71, ammonia 35.  3. Hypertension. She is on lisinopril, furosemide, and potassium. Blood pressure is normal.   4. Coronary artery disease. She is on Lipitor and nitroglycerin. The patient has refused EKG.  5. GERD. She is on Protonix.  6.  COPD. She is on Esko.  7. Urinary retention. She is on Flomax.  8. Insomnia. Trazodone is available.  9. Metabolic syndrome monitoring. Lipid panel and TSH are normal. Hemoglobin A1c 6.3.    10. B12 deficiency. B12 level is low. The patient received B12 injections in the hospital. She is on oral supplementation now.  11. ECT. Her recovery was very slow. Dr. Weber Cooks spoke with the patient about ECT to treat severe depression. The patient was not ready to agree to treatment.    12. Head CT scan was unremarkable.   13. EKG. Normal sinus rhythm. QTC 444.  14. Disposition. Family meeting completed. The patient will be  discharged to home with her husband. She will follow up with [facility].    Physical Findings: AIMS:  , ,  ,  ,    CIWA:    COWS:     Musculoskeletal: Strength & Muscle Tone: within normal limits Gait & Station: normal Patient leans: N/A  Psychiatric Specialty Exam: Physical Exam  Nursing note and vitals reviewed. Psychiatric: Her speech is normal. Her mood appears anxious. She is withdrawn. Thought content is paranoid. Cognition and memory are normal. She expresses impulsivity.    Review of Systems  All other systems reviewed and are negative.   Blood pressure 112/70, pulse (!) 107, temperature 98 F (36.7 C), temperature source Oral, resp. rate 18, height 5\' 1"  (1.549 m), weight 71.2 kg (157 lb), SpO2 100 %.Body mass index is 29.66 kg/m.  General Appearance: Casual  Eye Contact:  Good  Speech:  Clear and Coherent  Volume:  Normal  Mood:  Anxious  Affect:  Appropriate  Thought Process:  Goal Directed and Descriptions of Associations: Intact  Orientation:  Full (Time, Place, and Person)  Thought Content:  Delusions and Paranoid Ideation  Suicidal Thoughts:  No  Homicidal Thoughts:  No  Memory:  Immediate;   Fair Recent;   Fair Remote;   Fair  Judgement:  Impaired  Insight:  Shallow  Psychomotor Activity:  Decreased  Concentration:  Concentration:  Fair and Attention Span: Fair  Recall:  [employer] of Knowledge:  Fair  Language:  Fair  Akathisia:  No  Handed:  Right  AIMS (if indicated):     Assets:  Communication Skills Desire for Improvement Financial Resources/Insurance Housing Intimacy Resilience Social Support  ADL's:  Intact  Cognition:  WNL  Sleep:  Number of Hours: 7.5     Have you used any form of tobacco in the last 30 days? (Cigarettes, Smokeless Tobacco, Cigars, and/or Pipes): No  Has this patient used any form of tobacco in the last 30 days? (Cigarettes, Smokeless Tobacco, Cigars, and/or Pipes) Yes, No  Blood Alcohol level:  Lab Results  Component Value Date   [facility] <5 10/02/2016   ETH <5 [id number]    Metabolic Disorder Labs:  Lab Results  Component Value Date   HGBA1C 6.3 (H) 10/10/2016   MPG 134 10/10/2016   No results found for: PROLACTIN Lab Results  Component Value Date   CHOL 134 10/03/2016   TRIG 88 10/03/2016   HDL 53 10/03/2016   CHOLHDL 2.5 10/03/2016   VLDL 18 10/03/2016   LDLCALC 63 10/03/2016    See Psychiatric Specialty Exam and Suicide Risk Assessment completed by Attending Physician prior to discharge.  Discharge destination:  Home  Is patient on multiple antipsychotic therapies at discharge:  No   Has Patient had three or more failed trials of antipsychotic monotherapy by history:  No  Recommended Plan for Multiple Antipsychotic Therapies: NA  Discharge Instructions    Diet - low sodium heart healthy    Complete by:  As directed    Increase activity slowly    Complete by:  As directed        Follow-up recommendations:  Activity:  as tolerated. Diet:  low sodium heart healthy. Other:  keep follow up appointments.  Comments:    Signed: Orson Slick, MD 10/20/2016, 10:30 AM

## 2016-10-20 NOTE — Progress Notes (Signed)
Recreation Therapy Notes  Date: 01.19.18 Time: 9:30 am Location: Craft Room  Group Topic: Coping Skills  Goal Area(s) Addresses:  Patient will participate in healthy coping skill. Patient will verbalize benefit of using art as a coping skill.  Behavioral Response: Did not attend  Intervention: Coloring  Activity: Patients were given coloring sheets and were instructed to think about what emotions they were feeling and what their minds were focused on.  Education: LRT educated patients on healthy coping skills.  Education Outcome: Patient did not attend group.   Clinical Observations/Feedback: Patient did not attend group.  Leonette Monarch, LRT/CTRS 10/20/2016 10:18 AM

## 2016-10-20 NOTE — Tx Team (Signed)
Interdisciplinary Treatment and Diagnostic Plan Update   10/20/2016 (Late Entry from 10/19/2016) Time of Session: 10:30am DAVYNE Castro MRN: [id number]  Principal Diagnosis: Severe recurrent major depression with psychotic features [facility])  Secondary Diagnoses: Principal Problem:   Severe recurrent major depression with psychotic features (Kirsten Castro) Active Problems:   HTN (hypertension)   Vitamin B12 deficiency anemia   Urinary retention   CAD (coronary artery disease)   Benzodiazepine withdrawal (HCC)   Trichotillomania   Current Medications:  Current Facility-Administered Medications  Medication Dose Route Frequency Provider Last Rate Last Dose  . acetaminophen (TYLENOL) tablet 650 mg  650 mg Oral Q6H PRN Gonzella Lex, MD   650 mg at 10/20/16 0805  . alum & mag hydroxide-simeth (MAALOX/MYLANTA) 200-200-20 MG/5ML suspension 30 mL  30 mL Oral Q4H PRN Gonzella Lex, MD      . amLODipine (NORVASC) tablet 10 mg  10 mg Oral Daily Clovis Fredrickson, MD   10 mg at 10/20/16 0806  . atorvastatin (LIPITOR) tablet 80 mg  80 mg Oral q1800 Gonzella Lex, MD   80 mg at 10/19/16 1653  . clopidogrel (PLAVIX) tablet 75 mg  75 mg Oral Daily Gonzella Lex, MD   75 mg at 10/20/16 0806  . divalproex (DEPAKOTE) DR tablet 500 mg  500 mg Oral Q12H [name], MD   500 mg at 10/20/16 0806  . fluvoxaMINE (LUVOX) tablet 200 mg  200 mg Oral QHS [name], MD   200 mg at 10/19/16 2123  . furosemide (LASIX) tablet 20 mg  20 mg Oral Daily [name], MD   20 mg at 10/20/16 0806  . haloperidol (HALDOL) tablet 10 mg  10 mg Oral QHS [name], MD      . LORazepam (ATIVAN) tablet 2 mg  2 mg Oral Once [name], MD      . magnesium hydroxide (MILK OF MAGNESIA) suspension 30 mL  30 mL Oral Daily PRN Gonzella Lex, MD      . mometasone-formoterol (DULERA) 200-5 MCG/ACT inhaler 2 puff  2 puff Inhalation BID Gonzella Lex, MD   2 puff at 10/20/16 0805  .  nitroGLYCERIN (NITROSTAT) SL tablet 0.4 mg  0.4 mg Sublingual Q5 min PRN Gonzella Lex, MD   0.4 mg at 10/14/16 2118  . potassium chloride SA (K-DUR,KLOR-CON) CR tablet 10 mEq  10 mEq Oral Daily Clovis Fredrickson, MD   10 mEq at 10/20/16 0806  . tamsulosin (FLOMAX) capsule 0.4 mg  0.4 mg Oral Daily [name], MD   0.4 mg at 10/20/16 0806  . traZODone (DESYREL) tablet 100 mg  100 mg Oral QHS Clovis Fredrickson, MD   100 mg at 10/19/16 2123  . vitamin B-12 (CYANOCOBALAMIN) tablet 1,000 mcg  1,000 mcg Oral Daily Clovis Fredrickson, MD   1,000 mcg at 10/20/16 [id number]   Current Outpatient Prescriptions  Medication Sig Dispense Refill  . acetaminophen (TYLENOL) 500 MG tablet Take 1,000 mg by mouth every 6 (six) hours as needed for mild pain or headache.    Marland Kitchen atorvastatin (LIPITOR) 80 MG tablet Take 80 mg by mouth daily.    . cetirizine (ZYRTEC) 10 MG tablet Take 10 mg by mouth daily. Reported on 02/08/2016    . clopidogrel (PLAVIX) 75 MG tablet Take 75 mg by mouth daily.     . clotrimazole (LOTRIMIN) 1 % cream APP EXT AA BID    . cyanocobalamin 1000 MCG tablet  Take 1 tablet (1,000 mcg total) by mouth daily. 30 tablet 1  . divalproex (DEPAKOTE) 500 MG DR tablet Take 1 tablet (500 mg total) by mouth every 12 (twelve) hours. 60 tablet 1  . Fluticasone-Salmeterol (ADVAIR DISKUS) 250-50 MCG/DOSE AEPB Inhale 1 puff into the lungs 2 (two) times daily.     . fluvoxaMINE (LUVOX) 100 MG tablet Take 2 tablets (200 mg total) by mouth at bedtime. 60 tablet 1  . furosemide (LASIX) 20 MG tablet Take 20 mg by mouth.    . haloperidol (HALDOL) 10 MG tablet Take 1 tablet (10 mg total) by mouth at bedtime. 30 tablet 1  . hydrocortisone (ANUSOL-HC) 2.5 % rectal cream Apply topically.    Marland Kitchen lisinopril (ZESTRIL) 10 MG tablet Take 1 tablet (10 mg total) by mouth daily. 60 tablet 1  . Magnesium 250 MG TABS Take 250 mg by mouth 2 (two) times daily.    . mometasone-formoterol (DULERA) 200-5 MCG/ACT AERO Inhale 2  puffs into the lungs 2 (two) times daily. 1 Inhaler 1  . nitroGLYCERIN (NITROSTAT) 0.4 MG SL tablet Place 1 tablet (0.4 mg total) under the tongue every 5 (five) minutes as needed for chest pain. 30 tablet 12  . pantoprazole (PROTONIX) 40 MG tablet Take 1 tablet (40 mg total) by mouth 2 (two) times daily. 60 tablet 1  . polyethylene glycol powder (GLYCOLAX/MIRALAX) powder     . potassium chloride (MICRO-K) 10 MEQ CR capsule Take 10 mEq by mouth 2 (two) times daily.     Marland Kitchen senna-docusate (SENOKOT-S) 8.6-50 MG tablet Take 1 tablet by mouth at bedtime as needed for mild constipation.    . tamsulosin (FLOMAX) 0.4 MG CAPS capsule TAKE ONE CAPSULE BY MOUTH DAILY WITH BREAKFAST    . traZODone (DESYREL) 100 MG tablet Take 1 tablet (100 mg total) by mouth at bedtime. 30 tablet 1   PTA Medications: No prescriptions prior to admission.    Patient Stressors: Health problems Medication change or noncompliance  Patient Strengths: Ability for insight Capable of independent living Supportive family/friends  Treatment Modalities: Medication Management, Group therapy, Case management,  1 to 1 session with clinician, Psychoeducation, Recreational therapy.   Physician Treatment Plan for Primary Diagnosis: Severe recurrent major depression with psychotic features ([place]) Long Term Goal(s): Improvement in symptoms so as ready for discharge NA   Short Term Goals: Ability to identify changes in lifestyle to reduce recurrence of condition will improve Ability to verbalize feelings will improve Ability to disclose and discuss suicidal ideas Ability to demonstrate self-control will improve Ability to identify and develop effective coping behaviors will improve Ability to maintain clinical measurements within normal limits will improve Compliance with prescribed medications will improve Ability to identify triggers associated with substance abuse/mental health issues will improve NA  Medication Management:  Evaluate patient's response, side effects, and tolerance of medication regimen.  Therapeutic Interventions: 1 to 1 sessions, Unit Group sessions and Medication administration.  Evaluation of Outcomes: Progressing  Physician Treatment Plan for Secondary Diagnosis: Principal Problem:   Severe recurrent major depression with psychotic features ([place]) Active Problems:   HTN (hypertension)   Vitamin B12 deficiency anemia   Urinary retention   CAD (coronary artery disease)   Benzodiazepine withdrawal (HCC)   Trichotillomania  Long Term Goal(s): Improvement in symptoms so as ready for discharge NA   Short Term Goals: Ability to identify changes in lifestyle to reduce recurrence of condition will improve Ability to verbalize feelings will improve Ability to disclose and discuss suicidal ideas  Ability to demonstrate self-control will improve Ability to identify and develop effective coping behaviors will improve Ability to maintain clinical measurements within normal limits will improve Compliance with prescribed medications will improve Ability to identify triggers associated with substance abuse/mental health issues will improve NA     Medication Management: Evaluate patient's response, side effects, and tolerance of medication regimen.  Therapeutic Interventions: 1 to 1 sessions, Unit Group sessions and Medication administration.  Evaluation of Outcomes: Progressing   RN Treatment Plan for Primary Diagnosis: Severe recurrent major depression with psychotic features ([place]) Long Term Goal(s): Knowledge of disease and therapeutic regimen to maintain health will improve  Short Term Goals: Compliance with prescribed medications will improve  Medication Management: RN will administer medications as ordered by provider, will assess and evaluate patient's response and provide education to patient for prescribed medication. RN will report any adverse and/or side effects to prescribing  provider.  Therapeutic Interventions: 1 on 1 counseling sessions, Psychoeducation, Medication administration, Evaluate responses to treatment, Monitor vital signs and CBGs as ordered, Perform/monitor CIWA, COWS, AIMS and Fall Risk screenings as ordered, Perform wound care treatments as ordered.  Evaluation of Outcomes: Progressing   LCSW Treatment Plan for Primary Diagnosis: Severe recurrent major depression with psychotic features ([place]) Long Term Goal(s): Safe transition to appropriate next level of care at discharge, Engage patient in therapeutic group addressing interpersonal concerns.  Short Term Goals: Engage patient in aftercare planning with referrals and resources and Increase skills for wellness and recovery  Therapeutic Interventions: Assess for all discharge needs, 1 to 1 time with Social worker, Explore available resources and support systems, Assess for adequacy in community support network, Educate family and significant other(s) on suicide prevention, Complete Psychosocial Assessment, Interpersonal group therapy.  Evaluation of Outcomes: Progressing   Progress in Treatment: Attending groups: No. Participating in groups: No. Taking medication as prescribed: Yes. Toleration medication: Yes. Family/Significant other contact made: Yes, individual(s) contacted:  husband Patient understands diagnosis: Yes. Discussing patient identified problems/goals with staff: Yes. Medical problems stabilized or resolved: Yes. Denies suicidal/homicidal ideation: Yes. Issues/concerns per patient self-inventory: No. Other: n/a  New problem(s) identified: None identified at this time.  New Short Term/Long Term Goal(s): None identified at this time.   Discharge Plan or Barriers: Patient will discharge home with husband and follow-up with [facility] for outpatient services.   Reason for Continuation of Hospitalization: discharge 10/20/2016  Estimated Length of Stay:discharge  10/20/2016  Attendees: Patient:Kirsten Castro 10/20/2016 2:34 PM  Physician: Dr. Orson Slick, MD 10/20/2016 2:34 PM  Nursing: Polly Cobia, RN 10/20/2016 2:34 PM  RN Care Manager: 10/20/2016 2:34 PM  Social Worker: Jen Mow. Satira Sark 10/20/2016 2:34 PM  Recreational Therapist: Leonette Monarch, LRT/CTRS 10/20/2016 2:34 PM  Other:  10/20/2016 2:34 PM  Other:  10/20/2016 2:34 PM  Other: 10/20/2016 2:34 PM    Scribe for Treatment Team: Jolaine Click, [place] 10/20/2016 2:37 PM

## 2016-10-31 LAB — HEMOGLOBIN A1C
Hgb A1c MFr Bld: 6.2 % — ABNORMAL HIGH (ref 4.8–5.6)
Mean Plasma Glucose: 131 mg/dL

## 2017-01-08 ENCOUNTER — Ambulatory Visit (INDEPENDENT_AMBULATORY_CARE_PROVIDER_SITE_OTHER): Payer: Self-pay | Admitting: Vascular Surgery

## 2017-01-08 ENCOUNTER — Encounter (INDEPENDENT_AMBULATORY_CARE_PROVIDER_SITE_OTHER): Payer: BLUE CROSS/BLUE SHIELD

## 2017-02-26 ENCOUNTER — Emergency Department
Admission: EM | Admit: 2017-02-26 | Discharge: 2017-02-26 | Disposition: A | Discharge status: Home / Self Care | Payer: BLUE CROSS/BLUE SHIELD | Attending: Emergency Medicine | Admitting: Emergency Medicine

## 2017-02-26 ENCOUNTER — Emergency Department: Payer: BLUE CROSS/BLUE SHIELD

## 2017-02-26 ENCOUNTER — Encounter: Payer: Self-pay | Admitting: Emergency Medicine

## 2017-02-26 DIAGNOSIS — I82401 Acute embolism and thrombosis of unspecified deep veins of right lower extremity: Secondary | ICD-10-CM

## 2017-02-26 DIAGNOSIS — Z79899 Other long term (current) drug therapy: Secondary | ICD-10-CM | POA: Diagnosis not present

## 2017-02-26 DIAGNOSIS — R4020 Unspecified coma: Secondary | ICD-10-CM | POA: Diagnosis not present

## 2017-02-26 DIAGNOSIS — N189 Chronic kidney disease, unspecified: Secondary | ICD-10-CM | POA: Insufficient documentation

## 2017-02-26 DIAGNOSIS — R2241 Localized swelling, mass and lump, right lower limb: Secondary | ICD-10-CM | POA: Diagnosis present

## 2017-02-26 DIAGNOSIS — I129 Hypertensive chronic kidney disease with stage 1 through stage 4 chronic kidney disease, or unspecified chronic kidney disease: Secondary | ICD-10-CM | POA: Diagnosis not present

## 2017-02-26 DIAGNOSIS — Z87891 Personal history of nicotine dependence: Secondary | ICD-10-CM | POA: Insufficient documentation

## 2017-02-26 DIAGNOSIS — Z85828 Personal history of other malignant neoplasm of skin: Secondary | ICD-10-CM | POA: Insufficient documentation

## 2017-02-26 DIAGNOSIS — I25810 Atherosclerosis of coronary artery bypass graft(s) without angina pectoris: Secondary | ICD-10-CM | POA: Insufficient documentation

## 2017-02-26 DIAGNOSIS — J449 Chronic obstructive pulmonary disease, unspecified: Secondary | ICD-10-CM | POA: Diagnosis not present

## 2017-02-26 LAB — CBC WITH DIFFERENTIAL/PLATELET
Basophils Absolute: 0 10*3/uL (ref 0–0.1)
Basophils Relative: 1 %
EOS PCT: 5 %
Eosinophils Absolute: 0.2 10*3/uL (ref 0–0.7)
HEMATOCRIT: 40.4 % (ref 35.0–47.0)
Hemoglobin: 13.6 g/dL (ref 12.0–16.0)
LYMPHS ABS: 1 10*3/uL (ref 1.0–3.6)
LYMPHS PCT: 22 %
MCH: 34.3 pg — AB (ref 26.0–34.0)
MCHC: 33.7 g/dL (ref 32.0–36.0)
MCV: 101.8 fL — AB (ref 80.0–100.0)
MONO ABS: 0.4 10*3/uL (ref 0.2–0.9)
MONOS PCT: 9 %
NEUTROS ABS: 2.9 10*3/uL (ref 1.4–6.5)
Neutrophils Relative %: 63 %
PLATELETS: 174 10*3/uL (ref 150–440)
RBC: 3.97 MIL/uL (ref 3.80–5.20)
RDW: 13.5 % (ref 11.5–14.5)
WBC: 4.6 10*3/uL (ref 3.6–11.0)

## 2017-02-26 LAB — COMPREHENSIVE METABOLIC PANEL
ALBUMIN: 3.7 g/dL (ref 3.5–5.0)
ALT: 11 U/L — ABNORMAL LOW (ref 14–54)
ANION GAP: 7 (ref 5–15)
AST: 18 U/L (ref 15–41)
Alkaline Phosphatase: 66 U/L (ref 38–126)
BUN: 24 mg/dL — ABNORMAL HIGH (ref 6–20)
CALCIUM: 9.4 mg/dL (ref 8.9–10.3)
CO2: 30 mmol/L (ref 22–32)
Chloride: 106 mmol/L (ref 101–111)
Creatinine, Ser: 0.91 mg/dL (ref 0.44–1.00)
GFR calc non Af Amer: >60 mL/min (ref >60)
GLUCOSE: 151 mg/dL — AB (ref 65–99)
POTASSIUM: 3.8 mmol/L (ref 3.5–5.1)
SODIUM: 143 mmol/L (ref 135–145)
TOTAL PROTEIN: 6.5 g/dL (ref 6.5–8.1)
Total Bilirubin: 0.5 mg/dL (ref 0.3–1.2)

## 2017-02-26 LAB — VALPROIC ACID LEVEL: Valproic Acid Lvl: <10 ug/mL — ABNORMAL LOW (ref 50.0–100.0)

## 2017-02-26 LAB — TROPONIN I: Troponin I: <0.03 ng/mL (ref <0.03)

## 2017-02-26 NOTE — ED Notes (Signed)
Pt taken to CT via stretcher.

## 2017-02-26 NOTE — ED Provider Notes (Signed)
[facility] Emergency Department Provider Note   ____________________________________________   First MD Initiated Contact with Patient 02/26/17 1236     (approximate)  I have reviewed the triage vital signs and the nursing notes.   HISTORY  Chief Complaint Leg Pain   HPI Kirsten Castro is a 56 y.o. female patient's having trouble with memory since having a heart surgery they went wrong several years ago. She's been having some leg swelling right greater than left for the last several days. Get worse today. She has no fever she does have a little bit of chest pain uncertain if this is due to her previous infection which resulted in a sternectomy or something else. Pain in the chest is not severe just mild seems to be achy   Past Medical History:  Diagnosis Date  . Anemia   . Anginal pain ([place])   . Anxiety   . 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  This was done at [facility]  . BP (high blood pressure) 02/03/2016  . Cancer ([place])    melanoma skin cancer  . Carotid stenosis 02/10/2015  . Chronic kidney disease    UTI  . Colitis 12/15/2015  . Collagen vascular disease ([place])   . COPD (chronic obstructive pulmonary disease) ([place])   . Coronary artery disease   . Depression   . Esophageal candidiasis ([place])   . Gastritis   . GERD (gastroesophageal reflux disease)   . GI bleed 12/15/2015  . Headache   . Hypertension   . Ischemic colitis ([place]) 01/01/2016   Overview:   SMA mesenteric ischemia now status post angioplasty by vascular of In stent restenosis.   Overview:   S?P SMA angioplasty   . Myocardial infarction ([place])   . Nausea and vomiting in adult   . Noninfectious diarrhea   . Peripheral vascular disease ([place]) 02/03/2016   Overview:  Follows with Dr. Delana Meyer    . Shortness of breath dyspnea    with exertion  . Stroke [facility])    TIA X 2  . Temporary cerebral vascular dysfunction 02/03/2016    Patient Active Problem List   Diagnosis Date Noted  . Severe recurrent major depression with psychotic features ([place]) 10/02/2016  . Benzodiazepine withdrawal ([place]) 10/02/2016  . Trichotillomania 10/02/2016  . Abdominal pain 04/19/2016  . Sepsis ([place]) 03/09/2016  . Urinary retention 03/09/2016  . CAD (coronary artery disease) 03/09/2016  . Frank hematuria 03/08/2016  . Temporary cerebral vascular dysfunction 02/03/2016  . Peripheral vascular disease ([place]) 02/03/2016  . HTN (hypertension) 02/03/2016  . HLD (hyperlipidemia) 02/03/2016  . Clinical depression 02/03/2016  . Carotid artery narrowing 02/03/2016  . 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 Anxiety 02/03/2016  . Abdominal pain, generalized   . Nausea with vomiting   . Gastritis   . Esophageal candidiasis ([place])   . Noninfectious diarrhea   . Intractable pain 01/21/2016  . Upper abdominal pain   . Nausea and vomiting in adult   . Ischemic colitis ([place]) 01/01/2016  .  Colitis 12/15/2015  . GI bleed 12/15/2015  . Acute inflammation of the pancreas 12/06/2015  . Vitamin B12 deficiency anemia 02/25/2015  . Absolute anemia 02/25/2015  . Carotid stenosis 02/10/2015  . Personal history of other specified conditions 11/27/2014  . Osteomyelitis ([place]) 11/27/2014  . H/O coronary artery bypass surgery 05/18/2014    Past Surgical History:  Procedure Laterality Date  . ABDOMINAL HYSTERECTOMY    . BACK SURGERY    . BREAST BIOPSY Left 6 2017   results not in yet  . COLONOSCOPY WITH PROPOFOL N/A 01/24/2016   Procedure:  COLONOSCOPY WITH PROPOFOL;  Surgeon: Lucilla Lame, MD;  Location: [facility] ENDOSCOPY;  Service: Endoscopy;  Laterality: N/A;  . CORONARY ANGIOPLASTY    . CORONARY ARTERY BYPASS GRAFT    . ESOPHAGOGASTRODUODENOSCOPY (EGD) WITH PROPOFOL N/A 01/24/2016   Procedure: ESOPHAGOGASTRODUODENOSCOPY (EGD) WITH PROPOFOL;  Surgeon: Lucilla Lame, MD;  Location: [facility] ENDOSCOPY;  Service: Endoscopy;  Laterality: N/A;  . KYPHOPLASTY N/A 02/08/2016   Procedure: KYPHOPLASTY L1;  Surgeon: Hessie Knows, MD;  Location: [facility] ORS;  Service: Orthopedics;  Laterality: N/A;  . KYPHOPLASTY N/A 03/28/2016   Procedure: KYPHOPLASTY;  Surgeon: Hessie Knows, MD;  Location: [facility] ORS;  Service: Orthopedics;  Laterality: N/A;  . NOSE SURGERY     cancer removal  . OTHER SURGICAL HISTORY  Jul 11 2014   sternum removal  . PERIPHERAL VASCULAR CATHETERIZATION N/A 02/10/2015   Procedure: Carotid PTA/Stent Intervention;  Surgeon: Katha Cabal, MD;  Location: [place] CV LAB;  Service: Cardiovascular;  Laterality: N/A;  . PERIPHERAL VASCULAR CATHETERIZATION N/A 12/17/2015   Procedure: Visceral Angiography;  Surgeon: Katha Cabal, MD;  Location: [place] [place] [facility] CV LAB;  Service: Cardiovascular;  Laterality: N/A;  . PERIPHERAL VASCULAR CATHETERIZATION N/A 12/17/2015   Procedure: Visceral Artery Intervention;  Surgeon: Katha Cabal, MD;  Location: [place] CV LAB;  Service: Cardiovascular;  Laterality: N/A;  . STENT PLACEMENT VASCULAR ([place] HX)      Prior to Admission medications   Medication Sig Start Date End Date Taking? Authorizing Provider  acetaminophen (TYLENOL) 500 MG tablet Take 1,000 mg by mouth every 6 (six) hours as needed for mild pain or headache.    [provider]  atorvastatin (LIPITOR) 80 MG tablet Take 80 mg by mouth daily.    [provider]  cetirizine (ZYRTEC) 10 MG tablet Take 10 mg by mouth daily. Reported on 02/08/2016    [provider]  clopidogrel (PLAVIX) 75 MG tablet Take  75 mg by mouth daily.  02/02/16   [provider]  clotrimazole (LOTRIMIN) 1 % cream APP EXT AA BID 12/02/15   [provider]  cyanocobalamin 1000 MCG tablet Take 1 tablet (1,000 mcg total) by mouth daily. 10/20/16   Pucilowska, Herma Ard B, MD  divalproex (DEPAKOTE) 500 MG DR tablet Take 1 tablet (500 mg total) by mouth every 12 (twelve) hours. 10/20/16   Pucilowska, Jolanta B, MD  Fluticasone-Salmeterol (ADVAIR DISKUS) 250-50 MCG/DOSE AEPB Inhale 1 puff into the lungs 2 (two) times daily.     [provider]  fluvoxaMINE (LUVOX) 100 MG tablet Take 2 tablets (200 mg total) by mouth at bedtime. 10/20/16   Pucilowska, Jolanta B, MD  furosemide (LASIX) 20 MG tablet Take 20 mg by mouth.    [provider]  haloperidol (HALDOL) 10 MG tablet Take 1 tablet (10 mg total) by mouth at bedtime. 10/20/16   Pucilowska, Jolanta B, MD  hydrocortisone (ANUSOL-HC) 2.5 % rectal cream Apply topically.  [provider]  lisinopril (ZESTRIL) 10 MG tablet Take 1 tablet (10 mg total) by mouth daily. 01/25/16   Henreitta Leber, MD  Magnesium 250 MG TABS Take 250 mg by mouth 2 (two) times daily.    [provider]  mometasone-formoterol (DULERA) 200-5 MCG/ACT AERO Inhale 2 puffs into the lungs 2 (two) times daily. 10/20/16   Pucilowska, Jolanta B, MD  nitroGLYCERIN (NITROSTAT) 0.4 MG SL tablet Place 1 tablet (0.4 mg total) under the tongue every 5 (five) minutes as needed for chest pain. 10/20/16   Pucilowska, Jolanta B, MD  pantoprazole (PROTONIX) 40 MG tablet Take 1 tablet (40 mg total) by mouth 2 (two) times daily. 01/25/16   Henreitta Leber, MD  polyethylene glycol powder (GLYCOLAX/MIRALAX) powder  01/09/14   [provider]  potassium chloride (MICRO-K) 10 MEQ CR capsule Take 10 mEq by mouth 2 (two) times daily.     [provider]  senna-docusate (SENOKOT-S) 8.6-50 MG tablet Take 1 tablet by mouth at bedtime as needed for mild constipation. 03/11/16   Gouru,  Illene Silver, MD  tamsulosin (FLOMAX) 0.4 MG CAPS capsule TAKE ONE CAPSULE BY MOUTH DAILY WITH BREAKFAST 07/13/14   [provider]  traZODone (DESYREL) 100 MG tablet Take 1 tablet (100 mg total) by mouth at bedtime. 10/20/16   Pucilowska, Wardell Honour, MD    Allergies Ferrous sulfate and Sulfa antibiotics  Family History  Problem Relation Age of Onset  . Breast cancer Sister   . CAD Father   . CAD Brother     Social History Social History  Substance Use Topics  . Smoking status: Former Smoker    Packs/day: 1.00    Types: Cigarettes    Quit date: 08/01/2012  . Smokeless tobacco: Never Used  . Alcohol use No    Review of Systems  Constitutional: No fever/chills Eyes: No visual changes. ENT: No sore throat. Cardiovascular:  chest pain. Respiratory: Denies shortness of breath. Gastrointestinal: No abdominal pain.  No nausea, no vomiting.  No diarrhea.  No constipation. Genitourinary: Negative for dysuria. Musculoskeletal: Negative for back pain. Skin: Negative for rash. Neurological: Negative for headaches, focal weakness or numbness.  ____________________________________________   PHYSICAL EXAM:  VITAL SIGNS: ED Triage Vitals  Enc Vitals Group     BP 02/26/17 1033 112/82     Pulse Rate 02/26/17 1033 78     Resp 02/26/17 1033 20     Temp 02/26/17 1033 97.6 F (36.4 C)     Temp Source 02/26/17 1033 Oral     SpO2 02/26/17 1033 100 %     Weight 02/26/17 1032 149 lb (67.6 kg)     Height 02/26/17 1032 5\' 1"  (1.549 m)     Head Circumference --      Peak Flow --      Pain Score 02/26/17 1032 10     Pain Loc --      Pain Edu? --      Excl. in [place]? --    Constitutional: Alert and oriented.  Eyes: Conjunctivae are normal.  Head: Atraumatic. Nose: No congestion/rhinnorhea. Mouth/Throat: Mucous membranes are moist.  Oropharynx non-erythematous. Neck: No stridor. Cardiovascular: Normal rate, regular rhythm. Grossly normal heart sounds.  Good peripheral  circulation. Respiratory: Normal respiratory effort.  No retractions. Lungs CTAB. Gastrointestinal: Soft and nontender. No distention. No abdominal bruits. No CVA tenderness. Musculoskeletal: Right leg is edematous and slightly tender somewhat reddish left leg is slightly edematous not tender not really red No joint effusions.  Neurologic:  Slow speech and language. No new gross focal neurologic deficits are appreciated.  Skin:   No rash noted.   ____________________________________________   LABS (all labs ordered are listed, but only abnormal results are displayed)  Labs Reviewed  COMPREHENSIVE METABOLIC PANEL - Abnormal; Notable for the following:       Result Value   Glucose, Bld 151 (*)    BUN 24 (*)    ALT 11 (*)    All other components within normal limits  CBC WITH DIFFERENTIAL/PLATELET - Abnormal; Notable for the following:    MCV 101.8 (*)    MCH 34.3 (*)    All other components within normal limits  VALPROIC ACID LEVEL - Abnormal; Notable for the following:    Valproic Acid Lvl <10 (*)    All other components within normal limits  TROPONIN I  BRAIN NATRIURETIC PEPTIDE  URINALYSIS, COMPLETE (UACMP) WITH MICROSCOPIC   ____________________________________________  EKG   ____________________________________________  RADIOLOGY  IMPRESSION: The examination is positive for age-indeterminate occlusive DVT within 1 of the paired right peroneal veins. There is no extension of this distal calf DVT to the more proximal deep venous system of the right lower extremity.   Electronically Signed   By: Sandi Mariscal M.D.   On: 02/26/2017 14:05 __IMPRESSION: No evidence of acute intracranial abnormality.  Mild small vessel ischemic changes.   Electronically Signed   By: Julian Hy M.D.   On: 02/26/2017 14:46__________________________________________   PROCEDURES  Procedure(s) performed:   Procedures  Critical Care performed:   ____________________________________________   INITIAL IMPRESSION / ASSESSMENT AND PLAN / ED COURSE  Pertinent labs & imaging results that were available during my care of the patient were reviewed by me and considered in my medical decision making (see chart for details).  Discussed with Dr Lorenso Courier. Patient started taking aspirin baby aspirin and Plavix. Patient did fall a few days ago and again possibly 6 months ago. The cousin of the already partially anticoagulation and the falling and the fact that the DVT may be slightly older and has not propagated Dr. Lorenso Courier advises follow-up in 5-10 days in the office for repeat ultrasound.      ____________________________________________   FINAL CLINICAL IMPRESSION(S) / ED DIAGNOSES  Final diagnoses:  Deep vein thrombosis (DVT) of right lower extremity, unspecified chronicity, unspecified vein (HCC)      NEW MEDICATIONS STARTED DURING THIS VISIT:  New Prescriptions   No medications on file     Note:  This document was prepared using Dragon voice recognition software and may include unintentional dictation errors.    Nena Polio, MD 02/26/17 [phone]

## 2017-02-26 NOTE — Discharge Instructions (Signed)
Please follow-up with Dr. Delana Meyer or Dr. Lorenso Courier in the next week. Return or see them sooner if the leg gets worse. Keep leg up as much as possible. Continue the Plavix and the aspirin. We will plan on getting another ultrasound in the week to see if the clot has changed at all. Dr. Lorenso Courier is on-call today. She thinks since your wife is artery on the aspirin and Plavix and has fallen twice and since the clot looks like it's not fresh and is low the knee is okay not to put her on any more aggressive blood thinners as long as we have close follow-up.

## 2017-02-26 NOTE — ED Notes (Signed)
Pt taken to US via stretcher

## 2017-02-26 NOTE — ED Notes (Signed)
Pt husband answering all questions for pt. He states that since Saturday R leg has been more swollen. State now L is swollen "since being here." Pitting edema noted to R foot. Husband states pt has slurred speech and multiple stents in heart and legs. Husband thinks pt takes lasix.

## 2017-02-26 NOTE — ED Triage Notes (Signed)
Pt reports right leg and foot swelling x1 week, reports pain is located in right calf.

## 2017-03-05 ENCOUNTER — Encounter (INDEPENDENT_AMBULATORY_CARE_PROVIDER_SITE_OTHER): Payer: Self-pay | Admitting: Vascular Surgery

## 2017-03-05 ENCOUNTER — Ambulatory Visit (INDEPENDENT_AMBULATORY_CARE_PROVIDER_SITE_OTHER): Payer: BLUE CROSS/BLUE SHIELD | Admitting: Vascular Surgery

## 2017-03-05 VITALS — BP 128/82 | HR 75 | Resp 16 | Wt 148.0 lb

## 2017-03-05 DIAGNOSIS — I6523 Occlusion and stenosis of bilateral carotid arteries: Secondary | ICD-10-CM | POA: Diagnosis not present

## 2017-03-05 DIAGNOSIS — I25119 Atherosclerotic heart disease of native coronary artery with unspecified angina pectoris: Secondary | ICD-10-CM

## 2017-03-05 DIAGNOSIS — I82441 Acute embolism and thrombosis of right tibial vein: Secondary | ICD-10-CM | POA: Diagnosis not present

## 2017-03-05 DIAGNOSIS — I82409 Acute embolism and thrombosis of unspecified deep veins of unspecified lower extremity: Secondary | ICD-10-CM | POA: Insufficient documentation

## 2017-03-05 DIAGNOSIS — I10 Essential (primary) hypertension: Secondary | ICD-10-CM

## 2017-03-07 NOTE — Progress Notes (Signed)
MRN : [id number]  Kirsten Castro is a 56 y.o. (1960-10-16) female who presents with chief complaint of  Chief Complaint  Patient presents with  . Follow-up  .  History of Present Illness: The patient presents to the office for evaluation of DVT.  DVT was identified at [facility] by Duplex ultrasound and was found in the right peroneal vein.  The initial symptoms were pain and swelling in the lower extremity.  The patient notes the leg continues to be very painful with dependency and swells quite a bite.  Symptoms are much better with elevation.  The patient notes minimal edema in the morning which steadily worsens throughout the day.    The patient has not been using compression therapy at this point.  No SOB or pleuritic chest pains.  No cough or hemoptysis.   Current Meds  Medication Sig  . acetaminophen (TYLENOL) 500 MG tablet Take 1,000 mg by mouth every 6 (six) hours as needed for mild pain or headache.  Marland Kitchen aspirin EC 81 MG tablet Take by mouth.  Marland Kitchen atorvastatin (LIPITOR) 80 MG tablet Take 80 mg by mouth daily.  . cetirizine (ZYRTEC) 10 MG tablet Take 10 mg by mouth daily. Reported on 02/08/2016  . clopidogrel (PLAVIX) 75 MG tablet Take 75 mg by mouth daily.   . clotrimazole (LOTRIMIN) 1 % cream APP EXT AA BID  . cyanocobalamin 1000 MCG tablet Take 1 tablet (1,000 mcg total) by mouth daily.  . divalproex (DEPAKOTE) 500 MG DR tablet Take 1 tablet (500 mg total) by mouth every 12 (twelve) hours.  . Fluticasone-Salmeterol (ADVAIR DISKUS) 250-50 MCG/DOSE AEPB Inhale 1 puff into the lungs 2 (two) times daily.   . fluvoxaMINE (LUVOX) 100 MG tablet Take 2 tablets (200 mg total) by mouth at bedtime.  . furosemide (LASIX) 20 MG tablet Take 20 mg by mouth.  . haloperidol (HALDOL) 10 MG tablet Take 1 tablet (10 mg total) by mouth at bedtime.  . hydrocortisone (ANUSOL-HC) 2.5 % rectal cream Apply topically.  Marland Kitchen lisinopril (ZESTRIL) 10 MG tablet Take 1 tablet (10 mg total) by mouth daily.  .  Magnesium 250 MG TABS Take 250 mg by mouth 2 (two) times daily.  . mometasone-formoterol (DULERA) 200-5 MCG/ACT AERO Inhale 2 puffs into the lungs 2 (two) times daily.  . nitroGLYCERIN (NITROSTAT) 0.4 MG SL tablet Place 1 tablet (0.4 mg total) under the tongue every 5 (five) minutes as needed for chest pain.  . pantoprazole (PROTONIX) 40 MG tablet Take 1 tablet (40 mg total) by mouth 2 (two) times daily.  . polyethylene glycol powder (GLYCOLAX/MIRALAX) powder   . potassium chloride (MICRO-K) 10 MEQ CR capsule Take 10 mEq by mouth 2 (two) times daily.   Marland Kitchen senna-docusate (SENOKOT-S) 8.6-50 MG tablet Take 1 tablet by mouth at bedtime as needed for mild constipation.  . tamsulosin (FLOMAX) 0.4 MG CAPS capsule TAKE ONE CAPSULE BY MOUTH DAILY WITH BREAKFAST  . traZODone (DESYREL) 100 MG tablet Take 1 tablet (100 mg total) by mouth at bedtime.    Past Medical History:  Diagnosis Date  . Anemia   . Anginal pain ([place])   . Anxiety   . 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  This was done at [facility]  . BP (high blood pressure) 02/03/2016  . Cancer ([place])    melanoma skin cancer  . Carotid stenosis 02/10/2015  . Chronic kidney disease    UTI  . Colitis 12/15/2015  . Collagen vascular disease ([place])   . COPD (chronic obstructive pulmonary disease) ([place])   . Coronary artery disease   . Depression   . Esophageal candidiasis ([place])   . Gastritis   . GERD (gastroesophageal reflux disease)   . GI bleed 12/15/2015  . Headache   . Hypertension   . Ischemic colitis ([place]) 01/01/2016   Overview:   SMA mesenteric ischemia  now status post angioplasty by vascular of In stent restenosis.   Overview:  S?P SMA angioplasty   . Myocardial infarction ([place])   . Nausea and vomiting in adult   . Noninfectious diarrhea   . Peripheral vascular disease ([place]) 02/03/2016   Overview:  Follows with Dr. Delana Meyer    . Shortness of breath dyspnea    with exertion  . Stroke [facility])    TIA X 2  . Temporary cerebral vascular dysfunction 02/03/2016    Past Surgical History:  Procedure Laterality Date  . ABDOMINAL HYSTERECTOMY    . BACK SURGERY    . BREAST BIOPSY Left 6 2017   results not in yet  . COLONOSCOPY WITH PROPOFOL N/A 01/24/2016   Procedure: COLONOSCOPY WITH PROPOFOL;  Surgeon: Lucilla Lame, MD;  Location: [facility] ENDOSCOPY;  Service: Endoscopy;  Laterality: N/A;  . CORONARY ANGIOPLASTY    . CORONARY ARTERY BYPASS GRAFT    . ESOPHAGOGASTRODUODENOSCOPY (EGD) WITH PROPOFOL N/A 01/24/2016   Procedure: ESOPHAGOGASTRODUODENOSCOPY (EGD) WITH PROPOFOL;  Surgeon: Lucilla Lame, MD;  Location: [facility] ENDOSCOPY;  Service: Endoscopy;  Laterality: N/A;  . KYPHOPLASTY N/A 02/08/2016   Procedure: KYPHOPLASTY L1;  Surgeon: Hessie Knows, MD;  Location: [facility] ORS;  Service: Orthopedics;  Laterality: N/A;  . KYPHOPLASTY N/A 03/28/2016   Procedure: KYPHOPLASTY;  Surgeon: Hessie Knows, MD;  Location: [facility] ORS;  Service: Orthopedics;  Laterality: N/A;  . NOSE SURGERY     cancer removal  . OTHER SURGICAL HISTORY  Jul 11 2014   sternum removal  . PERIPHERAL VASCULAR CATHETERIZATION N/A 02/10/2015   Procedure: Carotid PTA/Stent Intervention;  Surgeon: Katha Cabal, MD;  Location: [place] CV LAB;  Service: Cardiovascular;  Laterality: N/A;  . PERIPHERAL VASCULAR CATHETERIZATION N/A 12/17/2015   Procedure: Visceral Angiography;  Surgeon: Katha Cabal, MD;  Location: [place] CV LAB;  Service: Cardiovascular;  Laterality: N/A;  . PERIPHERAL VASCULAR CATHETERIZATION N/A 12/17/2015   Procedure: Visceral Artery Intervention;  Surgeon: Katha Cabal, MD;  Location: [place] CV LAB;  Service: Cardiovascular;  Laterality: N/A;  . STENT PLACEMENT VASCULAR ([place] [facility])      Social History Social History  Substance Use Topics  . Smoking status: Former Smoker    Packs/day: 1.00    Types: Cigarettes    Quit date: 08/01/2012  . Smokeless tobacco: Never Used  . Alcohol use No    Family History Family History  Problem Relation Age of Onset  . Breast cancer Sister   . CAD Father   . CAD Brother     Allergies  Allergen Reactions  . Ferrous Sulfate Hives  . Sulfa Antibiotics      REVIEW OF SYSTEMS (Negative unless checked)  Constitutional: [] Weight loss  [] Fever  [] Chills Cardiac: [x] Chest pain   [] Chest pressure   [] Palpitations   [] Shortness of breath when laying flat   [x] Shortness of breath with exertion. Vascular:  [] Pain in legs with walking   [x] Pain in legs with standing  [] History of DVT   [] Phlebitis   [x] Swelling in legs   [] Varicose veins   [] Non-healing ulcers Pulmonary:   [] Uses home oxygen   [] Productive cough   [] Hemoptysis   [] Wheeze  [] COPD   [] Asthma Neurologic:  [] Dizziness   [] Seizures   [x] History of stroke   [] History of TIA  [] Aphasia   [] Vissual changes   [] Weakness or numbness in arm   [] Weakness or numbness in leg Musculoskeletal:   [] Joint swelling   [] Joint pain   [] Low back pain Hematologic:  [] Easy bruising  [] Easy bleeding   [] Hypercoagulable state   [] Anemic Gastrointestinal:  [] Diarrhea   [] Vomiting  [] Gastroesophageal reflux/heartburn   [] Difficulty swallowing. Genitourinary:  [] Chronic kidney disease   [] Difficult urination  [] Frequent urination   [] Blood in urine Skin:  [] Rashes   [] Ulcers  Psychological:  [] History of anxiety   []  History of major depression.  Physical Examination  Vitals:   03/05/17 1629  BP: 128/82  Pulse: 75  Resp: 16  Weight: 67.1 kg (148 lb)   Body mass index is 27.96 kg/m. Gen: WD/WN, NAD Head: [place]/AT, No temporalis wasting.  Ear/Nose/Throat:  Hearing grossly intact, nares w/o erythema or drainage Eyes: PER, EOMI, sclera nonicteric.  Neck: Supple, no large masses.   Pulmonary:  Good air movement, no audible wheezing bilaterally, no use of accessory muscles.  Cardiac: RRR, no JVD Vascular:  Bilateral carotid bruits; 2-3+ hard edema of the right leg with 1-2+ softer edema of the left Vessel Right Left  PT Palpable Palpable  DP Palpable Palpable  Gastrointestinal: Non-distended. No guarding/no peritoneal signs.  Musculoskeletal: M/S 5/5 throughout.  No deformity or atrophy.  Neurologic: CN 2-12 intact. Symmetrical.  Speech is fluent. Motor exam as listed above. Psychiatric: Judgment intact, Mood & affect appropriate for pt's clinical situation. Dermatologic: No rashes or ulcers noted.  No changes consistent with cellulitis. Lymph : No lichenification or skin changes of chronic lymphedema.  CBC Lab Results  Component Value Date   WBC 4.6 02/26/2017   HGB 13.6 02/26/2017   HCT 40.4 02/26/2017   MCV 101.8 (H) 02/26/2017   PLT 174 02/26/2017    BMET    Component Value Date/Time   NA 143 02/26/2017 1304   NA 137 12/30/2013 2351   K 3.8 02/26/2017 1304   K 3.1 (L) 12/30/2013 2351   CL 106 02/26/2017 1304   CL 105 12/30/2013 2351   CO2 30 02/26/2017 1304   CO2 25 12/30/2013 2351   GLUCOSE 151 (H) 02/26/2017 1304   GLUCOSE 146 (H) 12/30/2013 2351   BUN 24 (H) 02/26/2017 1304   BUN 13 02/17/2014 0758   CREATININE 0.91 02/26/2017 1304   CREATININE 1.02 02/17/2014 0758   CALCIUM 9.4 02/26/2017 1304   CALCIUM 9.1 12/30/2013 2351   GFRNONAA >60 02/26/2017 1304   GFRNONAA >60 02/17/2014 0758   GFRAA >60 02/26/2017 1304   GFRAA >60 02/17/2014 0758   Estimated Creatinine Clearance: 62.6 mL/min (by C-G formula based on SCr of 0.91 mg/dL).  COAG No results found for: INR, PROTIME  Radiology Ct Head Wo Contrast  Result Date: 02/26/2017 CLINICAL DATA:  Unwitnessed fall, loss of consciousness EXAM: CT HEAD WITHOUT  CONTRAST TECHNIQUE: Contiguous axial images were obtained  from the base of the skull through the vertex without intravenous contrast. COMPARISON:  10/19/2016 FINDINGS: Brain: No evidence of acute infarction, hemorrhage, hydrocephalus, extra-axial collection or mass lesion/mass effect Subcortical white matter and periventricular small vessel ischemic changes. Vascular: No hyperdense vessel or unexpected calcification. Skull: Normal. Negative for fracture or focal lesion. Sinuses/Orbits: The visualized paranasal sinuses are essentially clear. The mastoid air cells are unopacified. Other: None. IMPRESSION: No evidence of acute intracranial abnormality. Mild small vessel ischemic changes. Electronically Signed   By: Julian Hy M.D.   On: 02/26/2017 14:46   US Venous Img Lower Unilateral Right  Result Date: 02/26/2017 CLINICAL DATA:  Right calf pain and swelling for the past week. History of smoking and melanoma. Evaluate for DVT. EXAM: RIGHT LOWER EXTREMITY VENOUS DOPPLER ULTRASOUND TECHNIQUE: Gray-scale sonography with graded compression, as well as color Doppler and duplex ultrasound were performed to evaluate the lower extremity deep venous systems from the level of the common femoral vein and including the common femoral, femoral, profunda femoral, popliteal and calf veins including the posterior tibial, peroneal and gastrocnemius veins when visible. The superficial great saphenous vein was also interrogated. Spectral Doppler was utilized to evaluate flow at rest and with distal augmentation maneuvers in the common femoral, femoral and popliteal veins. COMPARISON:  None. FINDINGS: Contralateral Common Femoral Vein: Respiratory phasicity is normal and symmetric with the symptomatic side. No evidence of thrombus. Normal compressibility. Common Femoral Vein: No evidence of thrombus. Normal compressibility, respiratory phasicity and response to augmentation. Saphenofemoral Junction: No evidence of thrombus.  Normal compressibility and flow on color Doppler imaging. Profunda Femoral Vein: No evidence of thrombus. Normal compressibility and flow on color Doppler imaging. Femoral Vein: No evidence of thrombus. Normal compressibility, respiratory phasicity and response to augmentation. Popliteal Vein: No evidence of thrombus. Normal compressibility, respiratory phasicity and response to augmentation. Calf Veins: There is hypoechoic occlusive thrombus within 1 of the paired right peroneal veins (representative images 41-43). The posterior tibial veins appear patent where imaged. Superficial Great Saphenous Vein: No evidence of thrombus. Normal compressibility and flow on color Doppler imaging. Venous Reflux:  None. Other Findings:  None. IMPRESSION: The examination is positive for age-indeterminate occlusive DVT within 1 of the paired right peroneal veins. There is no extension of this distal calf DVT to the more proximal deep venous system of the right lower extremity. Electronically Signed   By: Sandi Mariscal M.D.   On: 02/26/2017 14:05     Assessment/Plan 1. Acute deep vein thrombosis (DVT) of tibial vein of right lower extremity (HCC) Recommend:   No surgery or intervention at this point in time.  IVC filter is not indicated at present.  Patient's duplex ultrasound of the venous system shows DVT in the peroneal veins  The patient is not on anticoagulation   Elevation was stressed, use of a recliner was discussed.  I have had a long discussion with the patient regarding DVT and post phlebitic changes such as swelling and why it  causes symptoms such as pain.  The patient will wear graduated compression stockings class 1 (20-30 mmHg), beginning after three full days of anticoagulation, on a daily basis a prescription was given. The patient will  beginning wearing the stockings first thing in the morning and removing them in the evening. The patient is instructed specifically not to sleep in the stockings.   In addition, behavioral modification including elevation during the day and avoidance of prolonged dependency will be initiated.    The patient will have  a follow up duplex to ensure that there has not been any propagation.   - VAS [place] LOWER EXTREMITY VENOUS (DVT); Future  2. Bilateral carotid artery stenosis Recommend:  Given the patient's asymptomatic subcritical stenosis no further invasive testing or surgery at this time.  Continue antiplatelet therapy as prescribed Continue management of CAD, HTN and Hyperlipidemia Healthy heart diet,  encouraged exercise at least 4 times per week Follow up in 6 months with duplex ultrasound and physical exam based   3. Coronary artery disease involving native coronary artery of native heart with angina pectoris ([place]) Continue cardiac and antihypertensive medications as already ordered and reviewed, no changes at this time.  Continue statin as ordered and reviewed, no changes at this time  Nitrates PRN for chest pain   4. Essential hypertension Continue antihypertensive medications as already ordered, these medications have been reviewed and there are no changes at this time.     Hortencia Pilar, MD  03/07/2017 12:15 PM

## 2017-03-20 ENCOUNTER — Encounter: Payer: Self-pay | Admitting: Vascular Surgery

## 2017-03-20 ENCOUNTER — Emergency Department: Payer: BLUE CROSS/BLUE SHIELD

## 2017-03-20 ENCOUNTER — Emergency Department
Admission: EM | Admit: 2017-03-20 | Discharge: 2017-03-20 | Disposition: A | Discharge status: Home / Self Care | Payer: BLUE CROSS/BLUE SHIELD | Attending: Emergency Medicine | Admitting: Emergency Medicine

## 2017-03-20 ENCOUNTER — Telehealth (INDEPENDENT_AMBULATORY_CARE_PROVIDER_SITE_OTHER): Payer: Self-pay

## 2017-03-20 DIAGNOSIS — N189 Chronic kidney disease, unspecified: Secondary | ICD-10-CM | POA: Diagnosis not present

## 2017-03-20 DIAGNOSIS — Y999 Unspecified external cause status: Secondary | ICD-10-CM | POA: Insufficient documentation

## 2017-03-20 DIAGNOSIS — Z85828 Personal history of other malignant neoplasm of skin: Secondary | ICD-10-CM | POA: Diagnosis not present

## 2017-03-20 DIAGNOSIS — S0990XA Unspecified injury of head, initial encounter: Secondary | ICD-10-CM | POA: Diagnosis present

## 2017-03-20 DIAGNOSIS — Y9389 Activity, other specified: Secondary | ICD-10-CM | POA: Insufficient documentation

## 2017-03-20 DIAGNOSIS — I129 Hypertensive chronic kidney disease with stage 1 through stage 4 chronic kidney disease, or unspecified chronic kidney disease: Secondary | ICD-10-CM | POA: Diagnosis not present

## 2017-03-20 DIAGNOSIS — J449 Chronic obstructive pulmonary disease, unspecified: Secondary | ICD-10-CM | POA: Diagnosis not present

## 2017-03-20 DIAGNOSIS — Z8673 Personal history of transient ischemic attack (TIA), and cerebral infarction without residual deficits: Secondary | ICD-10-CM | POA: Diagnosis not present

## 2017-03-20 DIAGNOSIS — Z951 Presence of aortocoronary bypass graft: Secondary | ICD-10-CM | POA: Diagnosis not present

## 2017-03-20 DIAGNOSIS — W19XXXA Unspecified fall, initial encounter: Secondary | ICD-10-CM | POA: Insufficient documentation

## 2017-03-20 DIAGNOSIS — Z87891 Personal history of nicotine dependence: Secondary | ICD-10-CM | POA: Diagnosis not present

## 2017-03-20 DIAGNOSIS — Y92002 Bathroom of unspecified non-institutional (private) residence single-family (private) house as the place of occurrence of the external cause: Secondary | ICD-10-CM | POA: Insufficient documentation

## 2017-03-20 DIAGNOSIS — I2510 Atherosclerotic heart disease of native coronary artery without angina pectoris: Secondary | ICD-10-CM | POA: Insufficient documentation

## 2017-03-20 DIAGNOSIS — M545 Low back pain: Secondary | ICD-10-CM | POA: Diagnosis not present

## 2017-03-20 DIAGNOSIS — S20229A Contusion of unspecified back wall of thorax, initial encounter: Secondary | ICD-10-CM

## 2017-03-20 MED ORDER — TRAMADOL HCL 50 MG PO TABS: 50.0000 mg | ORAL_TABLET | Freq: Four times a day (QID) | ORAL | 0 refills | Status: AC | PRN

## 2017-03-20 MED ORDER — OXYCODONE-ACETAMINOPHEN 5-325 MG PO TABS
2.0000 | ORAL_TABLET | Freq: Once | ORAL | Status: AC
  Administered 2017-03-20: 2 via ORAL
  Filled 2017-03-20: qty 2

## 2017-03-20 NOTE — Telephone Encounter (Signed)
Gabriel Cirri from [facility] ([phone]) called asking if the patient should get another dvt u/s since she got one on 02/26/17.She stated GS put an order In on 03/05/17 and she just wanted make sure that what he wanted before calling the patient to schedule the appointment.

## 2017-03-20 NOTE — ED Provider Notes (Signed)
[facility] Emergency Department Provider Note       Time seen: ----------------------------------------- 6:39 PM on 03/20/2017 -----------------------------------------     I have reviewed the triage vital signs and the nursing notes.   HISTORY   Chief Complaint Fall    HPI Kirsten Castro is a 56 y.o. female who presents to the ED for a fall. Patient fell approximately 5 hours ago when she was standing up from the toilet, lost her balance and fell backwards into the shower. She hit her head and back. She denies any loss of consciousness but she reports feeling "fuzzy" afterwards. Patient does have tenderness throughout her spine noted by EMS. She states pain in her back is worse when she moves her arms. She is able to get up on her own and able to walk around after the fall.   Past Medical History:  Diagnosis Date  . Anemia   . Anginal pain ([place])   . Anxiety   . 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  This was done at [facility]  . BP (high blood pressure) 02/03/2016  . Cancer ([place])    melanoma skin cancer  . Carotid stenosis 02/10/2015  . Chronic kidney disease    UTI  . Colitis 12/15/2015  . Collagen vascular disease ([place])   . COPD (chronic obstructive pulmonary disease) ([place])   . Coronary artery disease   . Depression   . Esophageal candidiasis ([place])   . Gastritis   . GERD (gastroesophageal reflux disease)   . GI bleed 12/15/2015  . Headache   . Hypertension   . Ischemic colitis ([place]) 01/01/2016   Overview:   SMA mesenteric ischemia now status post  angioplasty by vascular of In stent restenosis.   Overview:  S?P SMA angioplasty   . Myocardial infarction ([place])   . Nausea and vomiting in adult   . Noninfectious diarrhea   . Peripheral vascular disease ([place]) 02/03/2016   Overview:  Follows with Dr. Delana Meyer    . Shortness of breath dyspnea    with exertion  . Stroke [facility])    TIA X 2  . Temporary cerebral vascular dysfunction 02/03/2016    Patient Active Problem List   Diagnosis Date Noted  . DVT (deep venous thrombosis) ([place]) 03/05/2017  . Severe recurrent major depression with psychotic features ([place]) 10/02/2016  . Benzodiazepine withdrawal ([place]) 10/02/2016  . Trichotillomania 10/02/2016  . Abdominal pain 04/19/2016  . Sepsis ([place]) 03/09/2016  . Urinary retention 03/09/2016  . CAD (coronary artery disease) 03/09/2016  . Frank hematuria 03/08/2016  . Temporary cerebral vascular dysfunction 02/03/2016  . Peripheral vascular disease ([place]) 02/03/2016  . HTN (hypertension) 02/03/2016  . HLD (hyperlipidemia) 02/03/2016  . Clinical depression 02/03/2016  . Carotid artery narrowing 02/03/2016  . 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 Malignant neoplastic disease ([place]) 02/03/2016  . Anxiety 02/03/2016  . Abdominal pain, generalized   . Nausea with vomiting   . Gastritis   . Esophageal candidiasis ([place])   . Noninfectious diarrhea   . Intractable pain 01/21/2016  .  Upper abdominal pain   . Nausea and vomiting in adult   . Ischemic colitis ([place]) 01/01/2016  . Colitis 12/15/2015  . GI bleed 12/15/2015  . Acute inflammation of the pancreas 12/06/2015  . Vitamin B12 deficiency anemia 02/25/2015  . Absolute anemia 02/25/2015  . Carotid stenosis 02/10/2015  . Personal history of other specified conditions 11/27/2014  . Osteomyelitis ([place]) 11/27/2014  . H/O coronary artery bypass surgery 05/18/2014    Past Surgical History:  Procedure Laterality Date  . ABDOMINAL HYSTERECTOMY    . BACK SURGERY    .  BREAST BIOPSY Left 6 2017   results not in yet  . COLONOSCOPY WITH PROPOFOL N/A 01/24/2016   Procedure: COLONOSCOPY WITH PROPOFOL;  Surgeon: Lucilla Lame, MD;  Location: [facility] ENDOSCOPY;  Service: Endoscopy;  Laterality: N/A;  . CORONARY ANGIOPLASTY    . CORONARY ARTERY BYPASS GRAFT    . ESOPHAGOGASTRODUODENOSCOPY (EGD) WITH PROPOFOL N/A 01/24/2016   Procedure: ESOPHAGOGASTRODUODENOSCOPY (EGD) WITH PROPOFOL;  Surgeon: Lucilla Lame, MD;  Location: [facility] ENDOSCOPY;  Service: Endoscopy;  Laterality: N/A;  . KYPHOPLASTY N/A 02/08/2016   Procedure: KYPHOPLASTY L1;  Surgeon: Hessie Knows, MD;  Location: [facility] ORS;  Service: Orthopedics;  Laterality: N/A;  . KYPHOPLASTY N/A 03/28/2016   Procedure: KYPHOPLASTY;  Surgeon: Hessie Knows, MD;  Location: [facility] ORS;  Service: Orthopedics;  Laterality: N/A;  . NOSE SURGERY     cancer removal  . OTHER SURGICAL HISTORY  Jul 11 2014   sternum removal  . PERIPHERAL VASCULAR CATHETERIZATION N/A 02/10/2015   Procedure: Carotid PTA/Stent Intervention;  Surgeon: Katha Cabal, MD;  Location: [place] CV LAB;  Service: Cardiovascular;  Laterality: N/A;  . PERIPHERAL VASCULAR CATHETERIZATION N/A 12/17/2015   Procedure: Visceral Angiography;  Surgeon: Katha Cabal, MD;  Location: [place] CV LAB;  Service: Cardiovascular;  Laterality: N/A;  . PERIPHERAL VASCULAR CATHETERIZATION N/A 12/17/2015   Procedure: Visceral Artery Intervention;  Surgeon: Katha Cabal, MD;  Location: [place] CV LAB;  Service: Cardiovascular;  Laterality: N/A;  . STENT PLACEMENT VASCULAR ([facility] HX)      Allergies Ferrous sulfate and Sulfa antibiotics  Social History Social History  Substance Use Topics  . Smoking status: Former Smoker    Packs/day: 1.00    Types: Cigarettes    Quit date: 08/01/2012  . Smokeless tobacco: Never Used  . Alcohol use No    Review of Systems Constitutional: Negative for fever. Cardiovascular: Negative for chest pain. Respiratory:  Negative for shortness of breath. Gastrointestinal: Negative for abdominal pain, vomiting and diarrhea. Genitourinary: Negative for dysuria. Musculoskeletal: Positive for head, neck and back pain Skin: Negative for rash. Neurological: Positive for headache  All systems negative/normal/unremarkable except as stated in the HPI  ____________________________________________   PHYSICAL EXAM:  VITAL SIGNS: ED Triage Vitals  Enc Vitals Group     BP 03/20/17 1830 120/73     Pulse Rate 03/20/17 1830 71     Resp 03/20/17 1830 16     Temp 03/20/17 1838 98.5 F (36.9 C)     Temp Source 03/20/17 1838 Oral     SpO2 03/20/17 1830 94 %     Weight --      Height --      Head Circumference --      Peak Flow --      Pain Score 03/20/17 1833 10     Pain Loc --      Pain Edu? --      Excl. in [place]? --  Constitutional: Alert, Well appearing and in no distress. Eyes: Conjunctivae are normal. Normal extraocular movements. ENT   Head: Normocephalic, Occipital scalp tenderness   Nose: No congestion/rhinnorhea.   Mouth/Throat: Mucous membranes are moist.   Neck: No stridor. Cardiovascular: Normal rate, regular rhythm. No murmurs, rubs, or gallops. Respiratory: Normal respiratory effort without tachypnea nor retractions. Breath sounds are clear and equal bilaterally. No wheezes/rales/rhonchi. Gastrointestinal: Soft and nontender. Normal bowel sounds Musculoskeletal: Nontender with normal range of motion in extremities. No lower extremity tenderness nor edema. Severe kyphosis is noted. Tenderness throughout her cervical and thoracic spine Neurologic:  Normal speech and language. No gross focal neurologic deficits are appreciated. Adequate strength and sensation in the upper and lower extremities Skin:  Skin is warm, dry and intact. No rash noted. Psychiatric: Mood and affect are normal.   ____________________________________________  ED COURSE:  Pertinent labs & imaging results  that were available during my care of the patient were reviewed by me and considered in my medical decision making (see chart for details). Patient presents for a fall, we will assess with labs and imaging as indicated.   Procedures ____________________________________________   LABS (pertinent positives/negatives)  Labs Reviewed - No data to display  RADIOLOGY Images were viewed by me  CT head, C-spine, thoracic spine x-rays IMPRESSION: No acute findings from T2 inferiorly. cervicothoracic junction not well evaluated on the lateral view.  T12 compression deformity and vertebral augmentation, as before. IMPRESSION: Atrophy with small vessel chronic ischemic changes of deep cerebral white matter.  Probable small old RIGHT occipital infarct.  No acute intracranial abnormalities.  No definite acute cervical spine abnormalities identified on exam with scattered motion artifacts as above. ____________________________________________  FINAL ASSESSMENT AND PLAN  Fall, back pain  Plan: Patient's labs and imaging were dictated above. Patient had presented for a mechanical fall without any obvious injuries. She has an old T12 compression deformity but no new injuries are identified. Her strength and sensation remains normal in upper and lower extremities. She'll be given pain medicine and she is stable for outpatient follow-up with her doctor.   Earleen Newport, MD   Note: This note was generated in part or whole with voice recognition software. Voice recognition is usually quite accurate but there are transcription errors that can and very often do occur. I apologize for any typographical errors that were not detected and corrected.     Earleen Newport, MD 03/20/17 2016

## 2017-03-20 NOTE — ED Triage Notes (Signed)
Pt reports to the ED via [facility] following a fall. Approx 5 hours ago she was standing up from the toilet and lost her balance and fell backwards into her shower. She hit her head and back. She denies LOC but reports she felt fuzzy afterwards. She does have tenderness down her spine per EMS. No c-collar or LSB upon arrival to ED. She states that her pain is worse when she moves her arms and legs. Denies any N/V. She was able to get up on her own and was able to walk around after the fall. She is on Plavix. She states she is unable to move her head. VSS en route.

## 2017-03-22 ENCOUNTER — Telehealth (INDEPENDENT_AMBULATORY_CARE_PROVIDER_SITE_OTHER): Payer: Self-pay

## 2017-03-22 NOTE — Telephone Encounter (Signed)
I spoke with Sabrina from [place] and inform her that [facility] wanted the ultrasound done to check to see if the dvt have increase.

## 2017-06-11 ENCOUNTER — Ambulatory Visit (INDEPENDENT_AMBULATORY_CARE_PROVIDER_SITE_OTHER): Payer: BLUE CROSS/BLUE SHIELD | Admitting: Vascular Surgery

## 2017-06-11 ENCOUNTER — Encounter (INDEPENDENT_AMBULATORY_CARE_PROVIDER_SITE_OTHER): Payer: Self-pay | Admitting: Vascular Surgery

## 2017-06-11 VITALS — BP 135/81 | HR 63 | Resp 16 | Wt 125.0 lb

## 2017-06-11 DIAGNOSIS — I10 Essential (primary) hypertension: Secondary | ICD-10-CM

## 2017-06-11 DIAGNOSIS — I25119 Atherosclerotic heart disease of native coronary artery with unspecified angina pectoris: Secondary | ICD-10-CM

## 2017-06-11 DIAGNOSIS — I82441 Acute embolism and thrombosis of right tibial vein: Secondary | ICD-10-CM | POA: Diagnosis not present

## 2017-06-11 DIAGNOSIS — I6523 Occlusion and stenosis of bilateral carotid arteries: Secondary | ICD-10-CM

## 2017-06-11 DIAGNOSIS — E782 Mixed hyperlipidemia: Secondary | ICD-10-CM

## 2017-06-15 NOTE — Progress Notes (Signed)
MRN : [id number]  Kirsten Castro is a 56 y.o. (07-14-1961) female who presents with chief complaint of  Chief Complaint  Patient presents with  . Follow-up    67month  .  History of Present Illness:  The patient presents to the office for evaluation of DVT.  DVT was identified at [facility] by Duplex ultrasound.  The initial symptoms were pain and swelling in the right lower extremity.  The patient notes the leg is no longer particularly painful and doesn't swells much.  Symptoms are much better with elevation.  The patient notes minimal edema in the morning which steadily worsens throughout the day.    The patient has not been using compression therapy at this point.  No SOB or pleuritic chest pains.  No cough or hemoptysis.  No blood per rectum or blood in any sputum.  No excessive bruising per the patient.   The carotid stenosis is followed by ultrasound.   The patient denies amaurosis fugax. There is no recent history of TIA symptoms or focal motor deficits. There is no prior documented CVA.  The patient is taking enteric-coated aspirin 81 mg daily.  There is no history of migraine headaches. There is no history of seizures.  The patient has a history of coronary artery disease, no recent episodes of angina or shortness of breath. The patient denies PAD or claudication symptoms. There is a history of hyperlipidemia which is being treated with a statin.      Current Meds  Medication Sig  . acetaminophen (TYLENOL) 500 MG tablet Take 1,000 mg by mouth every 6 (six) hours as needed for mild pain or headache.  . ALPRAZolam (XANAX) 1 MG tablet TAKE 1 TABLET BY MOUTH THREE TIMES DAILY AS NEEDED  . aspirin EC 81 MG tablet Take by mouth.  . carvedilol (COREG) 12.5 MG tablet TAKE 1 TABLET BY MOUTH TWICE DAILY WITH MEALS  . clopidogrel (PLAVIX) 75 MG tablet Take 75 mg by mouth daily.   . cyanocobalamin 1000 MCG tablet Take 1 tablet (1,000 mcg total) by mouth daily.  . divalproex  (DEPAKOTE) 500 MG DR tablet Take 1 tablet (500 mg total) by mouth every 12 (twelve) hours.  . fluvoxaMINE (LUVOX) 100 MG tablet Take 2 tablets (200 mg total) by mouth at bedtime.  . haloperidol (HALDOL) 10 MG tablet Take 1 tablet (10 mg total) by mouth at bedtime.  Marland Kitchen lisinopril (ZESTRIL) 10 MG tablet Take 1 tablet (10 mg total) by mouth daily.  . Magnesium 250 MG TABS Take 250 mg by mouth 2 (two) times daily.  . nitroGLYCERIN (NITROSTAT) 0.4 MG SL tablet Place 1 tablet (0.4 mg total) under the tongue every 5 (five) minutes as needed for chest pain.  . potassium chloride (MICRO-K) 10 MEQ CR capsule Take 10 mEq by mouth 2 (two) times daily.   . traMADol (ULTRAM) 50 MG tablet Take 1 tablet (50 mg total) by mouth every 6 (six) hours as needed.  . traZODone (DESYREL) 100 MG tablet Take 1 tablet (100 mg total) by mouth at bedtime.    Past Medical History:  Diagnosis Date  . Anemia   . Anginal pain ([place])   . Anxiety   . 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  This was done at [facility]  . BP (high blood pressure) 02/03/2016  . Cancer ([address])    melanoma skin cancer  . Carotid stenosis 02/10/2015  . Chronic kidney disease    UTI  . Colitis 12/15/2015  . Collagen vascular disease ([place])   . COPD (chronic obstructive pulmonary disease) ([place])   . Coronary artery disease   . Depression   . Esophageal candidiasis ([place])   . Gastritis   . GERD (gastroesophageal reflux disease)   . GI bleed 12/15/2015  . Headache   . Hypertension   . Ischemic colitis ([place]) 01/01/2016   Overview:   SMA mesenteric ischemia now status  post angioplasty by vascular of In stent restenosis.   Overview:  S?P SMA angioplasty   . Myocardial infarction ([place])   . Nausea and vomiting in adult   . Noninfectious diarrhea   . Peripheral vascular disease ([place]) 02/03/2016   Overview:  Follows with Dr. Delana Meyer    . Shortness of breath dyspnea    with exertion  . Stroke [facility])    TIA X 2  . Temporary cerebral vascular dysfunction 02/03/2016    Past Surgical History:  Procedure Laterality Date  . ABDOMINAL HYSTERECTOMY    . BACK SURGERY    . BREAST BIOPSY Left 6 2017   results not in yet  . COLONOSCOPY WITH PROPOFOL N/A 01/24/2016   Procedure: COLONOSCOPY WITH PROPOFOL;  Surgeon: Lucilla Lame, MD;  Location: [facility] ENDOSCOPY;  Service: Endoscopy;  Laterality: N/A;  . CORONARY ANGIOPLASTY    . CORONARY ARTERY BYPASS GRAFT    . ESOPHAGOGASTRODUODENOSCOPY (EGD) WITH PROPOFOL N/A 01/24/2016   Procedure: ESOPHAGOGASTRODUODENOSCOPY (EGD) WITH PROPOFOL;  Surgeon: Lucilla Lame, MD;  Location: [facility] ENDOSCOPY;  Service: Endoscopy;  Laterality: N/A;  . KYPHOPLASTY N/A 02/08/2016   Procedure: KYPHOPLASTY L1;  Surgeon: Hessie Knows, MD;  Location: [facility] ORS;  Service: Orthopedics;  Laterality: N/A;  . KYPHOPLASTY N/A 03/28/2016   Procedure: KYPHOPLASTY;  Surgeon: Hessie Knows, MD;  Location: [facility] ORS;  Service: Orthopedics;  Laterality: N/A;  . NOSE SURGERY     cancer removal  . OTHER SURGICAL HISTORY  Jul 11 2014   sternum removal  . PERIPHERAL VASCULAR CATHETERIZATION N/A 02/10/2015   Procedure: Carotid PTA/Stent Intervention;  Surgeon: Katha Cabal, MD;  Location: [place] CV LAB;  Service: Cardiovascular;  Laterality: N/A;  . PERIPHERAL VASCULAR CATHETERIZATION N/A 12/17/2015   Procedure: Visceral Angiography;  Surgeon: Katha Cabal, MD;  Location: [place] CV LAB;  Service: Cardiovascular;  Laterality: N/A;  . PERIPHERAL VASCULAR CATHETERIZATION N/A 12/17/2015   Procedure: Visceral Artery Intervention;  Surgeon: Katha Cabal,  MD;  Location: [place] CV LAB;  Service: Cardiovascular;  Laterality: N/A;  . STENT PLACEMENT VASCULAR ([facility])      Social History Social History  Substance Use Topics  . Smoking status: Former Smoker    Packs/day: 1.00    Types: Cigarettes    Quit date: 08/01/2012  . Smokeless tobacco: Never Used  . Alcohol use No    Family History Family History  Problem Relation Age of Onset  . Breast cancer Sister   . CAD Father   . CAD Brother     Allergies  Allergen Reactions  . Ferrous Sulfate Hives  . Sulfa Antibiotics      REVIEW  OF SYSTEMS (Negative unless checked)  Constitutional: [] Weight loss  [] Fever  [] Chills Cardiac: [x] Chest pain   [] Chest pressure   [] Palpitations   [] Shortness of breath when laying flat   [x] Shortness of breath with exertion. Vascular:  [x] Pain in legs with walking   [] Pain in legs at rest  [] History of DVT   [] Phlebitis   [x] Swelling in legs   [] Varicose veins   [] Non-healing ulcers Pulmonary:   [] Uses home oxygen   [] Productive cough   [] Hemoptysis   [] Wheeze  [] COPD   [] Asthma Neurologic:  [] Dizziness   [] Seizures   [] History of stroke   [] History of TIA  [] Aphasia   [] Vissual changes   [] Weakness or numbness in arm   [] Weakness or numbness in leg Musculoskeletal:   [] Joint swelling   [] Joint pain   [] Low back pain Hematologic:  [] Easy bruising  [] Easy bleeding   [] Hypercoagulable state   [] Anemic Gastrointestinal:  [] Diarrhea   [] Vomiting  [] Gastroesophageal reflux/heartburn   [] Difficulty swallowing. Genitourinary:  [] Chronic kidney disease   [] Difficult urination  [] Frequent urination   [] Blood in urine Skin:  [] Rashes   [] Ulcers  Psychological:  [] History of anxiety   []  History of major depression.  Physical Examination  Vitals:   06/11/17 1122  BP: 135/81  Pulse: 63  Resp: 16  Weight: 125 lb (56.7 kg)   Body mass index is 23.62 kg/m. Gen: WD/ cachetic severely debilitated Head: /AT, No temporalis wasting.  Ear/Nose/Throat:  Hearing grossly intact, nares w/o erythema or drainage Eyes: PER, EOMI, sclera nonicteric.  Neck: Supple, no large masses.   Pulmonary:  Good air movement, no audible wheezing bilaterally, no use of accessory muscles.  Cardiac: RRR, no JVD Vascular:  Patient has bilateral carotid bruits Vessel Right Left  Radial Palpable Palpable  Ulnar Palpable Palpable  Brachial Palpable Palpable  Carotid Palpable Palpable  Femoral Palpable Palpable  Popliteal Not Palpable Not Palpable  PT Not Palpable Not Palpable  DP Not Palpable Not Palpable  Gastrointestinal: Non-distended. No guarding/no peritoneal signs.  Musculoskeletal: M/S 5/5 throughout.  neck deformity or  diffuse atrophy.  Neurologic: CN 2-12 intact. Symmetrical.  Speech is fluent. Motor exam as listed above. Psychiatric: Judgment intact, Mood & affect appropriate for pt's clinical situation. Dermatologic: No rashes or ulcers noted.  No changes consistent with cellulitis. Lymph : No lichenification or skin changes of chronic lymphedema.  CBC Lab Results  Component Value Date   WBC 4.6 02/26/2017   HGB 13.6 02/26/2017   HCT 40.4 02/26/2017   MCV 101.8 (H) 02/26/2017   PLT 174 02/26/2017    BMET    Component Value Date/Time   NA 143 02/26/2017 1304   NA 137 12/30/2013 2351   K 3.8 02/26/2017 1304   K 3.1 (L) 12/30/2013 2351   CL 106 02/26/2017 1304   CL 105 12/30/2013 2351   CO2 30 02/26/2017 1304   CO2 25 12/30/2013 2351   GLUCOSE 151 (H) 02/26/2017 1304   GLUCOSE 146 (H) 12/30/2013 2351   BUN 24 (H) 02/26/2017 1304   BUN 13 02/17/2014 0758   CREATININE 0.91 02/26/2017 1304   CREATININE 1.02 02/17/2014 0758   CALCIUM 9.4 02/26/2017 1304   CALCIUM 9.1 12/30/2013 2351   GFRNONAA >60 02/26/2017 1304   GFRNONAA >60 02/17/2014 0758   GFRAA >60 02/26/2017 1304   GFRAA >60 02/17/2014 0758   CrCl cannot be calculated (Patient's most recent lab result is older than the maximum 21 days allowed.).  COAG No results found  for:  INR, PROTIME  Radiology No results found.  Assessment/Plan 1. Acute deep vein thrombosis (DVT) of tibial vein of right lower extremity (HCC) Recommend:   No surgery or intervention at this point in time.  IVC filter is not indicated at present.  The patient is initiated on anticoagulation   Elevation was stressed, use of a recliner was discussed.  I have had a long discussion with the patient regarding DVT and post phlebitic changes such as swelling and why it  causes symptoms such as pain.  The patient will wear graduated compression stockings class 1 (20-30 mmHg), beginning after three full days of anticoagulation, on a daily basis a prescription was given. The patient will  beginning wearing the stockings first thing in the morning and removing them in the evening. The patient is instructed specifically not to sleep in the stockings.  In addition, behavioral modification including elevation during the day and avoidance of prolonged dependency will be initiated.    The patient will continue anticoagulation for now as there have not been any problems or complications at this point.    2. Bilateral carotid artery stenosis The patient is overdo for her carotid ultrasound  - VAS US CAROTID; Future  3. Essential hypertension Continue antihypertensive medications as already ordered, these medications have been reviewed and there are no changes at this time.   4. Coronary artery disease involving native coronary artery of native heart with angina pectoris ([place]) Continue cardiac and antihypertensive medications as already ordered and reviewed, no changes at this time.  Continue statin as ordered and reviewed, no changes at this time  Nitrates PRN for chest pain   5. Mixed hyperlipidemia Continue statin as ordered and reviewed, no changes at this time     Hortencia Pilar, MD  06/15/2017 4:13 PM

## 2017-06-20 ENCOUNTER — Encounter (INDEPENDENT_AMBULATORY_CARE_PROVIDER_SITE_OTHER): Payer: BLUE CROSS/BLUE SHIELD

## 2017-08-05 ENCOUNTER — Inpatient Hospital Stay
Admission: EM | Admit: 2017-08-05 | Discharge: 2017-08-07 | DRG: 287 | Disposition: A | Discharge status: Home / Self Care | Payer: BLUE CROSS/BLUE SHIELD | Attending: Internal Medicine | Admitting: Internal Medicine

## 2017-08-05 ENCOUNTER — Other Ambulatory Visit: Payer: Self-pay

## 2017-08-05 ENCOUNTER — Emergency Department: Payer: BLUE CROSS/BLUE SHIELD

## 2017-08-05 ENCOUNTER — Encounter: Payer: Self-pay | Admitting: Emergency Medicine

## 2017-08-05 DIAGNOSIS — K219 Gastro-esophageal reflux disease without esophagitis: Secondary | ICD-10-CM | POA: Diagnosis present

## 2017-08-05 DIAGNOSIS — Z7902 Long term (current) use of antithrombotics/antiplatelets: Secondary | ICD-10-CM | POA: Diagnosis not present

## 2017-08-05 DIAGNOSIS — Z85820 Personal history of malignant melanoma of skin: Secondary | ICD-10-CM

## 2017-08-05 DIAGNOSIS — J449 Chronic obstructive pulmonary disease, unspecified: Secondary | ICD-10-CM | POA: Diagnosis present

## 2017-08-05 DIAGNOSIS — Z955 Presence of coronary angioplasty implant and graft: Secondary | ICD-10-CM | POA: Diagnosis not present

## 2017-08-05 DIAGNOSIS — Z7982 Long term (current) use of aspirin: Secondary | ICD-10-CM

## 2017-08-05 DIAGNOSIS — R748 Abnormal levels of other serum enzymes: Secondary | ICD-10-CM | POA: Diagnosis present

## 2017-08-05 DIAGNOSIS — Z8673 Personal history of transient ischemic attack (TIA), and cerebral infarction without residual deficits: Secondary | ICD-10-CM | POA: Diagnosis not present

## 2017-08-05 DIAGNOSIS — I130 Hypertensive heart and chronic kidney disease with heart failure and stage 1 through stage 4 chronic kidney disease, or unspecified chronic kidney disease: Secondary | ICD-10-CM | POA: Diagnosis present

## 2017-08-05 DIAGNOSIS — I252 Old myocardial infarction: Secondary | ICD-10-CM | POA: Diagnosis not present

## 2017-08-05 DIAGNOSIS — Z79899 Other long term (current) drug therapy: Secondary | ICD-10-CM | POA: Diagnosis not present

## 2017-08-05 DIAGNOSIS — I5020 Unspecified systolic (congestive) heart failure: Secondary | ICD-10-CM | POA: Diagnosis present

## 2017-08-05 DIAGNOSIS — N183 Chronic kidney disease, stage 3 (moderate): Secondary | ICD-10-CM | POA: Diagnosis present

## 2017-08-05 DIAGNOSIS — Z951 Presence of aortocoronary bypass graft: Secondary | ICD-10-CM

## 2017-08-05 DIAGNOSIS — I25110 Atherosclerotic heart disease of native coronary artery with unstable angina pectoris: Principal | ICD-10-CM | POA: Diagnosis present

## 2017-08-05 DIAGNOSIS — M869 Osteomyelitis, unspecified: Secondary | ICD-10-CM | POA: Diagnosis present

## 2017-08-05 DIAGNOSIS — E785 Hyperlipidemia, unspecified: Secondary | ICD-10-CM | POA: Diagnosis present

## 2017-08-05 DIAGNOSIS — E876 Hypokalemia: Secondary | ICD-10-CM

## 2017-08-05 DIAGNOSIS — R778 Other specified abnormalities of plasma proteins: Secondary | ICD-10-CM

## 2017-08-05 DIAGNOSIS — F329 Major depressive disorder, single episode, unspecified: Secondary | ICD-10-CM | POA: Diagnosis present

## 2017-08-05 DIAGNOSIS — I739 Peripheral vascular disease, unspecified: Secondary | ICD-10-CM | POA: Diagnosis present

## 2017-08-05 DIAGNOSIS — R7989 Other specified abnormal findings of blood chemistry: Secondary | ICD-10-CM

## 2017-08-05 DIAGNOSIS — R079 Chest pain, unspecified: Secondary | ICD-10-CM

## 2017-08-05 DIAGNOSIS — F419 Anxiety disorder, unspecified: Secondary | ICD-10-CM | POA: Diagnosis present

## 2017-08-05 DIAGNOSIS — I2510 Atherosclerotic heart disease of native coronary artery without angina pectoris: Secondary | ICD-10-CM | POA: Diagnosis present

## 2017-08-05 DIAGNOSIS — Z87891 Personal history of nicotine dependence: Secondary | ICD-10-CM | POA: Diagnosis not present

## 2017-08-05 DIAGNOSIS — I200 Unstable angina: Secondary | ICD-10-CM

## 2017-08-05 LAB — CBC
HCT: 43.9 % (ref 35.0–47.0)
Hemoglobin: 15 g/dL (ref 12.0–16.0)
MCH: 32.4 pg (ref 26.0–34.0)
MCHC: 34.2 g/dL (ref 32.0–36.0)
MCV: 94.9 fL (ref 80.0–100.0)
Platelets: 238 10*3/uL (ref 150–440)
RBC: 4.63 MIL/uL (ref 3.80–5.20)
RDW: 13.4 % (ref 11.5–14.5)
WBC: 6.5 10*3/uL (ref 3.6–11.0)

## 2017-08-05 LAB — BASIC METABOLIC PANEL
ANION GAP: 8 (ref 5–15)
Anion gap: 13 (ref 5–15)
BUN: 12 mg/dL (ref 6–20)
BUN: 15 mg/dL (ref 6–20)
CALCIUM: 8.3 mg/dL — AB (ref 8.9–10.3)
CO2: 28 mmol/L (ref 22–32)
CO2: 27 mmol/L (ref 22–32)
Calcium: 8.8 mg/dL — ABNORMAL LOW (ref 8.9–10.3)
Chloride: 99 mmol/L — ABNORMAL LOW (ref 101–111)
Chloride: 105 mmol/L (ref 101–111)
Creatinine, Ser: 0.79 mg/dL (ref 0.44–1.00)
Creatinine, Ser: 0.76 mg/dL (ref 0.44–1.00)
GFR calc Af Amer: >60 mL/min (ref >60)
GFR calc non Af Amer: >60 mL/min (ref >60)
Glucose, Bld: 114 mg/dL — ABNORMAL HIGH (ref 65–99)
Glucose, Bld: 97 mg/dL (ref 65–99)
Potassium: 2.9 mmol/L — ABNORMAL LOW (ref 3.5–5.1)
Potassium: 3.6 mmol/L (ref 3.5–5.1)
SODIUM: 140 mmol/L (ref 135–145)
Sodium: 140 mmol/L (ref 135–145)

## 2017-08-05 LAB — PROTIME-INR
INR: 1.12
Prothrombin Time: 14.3 seconds (ref 11.4–15.2)

## 2017-08-05 LAB — APTT: APTT: 28 s (ref 24–36)

## 2017-08-05 LAB — HEPARIN LEVEL (UNFRACTIONATED): Heparin Unfractionated: 0.22 IU/mL — ABNORMAL LOW (ref 0.30–0.70)

## 2017-08-05 LAB — TROPONIN I
Troponin I: 0.07 ng/mL (ref <0.03)
Troponin I: 0.06 ng/mL (ref <0.03)

## 2017-08-05 LAB — GLUCOSE, CAPILLARY: GLUCOSE-CAPILLARY: 102 mg/dL — AB (ref 65–99)

## 2017-08-05 LAB — MRSA PCR SCREENING: MRSA BY PCR: NEGATIVE

## 2017-08-05 MED ORDER — TRAZODONE HCL 100 MG PO TABS
100.0000 mg | ORAL_TABLET | Freq: Every day | ORAL | Status: DC
  Administered 2017-08-05 – 2017-08-06 (×2): 100 mg via ORAL
  Filled 2017-08-05 (×2): qty 1

## 2017-08-05 MED ORDER — BISACODYL 10 MG RE SUPP: 10.0000 mg | Freq: Every day | RECTAL | Status: DC | PRN

## 2017-08-05 MED ORDER — PANTOPRAZOLE SODIUM 40 MG PO TBEC
40.0000 mg | DELAYED_RELEASE_TABLET | Freq: Two times a day (BID) | ORAL | Status: DC
  Administered 2017-08-05 – 2017-08-07 (×5): 40 mg via ORAL
  Filled 2017-08-05 (×5): qty 1

## 2017-08-05 MED ORDER — FUROSEMIDE 20 MG PO TABS
20.0000 mg | ORAL_TABLET | Freq: Every day | ORAL | Status: DC
  Administered 2017-08-07: 20 mg via ORAL
  Filled 2017-08-05: qty 1
  Filled 2017-08-05: qty 0.5
  Filled 2017-08-05: qty 1
  Filled 2017-08-05: qty 0.5

## 2017-08-05 MED ORDER — MAGNESIUM 250 MG PO TABS
250.0000 mg | ORAL_TABLET | Freq: Two times a day (BID) | ORAL | Status: DC
  Filled 2017-08-05 (×2): qty 1

## 2017-08-05 MED ORDER — FLUVOXAMINE MALEATE 50 MG PO TABS
200.0000 mg | ORAL_TABLET | Freq: Every day | ORAL | Status: DC
  Administered 2017-08-06: 200 mg via ORAL
  Filled 2017-08-05: qty 4
  Filled 2017-08-05: qty 2
  Filled 2017-08-05: qty 4

## 2017-08-05 MED ORDER — MOMETASONE FURO-FORMOTEROL FUM 200-5 MCG/ACT IN AERO
2.0000 | INHALATION_SPRAY | Freq: Two times a day (BID) | RESPIRATORY_TRACT | Status: DC
  Administered 2017-08-05 – 2017-08-07 (×4): 2 via RESPIRATORY_TRACT
  Filled 2017-08-05: qty 8.8

## 2017-08-05 MED ORDER — POTASSIUM CHLORIDE CRYS ER 20 MEQ PO TBCR
40.0000 meq | EXTENDED_RELEASE_TABLET | Freq: Once | ORAL | Status: AC
  Administered 2017-08-05: 40 meq via ORAL
  Filled 2017-08-05: qty 2

## 2017-08-05 MED ORDER — DIVALPROEX SODIUM 500 MG PO DR TAB
500.0000 mg | DELAYED_RELEASE_TABLET | Freq: Two times a day (BID) | ORAL | Status: DC
  Administered 2017-08-06: 500 mg via ORAL
  Filled 2017-08-05 (×6): qty 1

## 2017-08-05 MED ORDER — NITROGLYCERIN 2 % TD OINT
1.0000 [in_us] | TOPICAL_OINTMENT | Freq: Four times a day (QID) | TRANSDERMAL | Status: DC
  Administered 2017-08-05 – 2017-08-07 (×8): 1 [in_us] via TOPICAL
  Filled 2017-08-05 (×8): qty 1

## 2017-08-05 MED ORDER — CLOPIDOGREL BISULFATE 75 MG PO TABS
75.0000 mg | ORAL_TABLET | Freq: Every day | ORAL | Status: DC
  Administered 2017-08-05 – 2017-08-07 (×3): 75 mg via ORAL
  Filled 2017-08-05 (×3): qty 1

## 2017-08-05 MED ORDER — HALOPERIDOL 5 MG PO TABS
10.0000 mg | ORAL_TABLET | Freq: Every day | ORAL | Status: DC
  Administered 2017-08-06: 10 mg via ORAL
  Filled 2017-08-05 (×3): qty 2

## 2017-08-05 MED ORDER — TAMSULOSIN HCL 0.4 MG PO CAPS
0.4000 mg | ORAL_CAPSULE | Freq: Every day | ORAL | Status: DC
  Administered 2017-08-07: 0.4 mg via ORAL
  Filled 2017-08-05 (×3): qty 1

## 2017-08-05 MED ORDER — HEPARIN BOLUS VIA INFUSION
800.0000 [IU] | Freq: Once | INTRAVENOUS | Status: AC
  Administered 2017-08-05: 800 [IU] via INTRAVENOUS
  Filled 2017-08-05: qty 800

## 2017-08-05 MED ORDER — MAGNESIUM GLUCONATE 500 MG PO TABS
250.0000 mg | ORAL_TABLET | Freq: Two times a day (BID) | ORAL | Status: DC
  Administered 2017-08-05 – 2017-08-07 (×3): 250 mg via ORAL
  Filled 2017-08-05 (×5): qty 1

## 2017-08-05 MED ORDER — ATORVASTATIN CALCIUM 20 MG PO TABS
80.0000 mg | ORAL_TABLET | Freq: Every day | ORAL | Status: DC
  Administered 2017-08-05 – 2017-08-07 (×3): 80 mg via ORAL
  Filled 2017-08-05 (×3): qty 4

## 2017-08-05 MED ORDER — ASPIRIN EC 81 MG PO TBEC
81.0000 mg | DELAYED_RELEASE_TABLET | Freq: Every day | ORAL | Status: DC
  Administered 2017-08-06 – 2017-08-07 (×2): 81 mg via ORAL
  Filled 2017-08-05 (×2): qty 1

## 2017-08-05 MED ORDER — ACETAMINOPHEN 325 MG PO TABS: 650.0000 mg | ORAL_TABLET | Freq: Four times a day (QID) | ORAL | Status: DC | PRN

## 2017-08-05 MED ORDER — POTASSIUM CHLORIDE IN NACL 40-0.9 MEQ/L-% IV SOLN
INTRAVENOUS | Status: DC
  Administered 2017-08-05 – 2017-08-06 (×2): 100 mL/h via INTRAVENOUS
  Filled 2017-08-05 (×4): qty 1000

## 2017-08-05 MED ORDER — ONDANSETRON HCL 4 MG/2ML IJ SOLN: 4.0000 mg | Freq: Four times a day (QID) | INTRAMUSCULAR | Status: DC | PRN

## 2017-08-05 MED ORDER — HEPARIN (PORCINE) IN NACL 100-0.45 UNIT/ML-% IJ SOLN
900.0000 [IU]/h | INTRAMUSCULAR | Status: DC
  Administered 2017-08-05: 700 [IU]/h via INTRAVENOUS
  Filled 2017-08-05 (×2): qty 250

## 2017-08-05 MED ORDER — DOCUSATE SODIUM 100 MG PO CAPS
100.0000 mg | ORAL_CAPSULE | Freq: Two times a day (BID) | ORAL | Status: DC
  Administered 2017-08-06 – 2017-08-07 (×2): 100 mg via ORAL
  Filled 2017-08-05 (×5): qty 1

## 2017-08-05 MED ORDER — HEPARIN BOLUS VIA INFUSION
3400.0000 [IU] | Freq: Once | INTRAVENOUS | Status: AC
  Administered 2017-08-05: 3400 [IU] via INTRAVENOUS
  Filled 2017-08-05: qty 3400

## 2017-08-05 MED ORDER — MAGNESIUM SULFATE 2 GM/50ML IV SOLN
2.0000 g | Freq: Once | INTRAVENOUS | Status: AC
  Administered 2017-08-05: 2 g via INTRAVENOUS

## 2017-08-05 MED ORDER — CARVEDILOL 12.5 MG PO TABS
12.5000 mg | ORAL_TABLET | Freq: Two times a day (BID) | ORAL | Status: DC
  Administered 2017-08-05 – 2017-08-07 (×4): 12.5 mg via ORAL
  Filled 2017-08-05 (×2): qty 1
  Filled 2017-08-05 (×2): qty 2

## 2017-08-05 MED ORDER — ALPRAZOLAM 1 MG PO TABS
1.0000 mg | ORAL_TABLET | Freq: Three times a day (TID) | ORAL | Status: DC | PRN
  Administered 2017-08-07: 1 mg via ORAL
  Filled 2017-08-05: qty 1

## 2017-08-05 MED ORDER — MORPHINE SULFATE (PF) 2 MG/ML IV SOLN
2.0000 mg | INTRAVENOUS | Status: DC | PRN
  Administered 2017-08-06 – 2017-08-07 (×2): 2 mg via INTRAVENOUS
  Filled 2017-08-05 (×2): qty 1

## 2017-08-05 MED ORDER — TRAMADOL HCL 50 MG PO TABS: 50.0000 mg | ORAL_TABLET | Freq: Four times a day (QID) | ORAL | Status: DC | PRN

## 2017-08-05 MED ORDER — NITROGLYCERIN 2 % TD OINT: 1.0000 [in_us] | TOPICAL_OINTMENT | Freq: Once | TRANSDERMAL | Status: DC

## 2017-08-05 MED ORDER — ASPIRIN EC 81 MG PO TBEC: 81.0000 mg | DELAYED_RELEASE_TABLET | Freq: Once | ORAL | Status: DC

## 2017-08-05 MED ORDER — ONDANSETRON HCL 4 MG PO TABS: 4.0000 mg | ORAL_TABLET | Freq: Four times a day (QID) | ORAL | Status: DC | PRN

## 2017-08-05 MED ORDER — NITROGLYCERIN 0.4 MG SL SUBL
0.4000 mg | SUBLINGUAL_TABLET | SUBLINGUAL | Status: DC | PRN
  Administered 2017-08-07 (×3): 0.4 mg via SUBLINGUAL
  Filled 2017-08-05: qty 1

## 2017-08-05 MED ORDER — LISINOPRIL 5 MG PO TABS
10.0000 mg | ORAL_TABLET | Freq: Every day | ORAL | Status: DC
  Administered 2017-08-05 – 2017-08-07 (×3): 10 mg via ORAL
  Filled 2017-08-05: qty 2
  Filled 2017-08-05: qty 1
  Filled 2017-08-05: qty 2
  Filled 2017-08-05: qty 1

## 2017-08-05 MED ORDER — ACETAMINOPHEN 650 MG RE SUPP: 650.0000 mg | Freq: Four times a day (QID) | RECTAL | Status: DC | PRN

## 2017-08-05 MED ORDER — LORATADINE 10 MG PO TABS
10.0000 mg | ORAL_TABLET | Freq: Every day | ORAL | Status: DC
  Filled 2017-08-05 (×2): qty 1

## 2017-08-05 NOTE — Progress Notes (Signed)
ANTICOAGULATION CONSULT NOTE - Initial Consult  Pharmacy Consult for heparin Indication: chest pain/ACS  Allergies  Allergen Reactions  . Ferrous Sulfate Hives  . Sulfa Antibiotics     Patient Measurements: Height: 5\' 1"  (154.9 cm) Weight: 115 lb 1.3 oz (52.2 kg) IBW/kg (Calculated) : 47.8 Heparin Dosing Weight: 56.7 kg  Vital Signs: Temp: 97.9 F (36.6 C) (11/04 1600) Temp Source: Axillary (11/04 1600) BP: 118/64 (11/04 1900) Pulse Rate: 74 (11/04 1900)  Labs: Recent Labs    08/05/17 1138 08/05/17 1909  HGB 15.0  --   HCT 43.9  --   PLT 238  --   APTT 28  --   LABPROT 14.3  --   INR 1.12  --   HEPARINUNFRC  --  0.22*  CREATININE 0.79  --   TROPONINI 0.07* 0.06*    Estimated Creatinine Clearance: 60.7 mL/min (by C-G formula based on SCr of 0.79 mg/dL).   Medical History: Past Medical History:  Diagnosis Date  . Anemia   . Anginal pain ([place])   . Anxiety   . 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  This was done at [facility]  . BP (high blood pressure) 02/03/2016  . Cancer ([place])    melanoma skin cancer  . Carotid stenosis 02/10/2015  . Chronic kidney disease    UTI  . Colitis 12/15/2015  . Collagen vascular disease ([place])   . COPD (chronic obstructive pulmonary disease) ([place])   . Coronary artery disease   . Depression   . Esophageal candidiasis ([place])   . Gastritis   . GERD (gastroesophageal reflux disease)   . GI bleed 12/15/2015  . Headache   . Hypertension   . Ischemic colitis ([place]) 01/01/2016   Overview:   SMA mesenteric ischemia now status post angioplasty by  vascular of In stent restenosis.   Overview:  S?P SMA angioplasty   . Myocardial infarction ([place])   . Nausea and vomiting in adult   . Noninfectious diarrhea   . Peripheral vascular disease ([place]) 02/03/2016   Overview:  Follows with Dr. Delana Meyer    . Shortness of breath dyspnea    with exertion  . Stroke [facility])    TIA X 2  . Temporary cerebral vascular dysfunction 02/03/2016    Medications:  Infusions:  . 0.9 % NaCl with KCl 40 mEq / L 100 mL/hr at 08/05/17 1800  . heparin 700 Units/hr (08/05/17 1800)    Assessment:  56 yof cc CP/SOB. PMH CAD, MI, angina, HTN, stroke, PVD, COPD, depression, anxiety, CKD, GERD, headaches, anemia, colitis, GIB, N/V, collagen vascular disease. Initial troponin 0.07. ED ECG pending. Pharmacy consulted to dose UFH for ACS. No PTA OAC noted.   Goal of Therapy:  Heparin level 0.3-0.7 units/ml Monitor platelets by anticoagulation protocol: Yes   Plan:  Give 3400 units bolus x 1 Start heparin infusion at 700 units/hr Check anti-Xa level in 6 hours and daily while on heparin Continue to monitor H&H and platelets   11/4 19:09 HL subtherapeutic x 1. 800 units IV x 1 bolus and increase rate to  800 units/hr. Will recheck HL in 6 hours.  Laural Benes, Pharm.D., BCPS Clinical Pharmacist 08/05/2017,7:59 PM

## 2017-08-05 NOTE — ED Notes (Signed)
Patient given 324mg  of Aspirin and 1 spray of nitroglycerin by EMS

## 2017-08-05 NOTE — ED Triage Notes (Signed)
Patient from home via [facility] complaining of chest pain and mild shortness of breath that started last night. Patient has history of MI with CABG. Denies radiation of pain. Denies N/V or dizziness. Patient tachypnic upon arrival. Alert and oriented x4.

## 2017-08-05 NOTE — ED Provider Notes (Signed)
[facility] Emergency Department Provider Note    First MD Initiated Contact with Patient 08/05/17 1134     (approximate)  I have reviewed the triage vital signs and the nursing notes.   HISTORY  Chief Complaint Chest Pain    HPI JANACE DECKER is a 56 y.o. female presents from home with chief complaint of chest pain that started last night while at rest.  Describes the pain is a pressure and aching sensation.  States she is also felt weak in her legs.  Does feel mildly short of breath.  Denies any diaphoresis or nausea.  No recent fevers.  No cough.  Past Medical History:  Diagnosis Date  . Anemia   . Anginal pain ([place])   . Anxiety   . 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  This was done at [facility]  . BP (high blood pressure) 02/03/2016  . Cancer ([place])    melanoma skin cancer  . Carotid stenosis 02/10/2015  . Chronic kidney disease    UTI  . Colitis 12/15/2015  . Collagen vascular disease ([place])   . COPD (chronic obstructive pulmonary disease) ([place])   . Coronary artery disease   . Depression   . Esophageal candidiasis ([place])   . Gastritis   . GERD (gastroesophageal reflux disease)   . GI bleed 12/15/2015  . Headache   . Hypertension   . Ischemic colitis ([place]) 01/01/2016   Overview:   SMA mesenteric ischemia now status post angioplasty by vascular of In stent restenosis.   Overview:  S?P SMA angioplasty   . Myocardial infarction ([place])   . Nausea and vomiting in adult   . Noninfectious diarrhea   . Peripheral vascular disease ([place]) 02/03/2016   Overview:   Follows with Dr. Delana Meyer    . Shortness of breath dyspnea    with exertion  . Stroke [facility])    TIA X 2  . Temporary cerebral vascular dysfunction 02/03/2016   Family History  Problem Relation Age of Onset  . Breast cancer Sister   . CAD Father   . CAD Brother    Past Surgical History:  Procedure Laterality Date  . ABDOMINAL HYSTERECTOMY    . BACK SURGERY    . BREAST BIOPSY Left 6 2017   results not in yet  . CORONARY ANGIOPLASTY    . CORONARY ARTERY BYPASS GRAFT    . NOSE SURGERY     cancer removal  . OTHER SURGICAL HISTORY  Jul 11 2014   sternum removal  . STENT PLACEMENT VASCULAR ([place] HX)     Patient Active Problem List   Diagnosis Date Noted  . Unstable angina ([place]) 08/05/2017  . Hypokalemia 08/05/2017  . Elevated troponin 08/05/2017  . DVT (deep venous thrombosis) ([place]) 03/05/2017  . Severe recurrent major depression with psychotic features ([place]) 10/02/2016  . Benzodiazepine withdrawal ([place]) 10/02/2016  . Trichotillomania 10/02/2016  . Abdominal pain 04/19/2016  . Sepsis ([place]) 03/09/2016  . Urinary retention 03/09/2016  . CAD (coronary artery disease) 03/09/2016  . Frank hematuria 03/08/2016  . Temporary  cerebral vascular dysfunction 02/03/2016  . Peripheral vascular disease ([place]) 02/03/2016  . HTN (hypertension) 02/03/2016  . HLD (hyperlipidemia) 02/03/2016  . Clinical depression 02/03/2016  . Carotid artery narrowing 02/03/2016  . 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 Anxiety 02/03/2016  . Abdominal pain, generalized   . Nausea with vomiting   . Gastritis   . Esophageal candidiasis ([place])   . Noninfectious diarrhea   . Intractable pain 01/21/2016  . Upper abdominal pain   . Nausea and vomiting in adult   . Ischemic colitis ([place]) 01/01/2016  . Colitis 12/15/2015  . GI bleed 12/15/2015  . Acute inflammation of the pancreas 12/06/2015  . Vitamin B12 deficiency anemia 02/25/2015    . Absolute anemia 02/25/2015  . Carotid stenosis 02/10/2015  . Personal history of other specified conditions 11/27/2014  . Osteomyelitis ([place]) 11/27/2014  . H/O coronary artery bypass surgery 05/18/2014      Prior to Admission medications   Medication Sig Start Date End Date Taking? Authorizing Provider  ALPRAZolam (XANAX) 1 MG tablet TAKE 1 TABLET BY MOUTH TWO TO THREE TIMES A DAY 01/22/17  Yes [provider]  aspirin EC 81 MG tablet Take by mouth.   Yes [provider]  atorvastatin (LIPITOR) 80 MG tablet Take 80 mg by mouth daily.   Yes [provider]  carvedilol (COREG) 12.5 MG tablet TAKE 1 TABLET BY MOUTH TWICE DAILY WITH MEALS 12/18/16  Yes [provider]  clopidogrel (PLAVIX) 75 MG tablet Take 75 mg by mouth daily.  02/02/16  Yes [provider]  cyanocobalamin 1000 MCG tablet Take 1 tablet (1,000 mcg total) by mouth daily. 10/20/16  Yes Pucilowska, Jolanta B, MD  furosemide (LASIX) 20 MG tablet Take 20 mg by mouth.   Yes [provider]  lisinopril (ZESTRIL) 10 MG tablet Take 1 tablet (10 mg total) by mouth daily. 01/25/16  Yes Henreitta Leber, MD  Magnesium 250 MG TABS Take 250 mg by mouth 2 (two) times daily.   Yes [provider]  potassium chloride (MICRO-K) 10 MEQ CR capsule Take 10 mEq by mouth 2 (two) times daily.    Yes [provider]  acetaminophen (TYLENOL) 500 MG tablet Take 1,000 mg by mouth every 6 (six) hours as needed for mild pain or headache.    [provider]  clotrimazole (LOTRIMIN) 1 % cream APP EXT AA BID 12/02/15   [provider]  divalproex (DEPAKOTE) 500 MG DR tablet Take 1 tablet (500 mg total) by mouth every 12 (twelve) hours. Patient not taking: Reported on 08/05/2017 10/20/16   Pucilowska, Herma Ard B, MD  Fluticasone-Salmeterol (ADVAIR DISKUS) 250-50 MCG/DOSE AEPB Inhale 1 puff into the lungs 2 (two) times daily.     [provider]  fluvoxaMINE (LUVOX) 100  MG tablet Take 2 tablets (200 mg total) by mouth at bedtime. Patient not taking: Reported on 08/05/2017 10/20/16   Pucilowska, Herma Ard B, MD  haloperidol (HALDOL) 10 MG tablet Take 1 tablet (10 mg total) by mouth at bedtime. Patient not taking: Reported on 08/05/2017 10/20/16   Pucilowska, Herma Ard B, MD  hydrocortisone (ANUSOL-HC) 2.5 % rectal cream Apply topically.    [provider]  mometasone-formoterol (DULERA) 200-5 MCG/ACT AERO Inhale 2 puffs into the lungs 2 (two) times daily. Patient not taking: Reported on 06/11/2017 10/20/16   Pucilowska, Herma Ard B, MD  nitroGLYCERIN (NITROSTAT) 0.4 MG SL tablet Place 1 tablet (0.4 mg total) under  the tongue every 5 (five) minutes as needed for chest pain. 10/20/16   Pucilowska, Jolanta B, MD  pantoprazole (PROTONIX) 40 MG tablet Take 1 tablet (40 mg total) by mouth 2 (two) times daily. Patient not taking: Reported on 06/11/2017 01/25/16   Henreitta Leber, MD  polyethylene glycol powder [facility]) powder  01/09/14   [provider]  senna-docusate (SENOKOT-S) 8.6-50 MG tablet Take 1 tablet by mouth at bedtime as needed for mild constipation. Patient not taking: Reported on 06/11/2017 03/11/16   Nicholes Mango, MD  traMADol (ULTRAM) 50 MG tablet Take 1 tablet (50 mg total) by mouth every 6 (six) hours as needed. 03/20/17 03/20/18  Earleen Newport, MD  traZODone (DESYREL) 100 MG tablet Take 1 tablet (100 mg total) by mouth at bedtime. 10/20/16   Pucilowska, [place]ell Honour, MD    Allergies Ferrous sulfate and Sulfa antibiotics    Social History Social History   Tobacco Use  . Smoking status: Former Smoker    Packs/day: 1.00    Types: Cigarettes    Last attempt to quit: 08/01/2012    Years since quitting: 5.0  . Smokeless tobacco: Never Used  Substance Use Topics  . Alcohol use: No  . Drug use: No    Review of Systems Patient denies headaches, rhinorrhea, blurry vision, numbness, shortness of breath, chest pain, edema, cough,  abdominal pain, nausea, vomiting, diarrhea, dysuria, fevers, rashes or hallucinations unless otherwise stated above in HPI. ____________________________________________   PHYSICAL EXAM:  VITAL SIGNS: Vitals:   08/05/17 1136  BP: 124/88  Pulse: 89  Resp: (!) 24  Temp: 98.5 F (36.9 C)  SpO2: 97%    Constitutional: Alert and oriented.  Eyes: Conjunctivae are normal.  Head: Atraumatic. Nose: No congestion/rhinnorhea. Mouth/Throat: Mucous membranes are moist.   Neck: No stridor. Painless ROM.  Cardiovascular: Normal rate, regular rhythm. Grossly normal heart sounds.  Good peripheral circulation.  S/p sternotomy with scar Respiratory: Normal respiratory effort.  No retractions. Lungs CTAB. Gastrointestinal: Soft and nontender. No distention. No abdominal bruits. No CVA tenderness. Genitourinary:  Musculoskeletal: No lower extremity tenderness nor edema.  No joint effusions. Neurologic:  Normal speech and language. No gross focal neurologic deficits are appreciated. No facial droop Skin:  Skin is warm, dry and intact. No rash noted. Psychiatric: withdrawn, blunted affect ____________________________________________   LABS (all labs ordered are listed, but only abnormal results are displayed)  Results for orders placed or performed during the hospital encounter of 08/05/17 (from the past 24 hour(s))  Basic metabolic panel     Status: Abnormal   Collection Time: 08/05/17 11:38 AM  Result Value Ref Range   Sodium 140 135 - 145 mmol/L   Potassium 2.9 (L) 3.5 - 5.1 mmol/L   Chloride 99 (L) 101 - 111 mmol/L   CO2 28 22 - 32 mmol/L   Glucose, Bld 114 (H) 65 - 99 mg/dL   BUN 12 6 - 20 mg/dL   Creatinine, Ser 0.79 0.44 - 1.00 mg/dL   Calcium 8.8 (L) 8.9 - 10.3 mg/dL   GFR calc non Af Amer >60 >60 mL/min   GFR calc Af Amer >60 >60 mL/min   Anion gap 13 5 - 15  CBC     Status: None   Collection Time: 08/05/17 11:38 AM  Result Value Ref Range   WBC 6.5 3.6 - 11.0 K/uL   RBC 4.63  3.80 - 5.20 MIL/uL   Hemoglobin 15.0 12.0 - 16.0 g/dL   HCT 43.9 35.0 - 47.0 %  MCV 94.9 80.0 - 100.0 fL   MCH 32.4 26.0 - 34.0 pg   MCHC 34.2 32.0 - 36.0 g/dL   RDW 13.4 11.5 - 14.5 %   Platelets 238 150 - 440 K/uL  Troponin I     Status: Abnormal   Collection Time: 08/05/17 11:38 AM  Result Value Ref Range   Troponin I 0.07 (HH) <0.03 ng/mL  Protime-INR (order if Patient is taking Coumadin / Warfarin)     Status: None   Collection Time: 08/05/17 11:38 AM  Result Value Ref Range   Prothrombin Time 14.3 11.4 - 15.2 seconds   INR 1.12   APTT     Status: None   Collection Time: 08/05/17 11:38 AM  Result Value Ref Range   aPTT 28 24 - 36 seconds   ____________________________________________  EKG My review and personal interpretation at Time: 11:33   Indication: chest pain  Rate: 90  Rhythm: sinus Axis: normal Other: flipped t waves in inferior leads as compared to previous, no STEMI ____________________________________________  RADIOLOGY  I personally reviewed all radiographic images ordered to evaluate for the above acute complaints and reviewed radiology reports and findings.  These findings were personally discussed with the patient.  Please see medical record for radiology report. ____________________________________________   PROCEDURES  Procedure(s) performed:  Procedures    Critical Care performed: yes CRITICAL CARE Performed by: Merlyn Lot   Total critical care time: 39 minutes  Critical care time was exclusive of separately billable procedures and treating other patients.  Critical care was necessary to treat or prevent imminent or life-threatening deterioration.  Critical care was time spent personally by me on the following activities: development of treatment plan with patient and/or surrogate as well as nursing, discussions with consultants, evaluation of patient's response to treatment, examination of patient, obtaining history from patient or  surrogate, ordering and performing treatments and interventions, ordering and review of laboratory studies, ordering and review of radiographic studies, pulse oximetry and re-evaluation of patient's condition.  ____________________________________________   INITIAL IMPRESSION / ASSESSMENT AND PLAN / ED COURSE  Pertinent labs & imaging results that were available during my care of the patient were reviewed by me and considered in my medical decision making (see chart for details).  DDX: ACS, pericarditis, esophagitis, boerhaaves, pe, dissection, pna, bronchitis, costochondritis   KLARE CRISS is a 56 y.o. who presents to the ED with chest pain as described above.  She is afebrile and fortunately is hemodynamically stable.  Does have some mild tachypnea but breath sounds are clear throughout and has good oxygenation.  EKG does show some inferior T wave changes and patient's troponin is elevated to 0.07.  Based on her history I am concern for unstable angina therefore will be started on drip.  Patient already received aspirin.  I spoke with Dr. Yancey Flemings of cardiology who is also requested Nitropaste.  Patient will be admitted to the hospital.  I spoke with Dr. Doy Hutching who kindly agrees to admit patient for further evaluation and management.      ____________________________________________   FINAL CLINICAL IMPRESSION(S) / ED DIAGNOSES  Final diagnoses:  Chest pain, unspecified type  Elevated troponin I level      NEW MEDICATIONS STARTED DURING THIS VISIT:  This SmartLink is deprecated. Use AVSMEDLIST instead to display the medication list for a patient.   Note:  This document was prepared using Dragon voice recognition software and may include unintentional dictation errors.    Merlyn Lot, MD 08/05/17 1330

## 2017-08-05 NOTE — Progress Notes (Signed)
[facility] Progress Note Patient Name: Kirsten Castro DOB: November 22, 1960 MRN: [id number]   Date of Service  08/05/2017  HPI/Events of Note  Request to recheck K+ which was 2.9 this AM and replaced.   eICU Interventions  Will order BMP STAT.     Intervention Category Major Interventions: Electrolyte abnormality - evaluation and management  Karrine Kluttz [place] 08/05/2017, 10:47 PM

## 2017-08-05 NOTE — Progress Notes (Addendum)
Pt to unit approx 1415, flat affect, reports CP 6 on 0-10 scale, VSS on room air.  Gave pt her meds and added nitro paste to chest, w/i 1 hour she reported pain of 1 on 0-10 scale, ate most of her clear liquid tray, resting quietly at this time.  Pt and husband report she does not take the following meds at home anymore: lasix, depakote, flo-max.  She also declined to take colace.  NS w/ 40K infusing, she also took 40 MEqs orally.  Received 2 gms IV mag.  Offered bedpan, she declines need to urinate at this time.  Continue to monitor for UOP.  She reports incontinence at home and is wearing her own brief.

## 2017-08-05 NOTE — H&P (Signed)
History and Physical    Kirsten Castro LKG:[id number] DOB: 29-Mar-1961 DOA: 08/05/2017  Referring physician: Dr. Quentin Cornwall PCP: Leonel Ramsay, MD  Specialists: Dr. Ubaldo Glassing  Chief Complaint: CP and SOB  HPI: Kirsten Castro is a 56 y.o. 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 In ER, pt continues to c/o CP and SOB despite treatment. EKG shows some ischemic changes and troponin elevated. She is now admitted. Pain is worse with palpation. Has chronci chest wall pain from osteomyelitis. No fever. No N/V/D.  Review of Systems: The patient denies anorexia, fever, weight loss,, vision loss, decreased hearing, hoarseness,, syncope, peripheral edema, balance deficits, hemoptysis, abdominal pain, melena, hematochezia, severe indigestion/heartburn, hematuria, incontinence, genital sores, muscle weakness, suspicious skin lesions, transient blindness, difficulty walking, depression, unusual weight change, abnormal bleeding, enlarged lymph nodes, angioedema, and breast masses.   Past Medical History:  Diagnosis Date  . Anemia   . Anginal pain ([place])   . Anxiety   . 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  This was done at [facility]  . BP (high blood pressure) 02/03/2016  . Cancer ([place])    melanoma skin cancer  . Carotid stenosis 02/10/2015  . Chronic kidney disease    UTI  . Colitis 12/15/2015  . Collagen vascular disease ([place])   . COPD (chronic obstructive pulmonary disease) ([place])   . Coronary artery disease   . Depression    . Esophageal candidiasis ([place])   . Gastritis   . GERD (gastroesophageal reflux disease)   . GI bleed 12/15/2015  . Headache   . Hypertension   . Ischemic colitis ([place]) 01/01/2016   Overview:   SMA mesenteric ischemia now status post angioplasty by vascular of In stent restenosis.   Overview:  S?P SMA angioplasty   . Myocardial infarction ([place])   . Nausea and vomiting in adult   . Noninfectious diarrhea   . Peripheral vascular disease ([place]) 02/03/2016   Overview:  Follows with Dr. Delana Meyer    . Shortness of breath dyspnea    with exertion  . Stroke [facility])    TIA X 2  . Temporary cerebral vascular dysfunction 02/03/2016   Past Surgical History:  Procedure Laterality Date  . ABDOMINAL HYSTERECTOMY    . BACK SURGERY    . BREAST BIOPSY Left 6 2017   results not in yet  . CORONARY ANGIOPLASTY    . CORONARY ARTERY BYPASS GRAFT    . NOSE SURGERY     cancer removal  . OTHER SURGICAL HISTORY  Jul 11 2014   sternum removal  . STENT PLACEMENT VASCULAR ([place] [facility])     Social History:  reports that she quit smoking about 5 years ago. Her smoking use included cigarettes. She smoked 1.00 pack per day. she has never used smokeless tobacco. She reports that she does not drink alcohol or use drugs.  Allergies  Allergen Reactions  . Ferrous Sulfate Hives  . Sulfa Antibiotics     Family History  Problem Relation  Age of Onset  . Breast cancer Sister   . CAD Father   . CAD Brother     Prior to Admission medications   Medication Sig Start Date End Date Taking? Authorizing Provider  acetaminophen (TYLENOL) 500 MG tablet Take 1,000 mg by mouth every 6 (six) hours as needed for mild pain or headache.    [provider]  ALPRAZolam Duanne Moron) 1 MG tablet TAKE 1 TABLET BY MOUTH THREE TIMES DAILY AS NEEDED 01/22/17   [provider]  aspirin EC 81 MG tablet Take by mouth.    [provider]  atorvastatin (LIPITOR) 80 MG tablet Take 80 mg by mouth daily.    [provider]  carvedilol (COREG) 12.5 MG tablet TAKE 1 TABLET BY MOUTH TWICE DAILY WITH MEALS 12/18/16   [provider]  cetirizine (ZYRTEC) 10 MG tablet Take 10 mg by mouth daily. Reported on 02/08/2016    [provider]  clopidogrel (PLAVIX) 75 MG tablet Take 75 mg by mouth daily.  02/02/16   [provider]  clotrimazole (LOTRIMIN) 1 % cream APP EXT AA BID 12/02/15   [provider]  cyanocobalamin 1000 MCG tablet Take 1 tablet (1,000 mcg total) by mouth daily. 10/20/16   Pucilowska, Herma Ard B, MD  divalproex (DEPAKOTE) 500 MG DR tablet Take 1 tablet (500 mg total) by mouth every 12 (twelve) hours. 10/20/16   Pucilowska, Jolanta B, MD  Fluticasone-Salmeterol (ADVAIR DISKUS) 250-50 MCG/DOSE AEPB Inhale 1 puff into the lungs 2 (two) times daily.     [provider]  fluvoxaMINE (LUVOX) 100 MG tablet Take 2 tablets (200 mg total) by mouth at bedtime. 10/20/16   Pucilowska, Jolanta B, MD  furosemide (LASIX) 20 MG tablet Take 20 mg by mouth.    [provider]  haloperidol (HALDOL) 10 MG tablet Take 1 tablet (10 mg total) by mouth at bedtime. 10/20/16   Pucilowska, Jolanta B, MD  hydrocortisone (ANUSOL-HC) 2.5 % rectal cream Apply topically.    [provider]  lisinopril (ZESTRIL) 10 MG tablet Take 1 tablet (10 mg total) by mouth daily. 01/25/16   Henreitta Leber, MD  Magnesium 250 MG TABS Take 250 mg by mouth 2 (two) times daily.    [provider]  mometasone-formoterol (DULERA) 200-5 MCG/ACT AERO Inhale 2 puffs into the lungs 2 (two) times daily. Patient not taking: Reported on 06/11/2017 10/20/16   Pucilowska, Herma Ard B, MD  nitroGLYCERIN (NITROSTAT) 0.4 MG SL tablet Place 1 tablet (0.4 mg total) under the tongue every 5 (five) minutes as needed for chest pain. 10/20/16   Pucilowska, Jolanta B, MD  pantoprazole (PROTONIX) 40 MG tablet Take 1 tablet (40 mg total) by mouth 2 (two) times daily. Patient not taking: Reported on 06/11/2017 01/25/16    Henreitta Leber, MD  polyethylene glycol powder [facility]) powder  01/09/14   [provider]  potassium chloride (MICRO-K) 10 MEQ CR capsule Take 10 mEq by mouth 2 (two) times daily.     [provider]  senna-docusate (SENOKOT-S) 8.6-50 MG tablet Take 1 tablet by mouth at bedtime as needed for mild constipation. Patient not taking: Reported on 06/11/2017 03/11/16   Nicholes Mango, MD  tamsulosin (FLOMAX) 0.4 MG CAPS capsule TAKE ONE CAPSULE BY MOUTH DAILY WITH BREAKFAST 07/13/14   [provider]  traMADol (ULTRAM) 50 MG tablet Take 1 tablet (50 mg total) by mouth every 6 (six) hours as needed. 03/20/17 03/20/18  Earleen Newport, MD  traZODone ([place])  100 MG tablet Take 1 tablet (100 mg total) by mouth at bedtime. 10/20/16   Pucilowska, Wardell Honour, MD   Physical Exam: Vitals:   08/05/17 1136  BP: 124/88  Pulse: 89  Resp: (!) 24  Temp: 98.5 F (36.9 C)  TempSrc: Oral  SpO2: 97%  Weight: 56.7 kg (125 lb)  Height: 5\' 1"  (1.549 m)     General:  No apparent distress, WDWN, [place]/AT  Eyes: PERRL, EOMI, no scleral icterus, conjunctiva clear  ENT: moist oropharynx without exudate, TM's benign, dentition poor  Neck: supple, no lymphadenopathy. No bruits or thyromegaly  Cardiovascular: regular rate without MRG; 2+ peripheral pulses, no JVD, no peripheral edema  Respiratory: CTA biL, good air movement without wheezing, rhonchi or crackled. Respiratory effort normal  Abdomen: soft, non tender to palpation, positive bowel sounds, no guarding, no rebound  Skin: no rashes or lesions  Musculoskeletal: normal bulk and tone, no joint swelling  Psychiatric: normal mood and affect, A&OX3  Neurologic: CN 2-12 grossly intact, Motor strength 5/5 in all 4 groups with symmetric DTR's and non-focal sensory exam  Labs on Admission:  Basic Metabolic Panel: Recent Labs  Lab 08/05/17 1138  NA 140  K 2.9*  CL 99*  CO2 28  GLUCOSE 114*  BUN 12  CREATININE  0.79  CALCIUM 8.8*   Liver Function Tests: No results for input(s): AST, ALT, ALKPHOS, BILITOT, PROT, ALBUMIN in the last 168 hours. No results for input(s): LIPASE, AMYLASE in the last 168 hours. No results for input(s): AMMONIA in the last 168 hours. CBC: Recent Labs  Lab 08/05/17 1138  WBC 6.5  HGB 15.0  HCT 43.9  MCV 94.9  PLT 238   Cardiac Enzymes: Recent Labs  Lab 08/05/17 1138  TROPONINI 0.07*    BNP (last 3 results) No results for input(s): BNP in the last 8760 hours.  ProBNP (last 3 results) No results for input(s): PROBNP in the last 8760 hours.  CBG: No results for input(s): GLUCAP in the last 168 hours.  Radiological Exams on Admission: Dg Chest 2 View  Result Date: 08/05/2017 CLINICAL DATA:  Chest pain. Short of breath. Symptoms since last night. EXAM: CHEST  2 VIEW COMPARISON:  10/02/2016 FINDINGS: The heart is moderately enlarged. Vascularity is within normal limits. There is a stent in the left subclavian artery. Status post CABG and coronary stents. Clear lungs. No pneumothorax or pleural effusion. T12 compression deformity with its bone cement is stable. IMPRESSION: No active cardiopulmonary disease. Electronically Signed   By: Marybelle Killings M.D.   On: 08/05/2017 12:30    EKG: Independently reviewed.  Assessment/Plan Principal Problem:   Unstable angina (HCC) Active Problems:   CAD (coronary artery disease)   Hypokalemia   Elevated troponin   Will begin IV Heparin drip and admit to Stepdown. Follow enzymes. NTP and prn morphine for now. Echo ordered. Consult Cardiology. Repeat labs in AM. Supplement K+ and give empiric magnesium  Diet: clear liquids Fluids: NS with K+ DVT Prophylaxis: IV Heparin  Code Status: FULL  Family Communication: yes  Disposition Plan: home  Time spent: 50 min

## 2017-08-05 NOTE — Progress Notes (Signed)
ANTICOAGULATION CONSULT NOTE - Initial Consult  Pharmacy Consult for heparin Indication: chest pain/ACS  Allergies  Allergen Reactions  . Ferrous Sulfate Hives  . Sulfa Antibiotics     Patient Measurements: Height: 5\' 1"  (154.9 cm) Weight: 125 lb (56.7 kg) IBW/kg (Calculated) : 47.8 Heparin Dosing Weight: 56.7 kg  Vital Signs: Temp: 98.5 F (36.9 C) (11/04 1136) Temp Source: Oral (11/04 1136) BP: 124/88 (11/04 1136) Pulse Rate: 89 (11/04 1136)  Labs: Recent Labs    08/05/17 1138  HGB 15.0  HCT 43.9  PLT 238  LABPROT 14.3  INR 1.12  CREATININE 0.79  TROPONINI 0.07*    Estimated Creatinine Clearance: 60.7 mL/min (by C-G formula based on SCr of 0.79 mg/dL).   Medical History: Past Medical History:  Diagnosis Date  . Anemia   . Anginal pain ([place])   . Anxiety   . 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  This was done at [facility]  . BP (high blood pressure) 02/03/2016  . Cancer ([place])    melanoma skin cancer  . Carotid stenosis 02/10/2015  . Chronic kidney disease    UTI  . Colitis 12/15/2015  . Collagen vascular disease ([place])   . COPD (chronic obstructive pulmonary disease) ([place])   . Coronary artery disease   . Depression   . Esophageal candidiasis ([place])   . Gastritis   . GERD (gastroesophageal reflux disease)   . GI bleed 12/15/2015  . Headache   . Hypertension   . Ischemic colitis ([place]) 01/01/2016   Overview:   SMA mesenteric ischemia now status post angioplasty by vascular of In stent restenosis.   Overview:  S?P SMA angioplasty   . Myocardial infarction ([place])   .  Nausea and vomiting in adult   . Noninfectious diarrhea   . Peripheral vascular disease ([place]) 02/03/2016   Overview:  Follows with Dr. Delana Meyer    . Shortness of breath dyspnea    with exertion  . Stroke [facility])    TIA X 2  . Temporary cerebral vascular dysfunction 02/03/2016    Medications:  Infusions:  . heparin      Assessment:  56 yof cc CP/SOB. PMH CAD, MI, angina, HTN, stroke, PVD, COPD, depression, anxiety, CKD, GERD, headaches, anemia, colitis, GIB, N/V, collagen vascular disease. Initial troponin 0.07. ED ECG pending. Pharmacy consulted to dose UFH for ACS. No PTA OAC noted.   Goal of Therapy:  Heparin level 0.3-0.7 units/ml Monitor platelets by anticoagulation protocol: Yes   Plan:  Give 3400 units bolus x 1 Start heparin infusion at 700 units/hr Check anti-Xa level in 6 hours and daily while on heparin Continue to monitor H&H and platelets  Laural Benes, Pharm.D., BCPS Clinical Pharmacist 08/05/2017,12:25 PM

## 2017-08-06 ENCOUNTER — Inpatient Hospital Stay
Admit: 2017-08-06 | Discharge: 2017-08-06 | Disposition: A | Discharge status: Home / Self Care | Payer: BLUE CROSS/BLUE SHIELD | Attending: Internal Medicine | Admitting: Internal Medicine

## 2017-08-06 ENCOUNTER — Encounter: Payer: Self-pay | Admitting: Internal Medicine

## 2017-08-06 ENCOUNTER — Encounter
Admission: EM | Disposition: A | Discharge status: Home / Self Care | Payer: Self-pay | Source: Home / Self Care | Attending: Internal Medicine

## 2017-08-06 HISTORY — PX: LEFT HEART CATH AND CORS/GRAFTS ANGIOGRAPHY: CATH118250

## 2017-08-06 LAB — COMPREHENSIVE METABOLIC PANEL
ALT: 13 U/L — AB (ref 14–54)
AST: 23 U/L (ref 15–41)
Albumin: 3.2 g/dL — ABNORMAL LOW (ref 3.5–5.0)
Alkaline Phosphatase: 42 U/L (ref 38–126)
Anion gap: 7 (ref 5–15)
BUN: 14 mg/dL (ref 6–20)
CHLORIDE: 106 mmol/L (ref 101–111)
CO2: 26 mmol/L (ref 22–32)
Calcium: 8.1 mg/dL — ABNORMAL LOW (ref 8.9–10.3)
Creatinine, Ser: 0.78 mg/dL (ref 0.44–1.00)
Glucose, Bld: 108 mg/dL — ABNORMAL HIGH (ref 65–99)
POTASSIUM: 3.4 mmol/L — AB (ref 3.5–5.1)
SODIUM: 139 mmol/L (ref 135–145)
Total Bilirubin: 1.8 mg/dL — ABNORMAL HIGH (ref 0.3–1.2)
Total Protein: 5.5 g/dL — ABNORMAL LOW (ref 6.5–8.1)

## 2017-08-06 LAB — CBC
HEMATOCRIT: 36.7 % (ref 35.0–47.0)
Hemoglobin: 12.4 g/dL (ref 12.0–16.0)
MCH: 32.4 pg (ref 26.0–34.0)
MCHC: 33.8 g/dL (ref 32.0–36.0)
MCV: 96 fL (ref 80.0–100.0)
Platelets: 199 10*3/uL (ref 150–440)
RBC: 3.82 MIL/uL (ref 3.80–5.20)
RDW: 13.8 % (ref 11.5–14.5)
WBC: 4.8 10*3/uL (ref 3.6–11.0)

## 2017-08-06 LAB — PROTIME-INR
INR: 1.29
Prothrombin Time: 16 seconds — ABNORMAL HIGH (ref 11.4–15.2)

## 2017-08-06 LAB — MAGNESIUM: MAGNESIUM: 1.4 mg/dL — AB (ref 1.7–2.4)

## 2017-08-06 LAB — ECHOCARDIOGRAM COMPLETE
Height: 61 in
WEIGHTICAEL: 1940.05 [oz_av]

## 2017-08-06 LAB — GLUCOSE, CAPILLARY: GLUCOSE-CAPILLARY: 84 mg/dL (ref 65–99)

## 2017-08-06 LAB — TROPONIN I: TROPONIN I: 0.04 ng/mL — AB (ref <0.03)

## 2017-08-06 LAB — HEPARIN LEVEL (UNFRACTIONATED)
Heparin Unfractionated: 0.27 IU/mL — ABNORMAL LOW (ref 0.30–0.70)
Heparin Unfractionated: 0.61 IU/mL (ref 0.30–0.70)

## 2017-08-06 SURGERY — LEFT HEART CATH AND CORS/GRAFTS ANGIOGRAPHY
Anesthesia: Moderate Sedation

## 2017-08-06 MED ORDER — IOPAMIDOL (ISOVUE-300) INJECTION 61%
INTRAVENOUS | Status: DC | PRN
Start: 2017-08-06 — End: 2017-08-06
  Administered 2017-08-06: 140 mL via INTRAVENOUS

## 2017-08-06 MED ORDER — SODIUM BICARBONATE 8.4 % IV SOLN: INTRAVENOUS | Status: DC

## 2017-08-06 MED ORDER — MAGNESIUM SULFATE 4 GM/100ML IV SOLN
4.0000 g | Freq: Once | INTRAVENOUS | Status: AC
  Administered 2017-08-06: 4 g via INTRAVENOUS
  Filled 2017-08-06: qty 100

## 2017-08-06 MED ORDER — SODIUM CHLORIDE 0.9% FLUSH
3.0000 mL | Freq: Two times a day (BID) | INTRAVENOUS | Status: DC
  Administered 2017-08-06: 3 mL via INTRAVENOUS

## 2017-08-06 MED ORDER — SODIUM BICARBONATE BOLUS VIA INFUSION: INTRAVENOUS | Status: DC

## 2017-08-06 MED ORDER — LIDOCAINE HCL (PF) 1 % IJ SOLN
INTRAMUSCULAR | Status: AC
  Filled 2017-08-06: qty 30

## 2017-08-06 MED ORDER — SODIUM CHLORIDE 0.9 % IV SOLN: INTRAVENOUS | Status: DC

## 2017-08-06 MED ORDER — SODIUM CHLORIDE 0.9 % WEIGHT BASED INFUSION: 1.0000 mL/kg/h | INTRAVENOUS | Status: DC

## 2017-08-06 MED ORDER — ASPIRIN 81 MG PO CHEW: 81.0000 mg | CHEWABLE_TABLET | ORAL | Status: DC

## 2017-08-06 MED ORDER — FENTANYL CITRATE (PF) 100 MCG/2ML IJ SOLN
INTRAMUSCULAR | Status: AC
  Filled 2017-08-06: qty 2

## 2017-08-06 MED ORDER — HEPARIN BOLUS VIA INFUSION
900.0000 [IU] | Freq: Once | INTRAVENOUS | Status: AC
  Administered 2017-08-06: 900 [IU] via INTRAVENOUS
  Filled 2017-08-06: qty 900

## 2017-08-06 MED ORDER — FENTANYL CITRATE (PF) 100 MCG/2ML IJ SOLN
INTRAMUSCULAR | Status: DC | PRN
  Administered 2017-08-06: 25 ug via INTRAVENOUS

## 2017-08-06 MED ORDER — MIDAZOLAM HCL 2 MG/2ML IJ SOLN
INTRAMUSCULAR | Status: AC
  Filled 2017-08-06: qty 2

## 2017-08-06 MED ORDER — SODIUM CHLORIDE 0.9 % IV SOLN: 250.0000 mL | INTRAVENOUS | Status: DC | PRN

## 2017-08-06 MED ORDER — SODIUM CHLORIDE 0.9 % WEIGHT BASED INFUSION
1.0000 mL/kg/h | INTRAVENOUS | Status: AC
  Administered 2017-08-06: 1 mL/kg/h via INTRAVENOUS

## 2017-08-06 MED ORDER — SODIUM CHLORIDE 0.9% FLUSH: 3.0000 mL | INTRAVENOUS | Status: DC | PRN

## 2017-08-06 MED ORDER — HEPARIN (PORCINE) IN NACL 2-0.9 UNIT/ML-% IJ SOLN
INTRAMUSCULAR | Status: AC
  Filled 2017-08-06: qty 500

## 2017-08-06 MED ORDER — ACETAMINOPHEN 325 MG PO TABS: 650.0000 mg | ORAL_TABLET | ORAL | Status: DC | PRN

## 2017-08-06 MED ORDER — POTASSIUM CHLORIDE 20 MEQ PO PACK
20.0000 meq | PACK | Freq: Once | ORAL | Status: AC
  Administered 2017-08-06: 20 meq via ORAL
  Filled 2017-08-06: qty 1

## 2017-08-06 MED ORDER — SODIUM CHLORIDE 0.9% FLUSH: 3.0000 mL | Freq: Two times a day (BID) | INTRAVENOUS | Status: DC

## 2017-08-06 MED ORDER — SODIUM CHLORIDE 0.9 % WEIGHT BASED INFUSION
3.0000 mL/kg/h | INTRAVENOUS | Status: DC
Start: 2017-08-07 — End: 2017-08-06
  Administered 2017-08-06: 3 mL/kg/h via INTRAVENOUS

## 2017-08-06 MED ORDER — MIDAZOLAM HCL 2 MG/2ML IJ SOLN
INTRAMUSCULAR | Status: DC | PRN
Start: 2017-08-06 — End: 2017-08-06
  Administered 2017-08-06: 1 mg via INTRAVENOUS

## 2017-08-06 MED ORDER — ONDANSETRON HCL 4 MG/2ML IJ SOLN: 4.0000 mg | Freq: Four times a day (QID) | INTRAMUSCULAR | Status: DC | PRN

## 2017-08-06 SURGICAL SUPPLY — 8 items
CATH INFINITI 5FR ANG PIGTAIL (CATHETERS) ×3 IMPLANT
CATH INFINITI 5FR JL4 (CATHETERS) ×3 IMPLANT
CATH INFINITI JR4 5F (CATHETERS) ×3 IMPLANT
KIT MANI 3VAL PERCEP (MISCELLANEOUS) ×3 IMPLANT
NEEDLE PERC 18GX7CM (NEEDLE) ×3 IMPLANT
PACK CARDIAC CATH (CUSTOM PROCEDURE TRAY) ×3 IMPLANT
SHEATH PINNACLE 5F 10CM (SHEATH) ×3 IMPLANT
WIRE EMERALD 3MM-J .035X150CM (WIRE) ×3 IMPLANT

## 2017-08-06 NOTE — Consult Note (Signed)
[facility] Consultation Note  Patient ID: Kirsten Castro, MRN: [id number], DOB/AGE: 06/05/1961 56 y.o. Admit date: 08/05/2017   Date of Consult: 08/06/2017 Primary Physician: Leonel Ramsay, MD Primary Cardiologist: Ubaldo Glassing  Chief Complaint:  Chief Complaint  Patient presents with  . Chest Pain   Reason for Consult: chest pain  HPI: 56 y.o. female with known coronary artery disease status post multiple PCI and stent placement in the past and coronary artery bypass graft with 3 vessels at diagonal obtuse marginal and left anterior descending artery for which the patient has done fairly well since postsurgical chest osteomyelitis. The patient then has been placed on appropriate medication management and ambulating well until the last several weeks when she's had some difficulty with the shortness of breath smothering and chest discomfort relieved by rest as well as with morphine and nitrates. This is waxing and waning of the last several days for which she was seen in the emergency room. EKG has shown some ST depression and slight T-wave inversion in inferolateral leads concerning for myocardial ischemia. Additionally she has had an elevated troponin is 0.06. Does have the essential hypertension makes hyperlipidemia and previous motor appropriate medication management including beta blocker and high intensity cholesterol therapy. Currently she feels fairly well and no further chest discomfort at this time  Past Medical History:  Diagnosis Date  . Anemia   . Anginal pain ([place])   . Anxiety   . 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  This was done at [facility]  . BP (high blood pressure) 02/03/2016  . Cancer ([place])    melanoma skin cancer  . Carotid stenosis 02/10/2015  . Chronic kidney disease    UTI  . Colitis 12/15/2015  . Collagen vascular disease ([place])   . COPD (chronic obstructive pulmonary disease) ([place])   . Coronary artery disease   . Depression   . Esophageal candidiasis ([place])   . Gastritis   . GERD (gastroesophageal reflux disease)   . GI bleed 12/15/2015  . Headache   . Hypertension   . Ischemic colitis ([place]) 01/01/2016   Overview:   SMA mesenteric ischemia now status post angioplasty by vascular of In stent restenosis.   Overview:  S?P SMA angioplasty   . Myocardial infarction ([place])   . Nausea and vomiting in adult   . Noninfectious diarrhea   . Peripheral vascular disease ([place]) 02/03/2016   Overview:  Follows with Dr. Delana Meyer    . Shortness of breath dyspnea    with exertion  . Stroke [facility])    TIA X 2  . Temporary cerebral vascular dysfunction 02/03/2016      Surgical History:  Past Surgical History:  Procedure Laterality Date  . ABDOMINAL HYSTERECTOMY    . BACK SURGERY    . BREAST BIOPSY Left 6 2017   results not in yet  . CORONARY ANGIOPLASTY    . CORONARY ARTERY BYPASS GRAFT    . NOSE SURGERY     cancer removal  . OTHER SURGICAL HISTORY  Jul 11 2014   sternum removal  .  STENT PLACEMENT VASCULAR ([facility] HX)       Home Meds: Prior to Admission medications   Medication Sig Start Date End Date Taking? Authorizing Provider  ALPRAZolam (XANAX) 1 MG tablet TAKE 1 TABLET BY MOUTH TWO TO THREE TIMES A DAY 01/22/17  Yes [provider]  aspirin EC 81 MG tablet Take by mouth.   Yes [provider]  atorvastatin (LIPITOR) 80 MG tablet Take 80 mg by mouth daily.   Yes [provider]  carvedilol (COREG) 12.5 MG tablet TAKE 1 TABLET BY MOUTH TWICE DAILY WITH MEALS 12/18/16  Yes [provider]  clopidogrel (PLAVIX) 75 MG  tablet Take 75 mg by mouth daily.  02/02/16  Yes [provider]  cyanocobalamin 1000 MCG tablet Take 1 tablet (1,000 mcg total) by mouth daily. 10/20/16  Yes Pucilowska, Jolanta B, MD  furosemide (LASIX) 20 MG tablet Take 20 mg by mouth.   Yes [provider]  lisinopril (ZESTRIL) 10 MG tablet Take 1 tablet (10 mg total) by mouth daily. 01/25/16  Yes Henreitta Leber, MD  Magnesium 250 MG TABS Take 250 mg by mouth 2 (two) times daily.   Yes [provider]  potassium chloride (MICRO-K) 10 MEQ CR capsule Take 10 mEq by mouth 2 (two) times daily.    Yes [provider]  acetaminophen (TYLENOL) 500 MG tablet Take 1,000 mg by mouth every 6 (six) hours as needed for mild pain or headache.    [provider]  clotrimazole (LOTRIMIN) 1 % cream APP EXT AA BID 12/02/15   [provider]  divalproex (DEPAKOTE) 500 MG DR tablet Take 1 tablet (500 mg total) by mouth every 12 (twelve) hours. Patient not taking: Reported on 08/05/2017 10/20/16   Pucilowska, Herma Ard B, MD  Fluticasone-Salmeterol (ADVAIR DISKUS) 250-50 MCG/DOSE AEPB Inhale 1 puff into the lungs 2 (two) times daily.     [provider]  fluvoxaMINE (LUVOX) 100 MG tablet Take 2 tablets (200 mg total) by mouth at bedtime. Patient not taking: Reported on 08/05/2017 10/20/16   Pucilowska, Herma Ard B, MD  haloperidol (HALDOL) 10 MG tablet Take 1 tablet (10 mg total) by mouth at bedtime. Patient not taking: Reported on 08/05/2017 10/20/16   Pucilowska, Herma Ard B, MD  hydrocortisone (ANUSOL-HC) 2.5 % rectal cream Apply topically.    [provider]  mometasone-formoterol (DULERA) 200-5 MCG/ACT AERO Inhale 2 puffs into the lungs 2 (two) times daily. Patient not taking: Reported on 06/11/2017 10/20/16   Pucilowska, Herma Ard B, MD  nitroGLYCERIN (NITROSTAT) 0.4 MG SL tablet Place 1 tablet (0.4 mg total) under the tongue every 5 (five) minutes as needed for chest pain. 10/20/16   Pucilowska, Jolanta B,  MD  pantoprazole (PROTONIX) 40 MG tablet Take 1 tablet (40 mg total) by mouth 2 (two) times daily. Patient not taking: Reported on 06/11/2017 01/25/16   Henreitta Leber, MD  polyethylene glycol powder [facility]) powder  01/09/14   [provider]  senna-docusate (SENOKOT-S) 8.6-50 MG tablet Take 1 tablet by mouth at bedtime as needed for mild constipation. Patient not taking: Reported on 06/11/2017 03/11/16   Nicholes Mango, MD  traMADol (ULTRAM) 50 MG tablet Take 1 tablet (50 mg total) by mouth every 6 (six) hours as needed. 03/20/17 03/20/18  Earleen [place], MD  traZODone (DESYREL) 100 MG tablet Take 1 tablet (100 mg total) by mouth at bedtime. 10/20/16   Pucilowska, Wardell Honour, MD    Inpatient Medications:  . aspirin EC  81 mg  Oral Daily  . atorvastatin  80 mg Oral Daily  . carvedilol  12.5 mg Oral BID WC  . clopidogrel  75 mg Oral Daily  . divalproex  500 mg Oral Q12H  . docusate sodium  100 mg Oral BID  . fluvoxaMINE  200 mg Oral QHS  . furosemide  20 mg Oral Daily  . haloperidol  10 mg Oral QHS  . lisinopril  10 mg Oral Daily  . loratadine  10 mg Oral Daily  . magnesium gluconate  250 mg Oral BID  . mometasone-formoterol  2 puff Inhalation BID  . nitroGLYCERIN  1 inch Topical Q6H  . pantoprazole  40 mg Oral BID  . tamsulosin  0.4 mg Oral Daily  . traZODone  100 mg Oral QHS   . 0.9 % NaCl with KCl 40 mEq / L 100 mL/hr (08/06/17 0006)  . heparin 900 Units/hr (08/06/17 0522)    Allergies:  Allergies  Allergen Reactions  . Ferrous Sulfate Hives  . Sulfa Antibiotics     Social History   Socioeconomic History  . Marital status: Married    Spouse name: Not on file  . Number of children: Not on file  . Years of education: Not on file  . Highest education level: Not on file  Social Needs  . Financial resource strain: Not on file  . Food insecurity - worry: Not on file  . Food insecurity - inability: Not on file  . Transportation needs - medical: Not on  file  . Transportation needs - non-medical: Not on file  Occupational History  . Not on file  Tobacco Use  . Smoking status: Former Smoker    Packs/day: 1.00    Types: Cigarettes    Last attempt to quit: 08/01/2012    Years since quitting: 5.0  . Smokeless tobacco: Never Used  Substance and Sexual Activity  . Alcohol use: No  . Drug use: No  . Sexual activity: Yes  Other Topics Concern  . Not on file  Social History Narrative  . Not on file     Family History  Problem Relation Age of Onset  . Breast cancer Sister   . CAD Father   . CAD Brother      Review of Systems Positive for chest pain shortness of breath Negative for: General:  chills, fever, night sweats or weight changes.  Cardiovascular: PND orthopnea syncope dizziness  Dermatological skin lesions rashes Respiratory: Cough congestion Urologic: Frequent urination urination at night and hematuria Abdominal: negative for nausea, vomiting, diarrhea, bright red blood per rectum, melena, or hematemesis Neurologic: negative for visual changes, and/or hearing changes  All other systems reviewed and are otherwise negative except as noted above.  Labs: Recent Labs    08/05/17 1138 08/05/17 1909 08/06/17 0206  TROPONINI 0.07* 0.06* 0.04*   Lab Results  Component Value Date   WBC 4.8 08/06/2017   HGB 12.4 08/06/2017   HCT 36.7 08/06/2017   MCV 96.0 08/06/2017   PLT 199 08/06/2017    Recent Labs  Lab 08/06/17 0206  NA 139  K 3.4*  CL 106  CO2 26  BUN 14  CREATININE 0.78  CALCIUM 8.1*  PROT 5.5*  BILITOT 1.8*  ALKPHOS 42  ALT 13*  AST 23  GLUCOSE 108*   Lab Results  Component Value Date   CHOL 134 10/03/2016   HDL 53 10/03/2016   LDLCALC 63 10/03/2016   TRIG 88 10/03/2016   No results found for: DDIMER  Radiology/Studies:  Dg Chest 2 View  Result Date: 08/05/2017 CLINICAL DATA:  Chest pain. Short of breath. Symptoms since last night. EXAM: CHEST  2 VIEW COMPARISON:  10/02/2016 FINDINGS:  The heart is moderately enlarged. Vascularity is within normal limits. There is a stent in the left subclavian artery. Status post CABG and coronary stents. Clear lungs. No pneumothorax or pleural effusion. T12 compression deformity with its bone cement is stable. IMPRESSION: No active cardiopulmonary disease. Electronically Signed   By: Marybelle Killings M.D.   On: 08/05/2017 12:30    EKG: Normal sinus rhythm with inferior ST and T-wave changes  Weights: Filed Weights   08/05/17 1136 08/05/17 1407 08/06/17 0453  Weight: 56.7 kg (125 lb) 52.2 kg (115 lb 1.3 oz) 55 kg (121 lb 4.1 oz)     Physical Exam: Blood pressure 135/69, pulse 63, temperature 98.1 F (36.7 C), temperature source Oral, resp. rate (!) 25, height 5\' 1"  (1.549 m), weight 55 kg (121 lb 4.1 oz), SpO2 97 %. Body mass index is 22.91 kg/m. General: Well developed, well nourished, in no acute distress. Head eyes ears nose throat: Normocephalic, atraumatic, sclera non-icteric, no xanthomas, nares are without discharge. No apparent thyromegaly and/or mass  Lungs: Normal respiratory effort.  no wheezes, no rales, no rhonchi.  Heart: RRR with normal S1 S2. no murmur gallop, no rub, PMI is normal size and placement, carotid upstroke normal without bruit, jugular venous pressure is normal Abdomen: Soft, non-tender, non-distended with normoactive bowel sounds. No hepatomegaly. No rebound/guarding. No obvious abdominal masses. Abdominal aorta is normal size without bruit Extremities: No edema. no cyanosis, no clubbing, no ulcers  Peripheral : 2+ bilateral upper extremity pulses, 2+ bilateral femoral pulses, 2+ bilateral dorsal pedal pulse Neuro: Alert and oriented. No facial asymmetry. No focal deficit. Moves all extremities spontaneously. Musculoskeletal: Normal muscle tone without kyphosis Psych:  Responds to questions appropriately with a normal affect.     Assessment: 56 year old female with known coronary disease has recurrent bypass  graft previous myocardial infarction with the acute onset of severe shortness of breath smothering and ST and T-wave changes inferior lateral leads concerning for myocardial ischemia and/or non-ST elevation myocardial infarction  Plan: 1. Continue heparin and other supportive care for treatment of possible non-ST of myocardial infarction 2. Dual antiplatelet therapy for possible myocardial infarction and three-vessel coronary artery disease  3. High intensity cholesterol therapy without change 4. Beta blocker and ACE inhibitor for hypertension control in cardiovascular disease 5. Proceed to cardiac catheterization to assess coronary anatomy and further treatment throat is necessary. Patient understands risk and benefits cardiac catheterization. This possibility does stroke heart attack infection bleeding or blood clot. Patient is at low risk for conscious sedation   Signed, Corey Skains M.D. [facility] 08/06/2017, 8:17 AM

## 2017-08-06 NOTE — Progress Notes (Signed)
1500 Back from Cath. Lab. Minx closed in right groin. Right groin level 0. No bruising noted. Right pedal and posterior tibial pulses present.

## 2017-08-06 NOTE — Progress Notes (Signed)
[facility] Encounter Note  Patient: Kirsten Castro / Admit Date: 08/05/2017 / Date of Encounter: 08/06/2017, 1:27 PM   Subjective: No more chest pain since admission. Troponin peak at 0.06. Evidence of congestive heart failure Cardiac catheterization showing normal LV systolic function with ejection fraction of 50% 60% left main 100% obtuse marginal 1 and 60% mid right coronary artery stenosis with diffuse moderate obtuse marginal 2 and 3 which are small vessels Patent bypass graft to diagonal 1 and LIMA to the LAD Occluded bypass graft to obtuse marginal 1 possibly consistent with current symptoms and distribution of EKG changes  Review of Systems: Positive for: Chest pain Negative for: Vision change, hearing change, syncope, dizziness, nausea, vomiting,diarrhea, bloody stool, stomach pain, cough, congestion, diaphoresis, urinary frequency, urinary pain,skin lesions, skin rashes Others previously listed  Objective: Telemetry: Normal sinus rhythm Physical Exam: Blood pressure 133/79, pulse 61, temperature 98.3 F (36.8 C), temperature source Oral, resp. rate (!) 28, height 5\' 1"  (1.549 m), weight 55 kg (121 lb 4.1 oz), SpO2 98 %. Body mass index is 22.91 kg/m. General: Well developed, well nourished, in no acute distress. Head: Normocephalic, atraumatic, sclera non-icteric, no xanthomas, nares are without discharge. Neck: No apparent masses Lungs: Normal respirations with no wheezes, no rhonchi, no rales , no crackles   Heart: Regular rate and rhythm, normal S1 S2, no murmur, no rub, no gallop, PMI is normal size and placement, carotid upstroke normal with  bruit, jugular venous pressure normal Abdomen: Soft, non-tender, non-distended with normoactive bowel sounds. No hepatosplenomegaly. Abdominal aorta is normal size with bruit Extremities: No edema, no clubbing, no cyanosis, no ulcers,  Peripheral: 2+ radial, 2+ femoral, 2+ dorsal pedal pulses Neuro: Alert  and oriented. Moves all extremities spontaneously. Psych:  Responds to questions appropriately with a normal affect.   Intake/Output Summary (Last 24 hours) at 08/06/2017 1327 Last data filed at 08/06/2017 1019 Gross per 24 hour  Intake 2729.87 ml  Output 100 ml  Net 2629.87 ml    Inpatient Medications:  . [START ON 08/07/2017] aspirin  81 mg Oral Pre-Cath  . [START ON 08/07/2017] aspirin  81 mg Oral Pre-Cath  . [MAR Hold] aspirin EC  81 mg Oral Daily  . [MAR Hold] atorvastatin  80 mg Oral Daily  . [MAR Hold] carvedilol  12.5 mg Oral BID WC  . [MAR Hold] clopidogrel  75 mg Oral Daily  . [MAR Hold] divalproex  500 mg Oral Q12H  . [MAR Hold] docusate sodium  100 mg Oral BID  . [MAR Hold] fluvoxaMINE  200 mg Oral QHS  . [MAR Hold] furosemide  20 mg Oral Daily  . [MAR Hold] haloperidol  10 mg Oral QHS  . [MAR Hold] lisinopril  10 mg Oral Daily  . [MAR Hold] loratadine  10 mg Oral Daily  . [MAR Hold] magnesium gluconate  250 mg Oral BID  . [MAR Hold] mometasone-formoterol  2 puff Inhalation BID  . [MAR Hold] nitroGLYCERIN  1 inch Topical Q6H  . [MAR Hold] pantoprazole  40 mg Oral BID  . [MAR Hold] potassium chloride  20 mEq Oral Once  . sodium chloride flush  3 mL Intravenous Q12H  . sodium chloride flush  3 mL Intravenous Q12H  . [MAR Hold] tamsulosin  0.4 mg Oral Daily  . [MAR Hold] traZODone  100 mg Oral QHS   Infusions:  . sodium chloride    . sodium chloride    . sodium chloride    . [START ON  08/07/2017] sodium chloride 3 mL/kg/hr (08/06/17 1226)   Followed by  . [START ON 08/07/2017] sodium chloride    . heparin 900 Units/hr (08/06/17 0522)    Labs: Recent Labs    08/05/17 2248 08/06/17 0206  NA 140 139  K 3.6 3.4*  CL 105 106  CO2 27 26  GLUCOSE 97 108*  BUN 15 14  CREATININE 0.76 0.78  CALCIUM 8.3* 8.1*  MG  --  1.4*   Recent Labs    08/06/17 0206  AST 23  ALT 13*  ALKPHOS 42  BILITOT 1.8*  PROT 5.5*  ALBUMIN 3.2*   Recent Labs    08/05/17 1138  08/06/17 0206  WBC 6.5 4.8  HGB 15.0 12.4  HCT 43.9 36.7  MCV 94.9 96.0  PLT 238 199   Recent Labs    08/05/17 1138 08/05/17 1909 08/06/17 0206  TROPONINI 0.07* 0.06* 0.04*   Invalid input(s): POCBNP No results for input(s): HGBA1C in the last 72 hours.   Weights: Filed Weights   08/05/17 1407 08/06/17 0453 08/06/17 1115  Weight: 52.2 kg (115 lb 1.3 oz) 55 kg (121 lb 4.1 oz) 55 kg (121 lb 4.1 oz)     Radiology/Studies:  Dg Chest 2 View  Result Date: 08/05/2017 CLINICAL DATA:  Chest pain. Short of breath. Symptoms since last night. EXAM: CHEST  2 VIEW COMPARISON:  10/02/2016 FINDINGS: The heart is moderately enlarged. Vascularity is within normal limits. There is a stent in the left subclavian artery. Status post CABG and coronary stents. Clear lungs. No pneumothorax or pleural effusion. T12 compression deformity with its bone cement is stable. IMPRESSION: No active cardiopulmonary disease. Electronically Signed   By: Marybelle Killings M.D.   On: 08/05/2017 12:30     Assessment and Recommendation  56 y.o. female with known coronary artery disease as described above has graft and previous stenting with essential hypertension makes hyperlipidemia and symptoms of smothering and shortness of breath consistent with possible non-ST elevation myocardial infarction and new evidence of occlusion of obtuse marginal 1 with occluded bypass graft to that vessel and patent graft to diagonal and LIMA to the LAD with moderate stenosis of right coronary artery. 1. Continue hypertension control and risk reduction cardiovascular event with beta blocker and ACE inhibitor as before 2. Dual antiplatelet therapy with Plavix and aspirin for further risk reduction of vascular disease and coronary artery disease status post what appears to be a non-ST elevation myocardial infarction 3. Addition of isosorbide 30 mg each day for occluded obtuse marginal 1 and occluded graft to obtuse marginal 1 and moderate  stenosis of mid right coronary artery 4. Further consideration of stress test to assist evaluate inferior myocardial ischemia to a significant degree that may signify need for further intervention at a later date of mid right coronary stenosis 5. Begin cardiac rehabilitation and physical activity and further treatment thereof is necessary as above  Signed, Serafina Royals M.D. FACC

## 2017-08-06 NOTE — Progress Notes (Signed)
Good day.Cardiac catherization around lunch time. Right groin site looks good. No hematoma. Remains in SR/SB. Affect is still flat with delayed verbal responses to all questions. Had to insist that patient void at 1700. She reluctantly did but complained because " she didn't have to go". No other void today. Dr. Mortimer Fries informed.

## 2017-08-06 NOTE — Progress Notes (Signed)
*  PRELIMINARY RESULTS* Echocardiogram 2D Echocardiogram has been performed.  Kirsten Castro 08/06/2017, 9:02 AM

## 2017-08-06 NOTE — Progress Notes (Signed)
ANTICOAGULATION CONSULT NOTE - Initial Consult  Pharmacy Consult for heparin Indication: chest pain/ACS  Allergies  Allergen Reactions  . Ferrous Sulfate Hives  . Sulfa Antibiotics     Patient Measurements: Height: 5\' 1"  (154.9 cm) Weight: 121 lb 4.1 oz (55 kg) IBW/kg (Calculated) : 47.8 Heparin Dosing Weight: 56.7 kg  Vital Signs: Temp: 98.1 F (36.7 C) (11/05 0400) Temp Source: Oral (11/05 0400) BP: 137/67 (11/05 0400) Pulse Rate: 67 (11/05 0400)  Labs: Recent Labs    08/05/17 1138 08/05/17 1909 08/05/17 2248 08/06/17 0206  HGB 15.0  --   --  12.4  HCT 43.9  --   --  36.7  PLT 238  --   --  199  APTT 28  --   --   --   LABPROT 14.3  --   --   --   INR 1.12  --   --   --   HEPARINUNFRC  --  0.22*  --  0.27*  CREATININE 0.79  --  0.76 0.78  TROPONINI 0.07* 0.06*  --  0.04*    Estimated Creatinine Clearance: 60.7 mL/min (by C-G formula based on SCr of 0.78 mg/dL).   Medical History: Past Medical History:  Diagnosis Date  . Anemia   . Anginal pain ([place])   . Anxiety   . 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  This was done at [facility]  . BP (high blood pressure) 02/03/2016  . Cancer ([place])    melanoma skin cancer  . Carotid stenosis 02/10/2015  . Chronic kidney disease    UTI  . Colitis 12/15/2015  . Collagen vascular disease ([place])   . COPD (chronic obstructive pulmonary disease) ([place])   . Coronary artery disease   . Depression   . Esophageal candidiasis ([place])   . Gastritis   . GERD (gastroesophageal reflux disease)   . GI bleed 12/15/2015  . Headache   .  Hypertension   . Ischemic colitis ([place]) 01/01/2016   Overview:   SMA mesenteric ischemia now status post angioplasty by vascular of In stent restenosis.   Overview:  S?P SMA angioplasty   . Myocardial infarction ([place])   . Nausea and vomiting in adult   . Noninfectious diarrhea   . Peripheral vascular disease ([place]) 02/03/2016   Overview:  Follows with Dr. Delana Meyer    . Shortness of breath dyspnea    with exertion  . Stroke [facility])    TIA X 2  . Temporary cerebral vascular dysfunction 02/03/2016    Medications:  Infusions:  . 0.9 % NaCl with KCl 40 mEq / L 100 mL/hr (08/06/17 0006)  . heparin 800 Units/hr (08/06/17 0000)    Assessment:  31 yof cc CP/SOB. PMH CAD, MI, angina, HTN, stroke, PVD, COPD, depression, anxiety, CKD, GERD, headaches, anemia, colitis, GIB, N/V, collagen vascular disease. Initial troponin 0.07. ED ECG pending. Pharmacy consulted to dose UFH for ACS. No PTA OAC noted.   Goal of Therapy:  Heparin level 0.3-0.7 units/ml Monitor platelets by anticoagulation protocol: Yes   Plan:  Give 3400 units bolus  x 1 Start heparin infusion at 700 units/hr Check anti-Xa level in 6 hours and daily while on heparin Continue to monitor H&H and platelets   11/4 19:09 HL subtherapeutic x 1. 800 units IV x 1 bolus and increase rate to 800 units/hr. Will recheck HL in 6 hours.  11/5 0200 heparin level 0.27. 900 unit bolus and increase rate to 900 units/hr. Recheck in 6 hours.  Zurisadai Helminiak S, Pharm.D., BCPS Clinical Pharmacist 08/06/2017,5:00 AM

## 2017-08-06 NOTE — Progress Notes (Signed)
[place] at [place] NAME: Kirsten Castro    MR#:  [id number]  DATE OF BIRTH:  [birth year]  SUBJECTIVE:  CHIEF COMPLAINT:   Chief Complaint  Patient presents with  . Chest Pain   Still has midsternal chest pain.  REVIEW OF SYSTEMS:    Review of Systems  Constitutional: Positive for malaise/fatigue. Negative for chills and fever.  HENT: Negative for sore throat.   Eyes: Negative for blurred vision, double vision and pain.  Respiratory: Negative for cough, hemoptysis, shortness of breath and wheezing.   Cardiovascular: Positive for chest pain. Negative for palpitations, orthopnea and leg swelling.  Gastrointestinal: Negative for abdominal pain, constipation, diarrhea, heartburn, nausea and vomiting.  Genitourinary: Negative for dysuria and hematuria.  Musculoskeletal: Negative for back pain and joint pain.  Skin: Negative for rash.  Neurological: Positive for weakness. Negative for sensory change, speech change, focal weakness and headaches.  Endo/Heme/Allergies: Does not bruise/bleed easily.  Psychiatric/Behavioral: Negative for depression. The patient is not nervous/anxious.     DRUG ALLERGIES:   Allergies  Allergen Reactions  . Ferrous Sulfate Hives  . Sulfa Antibiotics     VITALS:  Blood pressure 139/64, pulse 62, temperature 98.1 F (36.7 C), resp. rate (!) 21, height 5\' 1"  (1.549 m), weight 55 kg (121 lb 4.1 oz), SpO2 96 %.  PHYSICAL EXAMINATION:   Physical Exam  GENERAL:  56 y.o.-year-old patient lying in the bed with no acute distress.  EYES: Pupils equal, round, reactive to light and accommodation. No scleral icterus. Extraocular muscles intact.  HEENT: Head atraumatic, normocephalic. Oropharynx and nasopharynx clear.  NECK:  Supple, no jugular venous distention. No thyroid enlargement, no tenderness.  LUNGS: Normal breath sounds bilaterally, no wheezing, rales, rhonchi. No use of accessory muscles of respiration.   CARDIOVASCULAR: S1, S2 normal. No murmurs, rubs, or gallops.  ABDOMEN: Soft, nontender, nondistended. Bowel sounds present. No organomegaly or mass.  EXTREMITIES: No cyanosis, clubbing or edema b/l.    NEUROLOGIC: Cranial nerves II through XII are intact. No focal Motor or sensory deficits b/l.   PSYCHIATRIC: The patient is alert and oriented x 3.  SKIN: No obvious rash, lesion, or ulcer.   LABORATORY PANEL:   CBC Recent Labs  Lab 08/06/17 0206  WBC 4.8  HGB 12.4  HCT 36.7  PLT 199   ------------------------------------------------------------------------------------------------------------------ Chemistries  Recent Labs  Lab 08/06/17 0206  NA 139  K 3.4*  CL 106  CO2 26  GLUCOSE 108*  BUN 14  CREATININE 0.78  CALCIUM 8.1*  MG 1.4*  AST 23  ALT 13*  ALKPHOS 42  BILITOT 1.8*   ------------------------------------------------------------------------------------------------------------------  Cardiac Enzymes Recent Labs  Lab 08/06/17 0206  TROPONINI 0.04*   ------------------------------------------------------------------------------------------------------------------  RADIOLOGY:  Dg Chest 2 View  Result Date: 08/05/2017 CLINICAL DATA:  Chest pain. Short of breath. Symptoms since last night. EXAM: CHEST  2 VIEW COMPARISON:  10/02/2016 FINDINGS: The heart is moderately enlarged. Vascularity is within normal limits. There is a stent in the left subclavian artery. Status post CABG and coronary stents. Clear lungs. No pneumothorax or pleural effusion. T12 compression deformity with its bone cement is stable. IMPRESSION: No active cardiopulmonary disease. Electronically Signed   By: Marybelle Killings M.D.   On: 08/05/2017 12:30   ASSESSMENT AND PLAN:   *Stable angina.  Troponin normal.  Due to patient's symptoms patient will be taken for cardiac catheterization today.  Continue aspirin, Plavix, Coreg.  Lipitor.  *COPD.  Stable.  No wheezing  today.  *Hypertension.   Continue home medications.  *DVT prophylaxis.  On heparin drip.  All the records are reviewed and case discussed with Care Management/Social Workerr. Management plans discussed with the patient, family and they are in agreement.  CODE STATUS: FULL CODE  DVT Prophylaxis: SCDs  TOTAL TIME TAKING CARE OF THIS PATIENT: 35 minutes.   POSSIBLE D/C IN 1-2 DAYS, DEPENDING ON CLINICAL CONDITION.  Hillary Bow R M.D on 08/06/2017 at 11:05 AM  Between 7am to 6pm - Pager - [phone]  After 6pm go to [email or web address] - password [email or web address] [facility]  [facility]  Office  [phone]  CC: Primary care physician; Leonel Ramsay, MD  Note: This dictation was prepared with Dragon dictation along with smaller phrase technology. Any transcriptional errors that result from this process are unintentional.

## 2017-08-06 NOTE — Progress Notes (Signed)
Patient clinically stable post heart cath per Dr Nehemiah Massed, denies complaints, husband at bedside, Dr Nehemiah Massed out to speak with husband and patient with questions answered. Will plan to treat medically. Vitals stable, no bleeding nor hematoma at right groin site. sr per monitor. Report called to [place] with plan reviewed. Questions answered.

## 2017-08-06 NOTE — Progress Notes (Signed)
MEDICATION RELATED CONSULT NOTE - ELECTROLYTES  Pharmacy Consult for Electrolytes  Indication: hypokalemia, hypomagnesemia   Allergies  Allergen Reactions  . Ferrous Sulfate Hives  . Sulfa Antibiotics     Patient Measurements: Height: 5\' 1"  (154.9 cm) Weight: 121 lb 4.1 oz (55 kg) IBW/kg (Calculated) : 47.8  Labs: Sodium (mmol/L)  Date Value  08/06/2017 139  12/30/2013 137   Potassium (mmol/L)  Date Value  08/06/2017 3.4 (L)  12/30/2013 3.1 (L)   Calcium (mg/dL)  Date Value  08/06/2017 8.1 (L)   Calcium, Total (mg/dL)  Date Value  12/30/2013 9.1   Albumin (g/dL)  Date Value  08/06/2017 3.2 (L)  12/30/2013 3.6   Magnesium (mg/dL)  Date Value  08/06/2017 1.4 (L)   Estimated Creatinine Clearance: 60.7 mL/min (by C-G formula based on SCr of 0.78 mg/dL).  Goal of Therapy:  K = 3.5 - 5 Mg = 1.7 - 2.4  Plan:  Will replace with magnesium 4g IV once and potassium chloride 61mEq oral packet once. Patient was just taken off of normal saline with KCl 54mEq/L infusion. Will recheck potassium and magnesium with AM labs. Pharmacy will continue to monitor and adjust as needed.   Lendon Ka, PharmD Pharmacy Resident 08/06/2017,11:10 AM

## 2017-08-06 NOTE — Progress Notes (Signed)
Discussed wit Nehemiah Massed multiple IVF /bicarb orders for [facility], orders adjusted per telephone order.

## 2017-08-06 NOTE — Progress Notes (Signed)
1215 Down to Specials via bed  For cath.

## 2017-08-06 NOTE — Progress Notes (Signed)
Admitted for NSTEMI  Plan for cardiac cath today  Follow up Cardiology recs    Corrin Parker, M.D.  [facility]  Medical Director [place] Director [facility] Cardio-Pulmonary Department

## 2017-08-06 NOTE — Progress Notes (Signed)
ANTICOAGULATION CONSULT NOTE - Initial Consult  Pharmacy Consult for heparin Indication: chest pain/ACS  Allergies  Allergen Reactions  . Ferrous Sulfate Hives  . Sulfa Antibiotics     Patient Measurements: Height: 5\' 1"  (154.9 cm) Weight: 121 lb 4.1 oz (55 kg) IBW/kg (Calculated) : 47.8 Heparin Dosing Weight: 56.7 kg  Vital Signs: Temp: 98.1 F (36.7 C) (11/05 0800) Temp Source: Oral (11/05 1115) BP: 140/71 (11/05 1100) Pulse Rate: 61 (11/05 1115)  Labs: Recent Labs    08/05/17 1138 08/05/17 1909 08/05/17 2248 08/06/17 0206 08/06/17 1052  HGB 15.0  --   --  12.4  --   HCT 43.9  --   --  36.7  --   PLT 238  --   --  199  --   APTT 28  --   --   --   --   LABPROT 14.3  --   --   --   --   INR 1.12  --   --   --   --   HEPARINUNFRC  --  0.22*  --  0.27* 0.61  CREATININE 0.79  --  0.76 0.78  --   TROPONINI 0.07* 0.06*  --  0.04*  --     Estimated Creatinine Clearance: 60.7 mL/min (by C-G formula based on SCr of 0.78 mg/dL).   Medical History: Past Medical History:  Diagnosis Date  . Anemia   . Anginal pain ([place])   . Anxiety   . 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  This was done at [facility]  . BP (high blood pressure) 02/03/2016  . Cancer ([place])    melanoma skin cancer  . Carotid stenosis 02/10/2015  . Chronic kidney disease    UTI  . Colitis 12/15/2015  . Collagen vascular disease ([place])   . COPD (chronic obstructive pulmonary disease) ([place])   . Coronary artery disease   . Depression   . Esophageal candidiasis ([place])   . Gastritis   . GERD (gastroesophageal reflux  disease)   . GI bleed 12/15/2015  . Headache   . Hypertension   . Ischemic colitis ([place]) 01/01/2016   Overview:   SMA mesenteric ischemia now status post angioplasty by vascular of In stent restenosis.   Overview:  S?P SMA angioplasty   . Myocardial infarction ([place])   . Nausea and vomiting in adult   . Noninfectious diarrhea   . Peripheral vascular disease ([place]) 02/03/2016   Overview:  Follows with Dr. Delana Meyer    . Shortness of breath dyspnea    with exertion  . Stroke [facility])    TIA X 2  . Temporary cerebral vascular dysfunction 02/03/2016    Medications:  Infusions:  . sodium chloride    . heparin 900 Units/hr (08/06/17 0522)  . magnesium sulfate 1 - 4 g bolus IVPB 4 g (08/06/17 1016)    Assessment:  56 yof cc CP/SOB. PMH CAD, MI, angina, HTN, stroke, PVD, COPD, depression, anxiety, CKD, GERD, headaches, anemia, colitis, GIB, N/V, collagen vascular disease. Initial troponin 0.07. ED ECG pending. Pharmacy consulted to  dose UFH for ACS. No PTA OAC noted.   Goal of Therapy:  Heparin level 0.3-0.7 units/ml Monitor platelets by anticoagulation protocol: Yes   Plan:  Give 3400 units bolus x 1 Start heparin infusion at 700 units/hr Check anti-Xa level in 6 hours and daily while on heparin Continue to monitor H&H and platelets   11/4 19:09 HL subtherapeutic x 1. 800 units IV x 1 bolus and increase rate to 800 units/hr. Will recheck HL in 6 hours.  11/5 0200 heparin level 0.27. 900 unit bolus and increase rate to 900 units/hr. Recheck in 6 hours.  11/6 1100 heparin level 0.61. Will continue heparin at current rate of 900 units/hr. Will recheck HL with AM labs.   Lendon Ka, PharmD Pharmacy Resident 08/06/2017,11:41 AM

## 2017-08-07 LAB — BASIC METABOLIC PANEL
ANION GAP: 5 (ref 5–15)
BUN: 17 mg/dL (ref 6–20)
CALCIUM: 8.4 mg/dL — AB (ref 8.9–10.3)
CO2: 24 mmol/L (ref 22–32)
Chloride: 109 mmol/L (ref 101–111)
Creatinine, Ser: 0.92 mg/dL (ref 0.44–1.00)
GFR calc Af Amer: >60 mL/min (ref >60)
GFR calc non Af Amer: >60 mL/min (ref >60)
GLUCOSE: 118 mg/dL — AB (ref 65–99)
POTASSIUM: 3.9 mmol/L (ref 3.5–5.1)
Sodium: 138 mmol/L (ref 135–145)

## 2017-08-07 LAB — TROPONIN I: Troponin I: <0.03 ng/mL (ref <0.03)

## 2017-08-07 LAB — MAGNESIUM: MAGNESIUM: 1.9 mg/dL (ref 1.7–2.4)

## 2017-08-07 MED ORDER — ISOSORBIDE MONONITRATE ER 30 MG PO TB24: 30.0000 mg | ORAL_TABLET | Freq: Every day | ORAL | 0 refills | Status: AC

## 2017-08-07 NOTE — Discharge Instructions (Signed)
Resume diet and activity as before ° ° °

## 2017-08-07 NOTE — Progress Notes (Signed)
MEDICATION RELATED CONSULT NOTE - ELECTROLYTES  Pharmacy Consult for Electrolytes  Indication: hypokalemia, hypomagnesemia   Allergies  Allergen Reactions  . Ferrous Sulfate Hives  . Sulfa Antibiotics     Patient Measurements: Height: 5\' 1"  (154.9 cm) Weight: 125 lb 7.1 oz (56.9 kg) IBW/kg (Calculated) : 47.8  Labs: Sodium (mmol/L)  Date Value  08/07/2017 138  12/30/2013 137   Potassium (mmol/L)  Date Value  08/07/2017 3.9  12/30/2013 3.1 (L)   Calcium (mg/dL)  Date Value  08/07/2017 8.4 (L)   Calcium, Total (mg/dL)  Date Value  12/30/2013 9.1   Albumin (g/dL)  Date Value  08/06/2017 3.2 (L)  12/30/2013 3.6   Magnesium (mg/dL)  Date Value  08/07/2017 1.9   Estimated Creatinine Clearance: 52.8 mL/min (by C-G formula based on SCr of 0.92 mg/dL).  Goal of Therapy:  K = 3.5 - 5 Mg = 1.7 - 2.4  Plan:  11/5 Will replace with magnesium 4g IV once and potassium chloride 6mEq oral packet once. Patient was just taken off of normal saline with KCl 35mEq/L infusion. Will recheck potassium and magnesium with AM labs. Pharmacy will continue to monitor and adjust as needed.   11/6 Electrolytes within normal limits. Pharmacy will continue to monitor and adjust as needed.   Lendon Ka, PharmD Pharmacy Resident 08/07/2017,11:19 AM

## 2017-08-07 NOTE — Progress Notes (Signed)
1250 Discharged home with husband. Discharge teaching done. Questions answered.

## 2017-08-08 NOTE — Discharge Summary (Signed)
[place] at [place] NAME: Kirsten Castro    MR#:  [id number]  DATE OF BIRTH:  12-Apr-1961  DATE OF ADMISSION:  08/05/2017 ADMITTING PHYSICIAN: Idelle Crouch, MD  DATE OF DISCHARGE: 08/07/2017 11:00 AM  PRIMARY CARE PHYSICIAN: Leonel Ramsay, MD   ADMISSION DIAGNOSIS:  Elevated troponin I level [R74.8] Chest pain, unspecified type [R07.9]  DISCHARGE DIAGNOSIS:  Principal Problem:   Unstable angina ([place]) Active Problems:   CAD (coronary artery disease)   Hypokalemia   Elevated troponin   SECONDARY DIAGNOSIS:   Past Medical History:  Diagnosis Date  . Anemia   . Anginal pain ([place])   . Anxiety   . 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  This was done at [facility]  . BP (high blood pressure) 02/03/2016  . Cancer ([place])    melanoma skin cancer  . Carotid stenosis 02/10/2015  . Chronic kidney disease    UTI  . Colitis 12/15/2015  . Collagen vascular disease ([place])   . COPD (chronic obstructive pulmonary disease) ([place])   . Coronary artery disease   . Depression   . Esophageal candidiasis ([place])   . Gastritis   . GERD (gastroesophageal reflux disease)   . GI bleed 12/15/2015  . Headache   . Hypertension   . Ischemic colitis ([place]) 01/01/2016   Overview:   SMA mesenteric ischemia now status post angioplasty by vascular of In stent restenosis.   Overview:  S?P SMA angioplasty   . Myocardial infarction ([place])   . Nausea and vomiting in adult   . Noninfectious diarrhea   . Peripheral vascular disease ([place]) 02/03/2016   Overview:  Follows with Dr.  Delana Meyer    . Shortness of breath dyspnea    with exertion  . Stroke [facility])    TIA X 2  . Temporary cerebral vascular dysfunction 02/03/2016     ADMITTING HISTORY  HPI: Kirsten Castro is a 56 y.o. 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 In ER, pt continues to c/o CP and SOB despite treatment. EKG shows some ischemic changes and troponin elevated. She is now admitted. Pain is worse with palpation. Has chronci chest wall pain from osteomyelitis. No fever. No N/V/D.    HOSPITAL COURSE:   *Stable angina.  Troponin normal.  Due to patient's symptoms patient had cardiac catheterization done which showed chronic occlusions.  I discussed the case with Dr. Nehemiah Massed who did not think these were the culprit lesions.  Symptoms of chest pain which are chronic were thought to be likely due to small vessel disease which are not amenable to intervention.  Patient was started on M [place] in addition to her appropriate cardiac medications of aspirin, Plavix and beta-blocker with statin. Patient by the day of discharge has ambulated without any pain.  Case was discussed with her and her husband.  She will be discharged home to follow-up with her cardiologist Dr. Ubaldo Glassing in 1 week.  *COPD.  Stable.  No  wheezing today.  *Hypertension.  Continue home medications.  Other comorbidities remained stable.  Stable for discharge home.    CONSULTS OBTAINED:  Treatment Team:  Corey Skains, MD  DRUG ALLERGIES:   Allergies  Allergen Reactions  . Ferrous Sulfate Hives  . Sulfa Antibiotics     DISCHARGE MEDICATIONS:   Discharge Medication List as of 08/07/2017 12:12 PM    START taking these medications   Details  isosorbide mononitrate (IMDUR) 30 MG 24 hr tablet Take 1 tablet (30 mg total) daily by mouth., Starting Tue 08/07/2017, Until Thu 09/06/2017, Normal      CONTINUE these medications which have NOT CHANGED    Details  ALPRAZolam (XANAX) 1 MG tablet TAKE 1 TABLET BY MOUTH TWO TO THREE TIMES A DAY, Historical Med    aspirin EC 81 MG tablet Take by mouth., Historical Med    atorvastatin (LIPITOR) 80 MG tablet Take 80 mg by mouth daily., Historical Med    carvedilol (COREG) 12.5 MG tablet TAKE 1 TABLET BY MOUTH TWICE DAILY WITH MEALS, Historical Med    clopidogrel (PLAVIX) 75 MG tablet Take 75 mg by mouth daily. , Starting Wed 02/02/2016, Historical Med    cyanocobalamin 1000 MCG tablet Take 1 tablet (1,000 mcg total) by mouth daily., Starting Fri 10/20/2016, Print    furosemide (LASIX) 20 MG tablet Take 20 mg by mouth., Historical Med    lisinopril (ZESTRIL) 10 MG tablet Take 1 tablet (10 mg total) by mouth daily., Starting Tue 01/25/2016, Print    Magnesium 250 MG TABS Take 250 mg by mouth 2 (two) times daily., Historical Med    potassium chloride (MICRO-K) 10 MEQ CR capsule Take 10 mEq by mouth 2 (two) times daily. , Historical Med    acetaminophen (TYLENOL) 500 MG tablet Take 1,000 mg by mouth every 6 (six) hours as needed for mild pain or headache., Historical Med    clotrimazole (LOTRIMIN) 1 % cream APP EXT AA BID, Historical Med    divalproex (DEPAKOTE) 500 MG DR tablet Take 1 tablet (500 mg total) by mouth every 12 (twelve) hours., Starting Fri 10/20/2016, Print    Fluticasone-Salmeterol (ADVAIR DISKUS) 250-50 MCG/DOSE AEPB Inhale 1 puff into the lungs 2 (two) times daily. , Historical Med    fluvoxaMINE (LUVOX) 100 MG tablet Take 2 tablets (200 mg total) by mouth at bedtime., Starting Fri 10/20/2016, Print    haloperidol (HALDOL) 10 MG tablet Take 1 tablet (10 mg total) by mouth at bedtime., Starting Fri 10/20/2016, Print    hydrocortisone (ANUSOL-HC) 2.5 % rectal cream Apply topically., Historical Med    mometasone-formoterol (DULERA) 200-5 MCG/ACT AERO Inhale 2 puffs into the lungs 2 (two) times daily., Starting Fri 10/20/2016, Print    nitroGLYCERIN (NITROSTAT) 0.4 MG SL tablet Place  1 tablet (0.4 mg total) under the tongue every 5 (five) minutes as needed for chest pain., Starting Fri 10/20/2016, Print    pantoprazole (PROTONIX) 40 MG tablet Take 1 tablet (40 mg total) by mouth 2 (two) times daily., Starting Tue 01/25/2016, Print    polyethylene glycol powder (GLYCOLAX/MIRALAX) powder Starting Fri 01/09/2014, Historical Med    senna-docusate (SENOKOT-S) 8.6-50 MG tablet Take 1 tablet by mouth at bedtime as needed for mild constipation., Starting Sat 03/11/2016, OTC    traMADol (ULTRAM) 50 MG tablet Take 1 tablet (50 mg total) by mouth every 6 (six) hours as needed., Starting Tue 03/20/2017, Until Wed 03/20/2018, Print    traZODone (DESYREL) 100 MG tablet Take 1 tablet (100 mg  total) by mouth at bedtime., Starting Fri 10/20/2016, Print      STOP taking these medications     cetirizine (ZYRTEC) 10 MG tablet      tamsulosin (FLOMAX) 0.4 MG CAPS capsule         Today   VITAL SIGNS:  Blood pressure 100/64, pulse (!) 54, temperature 98.4 F (36.9 C), resp. rate (!) 25, height 5\' 1"  (1.549 m), weight 56.9 kg (125 lb 7.1 oz), SpO2 96 %.  I/O:  No intake or output data in the 24 hours ending 08/08/17 1657  PHYSICAL EXAMINATION:  Physical Exam  GENERAL:  56 y.o.-year-old patient lying in the bed with no acute distress.  LUNGS: Normal breath sounds bilaterally, no wheezing, rales,rhonchi or crepitation. No use of accessory muscles of respiration.  CARDIOVASCULAR: S1, S2 normal. No murmurs, rubs, or gallops.  ABDOMEN: Soft, non-tender, non-distended. Bowel sounds present. No organomegaly or mass.  NEUROLOGIC: Moves all 4 extremities. PSYCHIATRIC: The patient is alert and oriented x 3.  SKIN: No obvious rash, lesion, or ulcer.   DATA REVIEW:   CBC Recent Labs  Lab 08/06/17 0206  WBC 4.8  HGB 12.4  HCT 36.7  PLT 199    Chemistries  Recent Labs  Lab 08/06/17 0206 08/07/17 0203  NA 139 138  K 3.4* 3.9  CL 106 109  CO2 26 24  GLUCOSE 108* 118*  BUN 14 17   CREATININE 0.78 0.92  CALCIUM 8.1* 8.4*  MG 1.4* 1.9  AST 23  --   ALT 13*  --   ALKPHOS 42  --   BILITOT 1.8*  --     Cardiac Enzymes Recent Labs  Lab 08/07/17 0203  TROPONINI <0.03    Microbiology Results  Results for orders placed or performed during the hospital encounter of 08/05/17  MRSA PCR Screening     Status: None   Collection Time: 08/05/17  2:03 PM  Result Value Ref Range Status   MRSA by PCR NEGATIVE NEGATIVE Final    Comment:        The GeneXpert MRSA Assay (FDA approved for NASAL specimens only), is one component of a comprehensive MRSA colonization surveillance program. It is not intended to diagnose MRSA infection nor to guide or monitor treatment for MRSA infections.     RADIOLOGY:  No results found.  Follow up with PCP in 1 week.  Management plans discussed with the patient, family and they are in agreement.  CODE STATUS:  Code Status History    Date Active Date Inactive Code Status Order ID Comments User Context   08/05/2017 13:22 08/07/2017 13:01 Full Code [id number]  Idelle Crouch, MD ED   10/02/2016 21:33 10/20/2016 15:47 Full Code [id number]  Gonzella Lex, MD Inpatient   04/19/2016 02:33 04/20/2016 16:22 Full Code [id number]  Harrie Foreman, MD Inpatient   03/28/2016 14:56 03/28/2016 18:56 Full Code [id number]  Hessie Knows, MD Inpatient   03/09/2016 23:15 03/11/2016 20:17 Full Code [id number]  Quintella Baton, MD Inpatient   01/21/2016 03:11 01/25/2016 14:59 Full Code [id number]  Harrie Foreman, MD Inpatient   12/15/2015 22:44 12/18/2015 19:56 Full Code [id number]  Vaughan Basta, MD Inpatient   02/10/2015 12:02 02/12/2015 19:12 Full Code [id number]  Algernon Huxley, MD Inpatient      TOTAL TIME TAKING CARE OF THIS PATIENT ON DAY OF DISCHARGE: more than 30 minutes.   Hillary Bow R M.D on 08/08/2017 at 4:57 PM  Between 7am to 6pm - Pager - [phone]  After 6pm go to [email or web address] - password [email or web address] [facility]  [facility] [place] [facility]   Office  [phone]  CC: Primary care physician; Leonel Ramsay, MD  Note: This dictation was prepared with Dragon dictation along with smaller phrase technology. Any transcriptional errors that result from this process are unintentional.

## 2017-08-21 ENCOUNTER — Encounter: Payer: Self-pay | Admitting: Emergency Medicine

## 2017-08-21 ENCOUNTER — Emergency Department: Payer: BLUE CROSS/BLUE SHIELD

## 2017-08-21 ENCOUNTER — Emergency Department
Admission: EM | Admit: 2017-08-21 | Discharge: 2017-08-21 | Disposition: A | Discharge status: Home / Self Care | Payer: BLUE CROSS/BLUE SHIELD | Attending: Emergency Medicine | Admitting: Emergency Medicine

## 2017-08-21 DIAGNOSIS — I129 Hypertensive chronic kidney disease with stage 1 through stage 4 chronic kidney disease, or unspecified chronic kidney disease: Secondary | ICD-10-CM | POA: Diagnosis not present

## 2017-08-21 DIAGNOSIS — N189 Chronic kidney disease, unspecified: Secondary | ICD-10-CM | POA: Diagnosis not present

## 2017-08-21 DIAGNOSIS — R0602 Shortness of breath: Secondary | ICD-10-CM | POA: Insufficient documentation

## 2017-08-21 DIAGNOSIS — R079 Chest pain, unspecified: Secondary | ICD-10-CM | POA: Diagnosis present

## 2017-08-21 DIAGNOSIS — Z87891 Personal history of nicotine dependence: Secondary | ICD-10-CM | POA: Diagnosis not present

## 2017-08-21 DIAGNOSIS — I2510 Atherosclerotic heart disease of native coronary artery without angina pectoris: Secondary | ICD-10-CM | POA: Diagnosis not present

## 2017-08-21 DIAGNOSIS — Z7902 Long term (current) use of antithrombotics/antiplatelets: Secondary | ICD-10-CM | POA: Diagnosis not present

## 2017-08-21 DIAGNOSIS — I252 Old myocardial infarction: Secondary | ICD-10-CM | POA: Diagnosis not present

## 2017-08-21 DIAGNOSIS — Z79899 Other long term (current) drug therapy: Secondary | ICD-10-CM | POA: Diagnosis not present

## 2017-08-21 DIAGNOSIS — Z951 Presence of aortocoronary bypass graft: Secondary | ICD-10-CM | POA: Insufficient documentation

## 2017-08-21 DIAGNOSIS — J449 Chronic obstructive pulmonary disease, unspecified: Secondary | ICD-10-CM | POA: Insufficient documentation

## 2017-08-21 DIAGNOSIS — Z7982 Long term (current) use of aspirin: Secondary | ICD-10-CM | POA: Diagnosis not present

## 2017-08-21 DIAGNOSIS — Z8673 Personal history of transient ischemic attack (TIA), and cerebral infarction without residual deficits: Secondary | ICD-10-CM | POA: Insufficient documentation

## 2017-08-21 LAB — BASIC METABOLIC PANEL
Anion gap: 9 (ref 5–15)
BUN: 19 mg/dL (ref 6–20)
CHLORIDE: 106 mmol/L (ref 101–111)
CO2: 25 mmol/L (ref 22–32)
Calcium: 8.9 mg/dL (ref 8.9–10.3)
Creatinine, Ser: 0.75 mg/dL (ref 0.44–1.00)
GFR calc Af Amer: >60 mL/min (ref >60)
GFR calc non Af Amer: >60 mL/min (ref >60)
GLUCOSE: 92 mg/dL (ref 65–99)
POTASSIUM: 3.6 mmol/L (ref 3.5–5.1)
Sodium: 140 mmol/L (ref 135–145)

## 2017-08-21 LAB — CBC
HEMATOCRIT: 39.2 % (ref 35.0–47.0)
HEMOGLOBIN: 13.4 g/dL (ref 12.0–16.0)
MCH: 33 pg (ref 26.0–34.0)
MCHC: 34.2 g/dL (ref 32.0–36.0)
MCV: 96.5 fL (ref 80.0–100.0)
Platelets: 206 10*3/uL (ref 150–440)
RBC: 4.06 MIL/uL (ref 3.80–5.20)
RDW: 13.5 % (ref 11.5–14.5)
WBC: 7.5 10*3/uL (ref 3.6–11.0)

## 2017-08-21 LAB — BRAIN NATRIURETIC PEPTIDE: B Natriuretic Peptide: 156 pg/mL — ABNORMAL HIGH (ref 0.0–100.0)

## 2017-08-21 LAB — FIBRIN DERIVATIVES D-DIMER (ARMC ONLY): Fibrin derivatives D-dimer (ARMC): 869.34 ng/mL (FEU) — ABNORMAL HIGH (ref 0.00–499.00)

## 2017-08-21 LAB — TROPONIN I: Troponin I: <0.03 ng/mL (ref <0.03)

## 2017-08-21 MED ORDER — IOPAMIDOL (ISOVUE-370) INJECTION 76%
75.0000 mL | Freq: Once | INTRAVENOUS | Status: AC | PRN
  Administered 2017-08-21: 75 mL via INTRAVENOUS

## 2017-08-21 MED ORDER — OXYCODONE-ACETAMINOPHEN 5-325 MG PO TABS: 1.0000 | ORAL_TABLET | Freq: Four times a day (QID) | ORAL | 0 refills | Status: AC | PRN

## 2017-08-21 MED ORDER — NITROGLYCERIN 2 % TD OINT
1.0000 [in_us] | TOPICAL_OINTMENT | Freq: Once | TRANSDERMAL | Status: AC
  Administered 2017-08-21: 1 [in_us] via TOPICAL
  Filled 2017-08-21: qty 1

## 2017-08-21 MED ORDER — IPRATROPIUM-ALBUTEROL 0.5-2.5 (3) MG/3ML IN SOLN
3.0000 mL | Freq: Once | RESPIRATORY_TRACT | Status: AC
  Administered 2017-08-21: 3 mL via RESPIRATORY_TRACT
  Filled 2017-08-21: qty 3

## 2017-08-21 MED ORDER — MORPHINE SULFATE (PF) 4 MG/ML IV SOLN
4.0000 mg | Freq: Once | INTRAVENOUS | Status: AC
  Administered 2017-08-21: 4 mg via INTRAVENOUS
  Filled 2017-08-21: qty 1

## 2017-08-21 MED ORDER — GI COCKTAIL ~~LOC~~
30.0000 mL | Freq: Once | ORAL | Status: DC
  Filled 2017-08-21: qty 30

## 2017-08-21 MED ORDER — LORAZEPAM 0.5 MG PO TABS
0.5000 mg | ORAL_TABLET | Freq: Once | ORAL | Status: AC
  Administered 2017-08-21: 0.5 mg via ORAL
  Filled 2017-08-21: qty 1

## 2017-08-21 NOTE — ED Notes (Signed)
Explained meds.  Patient says she will not take the gi cocktail.

## 2017-08-21 NOTE — ED Notes (Signed)
Lab contacted regarding d-dimer, lab states 10-15 mins for results

## 2017-08-21 NOTE — Discharge Instructions (Signed)
Please follow-up with Dr. Ubaldo Glassing in the office.

## 2017-08-21 NOTE — ED Notes (Signed)
Pt states "the pain gets worse when I yawn, its almost so bad it makes me want to cry"  Pt covering eyes and grimacing when yawning

## 2017-08-21 NOTE — ED Triage Notes (Signed)
Pt arrived via ems from home with complaints of recurrent midsternum chest pain that has persisted over the last 2 weeks. Pt was given 2 sprays of nitroglycerin and 324 mg of aspirin in route. Pt's initial blood pressure for ems was 198/110 and upon arrival ems reported a blood pressure of 160/96 Pt alert and oriented x 4.

## 2017-08-21 NOTE — ED Provider Notes (Signed)
[facility] Emergency Department Provider Note   ____________________________________________   First MD Initiated Contact with Patient 08/21/17 0159     (approximate)  I have reviewed the triage vital signs and the nursing notes.   HISTORY  Chief Complaint Chest Pain   HPI Kirsten Castro is a 56 y.o. female who comes into the hospital today with some chest pain.  The patient was seen and admitted to the hospital on November 4.  The patient received a cardiac catheterization but was discharged with medical management.  She states that this chest pain has been on and off since then but tonight it was just as bad as it was 2 weeks ago.  She states that it started around midnight.  She was laying in bed when it started.  She has pain in her mid chest and she feels like she is choking.  The patient endorses some shortness of breath and she states that she vomited last night.  The patient took aspirin before coming into the hospital.  She took 162 mg and EMS gave her a second 162 mg.  The patient was started on Imdur and has been taking it but it has not helped yet.  The patient did not take any nitroglycerin at home.  She has had some dizziness and her pain is currently a 10 out of 10 in intensity.  The patient states that when she yawns and takes a deep breath the pain is worse.  The patient has 270% blockages in 100% blockage.  She has an appointment with Dr. Ubaldo Glassing on the 27th.  She is here for evaluation.   Past Medical History:  Diagnosis Date  . Anemia   . Anginal pain ([place])   . Anxiety   . 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  This was done at [facility]  . BP (high blood pressure) 02/03/2016  . Cancer ([place])    melanoma skin cancer  . Carotid stenosis 02/10/2015  . Chronic kidney disease    UTI  . Colitis 12/15/2015  . Collagen vascular disease ([place])   . COPD (chronic obstructive pulmonary disease) ([place])   . Coronary artery disease   . Depression   . Esophageal candidiasis ([place])   . Gastritis   . GERD (gastroesophageal reflux disease)   . GI bleed 12/15/2015  . Headache   . Hypertension   . Ischemic colitis ([place]) 01/01/2016   Overview:   SMA mesenteric ischemia now status post angioplasty by vascular of In stent restenosis.   Overview:  S?P SMA angioplasty   . Myocardial infarction ([place])   . Nausea and vomiting in adult   . Noninfectious diarrhea   . Peripheral vascular disease ([place]) 02/03/2016   Overview:  Follows with Dr. Delana Meyer    . Shortness of breath dyspnea    with exertion  . Stroke [facility])    TIA X 2  . Temporary cerebral vascular dysfunction 02/03/2016    Patient Active Problem List   Diagnosis Date Noted  . Unstable angina ([place]) 08/05/2017  . Hypokalemia 08/05/2017  .  Elevated troponin 08/05/2017  . DVT (deep venous thrombosis) ([place]) 03/05/2017  . Severe recurrent major depression with psychotic features ([place]) 10/02/2016  . Benzodiazepine withdrawal ([place]) 10/02/2016  . Trichotillomania 10/02/2016  . Abdominal pain 04/19/2016  . Sepsis ([place]) 03/09/2016  . Urinary retention 03/09/2016  . CAD (coronary artery disease) 03/09/2016  . Frank hematuria 03/08/2016  . Temporary cerebral vascular dysfunction 02/03/2016  . Peripheral vascular disease ([place]) 02/03/2016  . HTN (hypertension) 02/03/2016  . HLD (hyperlipidemia) 02/03/2016  . Clinical depression 02/03/2016  . Carotid artery narrowing 02/03/2016  . 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 Anxiety 02/03/2016    . Abdominal pain, generalized   . Nausea with vomiting   . Gastritis   . Esophageal candidiasis ([place])   . Noninfectious diarrhea   . Intractable pain 01/21/2016  . Upper abdominal pain   . Nausea and vomiting in adult   . Ischemic colitis ([place]) 01/01/2016  . Colitis 12/15/2015  . GI bleed 12/15/2015  . Acute inflammation of the pancreas 12/06/2015  . Vitamin B12 deficiency anemia 02/25/2015  . Absolute anemia 02/25/2015  . Carotid stenosis 02/10/2015  . Personal history of other specified conditions 11/27/2014  . Osteomyelitis ([place]) 11/27/2014  . H/O coronary artery bypass surgery 05/18/2014    Past Surgical History:  Procedure Laterality Date  . ABDOMINAL HYSTERECTOMY    . BACK SURGERY    . BREAST BIOPSY Left 6 2017   results not in yet  . Carotid PTA/Stent Intervention N/A 02/10/2015   Performed by Katha Cabal, MD at [place] [facility]  . COLONOSCOPY WITH PROPOFOL N/A 01/24/2016   Performed by Lucilla Lame, MD at [place]  . CORONARY ANGIOPLASTY    . CORONARY ARTERY BYPASS GRAFT    . ESOPHAGOGASTRODUODENOSCOPY (EGD) WITH PROPOFOL N/A 01/24/2016   Performed by Lucilla Lame, MD at [place]  . KYPHOPLASTY N/A 03/28/2016   Performed by Hessie Knows, MD at [facility]  . KYPHOPLASTY L1 N/A 02/08/2016   Performed by Hessie Knows, MD at [facility]  . LEFT HEART CATH AND CORS/GRAFTS ANGIOGRAPHY N/A 08/06/2017   Performed by Corey Skains, MD at [place] [facility]  . NOSE SURGERY     cancer removal  . OTHER SURGICAL HISTORY  Jul 11 2014   sternum removal  . STENT PLACEMENT VASCULAR ([place] [facility])    . Visceral Angiography N/A 12/17/2015   Performed by Katha Cabal, MD at [place] [facility]  . Visceral Artery Intervention N/A 12/17/2015   Performed by Katha Cabal, MD at [place] [facility]    Prior to Admission medications   Medication Sig Start Date End Date Taking? Authorizing Provider  acetaminophen (TYLENOL) 500 MG tablet Take 1,000 mg by  mouth every 6 (six) hours as needed for mild pain or headache.   Yes [provider]  ALPRAZolam (XANAX) 1 MG tablet TAKE 1 TABLET BY MOUTH TWO TO THREE TIMES A DAY 01/22/17  Yes [provider]  aspirin EC 81 MG tablet Take 81 mg daily by mouth.    Yes [provider]  carvedilol (COREG) 12.5 MG tablet TAKE 1 TABLET BY MOUTH TWICE DAILY WITH MEALS 12/18/16  Yes [provider]  clopidogrel (PLAVIX) 75 MG tablet Take 75 mg by mouth daily.  02/02/16  Yes [provider]  cyanocobalamin 1000 MCG tablet Take 1 tablet (1,000 mcg total) by mouth daily. 10/20/16  Yes  Pucilowska, Jolanta B, MD  haloperidol (HALDOL) 10 MG tablet Take 1 tablet (10 mg total) by mouth at bedtime. 10/20/16  Yes Pucilowska, Jolanta B, MD  isosorbide mononitrate (IMDUR) 30 MG 24 hr tablet Take 1 tablet (30 mg total) daily by mouth. 08/07/17 09/06/17 Yes Sudini, Alveta Heimlich, MD  lisinopril (ZESTRIL) 10 MG tablet Take 1 tablet (10 mg total) by mouth daily. 01/25/16  Yes Henreitta Leber, MD  Magnesium 250 MG TABS Take 250 mg by mouth 2 (two) times daily.   Yes [provider]  nitroGLYCERIN (NITROSTAT) 0.4 MG SL tablet Place 1 tablet (0.4 mg total) under the tongue every 5 (five) minutes as needed for chest pain. 10/20/16  Yes Pucilowska, Jolanta B, MD  polyethylene glycol powder (GLYCOLAX/MIRALAX) powder Take 17 g daily as needed by mouth for mild constipation.  01/09/14  Yes [provider]  potassium chloride (MICRO-K) 10 MEQ CR capsule Take 10 mEq by mouth 2 (two) times daily.    Yes [provider]  traMADol (ULTRAM) 50 MG tablet Take 1 tablet (50 mg total) by mouth every 6 (six) hours as needed. 03/20/17 03/20/18 Yes Earleen Newport, MD  traZODone (DESYREL) 100 MG tablet Take 1 tablet (100 mg total) by mouth at bedtime. 10/20/16  Yes Pucilowska, Jolanta B, MD  divalproex (DEPAKOTE) 500 MG DR tablet Take 1 tablet (500 mg total) by mouth every 12 (twelve) hours. Patient  not taking: Reported on 08/05/2017 10/20/16   Pucilowska, Herma Ard B, MD  Fluticasone-Salmeterol (ADVAIR DISKUS) 250-50 MCG/DOSE AEPB Inhale 1 puff into the lungs 2 (two) times daily.     [provider]  fluvoxaMINE (LUVOX) 100 MG tablet Take 2 tablets (200 mg total) by mouth at bedtime. Patient not taking: Reported on 08/05/2017 10/20/16   Pucilowska, Wardell Honour, MD  mometasone-formoterol (DULERA) 200-5 MCG/ACT AERO Inhale 2 puffs into the lungs 2 (two) times daily. Patient not taking: Reported on 06/11/2017 10/20/16   Pucilowska, Wardell Honour, MD  oxyCODONE-acetaminophen (ROXICET) 5-325 MG tablet Take 1 tablet every 6 (six) hours as needed by mouth. 08/21/17   Loney Hering, MD  pantoprazole (PROTONIX) 40 MG tablet Take 1 tablet (40 mg total) by mouth 2 (two) times daily. Patient not taking: Reported on 06/11/2017 01/25/16   Henreitta Leber, MD  senna-docusate (SENOKOT-S) 8.6-50 MG tablet Take 1 tablet by mouth at bedtime as needed for mild constipation. Patient not taking: Reported on 06/11/2017 03/11/16   Nicholes Mango, MD    Allergies Ferrous sulfate and Sulfa antibiotics  Family History  Problem Relation Age of Onset  . Breast cancer Sister   . CAD Father   . CAD Brother     Social History Social History   Tobacco Use  . Smoking status: Former Smoker    Packs/day: 1.00    Types: Cigarettes    Last attempt to quit: 08/01/2012    Years since quitting: 5.0  . Smokeless tobacco: Never Used  Substance Use Topics  . Alcohol use: No  . Drug use: No    Review of Systems  Constitutional: No fever/chills Eyes: No visual changes. ENT: No sore throat. Cardiovascular: chest pain. Respiratory:  shortness of breath. Gastrointestinal: Nausea and vomiting with no abdominal pain.  No diarrhea.  No constipation. Genitourinary: Negative for dysuria. Musculoskeletal: Negative for back pain. Skin: Negative for rash. Neurological: Negative for headaches, focal weakness or  numbness.   ____________________________________________   PHYSICAL EXAM:  VITAL SIGNS: ED Triage Vitals  Enc Vitals Group  BP 08/21/17 0200 (!) 156/92     Pulse Rate 08/21/17 0200 78     Resp 08/21/17 0200 (!) 29     Temp 08/21/17 0200 98.7 F (37.1 C)     Temp Source 08/21/17 0200 Oral     SpO2 08/21/17 0200 100 %     Weight 08/21/17 0201 125 lb (56.7 kg)     Height 08/21/17 0201 5\' 1"  (1.549 m)     Head Circumference --      Peak Flow --      Pain Score 08/21/17 0412 8     Pain Loc --      Pain Edu? --      Excl. in [place]? --     Constitutional: Alert and oriented. Well appearing and in moderate distress. Eyes: Conjunctivae are normal. PERRL. EOMI. Head: Atraumatic. Nose: No congestion/rhinnorhea. Mouth/Throat: Mucous membranes are moist.  Oropharynx non-erythematous. Cardiovascular: Normal rate, regular rhythm. Grossly normal heart sounds.  Good peripheral circulation. Respiratory: Tachypnea.  No retractions. Mild expiratory wheezing. Gastrointestinal: Soft and nontender. No distention.  Positive bowel sounds Musculoskeletal: No lower extremity tenderness nor edema.   Neurologic:  Normal speech and language.  Skin:  Skin is warm, dry and intact.  Psychiatric: Mood and affect are normal.   ____________________________________________   LABS (all labs ordered are listed, but only abnormal results are displayed)  Labs Reviewed  BRAIN NATRIURETIC PEPTIDE - Abnormal; Notable for the following components:      Result Value   B Natriuretic Peptide 156.0 (*)    All other components within normal limits  BLOOD GAS, VENOUS - Abnormal; Notable for the following components:   Bicarbonate 30.6 (*)    Acid-Base Excess 4.1 (*)    All other components within normal limits  FIBRIN DERIVATIVES D-DIMER ([facility] ONLY) - Abnormal; Notable for the following components:   Fibrin derivatives D-dimer [facility]) 869.34 (*)    All other components within normal limits  BASIC METABOLIC  PANEL  CBC  TROPONIN I  TROPONIN I   ____________________________________________  EKG  ED ECG REPORT I, Loney Hering, the attending physician, personally viewed and interpreted this ECG.   Date: 08/21/2017  EKG Time: 200  Rate: 76  Rhythm: normal sinus rhythm  Axis: normal  Intervals:none  ST&T Change: none  ____________________________________________  RADIOLOGY  Ct Angio Chest Pe W And/or Wo Contrast  Result Date: 08/21/2017 CLINICAL DATA:  Recurrent midsternal chest pain over the last 2 weeks. Elevated blood pressure. EXAM: CT ANGIOGRAPHY CHEST WITH CONTRAST TECHNIQUE: Multidetector CT imaging of the chest was performed using the standard protocol during bolus administration of intravenous contrast. Multiplanar CT image reconstructions and MIPs were obtained to evaluate the vascular anatomy. CONTRAST:  60mL ISOVUE-370 IOPAMIDOL (ISOVUE-370) INJECTION 76% COMPARISON:  07/21/2016 FINDINGS: Cardiovascular: Good opacification of the central and segmental pulmonary arteries. No focal filling defects. No evidence of significant pulmonary embolus. Normal caliber thoracic aorta with diffuse calcification. Cardiac enlargement. No pericardial effusion. Reflux of contrast material into the IVC and hepatic veins consistent with passive congestion. Postoperative changes consistent with coronary bypass. Stent in the left subclavian artery origin. Mediastinum/Nodes: Esophagus is decompressed. No significant lymphadenopathy in the chest. Lungs/Pleura: Evaluation is limited due to motion artifact. No focal consolidation or airspace disease. No pleural effusions. No pneumothorax. Airways are patent. Upper Abdomen: Passive hepatic congestion. Prominent vascular calcifications. Stent in the origin of the superior mesenteric artery. Musculoskeletal: There has been resection of the sternum with associated scarring and a loculated collection  in the sternal space measuring about 1.2 x 4.4 by 5.8 cm.  This collection is increased since the previous study. This may represent postoperative fluid collection or could indicate chronic abscess. Compression of T12 post kyphoplasty. Review of the MIP images confirms the above findings. IMPRESSION: 1. No evidence of significant pulmonary embolus. 2. Cardiac enlargement with evidence of right heart failure and passive hepatic congestion. 3. Postoperative sternotomy with enlarging fluid collection in the sternal space. This could indicate chronic infection. 4. No evidence of active pulmonary disease. 5. Aortic atherosclerosis with prominent vascular calcifications and previous vascular surgeries/stents. Aortic Atherosclerosis (ICD10-I70.0). Electronically Signed   By: Lucienne Capers M.D.   On: 08/21/2017 06:25   Dg Chest Portable 1 View  Result Date: 08/21/2017 CLINICAL DATA:  56 year old female with chest pain. EXAM: PORTABLE CHEST 1 VIEW COMPARISON:  Chest radiograph dated 08/05/2017 FINDINGS: There is mild cardiomegaly with CABG clips and coronary stents. There is no focal consolidation, pleural effusion, or pneumothorax. Atherosclerotic calcification of the aortic arch. No acute osseous pathology. Lower thoracic vertebroplasty. IMPRESSION: 1. No acute cardiopulmonary process. 2. Cardiomegaly with CABG changes and coronary vascular stents. Electronically Signed   By: Anner Crete M.D.   On: 08/21/2017 02:38    ____________________________________________   PROCEDURES  Procedure(s) performed: None  Procedures  Critical Care performed: No  ____________________________________________   INITIAL IMPRESSION / ASSESSMENT AND PLAN / ED COURSE  As part of my medical decision making, I reviewed the following data within the electronic MEDICAL RECORD NUMBER Notes from prior ED visits and [place] Controlled Substance Database  This is a 56 year old female with a complicated cardiac history who comes into the hospital today with chest pain.  I did place a nitro  paste to her chest and she did receive aspirin prior to coming.  We did check 2 sets of enzymes which were negative.  The patient states that her pain was worse when she took a deep breath so I did send a d-dimer as well as sending her for a CT scan.  The patient CT did not show a pulmonary embolism but the patient does have a small fluid collection in the sternal space.  I contacted Dr. Laverle Patter shows who was covering for the patient's cardiologist who felt that this pain was atypical and not cardiac in nature.  I also spoke to Dr. Faith Rogue who was on for thoracic surgery and he felt that the patient needed to follow back up with her thoracic surgeon but that the collection was small and chronic in appearance so it did not need to be done emergently.  The patient states that she did still have some pain.  I gave her some Percocet and she states that she still does have some pain.  I attempted to contact the hospitalist who felt that the patient would not benefit from admission I discussed this with the patient and encouraged her to follow-up with cardiology.  She will be discharged home to follow-up with cardiology.       ____________________________________________   FINAL CLINICAL IMPRESSION(S) / ED DIAGNOSES  Final diagnoses:  Chest pain, unspecified type     ED Discharge Orders        Ordered    oxyCODONE-acetaminophen (ROXICET) 5-325 MG tablet  Every 6 hours PRN     08/21/17 0842       Note:  This document was prepared using Dragon voice recognition software and may include unintentional dictation errors.    Loney Hering, MD 08/21/17  0846  

## 2017-08-25 LAB — BLOOD GAS, VENOUS
ACID-BASE EXCESS: 4.1 mmol/L — AB (ref 0.0–2.0)
BICARBONATE: 30.6 mmol/L — AB (ref 20.0–28.0)
PATIENT TEMPERATURE: 37
pCO2, Ven: 53 mmHg (ref 44.0–60.0)
pH, Ven: 7.37 (ref 7.250–7.430)

## 2017-11-02 DEATH — deceased

## 2017-12-08 IMAGING — CR DG C-ARM 1-60 MIN-NO REPORT
1 series · 1 of 1 positions shown · non-contrast
Comparison: 03/16/2016

CLINICAL DATA: Lumbar kypho, 1 min 48 sec FT

EXAM:
LUMBAR SPINE - 2-3 VIEW; DG C-ARM 1-60 MIN-NO REPORT

[cont.]
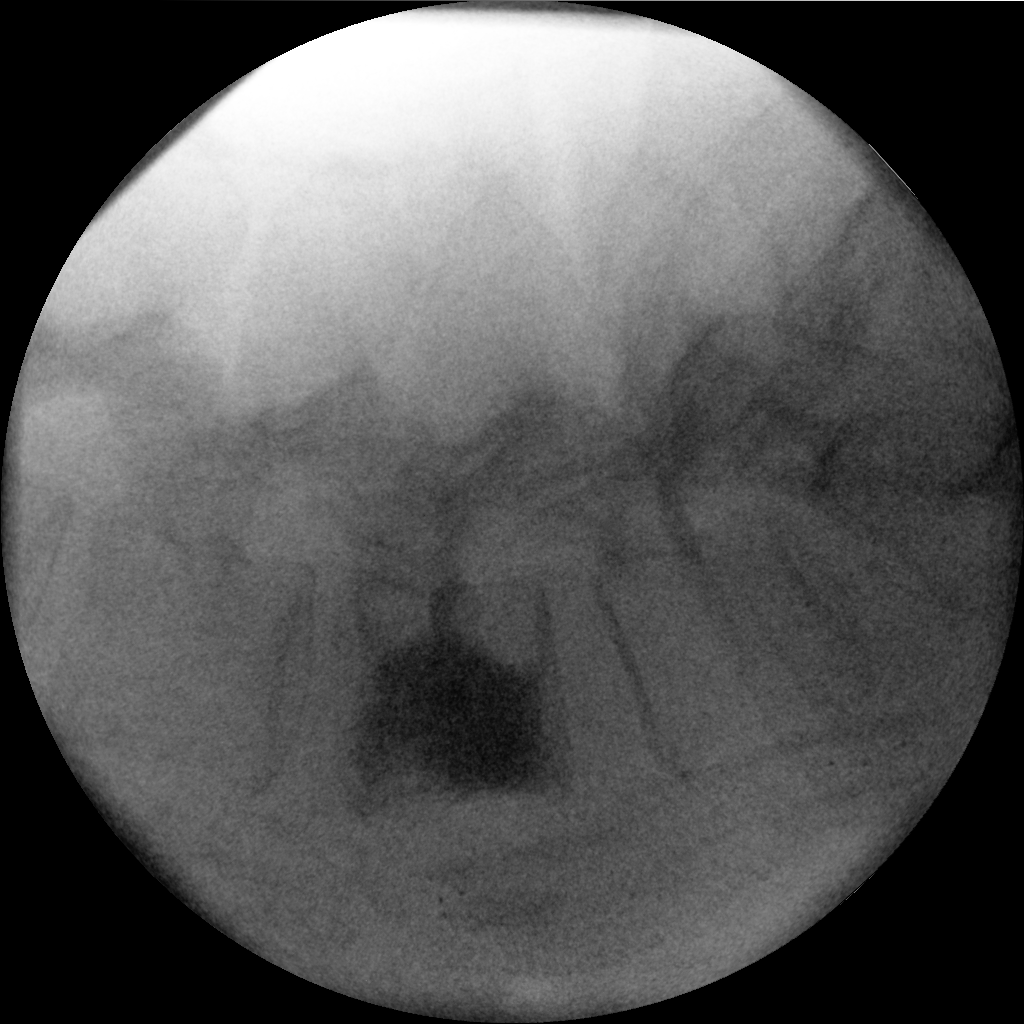

[1 of 1 positions shown; findings below may reference images not displayed]

FINDINGS: Patient has undergone kyphoplasty at L1. The upper portion of SMA
stent overlies the same vertebral level.
IMPRESSION: Status post kyphoplasty at L1.

## 2017-12-30 IMAGING — CT CT ABD-PELV W/ CM
2 of 5 series · 16 of 46 positions shown, 18 images · IV contrast (iopamidol)
Comparison: 03/09/2016, 01/20/2016

CLINICAL DATA: Acute generalized abdominal pain with chest pain.

EXAM:
CT ABDOMEN AND PELVIS WITH CONTRAST
TECHNIQUE: Multidetector CT imaging of the abdomen and pelvis was performed
using the standard protocol following bolus administration of
intravenous contrast.
CONTRAST:  100mL VGXT5B-ILL IOPAMIDOL (VGXT5B-ILL) INJECTION 61%

[Series 2: routine abd pel with · axial · 0.69mm/px · z∈[-994,-600]mm · 13 of 89 slices shown, 15 images]
[im 5/89  soft-tissue]
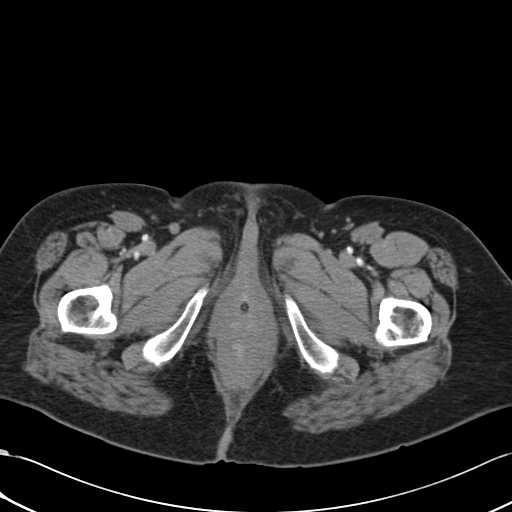
[im 5/89  bone]
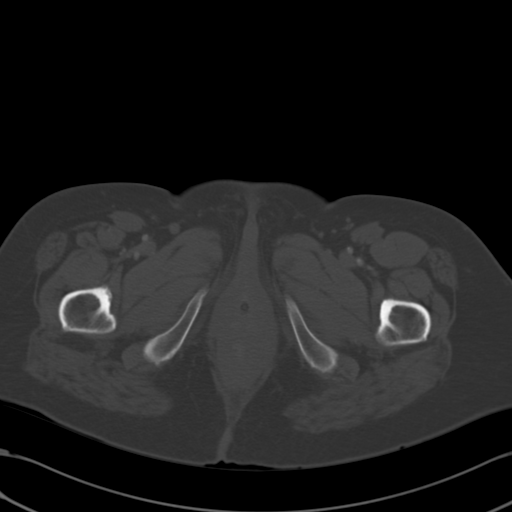
[im 14/89  soft-tissue]
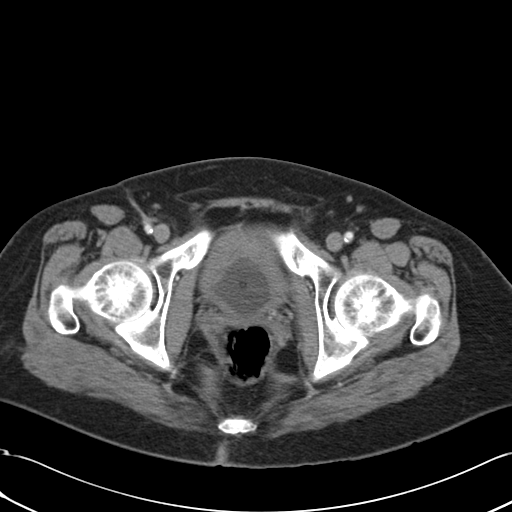
[im 19/89  soft-tissue]
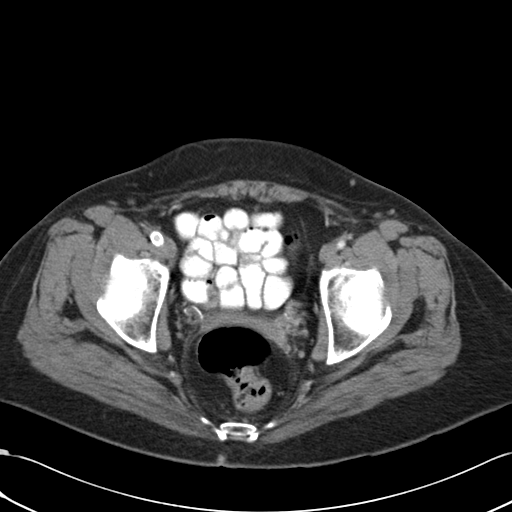
[im 24/89  soft-tissue]
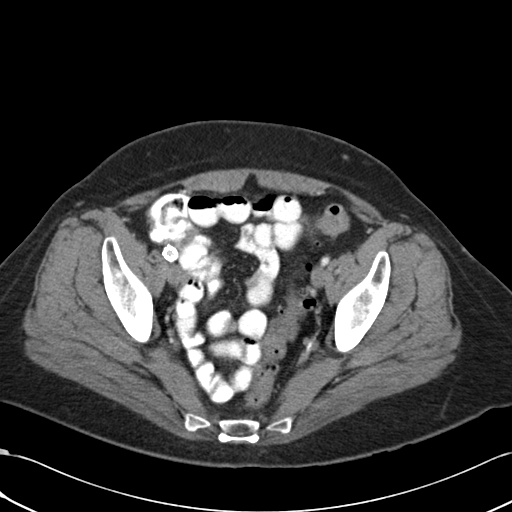
[im 33/89  soft-tissue]
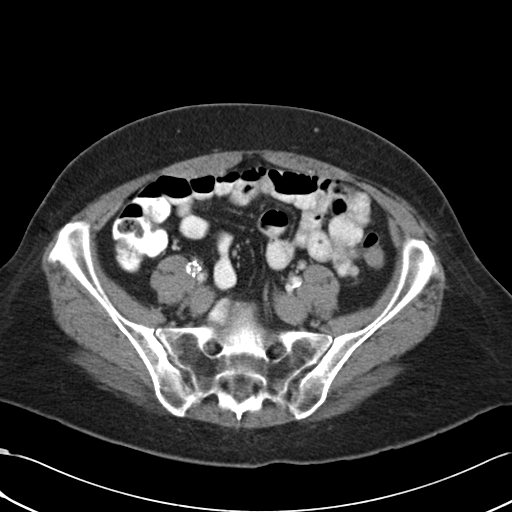
[im 38/89  soft-tissue]
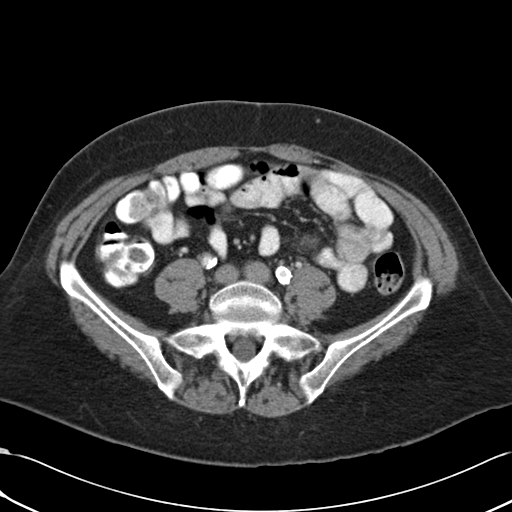
[im 47/89  soft-tissue]
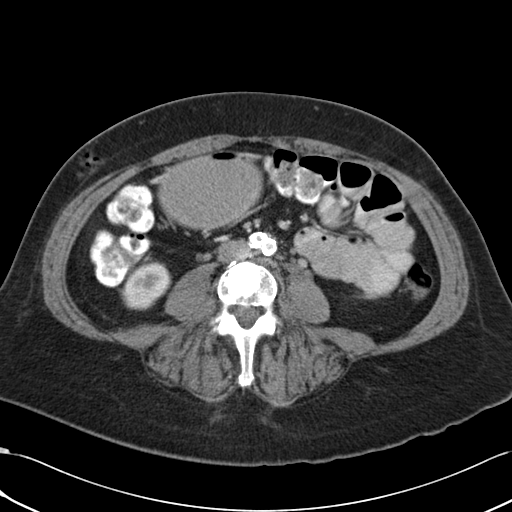
[im 51/89  soft-tissue]
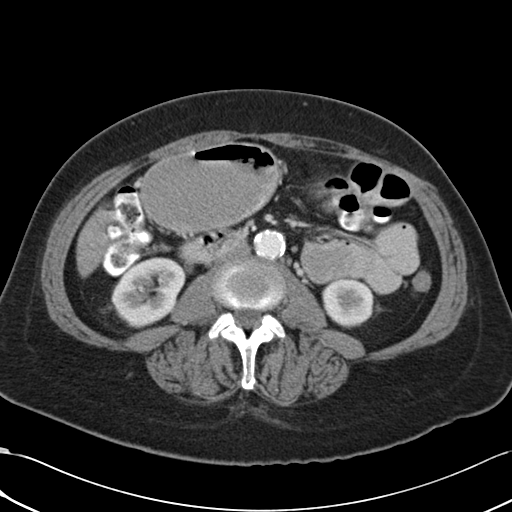
[im 56/89  soft-tissue]
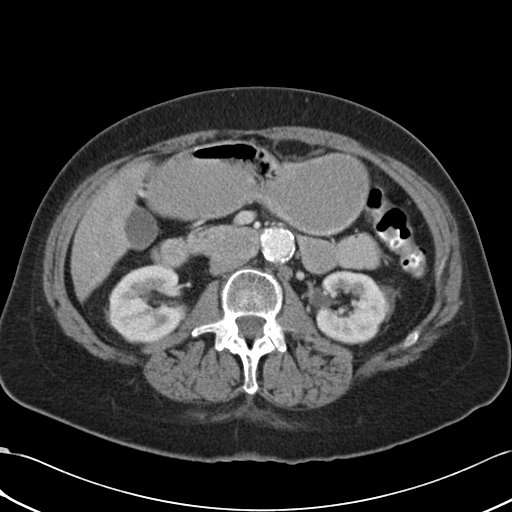
[im 56/89  bone]
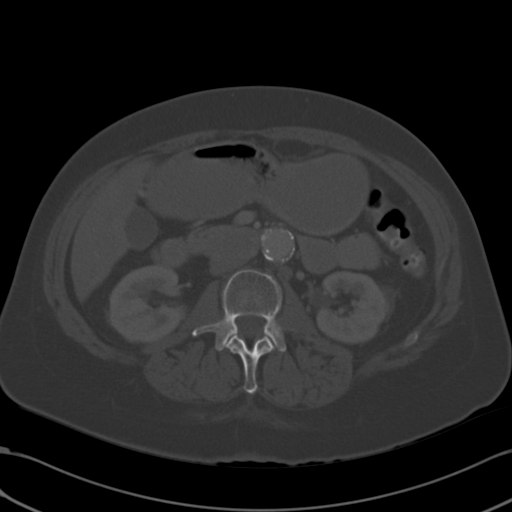
[im 65/89  soft-tissue]
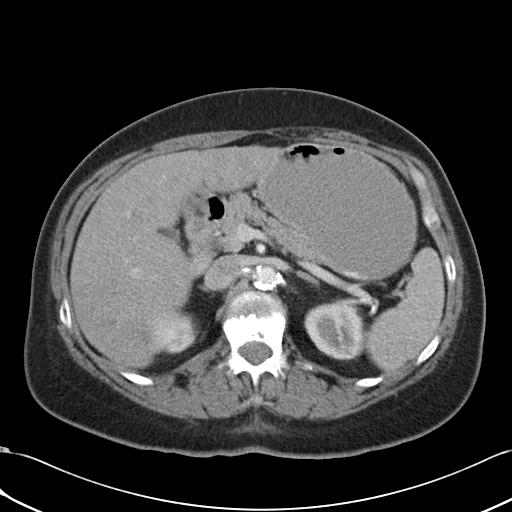
[im 70/89  soft-tissue]
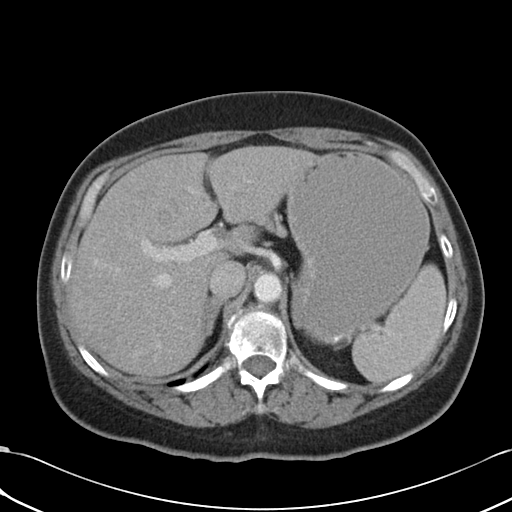
[im 75/89  soft-tissue]
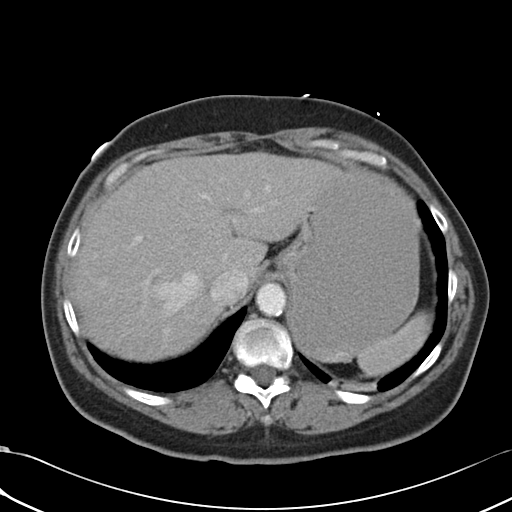
[im 84/89  soft-tissue]
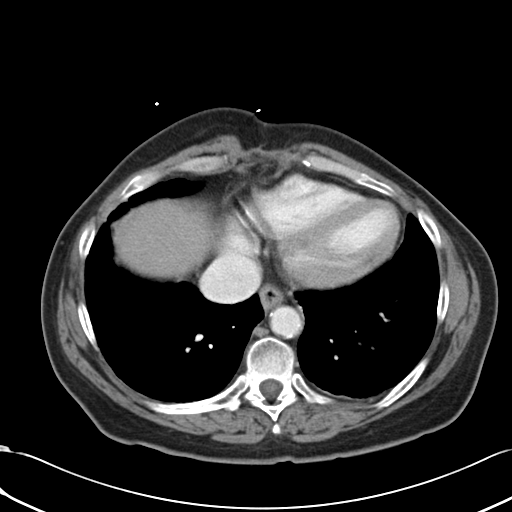

[Series 5: cor routine abd pel with · coronal · 0.73mm/px · 3 of 126 slices shown]
[im 42/126  soft-tissue]
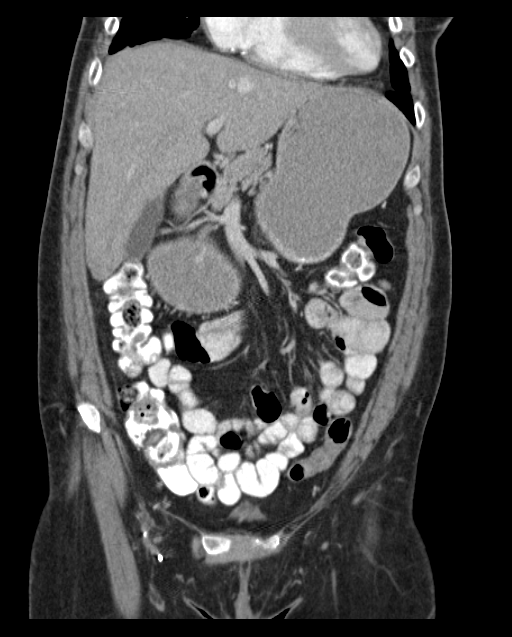
[im 56/126  soft-tissue]
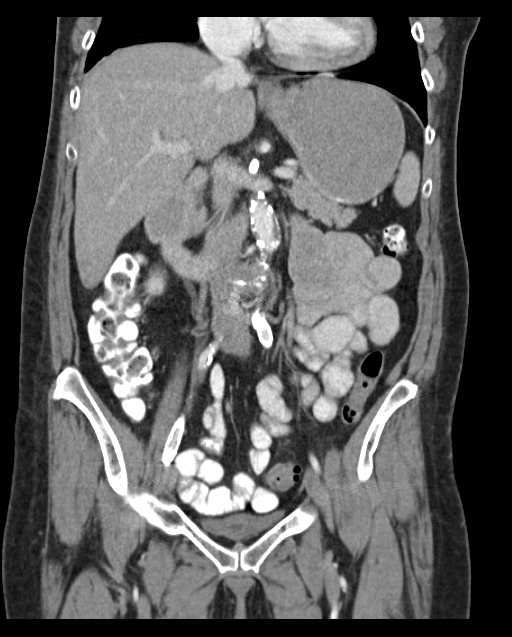
[im 70/126  soft-tissue]
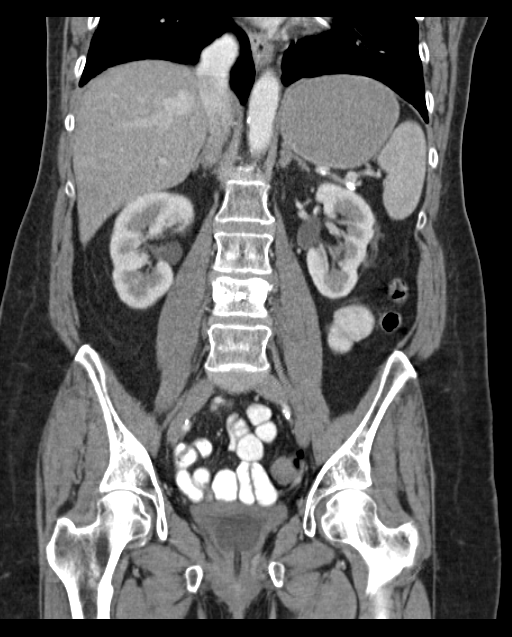

[16 of 46 positions shown; findings below may reference images not displayed]

FINDINGS: Lower chest: Minor dependent basilar atelectasis, worse on the left.
Normal heart size. No pericardial or pleural effusion. Small hiatal
hernia noted. Lower thoracic aorta atherosclerosis noted.

Hepatobiliary: No masses or other significant abnormality.

Pancreas: No mass, inflammatory changes, or other significant
abnormality.

Spleen: Within normal limits in size and appearance.

Adrenals/Urinary Tract: Normal adrenal glands and kidneys for age.
Symmetric enhancement and excretion. No ureteral obstruction or
hydronephrosis. Foley catheter within the decompressed bladder.

Stomach/Bowel: Marked gastric distension, nonspecific. Negative for
bowel obstruction, significant dilatation, ileus, or free air.
Sigmoid diverticulosis noted. No acute inflammatory process.

Vascular/Lymphatic: Extensive aortic and visceral atherosclerotic
disease. SMA and iliac stents present. No vascular occlusive process
appreciated. No retroperitoneal hemorrhage or hematoma. No
adenopathy.

Reproductive: Previous hysterectomy. No pelvic fluid or fluid
collection. No adnexal abnormality.

Other: No inguinal abnormality or hernia.  Intact abdominal wall.

Musculoskeletal: Bones are osteopenic. Previous vertebral
augmentations at L1 and L4. No new compression fracture.
IMPRESSION: No acute intra-abdominal or pelvic process by CT.

Extensive abdominal aortic atherosclerosis and visceral
atherosclerosis.

Sigmoid diverticulosis without acute inflammatory process.
